# Patient Record
Sex: Female | Born: 1950
Health system: Southern US, Community
[De-identification: ages and names within clinical notes are randomized; demographics above are authoritative.]

## PROBLEM LIST (undated history)

## (undated) DIAGNOSIS — M1990 Unspecified osteoarthritis, unspecified site: Secondary | ICD-10-CM

## (undated) DIAGNOSIS — F32A Depression, unspecified: Secondary | ICD-10-CM

## (undated) DIAGNOSIS — R519 Headache, unspecified: Secondary | ICD-10-CM

## (undated) DIAGNOSIS — K219 Gastro-esophageal reflux disease without esophagitis: Secondary | ICD-10-CM

## (undated) DIAGNOSIS — I10 Essential (primary) hypertension: Secondary | ICD-10-CM

## (undated) DIAGNOSIS — K4090 Unilateral inguinal hernia, without obstruction or gangrene, not specified as recurrent: Secondary | ICD-10-CM

## (undated) DIAGNOSIS — F419 Anxiety disorder, unspecified: Secondary | ICD-10-CM

## (undated) DIAGNOSIS — J449 Chronic obstructive pulmonary disease, unspecified: Secondary | ICD-10-CM

## (undated) DIAGNOSIS — J189 Pneumonia, unspecified organism: Secondary | ICD-10-CM

## (undated) DIAGNOSIS — IMO0002 Reserved for concepts with insufficient information to code with codable children: Secondary | ICD-10-CM

## (undated) DIAGNOSIS — L03119 Cellulitis of unspecified part of limb: Secondary | ICD-10-CM

## (undated) DIAGNOSIS — F329 Major depressive disorder, single episode, unspecified: Secondary | ICD-10-CM

## (undated) DIAGNOSIS — K589 Irritable bowel syndrome without diarrhea: Secondary | ICD-10-CM

## (undated) DIAGNOSIS — E785 Hyperlipidemia, unspecified: Secondary | ICD-10-CM

## (undated) DIAGNOSIS — I739 Peripheral vascular disease, unspecified: Secondary | ICD-10-CM

## (undated) HISTORY — DX: Peripheral vascular disease, unspecified: I73.9

## (undated) HISTORY — DX: Reserved for concepts with insufficient information to code with codable children: IMO0002

## (undated) HISTORY — PX: WRIST SURGERY: SHX841

## (undated) HISTORY — DX: Irritable bowel syndrome, unspecified: K58.9

## (undated) HISTORY — DX: Major depressive disorder, single episode, unspecified: F32.9

## (undated) HISTORY — DX: Essential (primary) hypertension: I10

## (undated) HISTORY — PX: JOINT REPLACEMENT: SHX530

## (undated) HISTORY — DX: Hyperlipidemia, unspecified: E78.5

## (undated) HISTORY — PX: APPENDECTOMY: SHX54

## (undated) HISTORY — DX: Cellulitis of unspecified part of limb: L03.119

## (undated) HISTORY — DX: Depression, unspecified: F32.A

## (undated) HISTORY — DX: Anxiety disorder, unspecified: F41.9

---

## 2013-07-24 ENCOUNTER — Encounter: Payer: Self-pay | Admitting: Vascular Surgery

## 2013-08-24 ENCOUNTER — Encounter: Payer: BC Managed Care – PPO | Admitting: Vascular Surgery

## 2013-10-03 ENCOUNTER — Encounter: Payer: Self-pay | Admitting: Vascular Surgery

## 2013-10-04 ENCOUNTER — Encounter: Payer: BC Managed Care – PPO | Admitting: Vascular Surgery

## 2013-11-14 ENCOUNTER — Encounter: Payer: Self-pay | Admitting: Vascular Surgery

## 2013-11-15 ENCOUNTER — Other Ambulatory Visit: Payer: Self-pay

## 2013-11-15 ENCOUNTER — Ambulatory Visit (INDEPENDENT_AMBULATORY_CARE_PROVIDER_SITE_OTHER): Payer: BC Managed Care – PPO | Admitting: Vascular Surgery

## 2013-11-15 ENCOUNTER — Encounter: Payer: Self-pay | Admitting: *Deleted

## 2013-11-15 ENCOUNTER — Encounter (INDEPENDENT_AMBULATORY_CARE_PROVIDER_SITE_OTHER): Payer: Self-pay

## 2013-11-15 ENCOUNTER — Encounter: Payer: Self-pay | Admitting: Vascular Surgery

## 2013-11-15 VITALS — BP 144/59 | HR 64 | Ht 62.0 in | Wt 96.6 lb

## 2013-11-15 DIAGNOSIS — L98499 Non-pressure chronic ulcer of skin of other sites with unspecified severity: Secondary | ICD-10-CM

## 2013-11-15 DIAGNOSIS — I70209 Unspecified atherosclerosis of native arteries of extremities, unspecified extremity: Secondary | ICD-10-CM | POA: Insufficient documentation

## 2013-11-15 DIAGNOSIS — I739 Peripheral vascular disease, unspecified: Secondary | ICD-10-CM

## 2013-11-15 MED ORDER — OXYCODONE-ACETAMINOPHEN 5-325 MG PO TABS: 1.0000 | ORAL_TABLET | ORAL | Status: DC | PRN

## 2013-11-15 NOTE — Progress Notes (Signed)
 V[facility] and [facility] Specialist of [place]  Patient name: Stacy Case MRN: [id number] DOB: 08/17/1951 Sex: female  REASON FOR CONSULT: Nonhealing wounds of both feet. Referred by Dr. Terry Daniel  HPI: Stacy Case is a 62 y.o. female developed wounds on both feet proximally 6 months ago. She does not remember any specific injury to her feet. Under posterior Achilles area on the left, she developed a blister which progressed into a wound. She has been doing dressing changes for this but has a persistent wound which is quite extensive. She's also had a wound on her left medial malleolus and also her right medial malleolus.  She denies any history of claudication. She denies any history of rest pain.  Her risk factors for peripheral vascular disease include tobacco use, hypertension, and hypercholesterolemia.   Past Medical History  Diagnosis Date  . Peripheral arterial disease   . Hypertension   . Anxiety   . Depression   . Hyperlipidemia   . IBS (irritable bowel syndrome)   . Cellulitis of ankle   . Cystocele    Family History  Problem Relation Age of Onset  . Varicose Veins Mother   . Clotting disorder Mother   . Diabetes Brother   . Hypertension Brother   . Diabetes Daughter   . Hyperlipidemia Daughter   . Hypertension Daughter    SOCIAL HISTORY: History  Substance Use Topics  . Smoking status: Current Every Day Smoker -- 1.00 packs/day for 40 years    Types: Cigarettes  . Smokeless tobacco: Never Used  . Alcohol Use: No   Allergies  Allergen Reactions  . Penicillins Rash   Current Outpatient Prescriptions  Medication Sig Dispense Refill  . amLODipine (NORVASC) 5 MG tablet Take 5 mg by mouth daily.      . aspirin 81 MG tablet Take 81 mg by mouth daily.      . atenolol (TENORMIN) 50 MG tablet Take 50 mg by mouth daily.      . cadexomer iodine (IODOSORB) 0.9 % gel Apply 1 application topically daily as needed for wound care.      . lisinopril-hydrochlorothiazide  (PRINZIDE,ZESTORETIC) 20-12.5 MG per tablet Take 1 tablet by mouth daily.      . PARoxetine (PAXIL) 40 MG tablet Take 40 mg by mouth every morning.      . ranitidine (ZANTAC) 150 MG tablet Take 150 mg by mouth 2 (two) times daily.      . Wound Dressings (SAF-GEL) GEL Apply 1 application topically daily.      . loratadine (CLARITIN) 10 MG tablet Take 10 mg by mouth daily.      . lovastatin (MEVACOR) 20 MG tablet Take 20 mg by mouth at bedtime.      . oxyCODONE-acetaminophen (ROXICET) 5-325 MG per tablet Take 1-2 tablets by mouth every 4 (four) hours as needed for severe pain.  30 tablet  0   No current facility-administered medications for this visit.   REVIEW OF SYSTEMS: [X ] denotes positive finding; [  ] denotes negative finding  CARDIOVASCULAR:  [ ] chest pain   [ ] chest pressure   [ ] palpitations   [ ] orthopnea   [X ] dyspnea on exertion   [ ] claudication   [ ] rest pain   [ ] DVT   [ ] phlebitis PULMONARY:   [ ] productive cough   [ ] asthma   [ ] wheezing NEUROLOGIC:   [ ] weakness  [ ] paresthesias  [ ]   aphasia  [ ] amaurosis  [ ] dizziness HEMATOLOGIC:   [ ] bleeding problems   [ ] clotting disorders MUSCULOSKELETAL:  [ ] joint pain   [ ] joint swelling [ ] leg swelling GASTROINTESTINAL: [ ]  blood in stool  [ ]  hematemesis GENITOURINARY:  [ ]  dysuria  [ ]  hematuria PSYCHIATRIC:  [ ] history of major depression INTEGUMENTARY:  [ ] rashes  [X ] ulcers CONSTITUTIONAL:  [ ] fever   [ ] chills  PHYSICAL EXAM: Filed Vitals:   11/15/13 0911  BP: 144/59  Pulse: 64  Height: 5' 2" (1.575 m)  Weight: 96 lb 9.6 oz (43.817 kg)  SpO2: 96%   Body mass index is 17.66 kg/(m^2). GENERAL: The patient is a well-nourished female, in no acute distress. The vital signs are documented above. CARDIOVASCULAR: There is a regular rate and rhythm. I do not detect carotid bruits. She has palpable radial pulses bilaterally. She has palpable femoral pulses bilaterally. She has a palpable left  dorsalis pedis pulse. I cannot palpate a right dorsalis pedis pulse. I cannot palpate posterior tibial pulses. She has no significant lower extremity swelling. She has biphasic dorsalis pedis signals bilaterally. She has monophasic posterior tibial signals bilaterally. PULMONARY: There is good air exchange bilaterally without wheezing or rales. I do not appreciate an abdominal aortic aneurysm. She has a right lower quadrant hernia. ABDOMEN: Soft and non-tender with normal pitched bowel sounds.  MUSCULOSKELETAL: There are no major deformities or cyanosis. NEUROLOGIC: No focal weakness or paresthesias are detected. SKIN: she has a wound on her left Achilles with exposed tendon it measures 6 cm in width by 13 cm in length. The wound on her left medial malleolus is to a half centimeters in diameter. The wound on her right medial malleolus is 1 cm in diameter. PSYCHIATRIC: The patient has a normal affect.  DATA:  I have reviewed her records from the referring physician's office. She did have an arterial Doppler study on 07/13/2013 which show a mild PVD bilaterally. She was noted to have disease in the left posterior tibial artery that was fairly severe.  I have reviewed her records from [facility]. She has a history of depression, hyperlipidemia, hypertension, irritable bowel syndrome, bronchitis, right inguinal hernia, mixed hyperlipidemia. I do see cholesterol labs from August which showed total cholesterol 205 with an LDL of 97. HDL was 34. Her creatinine is 0.81. Her GFR was 78.  MEDICAL ISSUES:  Atherosclerotic peripheral vascular disease with ulceration This patient has extensive wounds of the left leg especially and also a smaller wound on the right medial malleolus. Based on her exam she has evidence of infrainguinal arterial occlusive disease. She has severe disease in the left posterior tibial artery as noted on a previous Doppler study. This is the artery that supplies the area of  her medial malleolar wound and Achilles area. Therefore, even know she has a palpable dorsalis pedis pulse on the left I think her infrainguinal arterial occlusive disease could be affecting her healing.  I've explained that this is a limb threatening situation given the extent of the wound. We had a long discussion about the importance of tobacco cessation and I've explained how this causes vasospasm of the microcirculation and also affects white blood cell function. Given her the number for [place] to free tobacco cessation program. She'll continue her dressing changes to try to keep the wound a moist. I've recommended we proceed with an arteriogram in   order to further evaluate her infrainguinal arterial occlusive disease. I have reviewed with the patient the indications for arteriography. In addition, I have reviewed the potential complications of arteriography including but not limited to: Bleeding, arterial injury, arterial thrombosis, dye action, renal insufficiency, or other unpredictable medical problems. I have explained to the patient that if we find disease amenable to angioplasty we could potentially address this at the same time. I have discussed the potential complications of angioplasty and stenting, including but not limited to: Bleeding, arterial thrombosis, arterial injury, dissection, or the need for surgical intervention. Her arteriogram is scheduled for 11/20/2013. We will make further recommendations pending these results. Of note I have also given her a prescription for 30 Percocet for pain.   Bricia Taher S [facility] Specialists of [place] [facility]: [phone]    

## 2013-11-15 NOTE — Assessment & Plan Note (Signed)
This patient has extensive wounds of the left leg especially and also a smaller wound on the right medial malleolus. Based on her exam she has evidence of infrainguinal arterial occlusive disease. She has severe disease in the left posterior tibial artery as noted on a previous Doppler study. This is the artery that supplies the area of her medial malleolar wound and Achilles area. Therefore, even know she has a palpable dorsalis pedis pulse on the left I think her infrainguinal arterial occlusive disease could be affecting her healing.  I've explained that this is a limb threatening situation given the extent of the wound. We had a long discussion about the importance of tobacco cessation and I've explained how this causes vasospasm of the microcirculation and also affects white blood cell function. Given her the number for [place] to free tobacco cessation program. She'll continue her dressing changes to try to keep the wound a moist. I've recommended we proceed with an arteriogram in order to further evaluate her infrainguinal arterial occlusive disease. I have reviewed with the patient the indications for arteriography. In addition, I have reviewed the potential complications of arteriography including but not limited to: Bleeding, arterial injury, arterial thrombosis, dye action, renal insufficiency, or other unpredictable medical problems. I have explained to the patient that if we find disease amenable to angioplasty we could potentially address this at the same time. I have discussed the potential complications of angioplasty and stenting, including but not limited to: Bleeding, arterial thrombosis, arterial injury, dissection, or the need for surgical intervention. Her arteriogram is scheduled for 11/20/2013. We will make further recommendations pending these results. Of note I have also given her a prescription for 30 Percocet for pain.

## 2013-11-20 ENCOUNTER — Ambulatory Visit (HOSPITAL_COMMUNITY)
Admission: RE | Admit: 2013-11-20 | Discharge: 2013-11-20 | Disposition: A | Discharge status: Home / Self Care | Payer: BC Managed Care – PPO | Source: Ambulatory Visit | Attending: Vascular Surgery | Admitting: Vascular Surgery

## 2013-11-20 ENCOUNTER — Telehealth: Payer: Self-pay | Admitting: Vascular Surgery

## 2013-11-20 ENCOUNTER — Encounter: Payer: Self-pay | Admitting: Family Medicine

## 2013-11-20 ENCOUNTER — Encounter (HOSPITAL_COMMUNITY)
Admission: RE | Disposition: A | Discharge status: Home / Self Care | Payer: Self-pay | Source: Ambulatory Visit | Attending: Vascular Surgery

## 2013-11-20 DIAGNOSIS — L98499 Non-pressure chronic ulcer of skin of other sites with unspecified severity: Secondary | ICD-10-CM

## 2013-11-20 DIAGNOSIS — I739 Peripheral vascular disease, unspecified: Secondary | ICD-10-CM

## 2013-11-20 DIAGNOSIS — K589 Irritable bowel syndrome without diarrhea: Secondary | ICD-10-CM | POA: Insufficient documentation

## 2013-11-20 DIAGNOSIS — I10 Essential (primary) hypertension: Secondary | ICD-10-CM | POA: Insufficient documentation

## 2013-11-20 DIAGNOSIS — F329 Major depressive disorder, single episode, unspecified: Secondary | ICD-10-CM | POA: Insufficient documentation

## 2013-11-20 DIAGNOSIS — E7800 Pure hypercholesterolemia, unspecified: Secondary | ICD-10-CM | POA: Insufficient documentation

## 2013-11-20 DIAGNOSIS — E782 Mixed hyperlipidemia: Secondary | ICD-10-CM | POA: Insufficient documentation

## 2013-11-20 DIAGNOSIS — L97409 Non-pressure chronic ulcer of unspecified heel and midfoot with unspecified severity: Secondary | ICD-10-CM | POA: Insufficient documentation

## 2013-11-20 DIAGNOSIS — Z7982 Long term (current) use of aspirin: Secondary | ICD-10-CM | POA: Insufficient documentation

## 2013-11-20 DIAGNOSIS — F3289 Other specified depressive episodes: Secondary | ICD-10-CM | POA: Insufficient documentation

## 2013-11-20 DIAGNOSIS — F411 Generalized anxiety disorder: Secondary | ICD-10-CM | POA: Insufficient documentation

## 2013-11-20 DIAGNOSIS — F17200 Nicotine dependence, unspecified, uncomplicated: Secondary | ICD-10-CM | POA: Insufficient documentation

## 2013-11-20 HISTORY — PX: ABDOMINAL AORTAGRAM: SHX5454

## 2013-11-20 LAB — POCT I-STAT, CHEM 8
BUN: 35 mg/dL — AB (ref 6–23)
CALCIUM ION: 1.19 mmol/L (ref 1.13–1.30)
CHLORIDE: 107 meq/L (ref 96–112)
Creatinine, Ser: 1 mg/dL (ref 0.50–1.10)
GLUCOSE: 108 mg/dL — AB (ref 70–99)
HCT: 34 % — ABNORMAL LOW (ref 36.0–46.0)
Hemoglobin: 11.6 g/dL — ABNORMAL LOW (ref 12.0–15.0)
Potassium: 3.3 mEq/L — ABNORMAL LOW (ref 3.7–5.3)
Sodium: 142 mEq/L (ref 137–147)
TCO2: 22 mmol/L (ref 0–100)

## 2013-11-20 SURGERY — ABDOMINAL AORTAGRAM
Anesthesia: LOCAL

## 2013-11-20 MED ORDER — HEPARIN (PORCINE) IN NACL 2-0.9 UNIT/ML-% IJ SOLN
INTRAMUSCULAR | Status: AC
  Filled 2013-11-20: qty 500

## 2013-11-20 MED ORDER — SODIUM CHLORIDE 0.9 % IV SOLN: 1.0000 mL/kg/h | INTRAVENOUS | Status: DC

## 2013-11-20 MED ORDER — ACETAMINOPHEN 325 MG PO TABS: 650.0000 mg | ORAL_TABLET | ORAL | Status: DC | PRN

## 2013-11-20 MED ORDER — LIDOCAINE HCL (PF) 1 % IJ SOLN
INTRAMUSCULAR | Status: AC
  Filled 2013-11-20: qty 30

## 2013-11-20 MED ORDER — MIDAZOLAM HCL 2 MG/2ML IJ SOLN
INTRAMUSCULAR | Status: AC
  Filled 2013-11-20: qty 2

## 2013-11-20 MED ORDER — POTASSIUM CHLORIDE CRYS ER 20 MEQ PO TBCR
20.0000 meq | EXTENDED_RELEASE_TABLET | Freq: Once | ORAL | Status: AC
  Administered 2013-11-20: 20 meq via ORAL
  Filled 2013-11-20: qty 1

## 2013-11-20 MED ORDER — ONDANSETRON HCL 4 MG/2ML IJ SOLN: 4.0000 mg | Freq: Four times a day (QID) | INTRAMUSCULAR | Status: DC | PRN

## 2013-11-20 MED ORDER — SODIUM CHLORIDE 0.9 % IV SOLN
INTRAVENOUS | Status: DC
  Administered 2013-11-20: 100 mL/h via INTRAVENOUS

## 2013-11-20 MED ORDER — FENTANYL CITRATE 0.05 MG/ML IJ SOLN
INTRAMUSCULAR | Status: AC
  Filled 2013-11-20: qty 2

## 2013-11-20 NOTE — Discharge Instructions (Signed)
Groin Site Care °Refer to this sheet in the next few weeks. These instructions provide you with information on caring for yourself after your procedure. Your caregiver may also give you more specific instructions. Your treatment has been planned according to current medical practices, but problems sometimes occur. Call your caregiver if you have any problems or questions after your procedure. °HOME CARE INSTRUCTIONS °· You may shower 24 hours after the procedure. Remove the bandage (dressing) and gently wash the site with plain soap and water. Gently pat the site dry. °· Do not apply powder or lotion to the site. °· Do not sit in a bathtub, swimming pool, or whirlpool for 5 to 7 days. °· No bending, squatting, or lifting anything over 10 pounds (4.5 kg) as directed by your caregiver. °· Inspect the site at least twice daily. °· Do not drive home if you are discharged the same day of the procedure. Have someone else drive you. °· You may drive 24 hours after the procedure unless otherwise instructed by your caregiver. °What to expect: °· Any bruising will usually fade within 1 to 2 weeks. °· Blood that collects in the tissue (hematoma) may be painful to the touch. It should usually decrease in size and tenderness within 1 to 2 weeks. °SEEK IMMEDIATE MEDICAL CARE IF: °· You have unusual pain at the groin site or down the affected leg. °· You have redness, warmth, swelling, or pain at the groin site. °· You have drainage (other than a small amount of blood on the dressing). °· You have chills. °· You have a fever or persistent symptoms for more than 72 hours. °· You have a fever and your symptoms suddenly get worse. °· Your leg becomes pale, cool, tingly, or numb. °· You have heavy bleeding from the site. Hold pressure on the site. °Document Released: 12/05/2010 Document Revised: 01/25/2012 Document Reviewed: 12/05/2010 °ExitCare® Patient Information ©2014 [employer]. ° °

## 2013-11-20 NOTE — Telephone Encounter (Addendum)
Message copied by Gena Fray on Mon Nov 20, 2013  3:43 PM ------      Message from: Alfonso Patten      Created: Mon Nov 20, 2013 11:26 AM      Regarding: FW: charge and f/u                   ----- Message -----         From: Angelia Mould, MD         Sent: 11/20/2013  11:12 AM           To: Patrici Ranks, Alfonso Patten, RN, #      Subject: charge and f/u                                           PROCEDURE:       1. Ultrasound-guided access to the right common femoral artery      2. Aortogram with bilateral iliac arteriogram      3. Selective catheterization of the left external iliac artery with left lower extremity runoff      4. Right femoral arteriogram with right lower extremity runoff            SURGEON: Judeth Cornfield. Scot Dock, MD, FACS            She is to follow up in approximately 3 weeks to follow her left leg wound. Thank you. CD ------  11/20/13- spoke with Leafy Ro to schedule, dpm

## 2013-11-20 NOTE — Op Note (Signed)
   PATIENT: Stacy Case   MRN: [id number] DOB: 1951-07-03    DATE OF PROCEDURE: 11/20/2013  INDICATIONS: Mignon Bechler is a 63 y.o. female presents with an extensive wound of her left Achilles area. She had evidence of posterior tibial artery disease on duplex. She presents for an arteriogram.  PROCEDURE:  1. Ultrasound-guided access to the right common femoral artery 2. Aortogram with bilateral iliac arteriogram 3. Selective catheterization of the left external iliac artery with left lower extremity runoff 4. Right femoral arteriogram with right lower extremity runoff  SURGEON: Judeth Cornfield. Scot Dock, MD, FACS  ANESTHESIA: local with sedation   EBL: minimal   TECHNIQUE: Patient was taken to the peripheral vascular lab and received 25 mcg of fentanyl and a half milligram of Versed. The right groin was prepped and draped in usual sterile fashion. Under ultrasound guidance, after the skin was anesthetized, the right common femoral artery was cannulated and a guidewire introduced into the iliac artery under fluoroscopic control. The micropuncture sheath was exchanged for a 5 [place] sheath over a [employer] wire. The pigtail catheter was positioned at the L1 vertebral body and flush aortogram obtained. The catheter was in position above the aortic bifurcation and oblique iliac projection was obtained. Next the pigtail catheter was exchanged for a crossover catheter which was positioned into the left common iliac artery. An angled Glidewire was advanced into the external iliac artery and the crossover catheter exchange for an endhole catheter. Selective left external iliac arteriogram was obtained with left lower extremity runoff. This catheter was then removed in a retrograde right femoral arteriogram was obtained with right lower extremity runoff. The patient was transferred to the holding area for removal of the sheath.  FINDINGS:  1. There are single renal arteries bilaterally with no significant renal  artery stenosis identified. 2. The infrarenal aorta, bilateral common iliac arteries, bilateral external iliac arteries, and bilateral hypogastric arteries are patent. 3. On the left side, the common femoral, superficial femoral, deep femoral, popliteal, anterior tibial, tibial peroneal trunk, and peroneal arteries are open. The posterior tibial artery on the left is occluded. 4. On the right side, the common femoral, superficial femoral, deep femoral, popliteal, anterior tibial, tibial peroneal trunk and peroneal arteries are patent. The peroneal artery does have some moderate diffuse disease in it's midportion. The right posterior tibial artery is occluded.  Deitra Mayo, MD, FACS [facility]ascular and Vein Specialists of [facility]  DATE OF DICTATION:   11/20/2013

## 2013-11-20 NOTE — Progress Notes (Signed)
DR FIELDS NOTIFIED OF B/P AND NO NEW ORDERS NOTED

## 2013-11-20 NOTE — Interval H&P Note (Signed)
History and Physical Interval Note:  11/20/2013 9:06 AM  Stacy Case  has presented today for surgery, with the diagnosis of PVD  The various methods of treatment have been discussed with the patient and family. After consideration of risks, benefits and other options for treatment, the patient has consented to  Procedure(s): ABDOMINAL AORTAGRAM (N/A) as a surgical intervention .  The patient's history has been reviewed, patient examined, no change in status, stable for surgery.  I have reviewed the patient's chart and labs.  Questions were answered to the patient's satisfaction.     [place] Decoteau S

## 2013-11-20 NOTE — H&P (View-Only) (Signed)
Vascular and Vein Specialist of [facility]  Patient name: Stacy Case MRN: [id number] DOB: 10-24-1951 Sex: female  REASON FOR CONSULT: Nonhealing wounds of both feet. Referred by Dr. Gar Ponto  HPI: Stacy Case is a 63 y.o. female developed wounds on both feet proximally 6 months ago. She does not remember any specific injury to her feet. Under posterior Achilles area on the left, she developed a blister which progressed into a wound. She has been doing dressing changes for this but has a persistent wound which is quite extensive. She's also had a wound on her left medial malleolus and also her right medial malleolus.  She denies any history of claudication. She denies any history of rest pain.  Her risk factors for peripheral vascular disease include tobacco use, hypertension, and hypercholesterolemia.   Past Medical History  Diagnosis Date  . Peripheral arterial disease   . Hypertension   . Anxiety   . Depression   . Hyperlipidemia   . IBS (irritable bowel syndrome)   . Cellulitis of ankle   . Cystocele    Family History  Problem Relation Age of Onset  . Varicose Veins Mother   . Clotting disorder Mother   . Diabetes Brother   . Hypertension Brother   . Diabetes Daughter   . Hyperlipidemia Daughter   . Hypertension Daughter    SOCIAL HISTORY: History  Substance Use Topics  . Smoking status: Current Every Day Smoker -- 1.00 packs/day for 40 years    Types: Cigarettes  . Smokeless tobacco: Never Used  . Alcohol Use: No   Allergies  Allergen Reactions  . Penicillins Rash   Current Outpatient Prescriptions  Medication Sig Dispense Refill  . amLODipine (NORVASC) 5 MG tablet Take 5 mg by mouth daily.      Marland Kitchen aspirin 81 MG tablet Take 81 mg by mouth daily.      Marland Kitchen atenolol (TENORMIN) 50 MG tablet Take 50 mg by mouth daily.      . cadexomer iodine (IODOSORB) 0.9 % gel Apply 1 application topically daily as needed for wound care.      Marland Kitchen lisinopril-hydrochlorothiazide  (PRINZIDE,ZESTORETIC) 20-12.5 MG per tablet Take 1 tablet by mouth daily.      Marland Kitchen PARoxetine (PAXIL) 40 MG tablet Take 40 mg by mouth every morning.      . ranitidine (ZANTAC) 150 MG tablet Take 150 mg by mouth 2 (two) times daily.      . Wound Dressings (SAF-GEL) GEL Apply 1 application topically daily.      Marland Kitchen loratadine (CLARITIN) 10 MG tablet Take 10 mg by mouth daily.      Marland Kitchen lovastatin (MEVACOR) 20 MG tablet Take 20 mg by mouth at bedtime.      Marland Kitchen oxyCODONE-acetaminophen (ROXICET) 5-325 MG per tablet Take 1-2 tablets by mouth every 4 (four) hours as needed for severe pain.  30 tablet  0   No current facility-administered medications for this visit.   REVIEW OF SYSTEMS: Valu.Nieves ] denotes positive finding; [  ] denotes negative finding  CARDIOVASCULAR:  [ ]  chest pain   [ ]  chest pressure   [ ]  palpitations   [ ]  orthopnea   Valu.Nieves ] dyspnea on exertion   [ ]  claudication   [ ]  rest pain   [ ]  DVT   [ ]  phlebitis PULMONARY:   [ ]  productive cough   [ ]  asthma   [ ]  wheezing NEUROLOGIC:   [ ]  weakness  [ ]  paresthesias  [ ]   aphasia  [ ]  amaurosis  [ ]  dizziness HEMATOLOGIC:   [ ]  bleeding problems   [ ]  clotting disorders MUSCULOSKELETAL:  [ ]  joint pain   [ ]  joint swelling [ ]  leg swelling GASTROINTESTINAL: [ ]   blood in stool  [ ]   hematemesis GENITOURINARY:  [ ]   dysuria  [ ]   hematuria PSYCHIATRIC:  [ ]  history of major depression INTEGUMENTARY:  [ ]  rashes  Valu.Nieves ] ulcers CONSTITUTIONAL:  [ ]  fever   [ ]  chills  PHYSICAL EXAM: Filed Vitals:   11/15/13 0911  BP: 144/59  Pulse: 64  Height: 5\' 2"  (1.575 m)  Weight: 96 lb 9.6 oz (43.817 kg)  SpO2: 96%   Body mass index is 17.66 kg/(m^2). GENERAL: The patient is a well-nourished female, in no acute distress. The vital signs are documented above. CARDIOVASCULAR: There is a regular rate and rhythm. I do not detect carotid bruits. She has palpable radial pulses bilaterally. She has palpable femoral pulses bilaterally. She has a palpable left  dorsalis pedis pulse. I cannot palpate a right dorsalis pedis pulse. I cannot palpate posterior tibial pulses. She has no significant lower extremity swelling. She has biphasic dorsalis pedis signals bilaterally. She has monophasic posterior tibial signals bilaterally. PULMONARY: There is good air exchange bilaterally without wheezing or rales. I do not appreciate an abdominal aortic aneurysm. She has a right lower quadrant hernia. ABDOMEN: Soft and non-tender with normal pitched bowel sounds.  MUSCULOSKELETAL: There are no major deformities or cyanosis. NEUROLOGIC: No focal weakness or paresthesias are detected. SKIN: she has a wound on her left Achilles with exposed tendon it measures 6 cm in width by 13 cm in length. The wound on her left medial malleolus is to a half centimeters in diameter. The wound on her right medial malleolus is 1 cm in diameter. PSYCHIATRIC: The patient has a normal affect.  DATA:  I have reviewed her records from the referring physician's office. She did have an arterial Doppler study on 07/13/2013 which show a mild PVD bilaterally. She was noted to have disease in the left posterior tibial artery that was fairly severe.  I have reviewed her records from [place]. She has a history of depression, hyperlipidemia, hypertension, irritable bowel syndrome, bronchitis, right inguinal hernia, mixed hyperlipidemia. I do see cholesterol labs from August which showed total cholesterol 205 with an LDL of 97. HDL was 34. Her creatinine is 0.81. Her GFR was 78.  MEDICAL ISSUES:  Atherosclerotic peripheral vascular disease with ulceration This patient has extensive wounds of the left leg especially and also a smaller wound on the right medial malleolus. Based on her exam she has evidence of infrainguinal arterial occlusive disease. She has severe disease in the left posterior tibial artery as noted on a previous Doppler study. This is the artery that supplies the area of  her medial malleolar wound and Achilles area. Therefore, even know she has a palpable dorsalis pedis pulse on the left I think her infrainguinal arterial occlusive disease could be affecting her healing.  I've explained that this is a limb threatening situation given the extent of the wound. We had a long discussion about the importance of tobacco cessation and I've explained how this causes vasospasm of the microcirculation and also affects white blood cell function. Given her the number for [place] to free tobacco cessation program. She'll continue her dressing changes to try to keep the wound a moist. I've recommended we proceed with an arteriogram in  order to further evaluate her infrainguinal arterial occlusive disease. I have reviewed with the patient the indications for arteriography. In addition, I have reviewed the potential complications of arteriography including but not limited to: Bleeding, arterial injury, arterial thrombosis, dye action, renal insufficiency, or other unpredictable medical problems. I have explained to the patient that if we find disease amenable to angioplasty we could potentially address this at the same time. I have discussed the potential complications of angioplasty and stenting, including but not limited to: Bleeding, arterial thrombosis, arterial injury, dissection, or the need for surgical intervention. Her arteriogram is scheduled for 11/20/2013. We will make further recommendations pending these results. Of note I have also given her a prescription for 30 Percocet for pain.   [place] [facility] of  [place]: [phone]

## 2013-11-20 NOTE — Progress Notes (Signed)
UP AND WALKED AND TOL WELL AND RIGHT GROIN STABLE; NO BLEEDING OR HEMATOMA

## 2013-12-12 ENCOUNTER — Encounter: Payer: Self-pay | Admitting: Vascular Surgery

## 2013-12-13 ENCOUNTER — Ambulatory Visit (INDEPENDENT_AMBULATORY_CARE_PROVIDER_SITE_OTHER): Payer: BC Managed Care – PPO | Admitting: Vascular Surgery

## 2013-12-13 ENCOUNTER — Encounter: Payer: Self-pay | Admitting: Vascular Surgery

## 2013-12-13 VITALS — BP 132/59 | HR 64 | Ht 62.0 in | Wt 93.8 lb

## 2013-12-13 DIAGNOSIS — L98499 Non-pressure chronic ulcer of skin of other sites with unspecified severity: Secondary | ICD-10-CM

## 2013-12-13 DIAGNOSIS — I739 Peripheral vascular disease, unspecified: Secondary | ICD-10-CM

## 2013-12-13 DIAGNOSIS — I70209 Unspecified atherosclerosis of native arteries of extremities, unspecified extremity: Secondary | ICD-10-CM

## 2013-12-13 MED ORDER — OXYCODONE-ACETAMINOPHEN 5-325 MG PO TABS: 1.0000 | ORAL_TABLET | ORAL | Status: DC | PRN

## 2013-12-13 NOTE — Progress Notes (Signed)
   Patient name: Stacy Case MRN: [id number] DOB: [birth year] Sex: female  REASON FOR VISIT: Follow up of left leg wound.  HPI: Stacy Case is a 63 y.o. female who presented with an extensive wound on her left Achilles area. 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 She underwent an arteriogram on 11/20/2013. She comes in for a follow up visit.  Since I saw her last, she states that the wound has improved significantly. She's doing the dressing changes herself. She does have some pain associated with the wound. She does continue to smoke a half pack per day.  He denies fever or chills.  REVIEW OF SYSTEMS: Valu.Nieves ] denotes positive finding; [  ] denotes negative finding  CARDIOVASCULAR:  [ ]  chest pain   [ ]  dyspnea on exertion    CONSTITUTIONAL:  [ ]  fever   [ ]  chills  PHYSICAL EXAM: Filed Vitals:   12/13/13 1043  BP: 132/59  Pulse: 64  Height: 5\' 2"  (1.575 m)  Weight: 93 lb 12.8 oz (42.547 kg)  SpO2: 100%   Body mass index is 17.15 kg/(m^2). GENERAL: The patient is a well-nourished female, in no acute distress. The vital signs are documented above. CARDIOVASCULAR: There is a regular rate and rhythm. She has a left dorsalis pedis pulse. There is no posterior tibial pulse. PULMONARY: There is good air exchange bilaterally without wheezing or rales. The wound on her left Achilles measures 13 cm in length by 5 cm in width. The wound on the medial malleolus measures 2 cm in diameter and appears to have epithelialized.  ARTERIOGRAPHY: Her arteriogram shows that the infrarenal aorta, bilateral common iliac arteries, bilateral external iliac arteries are widely patent. On the left side, which is asymptomatic side, the common femoral, superficial femoral, deep femoral, popliteal, anterior tibial, tibial peroneal trunk, and peroneal arteries are open. Posterior tibial artery is occluded.  MEDICAL ISSUES:  Atherosclerotic peripheral vascular disease with ulceration The  wound on her left Achilles was 6 cm in width and is now 5 cm in width. This is making progress. Based on her arteriogram, she has good perfusion although the posterior tibial artery is occluded. However there is nothing to offer to address this from a vascular standpoint. Her anterior tibial artery is patent as is her peroneal artery. I've instructed her to do the dressing changes with hydrogel to keep the wound moist. In addition we have discussed the importance of getting off tobacco completely. I'll see her back in 3 months. She knows to call sooner if she has problems.   In addition, I have given her a prescription for 30 oxycodone for pain.   [facility] [place] Beeper: [phone]

## 2013-12-13 NOTE — Assessment & Plan Note (Signed)
The wound on her left Achilles was 6 cm in width and is now 5 cm in width. This is making progress. Based on her arteriogram, she has good perfusion although the posterior tibial artery is occluded. However there is nothing to offer to address this from a vascular standpoint. Her anterior tibial artery is patent as is her peroneal artery. I've instructed her to do the dressing changes with hydrogel to keep the wound moist. In addition we have discussed the importance of getting off tobacco completely. I'll see her back in 3 months. She knows to call sooner if she has problems.

## 2014-03-13 ENCOUNTER — Encounter: Payer: Self-pay | Admitting: Vascular Surgery

## 2014-03-14 ENCOUNTER — Ambulatory Visit (INDEPENDENT_AMBULATORY_CARE_PROVIDER_SITE_OTHER): Payer: BC Managed Care – PPO | Admitting: Vascular Surgery

## 2014-03-14 ENCOUNTER — Encounter: Payer: Self-pay | Admitting: Vascular Surgery

## 2014-03-14 VITALS — BP 158/65 | HR 65 | Ht 62.0 in | Wt 102.0 lb

## 2014-03-14 DIAGNOSIS — I739 Peripheral vascular disease, unspecified: Secondary | ICD-10-CM

## 2014-03-14 DIAGNOSIS — I70209 Unspecified atherosclerosis of native arteries of extremities, unspecified extremity: Secondary | ICD-10-CM

## 2014-03-14 DIAGNOSIS — L98499 Non-pressure chronic ulcer of skin of other sites with unspecified severity: Secondary | ICD-10-CM

## 2014-03-14 NOTE — Addendum Note (Signed)
Addended by: Mena Goes on: 03/14/2014 10:40 AM   Modules accepted: Orders

## 2014-03-14 NOTE — Progress Notes (Signed)
[facility]  Patient name: Stacy Case MRN: [id number] DOB: [birth year] Sex: female  REASON FOR VISIT: Follow up of left leg wound.  HPI: Stacy Case is a 62 y.o. female who presented with an extensive wound on her left Achilles area. She underwent an arteriogram on 11/20/2013. Her arteriogram shows that the infrarenal aorta, bilateral common iliac arteries, bilateral external iliac arteries are widely patent. On the left side, which is asymptomatic side, the common femoral, superficial femoral, deep femoral, popliteal, anterior tibial, tibial peroneal trunk, and peroneal arteries are open. Posterior tibial artery is occluded.  I last saw her on 12/13/2013. At that time the wound on her left Achilles has shrunk from 6 cm in width to 5 cm in width. The wound was making progress. There was nothing to do from a vascular standpoint based on her arteriogram. She comes in for a 3 month follow up visit.  Since saw her last, the wound has continued to gradually improve. She denies fever or chills. She continues to smoke a pack per day of cigarettes. She has developed some blisters on her right leg.  Past Medical History  Diagnosis Date  . Peripheral arterial disease   . Hypertension   . Anxiety   . Depression   . Hyperlipidemia   . IBS (irritable bowel syndrome)   . Cellulitis of ankle   . Cystocele    Family History  Problem Relation Age of Onset  . Varicose Veins Mother   . Clotting disorder Mother   . Diabetes Brother   . Hypertension Brother   . Diabetes Daughter   . Hyperlipidemia Daughter   . Hypertension Daughter    SOCIAL HISTORY: History  Substance Use Topics  . Smoking status: Current Every Day Smoker -- 1.00 packs/day for 40 years    Types: Cigarettes  . Smokeless tobacco: Never Used  . Alcohol Use: No   Allergies  Allergen Reactions  . Penicillins Rash   Current Outpatient Prescriptions  Medication Sig Dispense Refill  . amLODipine  (NORVASC) 5 MG tablet Take 5 mg by mouth daily.      Marland Kitchen aspirin 81 MG tablet Take 81 mg by mouth daily.      Marland Kitchen aspirin-acetaminophen-caffeine (EXCEDRIN MIGRAINE) 250-250-65 MG per tablet Take 1 tablet by mouth every 6 (six) hours as needed for headache.      Marland Kitchen atenolol (TENORMIN) 50 MG tablet Take 50 mg by mouth daily.      . cadexomer iodine (IODOSORB) 0.9 % gel Apply 1 application topically daily as needed for wound care.      Marland Kitchen lisinopril-hydrochlorothiazide (PRINZIDE,ZESTORETIC) 20-12.5 MG per tablet Take 1 tablet by mouth daily.      Marland Kitchen loratadine (CLARITIN) 10 MG tablet Take 10 mg by mouth daily.      Marland Kitchen lovastatin (MEVACOR) 20 MG tablet Take 20 mg by mouth at bedtime.      Marland Kitchen oxyCODONE-acetaminophen (ROXICET) 5-325 MG per tablet Take 1-2 tablets by mouth every 4 (four) hours as needed for severe pain.  30 tablet  0  . PARoxetine (PAXIL) 40 MG tablet Take 40 mg by mouth every morning.      . ranitidine (ZANTAC) 150 MG tablet Take 150 mg by mouth 2 (two) times daily.      . Wound Dressings (SAF-GEL) GEL Apply 1 application topically daily.       No current facility-administered medications for this visit.   REVIEW OF SYSTEMS: Valu.Nieves ] denotes positive finding; [  ]  denotes negative finding  CARDIOVASCULAR:  [ ]  chest pain   [ ]  chest pressure   [ ]  palpitations   [ ]  orthopnea   [ ]  dyspnea on exertion   [ ]  claudication   [ ]  rest pain   [ ]  DVT   [ ]  phlebitis PULMONARY:   [ ]  productive cough   [ ]  asthma   [ ]  wheezing NEUROLOGIC:   [ ]  weakness  [ ]  paresthesias  [ ]  aphasia  [ ]  amaurosis  [ ]  dizziness HEMATOLOGIC:   [ ]  bleeding problems   [ ]  clotting disorders MUSCULOSKELETAL:  [ ]  joint pain   [ ]  joint swelling [ ]  leg swelling GASTROINTESTINAL: [ ]   blood in stool  [ ]   hematemesis GENITOURINARY:  [ ]   dysuria  [ ]   hematuria PSYCHIATRIC:  [ ]  history of major depression INTEGUMENTARY:  Valu.Nieves ] rashes  Valu.Nieves ] ulcers CONSTITUTIONAL:  [ ]  fever   [ ]  chills  PHYSICAL EXAM: Filed  Vitals:   03/14/14 1004  BP: 158/65  Pulse: 65  Height: 5\' 2"  (1.575 m)  Weight: 102 lb (46.267 kg)  SpO2: 100%   Body mass index is 18.65 kg/(m^2). GENERAL: The patient is a well-nourished female, in no acute distress. The vital signs are documented above. CARDIOVASCULAR: There is a regular rate and rhythm. I do not detect carotid bruits. She has palpable femoral pulses bilaterally. PULMONARY: There is good air exchange bilaterally without wheezing or rales. ABDOMEN: Soft and non-tender with normal pitched bowel sounds.  MUSCULOSKELETAL: There are no major deformities or cyanosis. NEUROLOGIC: No focal weakness or paresthesias are detected. SKIN: there are multiple blisters and some superficial skin slough on the right leg. The wound on the Achilles on the left leg now measures 12 cm in length by 4-1/2 cm in width and thus is slightly smaller compared to 3 months ago. The wound is epithelializing. PSYCHIATRIC: The patient has a normal affect.  DATA:  I have reviewed her arteriogram which showed the occluded posterior tibial artery on the left side but otherwise no significant occlusive disease. On the right side the right posterior tibial artery was also occluded and the peroneal artery had some moderate diffuse disease but the anterior tibial artery was patent.  MEDICAL ISSUES:  Atherosclerotic peripheral vascular disease with ulceration The wound on her left leg is gradually improving with dressing changes which she does herself. As per review of her arteriogram there are no options for revascularization on the left. She does have good flow through the anterior tibial artery and peroneal arteries however. She will continue with aggressive wound care. We have also again had a long discussion about the importance of tobacco cessation.  Respect to the blisters on her right lower extremity I recommended that she consider evaluation by a dermatologist is this does not appear to be related to  her peripheral vascular disease.  I'll see her back in 6 months and ABIs which I have ordered. She knows to call sooner if she has problems.    Return in about 6 months (around 09/13/2014).   Angelia [facility] [place] Beeper: [phone]

## 2014-03-14 NOTE — Assessment & Plan Note (Signed)
The wound on her left leg is gradually improving with dressing changes which she does herself. As per review of her arteriogram there are no options for revascularization on the left. She does have good flow through the anterior tibial artery and peroneal arteries however. She will continue with aggressive wound care. We have also again had a long discussion about the importance of tobacco cessation.  Respect to the blisters on her right lower extremity I recommended that she consider evaluation by a dermatologist is this does not appear to be related to her peripheral vascular disease.  I'll see her back in 6 months and ABIs which I have ordered. She knows to call sooner if she has problems.

## 2014-09-18 ENCOUNTER — Encounter: Payer: Self-pay | Admitting: Family

## 2014-09-19 ENCOUNTER — Ambulatory Visit: Payer: BC Managed Care – PPO | Admitting: Family

## 2014-09-19 ENCOUNTER — Encounter (HOSPITAL_COMMUNITY): Payer: BC Managed Care – PPO

## 2014-10-25 ENCOUNTER — Encounter (HOSPITAL_COMMUNITY): Payer: Self-pay | Admitting: Vascular Surgery

## 2015-01-03 DIAGNOSIS — D500 Iron deficiency anemia secondary to blood loss (chronic): Secondary | ICD-10-CM | POA: Diagnosis not present

## 2015-03-13 DIAGNOSIS — L03115 Cellulitis of right lower limb: Secondary | ICD-10-CM | POA: Diagnosis not present

## 2015-03-13 DIAGNOSIS — I739 Peripheral vascular disease, unspecified: Secondary | ICD-10-CM | POA: Diagnosis not present

## 2015-03-13 DIAGNOSIS — R197 Diarrhea, unspecified: Secondary | ICD-10-CM | POA: Diagnosis not present

## 2015-03-25 DIAGNOSIS — L03115 Cellulitis of right lower limb: Secondary | ICD-10-CM | POA: Diagnosis not present

## 2015-03-25 DIAGNOSIS — I739 Peripheral vascular disease, unspecified: Secondary | ICD-10-CM | POA: Diagnosis not present

## 2015-03-25 DIAGNOSIS — R110 Nausea: Secondary | ICD-10-CM | POA: Diagnosis not present

## 2015-04-03 DIAGNOSIS — L97519 Non-pressure chronic ulcer of other part of right foot with unspecified severity: Secondary | ICD-10-CM | POA: Diagnosis not present

## 2015-04-03 DIAGNOSIS — S80812S Abrasion, left lower leg, sequela: Secondary | ICD-10-CM | POA: Diagnosis not present

## 2015-04-03 DIAGNOSIS — L97413 Non-pressure chronic ulcer of right heel and midfoot with necrosis of muscle: Secondary | ICD-10-CM | POA: Diagnosis not present

## 2015-04-03 DIAGNOSIS — Z7982 Long term (current) use of aspirin: Secondary | ICD-10-CM | POA: Diagnosis not present

## 2015-04-03 DIAGNOSIS — L97829 Non-pressure chronic ulcer of other part of left lower leg with unspecified severity: Secondary | ICD-10-CM | POA: Diagnosis not present

## 2015-04-03 DIAGNOSIS — Z7902 Long term (current) use of antithrombotics/antiplatelets: Secondary | ICD-10-CM | POA: Diagnosis not present

## 2015-04-03 DIAGNOSIS — F17210 Nicotine dependence, cigarettes, uncomplicated: Secondary | ICD-10-CM | POA: Diagnosis not present

## 2015-04-03 DIAGNOSIS — Z79899 Other long term (current) drug therapy: Secondary | ICD-10-CM | POA: Diagnosis not present

## 2015-04-03 DIAGNOSIS — I70234 Atherosclerosis of native arteries of right leg with ulceration of heel and midfoot: Secondary | ICD-10-CM | POA: Diagnosis not present

## 2015-04-08 ENCOUNTER — Encounter: Payer: Self-pay | Admitting: Vascular Surgery

## 2015-04-09 ENCOUNTER — Encounter: Payer: Self-pay | Admitting: Vascular Surgery

## 2015-04-09 ENCOUNTER — Other Ambulatory Visit: Payer: Self-pay

## 2015-04-09 DIAGNOSIS — L98499 Non-pressure chronic ulcer of skin of other sites with unspecified severity: Secondary | ICD-10-CM

## 2015-04-10 ENCOUNTER — Ambulatory Visit: Payer: Self-pay | Admitting: Vascular Surgery

## 2015-04-10 ENCOUNTER — Encounter (HOSPITAL_COMMUNITY): Payer: Self-pay

## 2015-04-22 DIAGNOSIS — J019 Acute sinusitis, unspecified: Secondary | ICD-10-CM | POA: Diagnosis not present

## 2015-06-10 DIAGNOSIS — J449 Chronic obstructive pulmonary disease, unspecified: Secondary | ICD-10-CM | POA: Diagnosis not present

## 2015-06-10 DIAGNOSIS — F324 Major depressive disorder, single episode, in partial remission: Secondary | ICD-10-CM | POA: Diagnosis not present

## 2015-06-10 DIAGNOSIS — E782 Mixed hyperlipidemia: Secondary | ICD-10-CM | POA: Diagnosis not present

## 2015-06-10 DIAGNOSIS — I10 Essential (primary) hypertension: Secondary | ICD-10-CM | POA: Diagnosis not present

## 2015-06-10 DIAGNOSIS — F411 Generalized anxiety disorder: Secondary | ICD-10-CM | POA: Diagnosis not present

## 2015-06-10 DIAGNOSIS — F17210 Nicotine dependence, cigarettes, uncomplicated: Secondary | ICD-10-CM | POA: Diagnosis not present

## 2015-06-18 DIAGNOSIS — I739 Peripheral vascular disease, unspecified: Secondary | ICD-10-CM | POA: Diagnosis not present

## 2015-06-18 DIAGNOSIS — F324 Major depressive disorder, single episode, in partial remission: Secondary | ICD-10-CM | POA: Diagnosis not present

## 2015-06-18 DIAGNOSIS — I10 Essential (primary) hypertension: Secondary | ICD-10-CM | POA: Diagnosis not present

## 2015-06-18 DIAGNOSIS — F17210 Nicotine dependence, cigarettes, uncomplicated: Secondary | ICD-10-CM | POA: Diagnosis not present

## 2015-06-18 DIAGNOSIS — Z0001 Encounter for general adult medical examination with abnormal findings: Secondary | ICD-10-CM | POA: Diagnosis not present

## 2015-06-18 DIAGNOSIS — Z1389 Encounter for screening for other disorder: Secondary | ICD-10-CM | POA: Diagnosis not present

## 2015-06-18 DIAGNOSIS — J449 Chronic obstructive pulmonary disease, unspecified: Secondary | ICD-10-CM | POA: Diagnosis not present

## 2015-06-18 DIAGNOSIS — K580 Irritable bowel syndrome with diarrhea: Secondary | ICD-10-CM | POA: Diagnosis not present

## 2015-06-18 DIAGNOSIS — E782 Mixed hyperlipidemia: Secondary | ICD-10-CM | POA: Diagnosis not present

## 2015-06-18 DIAGNOSIS — Z23 Encounter for immunization: Secondary | ICD-10-CM | POA: Diagnosis not present

## 2015-06-18 DIAGNOSIS — D649 Anemia, unspecified: Secondary | ICD-10-CM | POA: Diagnosis not present

## 2015-06-18 DIAGNOSIS — F411 Generalized anxiety disorder: Secondary | ICD-10-CM | POA: Diagnosis not present

## 2015-06-25 DIAGNOSIS — Z1231 Encounter for screening mammogram for malignant neoplasm of breast: Secondary | ICD-10-CM | POA: Diagnosis not present

## 2015-07-16 DIAGNOSIS — L03115 Cellulitis of right lower limb: Secondary | ICD-10-CM | POA: Diagnosis not present

## 2015-07-16 DIAGNOSIS — L03116 Cellulitis of left lower limb: Secondary | ICD-10-CM | POA: Diagnosis not present

## 2015-07-24 DIAGNOSIS — L03116 Cellulitis of left lower limb: Secondary | ICD-10-CM | POA: Diagnosis not present

## 2015-07-24 DIAGNOSIS — L03115 Cellulitis of right lower limb: Secondary | ICD-10-CM | POA: Diagnosis not present

## 2015-10-17 DIAGNOSIS — F411 Generalized anxiety disorder: Secondary | ICD-10-CM | POA: Diagnosis not present

## 2015-10-17 DIAGNOSIS — E7800 Pure hypercholesterolemia, unspecified: Secondary | ICD-10-CM | POA: Diagnosis not present

## 2015-10-17 DIAGNOSIS — I10 Essential (primary) hypertension: Secondary | ICD-10-CM | POA: Diagnosis not present

## 2015-10-17 DIAGNOSIS — D649 Anemia, unspecified: Secondary | ICD-10-CM | POA: Diagnosis not present

## 2015-10-17 DIAGNOSIS — J449 Chronic obstructive pulmonary disease, unspecified: Secondary | ICD-10-CM | POA: Diagnosis not present

## 2015-10-17 DIAGNOSIS — E782 Mixed hyperlipidemia: Secondary | ICD-10-CM | POA: Diagnosis not present

## 2015-10-17 DIAGNOSIS — D519 Vitamin B12 deficiency anemia, unspecified: Secondary | ICD-10-CM | POA: Diagnosis not present

## 2015-10-24 DIAGNOSIS — J449 Chronic obstructive pulmonary disease, unspecified: Secondary | ICD-10-CM | POA: Diagnosis not present

## 2015-10-24 DIAGNOSIS — D500 Iron deficiency anemia secondary to blood loss (chronic): Secondary | ICD-10-CM | POA: Diagnosis not present

## 2015-10-24 DIAGNOSIS — I739 Peripheral vascular disease, unspecified: Secondary | ICD-10-CM | POA: Diagnosis not present

## 2015-10-24 DIAGNOSIS — Z23 Encounter for immunization: Secondary | ICD-10-CM | POA: Diagnosis not present

## 2015-10-24 DIAGNOSIS — K580 Irritable bowel syndrome with diarrhea: Secondary | ICD-10-CM | POA: Diagnosis not present

## 2015-10-24 DIAGNOSIS — F17210 Nicotine dependence, cigarettes, uncomplicated: Secondary | ICD-10-CM | POA: Diagnosis not present

## 2015-10-24 DIAGNOSIS — R7301 Impaired fasting glucose: Secondary | ICD-10-CM | POA: Diagnosis not present

## 2015-10-24 DIAGNOSIS — E782 Mixed hyperlipidemia: Secondary | ICD-10-CM | POA: Diagnosis not present

## 2015-10-24 DIAGNOSIS — F331 Major depressive disorder, recurrent, moderate: Secondary | ICD-10-CM | POA: Diagnosis not present

## 2015-10-24 DIAGNOSIS — I10 Essential (primary) hypertension: Secondary | ICD-10-CM | POA: Diagnosis not present

## 2016-02-18 DIAGNOSIS — R7301 Impaired fasting glucose: Secondary | ICD-10-CM | POA: Diagnosis not present

## 2016-02-18 DIAGNOSIS — D649 Anemia, unspecified: Secondary | ICD-10-CM | POA: Diagnosis not present

## 2016-02-18 DIAGNOSIS — I10 Essential (primary) hypertension: Secondary | ICD-10-CM | POA: Diagnosis not present

## 2016-02-18 DIAGNOSIS — E782 Mixed hyperlipidemia: Secondary | ICD-10-CM | POA: Diagnosis not present

## 2016-03-18 DIAGNOSIS — E782 Mixed hyperlipidemia: Secondary | ICD-10-CM | POA: Diagnosis not present

## 2016-03-18 DIAGNOSIS — F17210 Nicotine dependence, cigarettes, uncomplicated: Secondary | ICD-10-CM | POA: Diagnosis not present

## 2016-03-18 DIAGNOSIS — K580 Irritable bowel syndrome with diarrhea: Secondary | ICD-10-CM | POA: Diagnosis not present

## 2016-03-18 DIAGNOSIS — D500 Iron deficiency anemia secondary to blood loss (chronic): Secondary | ICD-10-CM | POA: Diagnosis not present

## 2016-03-18 DIAGNOSIS — R7301 Impaired fasting glucose: Secondary | ICD-10-CM | POA: Diagnosis not present

## 2016-03-18 DIAGNOSIS — I739 Peripheral vascular disease, unspecified: Secondary | ICD-10-CM | POA: Diagnosis not present

## 2016-03-18 DIAGNOSIS — E538 Deficiency of other specified B group vitamins: Secondary | ICD-10-CM | POA: Diagnosis not present

## 2016-03-18 DIAGNOSIS — J449 Chronic obstructive pulmonary disease, unspecified: Secondary | ICD-10-CM | POA: Diagnosis not present

## 2016-03-18 DIAGNOSIS — I10 Essential (primary) hypertension: Secondary | ICD-10-CM | POA: Diagnosis not present

## 2016-03-18 DIAGNOSIS — F331 Major depressive disorder, recurrent, moderate: Secondary | ICD-10-CM | POA: Diagnosis not present

## 2016-04-29 DIAGNOSIS — L03115 Cellulitis of right lower limb: Secondary | ICD-10-CM | POA: Diagnosis not present

## 2016-04-29 DIAGNOSIS — F17210 Nicotine dependence, cigarettes, uncomplicated: Secondary | ICD-10-CM | POA: Diagnosis not present

## 2016-04-29 DIAGNOSIS — L03116 Cellulitis of left lower limb: Secondary | ICD-10-CM | POA: Diagnosis not present

## 2016-04-29 DIAGNOSIS — I739 Peripheral vascular disease, unspecified: Secondary | ICD-10-CM | POA: Diagnosis not present

## 2016-05-10 DIAGNOSIS — R197 Diarrhea, unspecified: Secondary | ICD-10-CM | POA: Diagnosis not present

## 2016-05-10 DIAGNOSIS — K219 Gastro-esophageal reflux disease without esophagitis: Secondary | ICD-10-CM | POA: Diagnosis not present

## 2016-05-10 DIAGNOSIS — Z79899 Other long term (current) drug therapy: Secondary | ICD-10-CM | POA: Diagnosis not present

## 2016-05-10 DIAGNOSIS — E876 Hypokalemia: Secondary | ICD-10-CM | POA: Diagnosis not present

## 2016-05-10 DIAGNOSIS — K922 Gastrointestinal hemorrhage, unspecified: Secondary | ICD-10-CM | POA: Diagnosis not present

## 2016-05-10 DIAGNOSIS — F17200 Nicotine dependence, unspecified, uncomplicated: Secondary | ICD-10-CM | POA: Diagnosis present

## 2016-05-10 DIAGNOSIS — Z7982 Long term (current) use of aspirin: Secondary | ICD-10-CM | POA: Diagnosis not present

## 2016-05-10 DIAGNOSIS — F329 Major depressive disorder, single episode, unspecified: Secondary | ICD-10-CM | POA: Diagnosis present

## 2016-05-10 DIAGNOSIS — I739 Peripheral vascular disease, unspecified: Secondary | ICD-10-CM | POA: Diagnosis not present

## 2016-05-10 DIAGNOSIS — K589 Irritable bowel syndrome without diarrhea: Secondary | ICD-10-CM | POA: Diagnosis present

## 2016-05-10 DIAGNOSIS — K529 Noninfective gastroenteritis and colitis, unspecified: Secondary | ICD-10-CM | POA: Diagnosis not present

## 2016-05-10 DIAGNOSIS — D649 Anemia, unspecified: Secondary | ICD-10-CM | POA: Diagnosis not present

## 2016-05-10 DIAGNOSIS — R1031 Right lower quadrant pain: Secondary | ICD-10-CM | POA: Diagnosis not present

## 2016-05-10 DIAGNOSIS — E860 Dehydration: Secondary | ICD-10-CM | POA: Diagnosis present

## 2016-05-10 DIAGNOSIS — A047 Enterocolitis due to Clostridium difficile: Secondary | ICD-10-CM | POA: Diagnosis present

## 2016-05-10 DIAGNOSIS — I10 Essential (primary) hypertension: Secondary | ICD-10-CM | POA: Diagnosis not present

## 2016-05-25 DIAGNOSIS — A047 Enterocolitis due to Clostridium difficile: Secondary | ICD-10-CM | POA: Diagnosis not present

## 2016-05-25 DIAGNOSIS — E876 Hypokalemia: Secondary | ICD-10-CM | POA: Diagnosis not present

## 2016-05-25 DIAGNOSIS — F17210 Nicotine dependence, cigarettes, uncomplicated: Secondary | ICD-10-CM | POA: Diagnosis not present

## 2016-05-25 DIAGNOSIS — Z681 Body mass index (BMI) 19 or less, adult: Secondary | ICD-10-CM | POA: Diagnosis not present

## 2016-05-25 DIAGNOSIS — D509 Iron deficiency anemia, unspecified: Secondary | ICD-10-CM | POA: Diagnosis not present

## 2016-06-12 DIAGNOSIS — A047 Enterocolitis due to Clostridium difficile: Secondary | ICD-10-CM | POA: Diagnosis not present

## 2016-06-12 DIAGNOSIS — F17210 Nicotine dependence, cigarettes, uncomplicated: Secondary | ICD-10-CM | POA: Diagnosis not present

## 2016-06-12 DIAGNOSIS — D509 Iron deficiency anemia, unspecified: Secondary | ICD-10-CM | POA: Diagnosis not present

## 2016-06-12 DIAGNOSIS — Z681 Body mass index (BMI) 19 or less, adult: Secondary | ICD-10-CM | POA: Diagnosis not present

## 2016-06-12 DIAGNOSIS — E876 Hypokalemia: Secondary | ICD-10-CM | POA: Diagnosis not present

## 2016-07-08 DIAGNOSIS — L03116 Cellulitis of left lower limb: Secondary | ICD-10-CM | POA: Diagnosis not present

## 2016-07-08 DIAGNOSIS — S76012A Strain of muscle, fascia and tendon of left hip, initial encounter: Secondary | ICD-10-CM | POA: Diagnosis not present

## 2016-07-08 DIAGNOSIS — Z681 Body mass index (BMI) 19 or less, adult: Secondary | ICD-10-CM | POA: Diagnosis not present

## 2016-07-22 DIAGNOSIS — Z23 Encounter for immunization: Secondary | ICD-10-CM | POA: Diagnosis not present

## 2016-07-22 DIAGNOSIS — F17210 Nicotine dependence, cigarettes, uncomplicated: Secondary | ICD-10-CM | POA: Diagnosis not present

## 2016-07-22 DIAGNOSIS — M1612 Unilateral primary osteoarthritis, left hip: Secondary | ICD-10-CM | POA: Diagnosis not present

## 2016-07-22 DIAGNOSIS — E782 Mixed hyperlipidemia: Secondary | ICD-10-CM | POA: Diagnosis not present

## 2016-07-22 DIAGNOSIS — I739 Peripheral vascular disease, unspecified: Secondary | ICD-10-CM | POA: Diagnosis not present

## 2016-07-22 DIAGNOSIS — Z681 Body mass index (BMI) 19 or less, adult: Secondary | ICD-10-CM | POA: Diagnosis not present

## 2016-07-22 DIAGNOSIS — J449 Chronic obstructive pulmonary disease, unspecified: Secondary | ICD-10-CM | POA: Diagnosis not present

## 2016-07-22 DIAGNOSIS — M1712 Unilateral primary osteoarthritis, left knee: Secondary | ICD-10-CM | POA: Diagnosis not present

## 2016-08-20 DIAGNOSIS — Z681 Body mass index (BMI) 19 or less, adult: Secondary | ICD-10-CM | POA: Diagnosis not present

## 2016-08-20 DIAGNOSIS — M1612 Unilateral primary osteoarthritis, left hip: Secondary | ICD-10-CM | POA: Diagnosis not present

## 2016-11-19 DIAGNOSIS — R300 Dysuria: Secondary | ICD-10-CM | POA: Diagnosis not present

## 2016-11-19 DIAGNOSIS — D500 Iron deficiency anemia secondary to blood loss (chronic): Secondary | ICD-10-CM | POA: Diagnosis not present

## 2016-11-19 DIAGNOSIS — R7301 Impaired fasting glucose: Secondary | ICD-10-CM | POA: Diagnosis not present

## 2016-11-19 DIAGNOSIS — D649 Anemia, unspecified: Secondary | ICD-10-CM | POA: Diagnosis not present

## 2016-11-19 DIAGNOSIS — E782 Mixed hyperlipidemia: Secondary | ICD-10-CM | POA: Diagnosis not present

## 2016-11-19 DIAGNOSIS — I10 Essential (primary) hypertension: Secondary | ICD-10-CM | POA: Diagnosis not present

## 2016-11-19 DIAGNOSIS — D519 Vitamin B12 deficiency anemia, unspecified: Secondary | ICD-10-CM | POA: Diagnosis not present

## 2016-11-23 DIAGNOSIS — I739 Peripheral vascular disease, unspecified: Secondary | ICD-10-CM | POA: Diagnosis not present

## 2016-11-23 DIAGNOSIS — K580 Irritable bowel syndrome with diarrhea: Secondary | ICD-10-CM | POA: Diagnosis not present

## 2016-11-23 DIAGNOSIS — Z1212 Encounter for screening for malignant neoplasm of rectum: Secondary | ICD-10-CM | POA: Diagnosis not present

## 2016-11-23 DIAGNOSIS — E782 Mixed hyperlipidemia: Secondary | ICD-10-CM | POA: Diagnosis not present

## 2016-11-23 DIAGNOSIS — F17210 Nicotine dependence, cigarettes, uncomplicated: Secondary | ICD-10-CM | POA: Diagnosis not present

## 2016-11-23 DIAGNOSIS — J449 Chronic obstructive pulmonary disease, unspecified: Secondary | ICD-10-CM | POA: Diagnosis not present

## 2016-11-23 DIAGNOSIS — Z681 Body mass index (BMI) 19 or less, adult: Secondary | ICD-10-CM | POA: Diagnosis not present

## 2016-11-23 DIAGNOSIS — I10 Essential (primary) hypertension: Secondary | ICD-10-CM | POA: Diagnosis not present

## 2016-12-25 DIAGNOSIS — I739 Peripheral vascular disease, unspecified: Secondary | ICD-10-CM | POA: Diagnosis not present

## 2016-12-25 DIAGNOSIS — Z1389 Encounter for screening for other disorder: Secondary | ICD-10-CM | POA: Diagnosis not present

## 2016-12-25 DIAGNOSIS — D500 Iron deficiency anemia secondary to blood loss (chronic): Secondary | ICD-10-CM | POA: Diagnosis not present

## 2016-12-25 DIAGNOSIS — K580 Irritable bowel syndrome with diarrhea: Secondary | ICD-10-CM | POA: Diagnosis not present

## 2016-12-25 DIAGNOSIS — F331 Major depressive disorder, recurrent, moderate: Secondary | ICD-10-CM | POA: Diagnosis not present

## 2016-12-25 DIAGNOSIS — E782 Mixed hyperlipidemia: Secondary | ICD-10-CM | POA: Diagnosis not present

## 2016-12-25 DIAGNOSIS — J449 Chronic obstructive pulmonary disease, unspecified: Secondary | ICD-10-CM | POA: Diagnosis not present

## 2016-12-25 DIAGNOSIS — I10 Essential (primary) hypertension: Secondary | ICD-10-CM | POA: Diagnosis not present

## 2017-01-30 DIAGNOSIS — J189 Pneumonia, unspecified organism: Secondary | ICD-10-CM | POA: Diagnosis not present

## 2017-01-30 DIAGNOSIS — I6523 Occlusion and stenosis of bilateral carotid arteries: Secondary | ICD-10-CM | POA: Diagnosis present

## 2017-01-30 DIAGNOSIS — R0989 Other specified symptoms and signs involving the circulatory and respiratory systems: Secondary | ICD-10-CM | POA: Diagnosis not present

## 2017-01-30 DIAGNOSIS — I700 Atherosclerosis of aorta: Secondary | ICD-10-CM | POA: Diagnosis present

## 2017-01-30 DIAGNOSIS — K580 Irritable bowel syndrome with diarrhea: Secondary | ICD-10-CM | POA: Diagnosis present

## 2017-01-30 DIAGNOSIS — L309 Dermatitis, unspecified: Secondary | ICD-10-CM | POA: Diagnosis present

## 2017-01-30 DIAGNOSIS — J0100 Acute maxillary sinusitis, unspecified: Secondary | ICD-10-CM | POA: Diagnosis present

## 2017-01-30 DIAGNOSIS — N390 Urinary tract infection, site not specified: Secondary | ICD-10-CM | POA: Diagnosis not present

## 2017-01-30 DIAGNOSIS — F339 Major depressive disorder, recurrent, unspecified: Secondary | ICD-10-CM | POA: Diagnosis not present

## 2017-01-30 DIAGNOSIS — J156 Pneumonia due to other aerobic Gram-negative bacteria: Secondary | ICD-10-CM | POA: Diagnosis not present

## 2017-01-30 DIAGNOSIS — R945 Abnormal results of liver function studies: Secondary | ICD-10-CM | POA: Diagnosis present

## 2017-01-30 DIAGNOSIS — J0110 Acute frontal sinusitis, unspecified: Secondary | ICD-10-CM | POA: Diagnosis present

## 2017-01-30 DIAGNOSIS — I63311 Cerebral infarction due to thrombosis of right middle cerebral artery: Secondary | ICD-10-CM | POA: Diagnosis not present

## 2017-01-30 DIAGNOSIS — I340 Nonrheumatic mitral (valve) insufficiency: Secondary | ICD-10-CM | POA: Diagnosis present

## 2017-01-30 DIAGNOSIS — M25562 Pain in left knee: Secondary | ICD-10-CM | POA: Diagnosis not present

## 2017-01-30 DIAGNOSIS — J0130 Acute sphenoidal sinusitis, unspecified: Secondary | ICD-10-CM | POA: Diagnosis present

## 2017-01-30 DIAGNOSIS — E43 Unspecified severe protein-calorie malnutrition: Secondary | ICD-10-CM | POA: Diagnosis not present

## 2017-01-30 DIAGNOSIS — R509 Fever, unspecified: Secondary | ICD-10-CM | POA: Diagnosis not present

## 2017-01-30 DIAGNOSIS — M6281 Muscle weakness (generalized): Secondary | ICD-10-CM | POA: Diagnosis not present

## 2017-01-30 DIAGNOSIS — M87852 Other osteonecrosis, left femur: Secondary | ICD-10-CM | POA: Diagnosis not present

## 2017-01-30 DIAGNOSIS — S0091XA Abrasion of unspecified part of head, initial encounter: Secondary | ICD-10-CM | POA: Diagnosis not present

## 2017-01-30 DIAGNOSIS — J449 Chronic obstructive pulmonary disease, unspecified: Secondary | ICD-10-CM | POA: Diagnosis not present

## 2017-01-30 DIAGNOSIS — I10 Essential (primary) hypertension: Secondary | ICD-10-CM | POA: Diagnosis present

## 2017-01-30 DIAGNOSIS — B9620 Unspecified Escherichia coli [E. coli] as the cause of diseases classified elsewhere: Secondary | ICD-10-CM | POA: Diagnosis present

## 2017-01-30 DIAGNOSIS — R2689 Other abnormalities of gait and mobility: Secondary | ICD-10-CM | POA: Diagnosis not present

## 2017-01-30 DIAGNOSIS — D509 Iron deficiency anemia, unspecified: Secondary | ICD-10-CM | POA: Diagnosis present

## 2017-01-30 DIAGNOSIS — Z681 Body mass index (BMI) 19 or less, adult: Secondary | ICD-10-CM | POA: Diagnosis not present

## 2017-01-30 DIAGNOSIS — N3001 Acute cystitis with hematuria: Secondary | ICD-10-CM | POA: Diagnosis not present

## 2017-01-30 DIAGNOSIS — R0902 Hypoxemia: Secondary | ICD-10-CM | POA: Diagnosis present

## 2017-01-30 DIAGNOSIS — I517 Cardiomegaly: Secondary | ICD-10-CM | POA: Diagnosis present

## 2017-01-30 DIAGNOSIS — M25552 Pain in left hip: Secondary | ICD-10-CM | POA: Diagnosis not present

## 2017-01-30 DIAGNOSIS — M25452 Effusion, left hip: Secondary | ICD-10-CM | POA: Diagnosis not present

## 2017-01-30 DIAGNOSIS — K921 Melena: Secondary | ICD-10-CM | POA: Diagnosis not present

## 2017-01-30 DIAGNOSIS — R531 Weakness: Secondary | ICD-10-CM | POA: Diagnosis not present

## 2017-01-30 DIAGNOSIS — R4182 Altered mental status, unspecified: Secondary | ICD-10-CM | POA: Diagnosis not present

## 2017-01-30 DIAGNOSIS — F17210 Nicotine dependence, cigarettes, uncomplicated: Secondary | ICD-10-CM | POA: Diagnosis present

## 2017-01-30 DIAGNOSIS — S0990XA Unspecified injury of head, initial encounter: Secondary | ICD-10-CM | POA: Diagnosis not present

## 2017-01-30 DIAGNOSIS — R000 Tachycardia, unspecified: Secondary | ICD-10-CM | POA: Diagnosis not present

## 2017-01-30 DIAGNOSIS — J019 Acute sinusitis, unspecified: Secondary | ICD-10-CM | POA: Diagnosis not present

## 2017-01-30 DIAGNOSIS — I7789 Other specified disorders of arteries and arterioles: Secondary | ICD-10-CM | POA: Diagnosis present

## 2017-01-30 DIAGNOSIS — E790 Hyperuricemia without signs of inflammatory arthritis and tophaceous disease: Secondary | ICD-10-CM | POA: Diagnosis present

## 2017-01-30 DIAGNOSIS — E785 Hyperlipidemia, unspecified: Secondary | ICD-10-CM | POA: Diagnosis present

## 2017-02-03 DIAGNOSIS — N390 Urinary tract infection, site not specified: Secondary | ICD-10-CM | POA: Diagnosis not present

## 2017-02-03 DIAGNOSIS — F339 Major depressive disorder, recurrent, unspecified: Secondary | ICD-10-CM | POA: Diagnosis not present

## 2017-02-03 DIAGNOSIS — K580 Irritable bowel syndrome with diarrhea: Secondary | ICD-10-CM | POA: Diagnosis not present

## 2017-02-03 DIAGNOSIS — B9620 Unspecified Escherichia coli [E. coli] as the cause of diseases classified elsewhere: Secondary | ICD-10-CM | POA: Diagnosis not present

## 2017-02-03 DIAGNOSIS — I63311 Cerebral infarction due to thrombosis of right middle cerebral artery: Secondary | ICD-10-CM | POA: Diagnosis not present

## 2017-02-03 DIAGNOSIS — J156 Pneumonia due to other aerobic Gram-negative bacteria: Secondary | ICD-10-CM | POA: Diagnosis not present

## 2017-02-03 DIAGNOSIS — M167 Other unilateral secondary osteoarthritis of hip: Secondary | ICD-10-CM | POA: Diagnosis not present

## 2017-02-03 DIAGNOSIS — D509 Iron deficiency anemia, unspecified: Secondary | ICD-10-CM | POA: Diagnosis not present

## 2017-02-03 DIAGNOSIS — E785 Hyperlipidemia, unspecified: Secondary | ICD-10-CM | POA: Diagnosis not present

## 2017-02-03 DIAGNOSIS — I10 Essential (primary) hypertension: Secondary | ICD-10-CM | POA: Diagnosis not present

## 2017-02-03 DIAGNOSIS — J189 Pneumonia, unspecified organism: Secondary | ICD-10-CM | POA: Diagnosis not present

## 2017-02-03 DIAGNOSIS — L309 Dermatitis, unspecified: Secondary | ICD-10-CM | POA: Diagnosis not present

## 2017-02-03 DIAGNOSIS — R2689 Other abnormalities of gait and mobility: Secondary | ICD-10-CM | POA: Diagnosis not present

## 2017-02-03 DIAGNOSIS — J0130 Acute sphenoidal sinusitis, unspecified: Secondary | ICD-10-CM | POA: Diagnosis not present

## 2017-02-03 DIAGNOSIS — J0100 Acute maxillary sinusitis, unspecified: Secondary | ICD-10-CM | POA: Diagnosis not present

## 2017-02-03 DIAGNOSIS — J0110 Acute frontal sinusitis, unspecified: Secondary | ICD-10-CM | POA: Diagnosis not present

## 2017-02-03 DIAGNOSIS — R0902 Hypoxemia: Secondary | ICD-10-CM | POA: Diagnosis not present

## 2017-02-03 DIAGNOSIS — M87852 Other osteonecrosis, left femur: Secondary | ICD-10-CM | POA: Diagnosis not present

## 2017-02-03 DIAGNOSIS — J449 Chronic obstructive pulmonary disease, unspecified: Secondary | ICD-10-CM | POA: Diagnosis not present

## 2017-02-03 DIAGNOSIS — M6281 Muscle weakness (generalized): Secondary | ICD-10-CM | POA: Diagnosis not present

## 2017-02-03 DIAGNOSIS — M879 Osteonecrosis, unspecified: Secondary | ICD-10-CM | POA: Diagnosis not present

## 2017-02-06 DIAGNOSIS — J156 Pneumonia due to other aerobic Gram-negative bacteria: Secondary | ICD-10-CM | POA: Diagnosis not present

## 2017-02-11 DIAGNOSIS — M879 Osteonecrosis, unspecified: Secondary | ICD-10-CM | POA: Diagnosis not present

## 2017-02-11 DIAGNOSIS — M167 Other unilateral secondary osteoarthritis of hip: Secondary | ICD-10-CM | POA: Diagnosis not present

## 2017-02-24 DIAGNOSIS — R509 Fever, unspecified: Secondary | ICD-10-CM | POA: Diagnosis not present

## 2017-02-24 DIAGNOSIS — R197 Diarrhea, unspecified: Secondary | ICD-10-CM | POA: Diagnosis not present

## 2017-02-24 DIAGNOSIS — R261 Paralytic gait: Secondary | ICD-10-CM | POA: Diagnosis not present

## 2017-02-24 DIAGNOSIS — F329 Major depressive disorder, single episode, unspecified: Secondary | ICD-10-CM | POA: Diagnosis not present

## 2017-02-24 DIAGNOSIS — M159 Polyosteoarthritis, unspecified: Secondary | ICD-10-CM | POA: Diagnosis not present

## 2017-02-24 DIAGNOSIS — I10 Essential (primary) hypertension: Secondary | ICD-10-CM | POA: Diagnosis not present

## 2017-02-24 DIAGNOSIS — J449 Chronic obstructive pulmonary disease, unspecified: Secondary | ICD-10-CM | POA: Diagnosis not present

## 2017-02-24 DIAGNOSIS — M6281 Muscle weakness (generalized): Secondary | ICD-10-CM | POA: Diagnosis not present

## 2017-02-24 DIAGNOSIS — Z681 Body mass index (BMI) 19 or less, adult: Secondary | ICD-10-CM | POA: Diagnosis not present

## 2017-02-24 DIAGNOSIS — K0401 Reversible pulpitis: Secondary | ICD-10-CM | POA: Diagnosis not present

## 2017-02-25 DIAGNOSIS — R509 Fever, unspecified: Secondary | ICD-10-CM | POA: Diagnosis not present

## 2017-02-25 DIAGNOSIS — R197 Diarrhea, unspecified: Secondary | ICD-10-CM | POA: Diagnosis not present

## 2017-02-26 DIAGNOSIS — M159 Polyosteoarthritis, unspecified: Secondary | ICD-10-CM | POA: Diagnosis not present

## 2017-02-26 DIAGNOSIS — F329 Major depressive disorder, single episode, unspecified: Secondary | ICD-10-CM | POA: Diagnosis not present

## 2017-02-26 DIAGNOSIS — R261 Paralytic gait: Secondary | ICD-10-CM | POA: Diagnosis not present

## 2017-02-26 DIAGNOSIS — M6281 Muscle weakness (generalized): Secondary | ICD-10-CM | POA: Diagnosis not present

## 2017-02-26 DIAGNOSIS — J449 Chronic obstructive pulmonary disease, unspecified: Secondary | ICD-10-CM | POA: Diagnosis not present

## 2017-02-26 DIAGNOSIS — I10 Essential (primary) hypertension: Secondary | ICD-10-CM | POA: Diagnosis not present

## 2017-03-02 DIAGNOSIS — D500 Iron deficiency anemia secondary to blood loss (chronic): Secondary | ICD-10-CM | POA: Diagnosis not present

## 2017-03-02 DIAGNOSIS — F17210 Nicotine dependence, cigarettes, uncomplicated: Secondary | ICD-10-CM | POA: Diagnosis not present

## 2017-03-02 DIAGNOSIS — E611 Iron deficiency: Secondary | ICD-10-CM | POA: Diagnosis not present

## 2017-03-02 DIAGNOSIS — D519 Vitamin B12 deficiency anemia, unspecified: Secondary | ICD-10-CM | POA: Diagnosis not present

## 2017-03-02 DIAGNOSIS — M1612 Unilateral primary osteoarthritis, left hip: Secondary | ICD-10-CM | POA: Diagnosis not present

## 2017-03-02 DIAGNOSIS — K580 Irritable bowel syndrome with diarrhea: Secondary | ICD-10-CM | POA: Diagnosis not present

## 2017-03-02 DIAGNOSIS — F331 Major depressive disorder, recurrent, moderate: Secondary | ICD-10-CM | POA: Diagnosis not present

## 2017-03-02 DIAGNOSIS — J449 Chronic obstructive pulmonary disease, unspecified: Secondary | ICD-10-CM | POA: Diagnosis not present

## 2017-03-02 DIAGNOSIS — I70201 Unspecified atherosclerosis of native arteries of extremities, right leg: Secondary | ICD-10-CM | POA: Diagnosis not present

## 2017-03-02 DIAGNOSIS — D529 Folate deficiency anemia, unspecified: Secondary | ICD-10-CM | POA: Diagnosis not present

## 2017-03-02 DIAGNOSIS — Z681 Body mass index (BMI) 19 or less, adult: Secondary | ICD-10-CM | POA: Diagnosis not present

## 2017-03-03 DIAGNOSIS — F329 Major depressive disorder, single episode, unspecified: Secondary | ICD-10-CM | POA: Diagnosis not present

## 2017-03-03 DIAGNOSIS — M6281 Muscle weakness (generalized): Secondary | ICD-10-CM | POA: Diagnosis not present

## 2017-03-03 DIAGNOSIS — I10 Essential (primary) hypertension: Secondary | ICD-10-CM | POA: Diagnosis not present

## 2017-03-03 DIAGNOSIS — R261 Paralytic gait: Secondary | ICD-10-CM | POA: Diagnosis not present

## 2017-03-03 DIAGNOSIS — J449 Chronic obstructive pulmonary disease, unspecified: Secondary | ICD-10-CM | POA: Diagnosis not present

## 2017-03-03 DIAGNOSIS — M159 Polyosteoarthritis, unspecified: Secondary | ICD-10-CM | POA: Diagnosis not present

## 2017-03-05 DIAGNOSIS — M6281 Muscle weakness (generalized): Secondary | ICD-10-CM | POA: Diagnosis not present

## 2017-03-05 DIAGNOSIS — I10 Essential (primary) hypertension: Secondary | ICD-10-CM | POA: Diagnosis not present

## 2017-03-05 DIAGNOSIS — F329 Major depressive disorder, single episode, unspecified: Secondary | ICD-10-CM | POA: Diagnosis not present

## 2017-03-05 DIAGNOSIS — M159 Polyosteoarthritis, unspecified: Secondary | ICD-10-CM | POA: Diagnosis not present

## 2017-03-05 DIAGNOSIS — R261 Paralytic gait: Secondary | ICD-10-CM | POA: Diagnosis not present

## 2017-03-05 DIAGNOSIS — J449 Chronic obstructive pulmonary disease, unspecified: Secondary | ICD-10-CM | POA: Diagnosis not present

## 2017-03-08 DIAGNOSIS — F329 Major depressive disorder, single episode, unspecified: Secondary | ICD-10-CM | POA: Diagnosis not present

## 2017-03-08 DIAGNOSIS — R261 Paralytic gait: Secondary | ICD-10-CM | POA: Diagnosis not present

## 2017-03-08 DIAGNOSIS — M6281 Muscle weakness (generalized): Secondary | ICD-10-CM | POA: Diagnosis not present

## 2017-03-08 DIAGNOSIS — M159 Polyosteoarthritis, unspecified: Secondary | ICD-10-CM | POA: Diagnosis not present

## 2017-03-08 DIAGNOSIS — J449 Chronic obstructive pulmonary disease, unspecified: Secondary | ICD-10-CM | POA: Diagnosis not present

## 2017-03-08 DIAGNOSIS — I10 Essential (primary) hypertension: Secondary | ICD-10-CM | POA: Diagnosis not present

## 2017-03-10 DIAGNOSIS — I10 Essential (primary) hypertension: Secondary | ICD-10-CM | POA: Diagnosis not present

## 2017-03-10 DIAGNOSIS — F329 Major depressive disorder, single episode, unspecified: Secondary | ICD-10-CM | POA: Diagnosis not present

## 2017-03-10 DIAGNOSIS — R261 Paralytic gait: Secondary | ICD-10-CM | POA: Diagnosis not present

## 2017-03-10 DIAGNOSIS — J449 Chronic obstructive pulmonary disease, unspecified: Secondary | ICD-10-CM | POA: Diagnosis not present

## 2017-03-10 DIAGNOSIS — M6281 Muscle weakness (generalized): Secondary | ICD-10-CM | POA: Diagnosis not present

## 2017-03-10 DIAGNOSIS — M159 Polyosteoarthritis, unspecified: Secondary | ICD-10-CM | POA: Diagnosis not present

## 2017-03-15 DIAGNOSIS — R261 Paralytic gait: Secondary | ICD-10-CM | POA: Diagnosis not present

## 2017-03-15 DIAGNOSIS — F329 Major depressive disorder, single episode, unspecified: Secondary | ICD-10-CM | POA: Diagnosis not present

## 2017-03-15 DIAGNOSIS — J449 Chronic obstructive pulmonary disease, unspecified: Secondary | ICD-10-CM | POA: Diagnosis not present

## 2017-03-15 DIAGNOSIS — M6281 Muscle weakness (generalized): Secondary | ICD-10-CM | POA: Diagnosis not present

## 2017-03-15 DIAGNOSIS — M159 Polyosteoarthritis, unspecified: Secondary | ICD-10-CM | POA: Diagnosis not present

## 2017-03-15 DIAGNOSIS — I10 Essential (primary) hypertension: Secondary | ICD-10-CM | POA: Diagnosis not present

## 2017-03-18 DIAGNOSIS — M159 Polyosteoarthritis, unspecified: Secondary | ICD-10-CM | POA: Diagnosis not present

## 2017-03-18 DIAGNOSIS — F329 Major depressive disorder, single episode, unspecified: Secondary | ICD-10-CM | POA: Diagnosis not present

## 2017-03-18 DIAGNOSIS — J449 Chronic obstructive pulmonary disease, unspecified: Secondary | ICD-10-CM | POA: Diagnosis not present

## 2017-03-18 DIAGNOSIS — I10 Essential (primary) hypertension: Secondary | ICD-10-CM | POA: Diagnosis not present

## 2017-03-18 DIAGNOSIS — M6281 Muscle weakness (generalized): Secondary | ICD-10-CM | POA: Diagnosis not present

## 2017-03-18 DIAGNOSIS — R261 Paralytic gait: Secondary | ICD-10-CM | POA: Diagnosis not present

## 2017-03-22 DIAGNOSIS — M6281 Muscle weakness (generalized): Secondary | ICD-10-CM | POA: Diagnosis not present

## 2017-03-22 DIAGNOSIS — R261 Paralytic gait: Secondary | ICD-10-CM | POA: Diagnosis not present

## 2017-03-22 DIAGNOSIS — J449 Chronic obstructive pulmonary disease, unspecified: Secondary | ICD-10-CM | POA: Diagnosis not present

## 2017-03-22 DIAGNOSIS — M159 Polyosteoarthritis, unspecified: Secondary | ICD-10-CM | POA: Diagnosis not present

## 2017-03-22 DIAGNOSIS — F329 Major depressive disorder, single episode, unspecified: Secondary | ICD-10-CM | POA: Diagnosis not present

## 2017-03-22 DIAGNOSIS — I10 Essential (primary) hypertension: Secondary | ICD-10-CM | POA: Diagnosis not present

## 2017-03-25 DIAGNOSIS — D500 Iron deficiency anemia secondary to blood loss (chronic): Secondary | ICD-10-CM | POA: Diagnosis not present

## 2017-03-25 DIAGNOSIS — M879 Osteonecrosis, unspecified: Secondary | ICD-10-CM | POA: Diagnosis not present

## 2017-03-25 DIAGNOSIS — F17210 Nicotine dependence, cigarettes, uncomplicated: Secondary | ICD-10-CM | POA: Diagnosis not present

## 2017-03-25 DIAGNOSIS — F331 Major depressive disorder, recurrent, moderate: Secondary | ICD-10-CM | POA: Diagnosis not present

## 2017-03-25 DIAGNOSIS — M1612 Unilateral primary osteoarthritis, left hip: Secondary | ICD-10-CM | POA: Diagnosis not present

## 2017-03-25 DIAGNOSIS — Z681 Body mass index (BMI) 19 or less, adult: Secondary | ICD-10-CM | POA: Diagnosis not present

## 2017-03-25 DIAGNOSIS — K580 Irritable bowel syndrome with diarrhea: Secondary | ICD-10-CM | POA: Diagnosis not present

## 2017-03-25 DIAGNOSIS — J449 Chronic obstructive pulmonary disease, unspecified: Secondary | ICD-10-CM | POA: Diagnosis not present

## 2017-03-25 DIAGNOSIS — M167 Other unilateral secondary osteoarthritis of hip: Secondary | ICD-10-CM | POA: Diagnosis not present

## 2017-03-25 DIAGNOSIS — I70201 Unspecified atherosclerosis of native arteries of extremities, right leg: Secondary | ICD-10-CM | POA: Diagnosis not present

## 2017-03-30 DIAGNOSIS — I10 Essential (primary) hypertension: Secondary | ICD-10-CM | POA: Diagnosis not present

## 2017-03-30 DIAGNOSIS — E876 Hypokalemia: Secondary | ICD-10-CM | POA: Diagnosis not present

## 2017-03-30 DIAGNOSIS — F17210 Nicotine dependence, cigarettes, uncomplicated: Secondary | ICD-10-CM | POA: Diagnosis not present

## 2017-03-30 DIAGNOSIS — D500 Iron deficiency anemia secondary to blood loss (chronic): Secondary | ICD-10-CM | POA: Diagnosis not present

## 2017-03-30 DIAGNOSIS — E7800 Pure hypercholesterolemia, unspecified: Secondary | ICD-10-CM | POA: Diagnosis not present

## 2017-03-30 DIAGNOSIS — E782 Mixed hyperlipidemia: Secondary | ICD-10-CM | POA: Diagnosis not present

## 2017-03-30 DIAGNOSIS — Z9189 Other specified personal risk factors, not elsewhere classified: Secondary | ICD-10-CM | POA: Diagnosis not present

## 2017-03-30 DIAGNOSIS — D510 Vitamin B12 deficiency anemia due to intrinsic factor deficiency: Secondary | ICD-10-CM | POA: Diagnosis not present

## 2017-04-02 DIAGNOSIS — F329 Major depressive disorder, single episode, unspecified: Secondary | ICD-10-CM | POA: Diagnosis not present

## 2017-04-02 DIAGNOSIS — R261 Paralytic gait: Secondary | ICD-10-CM | POA: Diagnosis not present

## 2017-04-02 DIAGNOSIS — I10 Essential (primary) hypertension: Secondary | ICD-10-CM | POA: Diagnosis not present

## 2017-04-02 DIAGNOSIS — M159 Polyosteoarthritis, unspecified: Secondary | ICD-10-CM | POA: Diagnosis not present

## 2017-04-02 DIAGNOSIS — M6281 Muscle weakness (generalized): Secondary | ICD-10-CM | POA: Diagnosis not present

## 2017-04-02 DIAGNOSIS — J449 Chronic obstructive pulmonary disease, unspecified: Secondary | ICD-10-CM | POA: Diagnosis not present

## 2017-04-28 DIAGNOSIS — D529 Folate deficiency anemia, unspecified: Secondary | ICD-10-CM | POA: Diagnosis not present

## 2017-04-28 DIAGNOSIS — D519 Vitamin B12 deficiency anemia, unspecified: Secondary | ICD-10-CM | POA: Diagnosis not present

## 2017-04-28 DIAGNOSIS — D500 Iron deficiency anemia secondary to blood loss (chronic): Secondary | ICD-10-CM | POA: Diagnosis not present

## 2017-04-28 DIAGNOSIS — E539 Vitamin B deficiency, unspecified: Secondary | ICD-10-CM | POA: Diagnosis not present

## 2017-05-17 DIAGNOSIS — J449 Chronic obstructive pulmonary disease, unspecified: Secondary | ICD-10-CM | POA: Diagnosis not present

## 2017-05-17 DIAGNOSIS — I10 Essential (primary) hypertension: Secondary | ICD-10-CM | POA: Diagnosis not present

## 2017-05-17 DIAGNOSIS — I739 Peripheral vascular disease, unspecified: Secondary | ICD-10-CM | POA: Diagnosis not present

## 2017-05-17 DIAGNOSIS — D500 Iron deficiency anemia secondary to blood loss (chronic): Secondary | ICD-10-CM | POA: Diagnosis not present

## 2017-05-17 DIAGNOSIS — E782 Mixed hyperlipidemia: Secondary | ICD-10-CM | POA: Diagnosis not present

## 2017-05-17 DIAGNOSIS — K580 Irritable bowel syndrome with diarrhea: Secondary | ICD-10-CM | POA: Diagnosis not present

## 2017-05-17 DIAGNOSIS — Z0001 Encounter for general adult medical examination with abnormal findings: Secondary | ICD-10-CM | POA: Diagnosis not present

## 2017-05-31 DIAGNOSIS — L03116 Cellulitis of left lower limb: Secondary | ICD-10-CM | POA: Diagnosis not present

## 2017-05-31 DIAGNOSIS — L03115 Cellulitis of right lower limb: Secondary | ICD-10-CM | POA: Diagnosis not present

## 2017-06-02 DIAGNOSIS — L03115 Cellulitis of right lower limb: Secondary | ICD-10-CM | POA: Diagnosis not present

## 2017-06-02 DIAGNOSIS — Z681 Body mass index (BMI) 19 or less, adult: Secondary | ICD-10-CM | POA: Diagnosis not present

## 2017-06-02 DIAGNOSIS — L03116 Cellulitis of left lower limb: Secondary | ICD-10-CM | POA: Diagnosis not present

## 2017-06-07 DIAGNOSIS — L03116 Cellulitis of left lower limb: Secondary | ICD-10-CM | POA: Diagnosis not present

## 2017-06-07 DIAGNOSIS — L03115 Cellulitis of right lower limb: Secondary | ICD-10-CM | POA: Diagnosis not present

## 2017-06-15 DIAGNOSIS — E782 Mixed hyperlipidemia: Secondary | ICD-10-CM | POA: Diagnosis not present

## 2017-06-15 DIAGNOSIS — I10 Essential (primary) hypertension: Secondary | ICD-10-CM | POA: Diagnosis not present

## 2017-06-15 DIAGNOSIS — I739 Peripheral vascular disease, unspecified: Secondary | ICD-10-CM | POA: Diagnosis not present

## 2017-06-15 DIAGNOSIS — J449 Chronic obstructive pulmonary disease, unspecified: Secondary | ICD-10-CM | POA: Diagnosis not present

## 2017-06-15 DIAGNOSIS — F331 Major depressive disorder, recurrent, moderate: Secondary | ICD-10-CM | POA: Diagnosis not present

## 2017-06-15 DIAGNOSIS — D500 Iron deficiency anemia secondary to blood loss (chronic): Secondary | ICD-10-CM | POA: Diagnosis not present

## 2017-06-15 DIAGNOSIS — K580 Irritable bowel syndrome with diarrhea: Secondary | ICD-10-CM | POA: Diagnosis not present

## 2017-06-15 DIAGNOSIS — Z6823 Body mass index (BMI) 23.0-23.9, adult: Secondary | ICD-10-CM | POA: Diagnosis not present

## 2017-06-21 DIAGNOSIS — Z6823 Body mass index (BMI) 23.0-23.9, adult: Secondary | ICD-10-CM | POA: Diagnosis not present

## 2017-06-21 DIAGNOSIS — I872 Venous insufficiency (chronic) (peripheral): Secondary | ICD-10-CM | POA: Diagnosis not present

## 2017-06-24 DIAGNOSIS — F17210 Nicotine dependence, cigarettes, uncomplicated: Secondary | ICD-10-CM | POA: Diagnosis not present

## 2017-06-24 DIAGNOSIS — J449 Chronic obstructive pulmonary disease, unspecified: Secondary | ICD-10-CM | POA: Diagnosis not present

## 2017-06-24 DIAGNOSIS — I10 Essential (primary) hypertension: Secondary | ICD-10-CM | POA: Diagnosis not present

## 2017-06-24 DIAGNOSIS — R238 Other skin changes: Secondary | ICD-10-CM | POA: Diagnosis not present

## 2017-06-24 DIAGNOSIS — G8929 Other chronic pain: Secondary | ICD-10-CM | POA: Diagnosis not present

## 2017-06-24 DIAGNOSIS — I872 Venous insufficiency (chronic) (peripheral): Secondary | ICD-10-CM | POA: Diagnosis not present

## 2017-06-24 DIAGNOSIS — F329 Major depressive disorder, single episode, unspecified: Secondary | ICD-10-CM | POA: Diagnosis not present

## 2017-06-24 DIAGNOSIS — M25552 Pain in left hip: Secondary | ICD-10-CM | POA: Diagnosis not present

## 2017-06-25 DIAGNOSIS — I10 Essential (primary) hypertension: Secondary | ICD-10-CM | POA: Diagnosis not present

## 2017-06-25 DIAGNOSIS — F329 Major depressive disorder, single episode, unspecified: Secondary | ICD-10-CM | POA: Diagnosis not present

## 2017-06-25 DIAGNOSIS — I872 Venous insufficiency (chronic) (peripheral): Secondary | ICD-10-CM | POA: Diagnosis not present

## 2017-06-25 DIAGNOSIS — R238 Other skin changes: Secondary | ICD-10-CM | POA: Diagnosis not present

## 2017-06-25 DIAGNOSIS — M25552 Pain in left hip: Secondary | ICD-10-CM | POA: Diagnosis not present

## 2017-06-25 DIAGNOSIS — G8929 Other chronic pain: Secondary | ICD-10-CM | POA: Diagnosis not present

## 2017-06-28 DIAGNOSIS — R238 Other skin changes: Secondary | ICD-10-CM | POA: Diagnosis not present

## 2017-06-28 DIAGNOSIS — I10 Essential (primary) hypertension: Secondary | ICD-10-CM | POA: Diagnosis not present

## 2017-06-28 DIAGNOSIS — M25552 Pain in left hip: Secondary | ICD-10-CM | POA: Diagnosis not present

## 2017-06-28 DIAGNOSIS — F329 Major depressive disorder, single episode, unspecified: Secondary | ICD-10-CM | POA: Diagnosis not present

## 2017-06-28 DIAGNOSIS — I872 Venous insufficiency (chronic) (peripheral): Secondary | ICD-10-CM | POA: Diagnosis not present

## 2017-06-28 DIAGNOSIS — G8929 Other chronic pain: Secondary | ICD-10-CM | POA: Diagnosis not present

## 2017-07-01 DIAGNOSIS — I10 Essential (primary) hypertension: Secondary | ICD-10-CM | POA: Diagnosis not present

## 2017-07-01 DIAGNOSIS — R238 Other skin changes: Secondary | ICD-10-CM | POA: Diagnosis not present

## 2017-07-01 DIAGNOSIS — I872 Venous insufficiency (chronic) (peripheral): Secondary | ICD-10-CM | POA: Diagnosis not present

## 2017-07-01 DIAGNOSIS — F329 Major depressive disorder, single episode, unspecified: Secondary | ICD-10-CM | POA: Diagnosis not present

## 2017-07-01 DIAGNOSIS — G8929 Other chronic pain: Secondary | ICD-10-CM | POA: Diagnosis not present

## 2017-07-01 DIAGNOSIS — M25552 Pain in left hip: Secondary | ICD-10-CM | POA: Diagnosis not present

## 2017-07-05 DIAGNOSIS — M25552 Pain in left hip: Secondary | ICD-10-CM | POA: Diagnosis not present

## 2017-07-05 DIAGNOSIS — G8929 Other chronic pain: Secondary | ICD-10-CM | POA: Diagnosis not present

## 2017-07-05 DIAGNOSIS — I872 Venous insufficiency (chronic) (peripheral): Secondary | ICD-10-CM | POA: Diagnosis not present

## 2017-07-05 DIAGNOSIS — I10 Essential (primary) hypertension: Secondary | ICD-10-CM | POA: Diagnosis not present

## 2017-07-05 DIAGNOSIS — F329 Major depressive disorder, single episode, unspecified: Secondary | ICD-10-CM | POA: Diagnosis not present

## 2017-07-05 DIAGNOSIS — R238 Other skin changes: Secondary | ICD-10-CM | POA: Diagnosis not present

## 2017-07-06 DIAGNOSIS — I10 Essential (primary) hypertension: Secondary | ICD-10-CM | POA: Diagnosis not present

## 2017-07-06 DIAGNOSIS — I739 Peripheral vascular disease, unspecified: Secondary | ICD-10-CM | POA: Diagnosis not present

## 2017-07-06 DIAGNOSIS — D500 Iron deficiency anemia secondary to blood loss (chronic): Secondary | ICD-10-CM | POA: Diagnosis not present

## 2017-07-06 DIAGNOSIS — E782 Mixed hyperlipidemia: Secondary | ICD-10-CM | POA: Diagnosis not present

## 2017-07-06 DIAGNOSIS — R7301 Impaired fasting glucose: Secondary | ICD-10-CM | POA: Diagnosis not present

## 2017-07-06 DIAGNOSIS — Z6823 Body mass index (BMI) 23.0-23.9, adult: Secondary | ICD-10-CM | POA: Diagnosis not present

## 2017-07-06 DIAGNOSIS — I872 Venous insufficiency (chronic) (peripheral): Secondary | ICD-10-CM | POA: Diagnosis not present

## 2017-07-06 DIAGNOSIS — J449 Chronic obstructive pulmonary disease, unspecified: Secondary | ICD-10-CM | POA: Diagnosis not present

## 2017-07-08 DIAGNOSIS — I10 Essential (primary) hypertension: Secondary | ICD-10-CM | POA: Diagnosis not present

## 2017-07-08 DIAGNOSIS — F329 Major depressive disorder, single episode, unspecified: Secondary | ICD-10-CM | POA: Diagnosis not present

## 2017-07-08 DIAGNOSIS — M25552 Pain in left hip: Secondary | ICD-10-CM | POA: Diagnosis not present

## 2017-07-08 DIAGNOSIS — I872 Venous insufficiency (chronic) (peripheral): Secondary | ICD-10-CM | POA: Diagnosis not present

## 2017-07-08 DIAGNOSIS — R238 Other skin changes: Secondary | ICD-10-CM | POA: Diagnosis not present

## 2017-07-08 DIAGNOSIS — G8929 Other chronic pain: Secondary | ICD-10-CM | POA: Diagnosis not present

## 2017-07-12 DIAGNOSIS — Z22322 Carrier or suspected carrier of Methicillin resistant Staphylococcus aureus: Secondary | ICD-10-CM | POA: Diagnosis not present

## 2017-07-12 DIAGNOSIS — R238 Other skin changes: Secondary | ICD-10-CM | POA: Diagnosis not present

## 2017-07-12 DIAGNOSIS — I10 Essential (primary) hypertension: Secondary | ICD-10-CM | POA: Diagnosis not present

## 2017-07-12 DIAGNOSIS — I872 Venous insufficiency (chronic) (peripheral): Secondary | ICD-10-CM | POA: Diagnosis not present

## 2017-07-12 DIAGNOSIS — M25552 Pain in left hip: Secondary | ICD-10-CM | POA: Diagnosis not present

## 2017-07-12 DIAGNOSIS — Z01812 Encounter for preprocedural laboratory examination: Secondary | ICD-10-CM | POA: Diagnosis not present

## 2017-07-12 DIAGNOSIS — G8929 Other chronic pain: Secondary | ICD-10-CM | POA: Diagnosis not present

## 2017-07-12 DIAGNOSIS — F329 Major depressive disorder, single episode, unspecified: Secondary | ICD-10-CM | POA: Diagnosis not present

## 2017-07-15 DIAGNOSIS — I10 Essential (primary) hypertension: Secondary | ICD-10-CM | POA: Diagnosis not present

## 2017-07-15 DIAGNOSIS — I872 Venous insufficiency (chronic) (peripheral): Secondary | ICD-10-CM | POA: Diagnosis not present

## 2017-07-15 DIAGNOSIS — F329 Major depressive disorder, single episode, unspecified: Secondary | ICD-10-CM | POA: Diagnosis not present

## 2017-07-15 DIAGNOSIS — R238 Other skin changes: Secondary | ICD-10-CM | POA: Diagnosis not present

## 2017-07-15 DIAGNOSIS — M25552 Pain in left hip: Secondary | ICD-10-CM | POA: Diagnosis not present

## 2017-07-15 DIAGNOSIS — G8929 Other chronic pain: Secondary | ICD-10-CM | POA: Diagnosis not present

## 2017-07-20 DIAGNOSIS — F329 Major depressive disorder, single episode, unspecified: Secondary | ICD-10-CM | POA: Diagnosis not present

## 2017-07-20 DIAGNOSIS — R238 Other skin changes: Secondary | ICD-10-CM | POA: Diagnosis not present

## 2017-07-20 DIAGNOSIS — Z79899 Other long term (current) drug therapy: Secondary | ICD-10-CM | POA: Diagnosis not present

## 2017-07-20 DIAGNOSIS — I10 Essential (primary) hypertension: Secondary | ICD-10-CM | POA: Diagnosis not present

## 2017-07-20 DIAGNOSIS — M25552 Pain in left hip: Secondary | ICD-10-CM | POA: Diagnosis not present

## 2017-07-20 DIAGNOSIS — Z862 Personal history of diseases of the blood and blood-forming organs and certain disorders involving the immune mechanism: Secondary | ICD-10-CM | POA: Diagnosis not present

## 2017-07-20 DIAGNOSIS — Z01818 Encounter for other preprocedural examination: Secondary | ICD-10-CM | POA: Diagnosis not present

## 2017-07-20 DIAGNOSIS — G8929 Other chronic pain: Secondary | ICD-10-CM | POA: Diagnosis not present

## 2017-07-20 DIAGNOSIS — M1612 Unilateral primary osteoarthritis, left hip: Secondary | ICD-10-CM | POA: Diagnosis not present

## 2017-07-20 DIAGNOSIS — I872 Venous insufficiency (chronic) (peripheral): Secondary | ICD-10-CM | POA: Diagnosis not present

## 2017-07-20 DIAGNOSIS — Z880 Allergy status to penicillin: Secondary | ICD-10-CM | POA: Diagnosis not present

## 2017-07-20 DIAGNOSIS — K219 Gastro-esophageal reflux disease without esophagitis: Secondary | ICD-10-CM | POA: Diagnosis not present

## 2017-07-21 DIAGNOSIS — M1612 Unilateral primary osteoarthritis, left hip: Secondary | ICD-10-CM | POA: Diagnosis not present

## 2017-07-23 DIAGNOSIS — R238 Other skin changes: Secondary | ICD-10-CM | POA: Diagnosis not present

## 2017-07-23 DIAGNOSIS — G8929 Other chronic pain: Secondary | ICD-10-CM | POA: Diagnosis not present

## 2017-07-23 DIAGNOSIS — I10 Essential (primary) hypertension: Secondary | ICD-10-CM | POA: Diagnosis not present

## 2017-07-23 DIAGNOSIS — I872 Venous insufficiency (chronic) (peripheral): Secondary | ICD-10-CM | POA: Diagnosis not present

## 2017-07-23 DIAGNOSIS — F329 Major depressive disorder, single episode, unspecified: Secondary | ICD-10-CM | POA: Diagnosis not present

## 2017-07-23 DIAGNOSIS — M25552 Pain in left hip: Secondary | ICD-10-CM | POA: Diagnosis not present

## 2017-07-26 DIAGNOSIS — I10 Essential (primary) hypertension: Secondary | ICD-10-CM | POA: Diagnosis not present

## 2017-07-26 DIAGNOSIS — M25552 Pain in left hip: Secondary | ICD-10-CM | POA: Diagnosis not present

## 2017-07-26 DIAGNOSIS — G8929 Other chronic pain: Secondary | ICD-10-CM | POA: Diagnosis not present

## 2017-07-26 DIAGNOSIS — F329 Major depressive disorder, single episode, unspecified: Secondary | ICD-10-CM | POA: Diagnosis not present

## 2017-07-26 DIAGNOSIS — R238 Other skin changes: Secondary | ICD-10-CM | POA: Diagnosis not present

## 2017-07-26 DIAGNOSIS — I872 Venous insufficiency (chronic) (peripheral): Secondary | ICD-10-CM | POA: Diagnosis not present

## 2017-07-27 DIAGNOSIS — Z862 Personal history of diseases of the blood and blood-forming organs and certain disorders involving the immune mechanism: Secondary | ICD-10-CM | POA: Diagnosis not present

## 2017-07-27 DIAGNOSIS — Z79899 Other long term (current) drug therapy: Secondary | ICD-10-CM | POA: Diagnosis not present

## 2017-07-27 DIAGNOSIS — I10 Essential (primary) hypertension: Secondary | ICD-10-CM | POA: Diagnosis not present

## 2017-07-27 DIAGNOSIS — M8788 Other osteonecrosis, other site: Secondary | ICD-10-CM | POA: Diagnosis not present

## 2017-07-27 DIAGNOSIS — K589 Irritable bowel syndrome without diarrhea: Secondary | ICD-10-CM | POA: Diagnosis not present

## 2017-07-27 DIAGNOSIS — Z538 Procedure and treatment not carried out for other reasons: Secondary | ICD-10-CM | POA: Diagnosis not present

## 2017-07-28 DIAGNOSIS — K580 Irritable bowel syndrome with diarrhea: Secondary | ICD-10-CM | POA: Diagnosis not present

## 2017-07-28 DIAGNOSIS — I739 Peripheral vascular disease, unspecified: Secondary | ICD-10-CM | POA: Diagnosis not present

## 2017-07-28 DIAGNOSIS — I10 Essential (primary) hypertension: Secondary | ICD-10-CM | POA: Diagnosis not present

## 2017-07-28 DIAGNOSIS — D500 Iron deficiency anemia secondary to blood loss (chronic): Secondary | ICD-10-CM | POA: Diagnosis not present

## 2017-07-28 DIAGNOSIS — J449 Chronic obstructive pulmonary disease, unspecified: Secondary | ICD-10-CM | POA: Diagnosis not present

## 2017-07-28 DIAGNOSIS — E782 Mixed hyperlipidemia: Secondary | ICD-10-CM | POA: Diagnosis not present

## 2017-07-28 DIAGNOSIS — Z23 Encounter for immunization: Secondary | ICD-10-CM | POA: Diagnosis not present

## 2017-08-04 DIAGNOSIS — E785 Hyperlipidemia, unspecified: Secondary | ICD-10-CM | POA: Diagnosis not present

## 2017-08-04 DIAGNOSIS — F418 Other specified anxiety disorders: Secondary | ICD-10-CM | POA: Diagnosis not present

## 2017-08-04 DIAGNOSIS — I872 Venous insufficiency (chronic) (peripheral): Secondary | ICD-10-CM | POA: Insufficient documentation

## 2017-08-04 DIAGNOSIS — L03116 Cellulitis of left lower limb: Secondary | ICD-10-CM | POA: Diagnosis not present

## 2017-08-04 DIAGNOSIS — I10 Essential (primary) hypertension: Secondary | ICD-10-CM | POA: Diagnosis not present

## 2017-08-04 DIAGNOSIS — I87023 Postthrombotic syndrome with inflammation of bilateral lower extremity: Secondary | ICD-10-CM | POA: Diagnosis not present

## 2017-08-04 DIAGNOSIS — Z87891 Personal history of nicotine dependence: Secondary | ICD-10-CM | POA: Diagnosis not present

## 2017-08-04 DIAGNOSIS — B351 Tinea unguium: Secondary | ICD-10-CM | POA: Diagnosis not present

## 2017-08-04 DIAGNOSIS — Z880 Allergy status to penicillin: Secondary | ICD-10-CM | POA: Diagnosis not present

## 2017-08-04 DIAGNOSIS — I739 Peripheral vascular disease, unspecified: Secondary | ICD-10-CM | POA: Diagnosis not present

## 2017-08-04 DIAGNOSIS — K582 Mixed irritable bowel syndrome: Secondary | ICD-10-CM | POA: Diagnosis not present

## 2017-08-04 DIAGNOSIS — R609 Edema, unspecified: Secondary | ICD-10-CM | POA: Diagnosis not present

## 2017-08-04 DIAGNOSIS — I87093 Postthrombotic syndrome with other complications of bilateral lower extremity: Secondary | ICD-10-CM | POA: Diagnosis not present

## 2017-08-04 DIAGNOSIS — L03115 Cellulitis of right lower limb: Secondary | ICD-10-CM | POA: Diagnosis not present

## 2017-08-04 DIAGNOSIS — Z79899 Other long term (current) drug therapy: Secondary | ICD-10-CM | POA: Diagnosis not present

## 2017-08-04 DIAGNOSIS — M1612 Unilateral primary osteoarthritis, left hip: Secondary | ICD-10-CM | POA: Diagnosis not present

## 2017-08-04 DIAGNOSIS — J449 Chronic obstructive pulmonary disease, unspecified: Secondary | ICD-10-CM | POA: Diagnosis not present

## 2017-08-05 DIAGNOSIS — B351 Tinea unguium: Secondary | ICD-10-CM | POA: Diagnosis not present

## 2017-08-05 DIAGNOSIS — E785 Hyperlipidemia, unspecified: Secondary | ICD-10-CM | POA: Diagnosis not present

## 2017-08-05 DIAGNOSIS — L03116 Cellulitis of left lower limb: Secondary | ICD-10-CM | POA: Diagnosis not present

## 2017-08-05 DIAGNOSIS — I87093 Postthrombotic syndrome with other complications of bilateral lower extremity: Secondary | ICD-10-CM | POA: Diagnosis not present

## 2017-08-05 DIAGNOSIS — L03115 Cellulitis of right lower limb: Secondary | ICD-10-CM | POA: Diagnosis not present

## 2017-08-05 DIAGNOSIS — I739 Peripheral vascular disease, unspecified: Secondary | ICD-10-CM | POA: Diagnosis not present

## 2017-08-09 DIAGNOSIS — M25552 Pain in left hip: Secondary | ICD-10-CM | POA: Diagnosis not present

## 2017-08-09 DIAGNOSIS — R238 Other skin changes: Secondary | ICD-10-CM | POA: Diagnosis not present

## 2017-08-09 DIAGNOSIS — G8929 Other chronic pain: Secondary | ICD-10-CM | POA: Diagnosis not present

## 2017-08-09 DIAGNOSIS — F329 Major depressive disorder, single episode, unspecified: Secondary | ICD-10-CM | POA: Diagnosis not present

## 2017-08-09 DIAGNOSIS — I872 Venous insufficiency (chronic) (peripheral): Secondary | ICD-10-CM | POA: Diagnosis not present

## 2017-08-09 DIAGNOSIS — J449 Chronic obstructive pulmonary disease, unspecified: Secondary | ICD-10-CM | POA: Diagnosis not present

## 2017-08-09 DIAGNOSIS — I10 Essential (primary) hypertension: Secondary | ICD-10-CM | POA: Diagnosis not present

## 2017-08-09 DIAGNOSIS — F17210 Nicotine dependence, cigarettes, uncomplicated: Secondary | ICD-10-CM | POA: Diagnosis not present

## 2017-08-11 DIAGNOSIS — B351 Tinea unguium: Secondary | ICD-10-CM | POA: Diagnosis not present

## 2017-08-11 DIAGNOSIS — I739 Peripheral vascular disease, unspecified: Secondary | ICD-10-CM | POA: Diagnosis not present

## 2017-08-11 DIAGNOSIS — I87023 Postthrombotic syndrome with inflammation of bilateral lower extremity: Secondary | ICD-10-CM | POA: Diagnosis not present

## 2017-08-11 DIAGNOSIS — L03116 Cellulitis of left lower limb: Secondary | ICD-10-CM | POA: Diagnosis not present

## 2017-08-11 DIAGNOSIS — I87093 Postthrombotic syndrome with other complications of bilateral lower extremity: Secondary | ICD-10-CM | POA: Diagnosis not present

## 2017-08-11 DIAGNOSIS — E785 Hyperlipidemia, unspecified: Secondary | ICD-10-CM | POA: Diagnosis not present

## 2017-08-11 DIAGNOSIS — L03115 Cellulitis of right lower limb: Secondary | ICD-10-CM | POA: Diagnosis not present

## 2017-08-12 DIAGNOSIS — M1612 Unilateral primary osteoarthritis, left hip: Secondary | ICD-10-CM | POA: Diagnosis not present

## 2017-08-16 DIAGNOSIS — J449 Chronic obstructive pulmonary disease, unspecified: Secondary | ICD-10-CM | POA: Diagnosis not present

## 2017-08-16 DIAGNOSIS — M1612 Unilateral primary osteoarthritis, left hip: Secondary | ICD-10-CM | POA: Diagnosis not present

## 2017-08-16 DIAGNOSIS — I10 Essential (primary) hypertension: Secondary | ICD-10-CM | POA: Diagnosis not present

## 2017-08-18 DIAGNOSIS — I87003 Postthrombotic syndrome without complications of bilateral lower extremity: Secondary | ICD-10-CM | POA: Diagnosis not present

## 2017-08-18 DIAGNOSIS — I87023 Postthrombotic syndrome with inflammation of bilateral lower extremity: Secondary | ICD-10-CM | POA: Diagnosis not present

## 2017-08-19 DIAGNOSIS — M1612 Unilateral primary osteoarthritis, left hip: Secondary | ICD-10-CM | POA: Diagnosis not present

## 2017-08-19 DIAGNOSIS — M167 Other unilateral secondary osteoarthritis of hip: Secondary | ICD-10-CM | POA: Diagnosis not present

## 2017-08-24 DIAGNOSIS — Z96642 Presence of left artificial hip joint: Secondary | ICD-10-CM | POA: Diagnosis not present

## 2017-08-24 DIAGNOSIS — R630 Anorexia: Secondary | ICD-10-CM | POA: Diagnosis present

## 2017-08-24 DIAGNOSIS — E785 Hyperlipidemia, unspecified: Secondary | ICD-10-CM | POA: Diagnosis present

## 2017-08-24 DIAGNOSIS — Z823 Family history of stroke: Secondary | ICD-10-CM | POA: Diagnosis not present

## 2017-08-24 DIAGNOSIS — K589 Irritable bowel syndrome without diarrhea: Secondary | ICD-10-CM | POA: Diagnosis present

## 2017-08-24 DIAGNOSIS — Z471 Aftercare following joint replacement surgery: Secondary | ICD-10-CM | POA: Diagnosis not present

## 2017-08-24 DIAGNOSIS — R2689 Other abnormalities of gait and mobility: Secondary | ICD-10-CM | POA: Diagnosis not present

## 2017-08-24 DIAGNOSIS — R278 Other lack of coordination: Secondary | ICD-10-CM | POA: Diagnosis not present

## 2017-08-24 DIAGNOSIS — M1612 Unilateral primary osteoarthritis, left hip: Secondary | ICD-10-CM | POA: Diagnosis not present

## 2017-08-24 DIAGNOSIS — Z87891 Personal history of nicotine dependence: Secondary | ICD-10-CM | POA: Diagnosis not present

## 2017-08-24 DIAGNOSIS — J449 Chronic obstructive pulmonary disease, unspecified: Secondary | ICD-10-CM | POA: Diagnosis present

## 2017-08-24 DIAGNOSIS — I10 Essential (primary) hypertension: Secondary | ICD-10-CM | POA: Diagnosis not present

## 2017-08-24 DIAGNOSIS — K219 Gastro-esophageal reflux disease without esophagitis: Secondary | ICD-10-CM | POA: Diagnosis present

## 2017-08-24 DIAGNOSIS — Z880 Allergy status to penicillin: Secondary | ICD-10-CM | POA: Diagnosis not present

## 2017-08-24 DIAGNOSIS — M87852 Other osteonecrosis, left femur: Secondary | ICD-10-CM | POA: Diagnosis not present

## 2017-08-24 DIAGNOSIS — Z79899 Other long term (current) drug therapy: Secondary | ICD-10-CM | POA: Diagnosis not present

## 2017-08-24 DIAGNOSIS — D509 Iron deficiency anemia, unspecified: Secondary | ICD-10-CM | POA: Diagnosis present

## 2017-08-24 DIAGNOSIS — I739 Peripheral vascular disease, unspecified: Secondary | ICD-10-CM | POA: Diagnosis present

## 2017-08-24 DIAGNOSIS — K580 Irritable bowel syndrome with diarrhea: Secondary | ICD-10-CM | POA: Diagnosis not present

## 2017-08-24 DIAGNOSIS — Z833 Family history of diabetes mellitus: Secondary | ICD-10-CM | POA: Diagnosis not present

## 2017-08-24 DIAGNOSIS — E876 Hypokalemia: Secondary | ICD-10-CM | POA: Diagnosis present

## 2017-08-24 DIAGNOSIS — I872 Venous insufficiency (chronic) (peripheral): Secondary | ICD-10-CM | POA: Diagnosis present

## 2017-08-24 DIAGNOSIS — A0472 Enterocolitis due to Clostridium difficile, not specified as recurrent: Secondary | ICD-10-CM | POA: Diagnosis not present

## 2017-08-24 DIAGNOSIS — Z8249 Family history of ischemic heart disease and other diseases of the circulatory system: Secondary | ICD-10-CM | POA: Diagnosis not present

## 2017-08-24 DIAGNOSIS — M6281 Muscle weakness (generalized): Secondary | ICD-10-CM | POA: Diagnosis not present

## 2017-08-24 DIAGNOSIS — Z6825 Body mass index (BMI) 25.0-25.9, adult: Secondary | ICD-10-CM | POA: Diagnosis not present

## 2017-08-24 DIAGNOSIS — F339 Major depressive disorder, recurrent, unspecified: Secondary | ICD-10-CM | POA: Diagnosis present

## 2017-08-24 DIAGNOSIS — N390 Urinary tract infection, site not specified: Secondary | ICD-10-CM | POA: Diagnosis not present

## 2017-08-27 DIAGNOSIS — D72829 Elevated white blood cell count, unspecified: Secondary | ICD-10-CM | POA: Diagnosis not present

## 2017-08-27 DIAGNOSIS — F339 Major depressive disorder, recurrent, unspecified: Secondary | ICD-10-CM | POA: Diagnosis present

## 2017-08-27 DIAGNOSIS — R278 Other lack of coordination: Secondary | ICD-10-CM | POA: Diagnosis not present

## 2017-08-27 DIAGNOSIS — E785 Hyperlipidemia, unspecified: Secondary | ICD-10-CM | POA: Diagnosis present

## 2017-08-27 DIAGNOSIS — R748 Abnormal levels of other serum enzymes: Secondary | ICD-10-CM | POA: Diagnosis present

## 2017-08-27 DIAGNOSIS — R571 Hypovolemic shock: Secondary | ICD-10-CM | POA: Diagnosis present

## 2017-08-27 DIAGNOSIS — K219 Gastro-esophageal reflux disease without esophagitis: Secondary | ICD-10-CM | POA: Diagnosis present

## 2017-08-27 DIAGNOSIS — Z96642 Presence of left artificial hip joint: Secondary | ICD-10-CM | POA: Diagnosis present

## 2017-08-27 DIAGNOSIS — Z79899 Other long term (current) drug therapy: Secondary | ICD-10-CM | POA: Diagnosis not present

## 2017-08-27 DIAGNOSIS — A0472 Enterocolitis due to Clostridium difficile, not specified as recurrent: Secondary | ICD-10-CM | POA: Diagnosis not present

## 2017-08-27 DIAGNOSIS — E860 Dehydration: Secondary | ICD-10-CM | POA: Diagnosis not present

## 2017-08-27 DIAGNOSIS — E876 Hypokalemia: Secondary | ICD-10-CM | POA: Diagnosis present

## 2017-08-27 DIAGNOSIS — N3000 Acute cystitis without hematuria: Secondary | ICD-10-CM | POA: Diagnosis not present

## 2017-08-27 DIAGNOSIS — Z87891 Personal history of nicotine dependence: Secondary | ICD-10-CM | POA: Diagnosis not present

## 2017-08-27 DIAGNOSIS — Z880 Allergy status to penicillin: Secondary | ICD-10-CM | POA: Diagnosis not present

## 2017-08-27 DIAGNOSIS — R42 Dizziness and giddiness: Secondary | ICD-10-CM | POA: Diagnosis not present

## 2017-08-27 DIAGNOSIS — Z833 Family history of diabetes mellitus: Secondary | ICD-10-CM | POA: Diagnosis not present

## 2017-08-27 DIAGNOSIS — K4090 Unilateral inguinal hernia, without obstruction or gangrene, not specified as recurrent: Secondary | ICD-10-CM | POA: Diagnosis not present

## 2017-08-27 DIAGNOSIS — R404 Transient alteration of awareness: Secondary | ICD-10-CM | POA: Diagnosis not present

## 2017-08-27 DIAGNOSIS — R918 Other nonspecific abnormal finding of lung field: Secondary | ICD-10-CM | POA: Diagnosis not present

## 2017-08-27 DIAGNOSIS — D509 Iron deficiency anemia, unspecified: Secondary | ICD-10-CM | POA: Diagnosis present

## 2017-08-27 DIAGNOSIS — Z823 Family history of stroke: Secondary | ICD-10-CM | POA: Diagnosis not present

## 2017-08-27 DIAGNOSIS — I872 Venous insufficiency (chronic) (peripheral): Secondary | ICD-10-CM | POA: Diagnosis present

## 2017-08-27 DIAGNOSIS — K580 Irritable bowel syndrome with diarrhea: Secondary | ICD-10-CM | POA: Diagnosis not present

## 2017-08-27 DIAGNOSIS — N390 Urinary tract infection, site not specified: Secondary | ICD-10-CM | POA: Diagnosis not present

## 2017-08-27 DIAGNOSIS — Z8249 Family history of ischemic heart disease and other diseases of the circulatory system: Secondary | ICD-10-CM | POA: Diagnosis not present

## 2017-08-27 DIAGNOSIS — I739 Peripheral vascular disease, unspecified: Secondary | ICD-10-CM | POA: Diagnosis present

## 2017-08-27 DIAGNOSIS — M6281 Muscle weakness (generalized): Secondary | ICD-10-CM | POA: Diagnosis not present

## 2017-08-27 DIAGNOSIS — D696 Thrombocytopenia, unspecified: Secondary | ICD-10-CM | POA: Diagnosis present

## 2017-08-27 DIAGNOSIS — R2689 Other abnormalities of gait and mobility: Secondary | ICD-10-CM | POA: Diagnosis not present

## 2017-08-27 DIAGNOSIS — E440 Moderate protein-calorie malnutrition: Secondary | ICD-10-CM | POA: Diagnosis present

## 2017-08-27 DIAGNOSIS — Z7982 Long term (current) use of aspirin: Secondary | ICD-10-CM | POA: Diagnosis not present

## 2017-08-27 DIAGNOSIS — Z6825 Body mass index (BMI) 25.0-25.9, adult: Secondary | ICD-10-CM | POA: Diagnosis not present

## 2017-08-27 DIAGNOSIS — K529 Noninfective gastroenteritis and colitis, unspecified: Secondary | ICD-10-CM | POA: Diagnosis not present

## 2017-08-27 DIAGNOSIS — Z471 Aftercare following joint replacement surgery: Secondary | ICD-10-CM | POA: Diagnosis not present

## 2017-08-27 DIAGNOSIS — J449 Chronic obstructive pulmonary disease, unspecified: Secondary | ICD-10-CM | POA: Diagnosis present

## 2017-08-27 DIAGNOSIS — A414 Sepsis due to anaerobes: Secondary | ICD-10-CM | POA: Diagnosis not present

## 2017-08-27 DIAGNOSIS — R0682 Tachypnea, not elsewhere classified: Secondary | ICD-10-CM | POA: Diagnosis not present

## 2017-08-27 DIAGNOSIS — I10 Essential (primary) hypertension: Secondary | ICD-10-CM | POA: Diagnosis not present

## 2017-09-03 DIAGNOSIS — E785 Hyperlipidemia, unspecified: Secondary | ICD-10-CM | POA: Diagnosis present

## 2017-09-03 DIAGNOSIS — R404 Transient alteration of awareness: Secondary | ICD-10-CM | POA: Diagnosis not present

## 2017-09-03 DIAGNOSIS — R918 Other nonspecific abnormal finding of lung field: Secondary | ICD-10-CM | POA: Diagnosis not present

## 2017-09-03 DIAGNOSIS — K529 Noninfective gastroenteritis and colitis, unspecified: Secondary | ICD-10-CM | POA: Diagnosis not present

## 2017-09-03 DIAGNOSIS — J9811 Atelectasis: Secondary | ICD-10-CM | POA: Diagnosis not present

## 2017-09-03 DIAGNOSIS — N3000 Acute cystitis without hematuria: Secondary | ICD-10-CM | POA: Diagnosis not present

## 2017-09-03 DIAGNOSIS — D72829 Elevated white blood cell count, unspecified: Secondary | ICD-10-CM | POA: Diagnosis not present

## 2017-09-03 DIAGNOSIS — D509 Iron deficiency anemia, unspecified: Secondary | ICD-10-CM | POA: Diagnosis present

## 2017-09-03 DIAGNOSIS — Z6825 Body mass index (BMI) 25.0-25.9, adult: Secondary | ICD-10-CM | POA: Diagnosis not present

## 2017-09-03 DIAGNOSIS — Z7401 Bed confinement status: Secondary | ICD-10-CM | POA: Diagnosis not present

## 2017-09-03 DIAGNOSIS — Z471 Aftercare following joint replacement surgery: Secondary | ICD-10-CM | POA: Diagnosis not present

## 2017-09-03 DIAGNOSIS — R279 Unspecified lack of coordination: Secondary | ICD-10-CM | POA: Diagnosis not present

## 2017-09-03 DIAGNOSIS — Z79899 Other long term (current) drug therapy: Secondary | ICD-10-CM | POA: Diagnosis not present

## 2017-09-03 DIAGNOSIS — K8020 Calculus of gallbladder without cholecystitis without obstruction: Secondary | ICD-10-CM | POA: Diagnosis not present

## 2017-09-03 DIAGNOSIS — R748 Abnormal levels of other serum enzymes: Secondary | ICD-10-CM | POA: Diagnosis not present

## 2017-09-03 DIAGNOSIS — Z87891 Personal history of nicotine dependence: Secondary | ICD-10-CM | POA: Diagnosis not present

## 2017-09-03 DIAGNOSIS — E876 Hypokalemia: Secondary | ICD-10-CM | POA: Diagnosis present

## 2017-09-03 DIAGNOSIS — Z8249 Family history of ischemic heart disease and other diseases of the circulatory system: Secondary | ICD-10-CM | POA: Diagnosis not present

## 2017-09-03 DIAGNOSIS — R0682 Tachypnea, not elsewhere classified: Secondary | ICD-10-CM | POA: Diagnosis not present

## 2017-09-03 DIAGNOSIS — I10 Essential (primary) hypertension: Secondary | ICD-10-CM | POA: Diagnosis not present

## 2017-09-03 DIAGNOSIS — E440 Moderate protein-calorie malnutrition: Secondary | ICD-10-CM | POA: Diagnosis not present

## 2017-09-03 DIAGNOSIS — R0602 Shortness of breath: Secondary | ICD-10-CM | POA: Diagnosis not present

## 2017-09-03 DIAGNOSIS — I739 Peripheral vascular disease, unspecified: Secondary | ICD-10-CM | POA: Diagnosis present

## 2017-09-03 DIAGNOSIS — R571 Hypovolemic shock: Secondary | ICD-10-CM | POA: Diagnosis not present

## 2017-09-03 DIAGNOSIS — J449 Chronic obstructive pulmonary disease, unspecified: Secondary | ICD-10-CM | POA: Diagnosis not present

## 2017-09-03 DIAGNOSIS — A414 Sepsis due to anaerobes: Secondary | ICD-10-CM | POA: Diagnosis not present

## 2017-09-03 DIAGNOSIS — E860 Dehydration: Secondary | ICD-10-CM | POA: Diagnosis not present

## 2017-09-03 DIAGNOSIS — Z880 Allergy status to penicillin: Secondary | ICD-10-CM | POA: Diagnosis not present

## 2017-09-03 DIAGNOSIS — R2689 Other abnormalities of gait and mobility: Secondary | ICD-10-CM | POA: Diagnosis not present

## 2017-09-03 DIAGNOSIS — Z823 Family history of stroke: Secondary | ICD-10-CM | POA: Diagnosis not present

## 2017-09-03 DIAGNOSIS — Z96642 Presence of left artificial hip joint: Secondary | ICD-10-CM | POA: Diagnosis present

## 2017-09-03 DIAGNOSIS — K4090 Unilateral inguinal hernia, without obstruction or gangrene, not specified as recurrent: Secondary | ICD-10-CM | POA: Diagnosis not present

## 2017-09-03 DIAGNOSIS — M6281 Muscle weakness (generalized): Secondary | ICD-10-CM | POA: Diagnosis not present

## 2017-09-03 DIAGNOSIS — F339 Major depressive disorder, recurrent, unspecified: Secondary | ICD-10-CM | POA: Diagnosis present

## 2017-09-03 DIAGNOSIS — R42 Dizziness and giddiness: Secondary | ICD-10-CM | POA: Diagnosis not present

## 2017-09-03 DIAGNOSIS — K580 Irritable bowel syndrome with diarrhea: Secondary | ICD-10-CM | POA: Diagnosis present

## 2017-09-03 DIAGNOSIS — A0472 Enterocolitis due to Clostridium difficile, not specified as recurrent: Secondary | ICD-10-CM | POA: Diagnosis not present

## 2017-09-03 DIAGNOSIS — K219 Gastro-esophageal reflux disease without esophagitis: Secondary | ICD-10-CM | POA: Diagnosis present

## 2017-09-03 DIAGNOSIS — R278 Other lack of coordination: Secondary | ICD-10-CM | POA: Diagnosis not present

## 2017-09-03 DIAGNOSIS — Z833 Family history of diabetes mellitus: Secondary | ICD-10-CM | POA: Diagnosis not present

## 2017-09-03 DIAGNOSIS — N390 Urinary tract infection, site not specified: Secondary | ICD-10-CM | POA: Diagnosis not present

## 2017-09-03 DIAGNOSIS — Z7982 Long term (current) use of aspirin: Secondary | ICD-10-CM | POA: Diagnosis not present

## 2017-09-03 DIAGNOSIS — I872 Venous insufficiency (chronic) (peripheral): Secondary | ICD-10-CM | POA: Diagnosis present

## 2017-09-03 DIAGNOSIS — E46 Unspecified protein-calorie malnutrition: Secondary | ICD-10-CM | POA: Diagnosis not present

## 2017-09-03 DIAGNOSIS — D696 Thrombocytopenia, unspecified: Secondary | ICD-10-CM | POA: Diagnosis present

## 2017-09-08 DIAGNOSIS — M1612 Unilateral primary osteoarthritis, left hip: Secondary | ICD-10-CM | POA: Diagnosis not present

## 2017-09-08 DIAGNOSIS — K580 Irritable bowel syndrome with diarrhea: Secondary | ICD-10-CM | POA: Diagnosis not present

## 2017-09-08 DIAGNOSIS — Z7401 Bed confinement status: Secondary | ICD-10-CM | POA: Diagnosis not present

## 2017-09-08 DIAGNOSIS — A0472 Enterocolitis due to Clostridium difficile, not specified as recurrent: Secondary | ICD-10-CM | POA: Diagnosis not present

## 2017-09-08 DIAGNOSIS — R0602 Shortness of breath: Secondary | ICD-10-CM | POA: Diagnosis not present

## 2017-09-08 DIAGNOSIS — Z471 Aftercare following joint replacement surgery: Secondary | ICD-10-CM | POA: Diagnosis not present

## 2017-09-08 DIAGNOSIS — E46 Unspecified protein-calorie malnutrition: Secondary | ICD-10-CM | POA: Diagnosis not present

## 2017-09-08 DIAGNOSIS — M6281 Muscle weakness (generalized): Secondary | ICD-10-CM | POA: Diagnosis not present

## 2017-09-08 DIAGNOSIS — R278 Other lack of coordination: Secondary | ICD-10-CM | POA: Diagnosis not present

## 2017-09-08 DIAGNOSIS — R2689 Other abnormalities of gait and mobility: Secondary | ICD-10-CM | POA: Diagnosis not present

## 2017-09-08 DIAGNOSIS — R279 Unspecified lack of coordination: Secondary | ICD-10-CM | POA: Diagnosis not present

## 2017-09-08 DIAGNOSIS — R571 Hypovolemic shock: Secondary | ICD-10-CM | POA: Diagnosis not present

## 2017-09-08 DIAGNOSIS — Z96642 Presence of left artificial hip joint: Secondary | ICD-10-CM | POA: Diagnosis not present

## 2017-09-08 DIAGNOSIS — J449 Chronic obstructive pulmonary disease, unspecified: Secondary | ICD-10-CM | POA: Diagnosis not present

## 2017-09-08 DIAGNOSIS — I10 Essential (primary) hypertension: Secondary | ICD-10-CM | POA: Diagnosis not present

## 2017-09-12 DIAGNOSIS — A0472 Enterocolitis due to Clostridium difficile, not specified as recurrent: Secondary | ICD-10-CM | POA: Diagnosis not present

## 2017-09-21 DIAGNOSIS — T8149XA Infection following a procedure, other surgical site, initial encounter: Secondary | ICD-10-CM | POA: Insufficient documentation

## 2017-09-21 DIAGNOSIS — M1612 Unilateral primary osteoarthritis, left hip: Secondary | ICD-10-CM | POA: Diagnosis not present

## 2017-10-12 DIAGNOSIS — A0472 Enterocolitis due to Clostridium difficile, not specified as recurrent: Secondary | ICD-10-CM | POA: Diagnosis not present

## 2017-10-22 DIAGNOSIS — J449 Chronic obstructive pulmonary disease, unspecified: Secondary | ICD-10-CM | POA: Diagnosis not present

## 2017-10-22 DIAGNOSIS — K589 Irritable bowel syndrome without diarrhea: Secondary | ICD-10-CM | POA: Diagnosis not present

## 2017-10-22 DIAGNOSIS — E785 Hyperlipidemia, unspecified: Secondary | ICD-10-CM | POA: Diagnosis not present

## 2017-10-22 DIAGNOSIS — I10 Essential (primary) hypertension: Secondary | ICD-10-CM | POA: Diagnosis not present

## 2017-10-22 DIAGNOSIS — M6281 Muscle weakness (generalized): Secondary | ICD-10-CM | POA: Diagnosis not present

## 2017-10-26 DIAGNOSIS — J449 Chronic obstructive pulmonary disease, unspecified: Secondary | ICD-10-CM | POA: Diagnosis not present

## 2017-10-26 DIAGNOSIS — I10 Essential (primary) hypertension: Secondary | ICD-10-CM | POA: Diagnosis not present

## 2017-10-26 DIAGNOSIS — M6281 Muscle weakness (generalized): Secondary | ICD-10-CM | POA: Diagnosis not present

## 2017-10-26 DIAGNOSIS — E785 Hyperlipidemia, unspecified: Secondary | ICD-10-CM | POA: Diagnosis not present

## 2017-10-26 DIAGNOSIS — K589 Irritable bowel syndrome without diarrhea: Secondary | ICD-10-CM | POA: Diagnosis not present

## 2017-10-27 DIAGNOSIS — M6281 Muscle weakness (generalized): Secondary | ICD-10-CM | POA: Diagnosis not present

## 2017-10-27 DIAGNOSIS — J449 Chronic obstructive pulmonary disease, unspecified: Secondary | ICD-10-CM | POA: Diagnosis not present

## 2017-10-27 DIAGNOSIS — E785 Hyperlipidemia, unspecified: Secondary | ICD-10-CM | POA: Diagnosis not present

## 2017-10-27 DIAGNOSIS — I10 Essential (primary) hypertension: Secondary | ICD-10-CM | POA: Diagnosis not present

## 2017-10-27 DIAGNOSIS — K589 Irritable bowel syndrome without diarrhea: Secondary | ICD-10-CM | POA: Diagnosis not present

## 2017-10-28 DIAGNOSIS — E785 Hyperlipidemia, unspecified: Secondary | ICD-10-CM | POA: Diagnosis not present

## 2017-10-28 DIAGNOSIS — K589 Irritable bowel syndrome without diarrhea: Secondary | ICD-10-CM | POA: Diagnosis not present

## 2017-10-28 DIAGNOSIS — M1612 Unilateral primary osteoarthritis, left hip: Secondary | ICD-10-CM | POA: Diagnosis not present

## 2017-10-28 DIAGNOSIS — J449 Chronic obstructive pulmonary disease, unspecified: Secondary | ICD-10-CM | POA: Diagnosis not present

## 2017-10-28 DIAGNOSIS — I10 Essential (primary) hypertension: Secondary | ICD-10-CM | POA: Diagnosis not present

## 2017-10-28 DIAGNOSIS — M6281 Muscle weakness (generalized): Secondary | ICD-10-CM | POA: Diagnosis not present

## 2017-11-01 DIAGNOSIS — K589 Irritable bowel syndrome without diarrhea: Secondary | ICD-10-CM | POA: Diagnosis not present

## 2017-11-01 DIAGNOSIS — M6281 Muscle weakness (generalized): Secondary | ICD-10-CM | POA: Diagnosis not present

## 2017-11-01 DIAGNOSIS — I10 Essential (primary) hypertension: Secondary | ICD-10-CM | POA: Diagnosis not present

## 2017-11-01 DIAGNOSIS — E785 Hyperlipidemia, unspecified: Secondary | ICD-10-CM | POA: Diagnosis not present

## 2017-11-01 DIAGNOSIS — J449 Chronic obstructive pulmonary disease, unspecified: Secondary | ICD-10-CM | POA: Diagnosis not present

## 2017-11-02 DIAGNOSIS — K589 Irritable bowel syndrome without diarrhea: Secondary | ICD-10-CM | POA: Diagnosis not present

## 2017-11-02 DIAGNOSIS — J449 Chronic obstructive pulmonary disease, unspecified: Secondary | ICD-10-CM | POA: Diagnosis not present

## 2017-11-02 DIAGNOSIS — I10 Essential (primary) hypertension: Secondary | ICD-10-CM | POA: Diagnosis not present

## 2017-11-02 DIAGNOSIS — E785 Hyperlipidemia, unspecified: Secondary | ICD-10-CM | POA: Diagnosis not present

## 2017-11-02 DIAGNOSIS — M6281 Muscle weakness (generalized): Secondary | ICD-10-CM | POA: Diagnosis not present

## 2017-11-03 DIAGNOSIS — E785 Hyperlipidemia, unspecified: Secondary | ICD-10-CM | POA: Diagnosis not present

## 2017-11-03 DIAGNOSIS — I10 Essential (primary) hypertension: Secondary | ICD-10-CM | POA: Diagnosis not present

## 2017-11-03 DIAGNOSIS — M6281 Muscle weakness (generalized): Secondary | ICD-10-CM | POA: Diagnosis not present

## 2017-11-03 DIAGNOSIS — K589 Irritable bowel syndrome without diarrhea: Secondary | ICD-10-CM | POA: Diagnosis not present

## 2017-11-03 DIAGNOSIS — J449 Chronic obstructive pulmonary disease, unspecified: Secondary | ICD-10-CM | POA: Diagnosis not present

## 2017-11-10 DIAGNOSIS — J449 Chronic obstructive pulmonary disease, unspecified: Secondary | ICD-10-CM | POA: Diagnosis not present

## 2017-11-10 DIAGNOSIS — I10 Essential (primary) hypertension: Secondary | ICD-10-CM | POA: Diagnosis not present

## 2017-11-10 DIAGNOSIS — K589 Irritable bowel syndrome without diarrhea: Secondary | ICD-10-CM | POA: Diagnosis not present

## 2017-11-10 DIAGNOSIS — E785 Hyperlipidemia, unspecified: Secondary | ICD-10-CM | POA: Diagnosis not present

## 2017-11-10 DIAGNOSIS — M6281 Muscle weakness (generalized): Secondary | ICD-10-CM | POA: Diagnosis not present

## 2017-11-11 DIAGNOSIS — K589 Irritable bowel syndrome without diarrhea: Secondary | ICD-10-CM | POA: Diagnosis not present

## 2017-11-11 DIAGNOSIS — I10 Essential (primary) hypertension: Secondary | ICD-10-CM | POA: Diagnosis not present

## 2017-11-11 DIAGNOSIS — J449 Chronic obstructive pulmonary disease, unspecified: Secondary | ICD-10-CM | POA: Diagnosis not present

## 2017-11-11 DIAGNOSIS — E785 Hyperlipidemia, unspecified: Secondary | ICD-10-CM | POA: Diagnosis not present

## 2017-11-11 DIAGNOSIS — M6281 Muscle weakness (generalized): Secondary | ICD-10-CM | POA: Diagnosis not present

## 2017-11-12 DIAGNOSIS — E785 Hyperlipidemia, unspecified: Secondary | ICD-10-CM | POA: Diagnosis not present

## 2017-11-12 DIAGNOSIS — M6281 Muscle weakness (generalized): Secondary | ICD-10-CM | POA: Diagnosis not present

## 2017-11-12 DIAGNOSIS — J449 Chronic obstructive pulmonary disease, unspecified: Secondary | ICD-10-CM | POA: Diagnosis not present

## 2017-11-12 DIAGNOSIS — I10 Essential (primary) hypertension: Secondary | ICD-10-CM | POA: Diagnosis not present

## 2017-11-12 DIAGNOSIS — K589 Irritable bowel syndrome without diarrhea: Secondary | ICD-10-CM | POA: Diagnosis not present

## 2017-11-17 DIAGNOSIS — I10 Essential (primary) hypertension: Secondary | ICD-10-CM | POA: Diagnosis not present

## 2017-11-17 DIAGNOSIS — K589 Irritable bowel syndrome without diarrhea: Secondary | ICD-10-CM | POA: Diagnosis not present

## 2017-11-17 DIAGNOSIS — M6281 Muscle weakness (generalized): Secondary | ICD-10-CM | POA: Diagnosis not present

## 2017-11-17 DIAGNOSIS — J449 Chronic obstructive pulmonary disease, unspecified: Secondary | ICD-10-CM | POA: Diagnosis not present

## 2017-11-17 DIAGNOSIS — E785 Hyperlipidemia, unspecified: Secondary | ICD-10-CM | POA: Diagnosis not present

## 2017-11-18 DIAGNOSIS — M6281 Muscle weakness (generalized): Secondary | ICD-10-CM | POA: Diagnosis not present

## 2017-11-18 DIAGNOSIS — E785 Hyperlipidemia, unspecified: Secondary | ICD-10-CM | POA: Diagnosis not present

## 2017-11-18 DIAGNOSIS — I10 Essential (primary) hypertension: Secondary | ICD-10-CM | POA: Diagnosis not present

## 2017-11-18 DIAGNOSIS — K589 Irritable bowel syndrome without diarrhea: Secondary | ICD-10-CM | POA: Diagnosis not present

## 2017-11-18 DIAGNOSIS — J449 Chronic obstructive pulmonary disease, unspecified: Secondary | ICD-10-CM | POA: Diagnosis not present

## 2017-11-20 DIAGNOSIS — M1612 Unilateral primary osteoarthritis, left hip: Secondary | ICD-10-CM | POA: Diagnosis not present

## 2017-11-20 DIAGNOSIS — K580 Irritable bowel syndrome with diarrhea: Secondary | ICD-10-CM | POA: Diagnosis not present

## 2017-11-20 DIAGNOSIS — Z6825 Body mass index (BMI) 25.0-25.9, adult: Secondary | ICD-10-CM | POA: Diagnosis not present

## 2017-11-22 DIAGNOSIS — K589 Irritable bowel syndrome without diarrhea: Secondary | ICD-10-CM | POA: Diagnosis not present

## 2017-11-22 DIAGNOSIS — I10 Essential (primary) hypertension: Secondary | ICD-10-CM | POA: Diagnosis not present

## 2017-11-22 DIAGNOSIS — J449 Chronic obstructive pulmonary disease, unspecified: Secondary | ICD-10-CM | POA: Diagnosis not present

## 2017-11-22 DIAGNOSIS — M6281 Muscle weakness (generalized): Secondary | ICD-10-CM | POA: Diagnosis not present

## 2017-11-22 DIAGNOSIS — E785 Hyperlipidemia, unspecified: Secondary | ICD-10-CM | POA: Diagnosis not present

## 2017-11-24 DIAGNOSIS — K589 Irritable bowel syndrome without diarrhea: Secondary | ICD-10-CM | POA: Diagnosis not present

## 2017-11-24 DIAGNOSIS — J449 Chronic obstructive pulmonary disease, unspecified: Secondary | ICD-10-CM | POA: Diagnosis not present

## 2017-11-24 DIAGNOSIS — M6281 Muscle weakness (generalized): Secondary | ICD-10-CM | POA: Diagnosis not present

## 2017-11-24 DIAGNOSIS — I10 Essential (primary) hypertension: Secondary | ICD-10-CM | POA: Diagnosis not present

## 2017-11-24 DIAGNOSIS — E785 Hyperlipidemia, unspecified: Secondary | ICD-10-CM | POA: Diagnosis not present

## 2017-11-25 DIAGNOSIS — I10 Essential (primary) hypertension: Secondary | ICD-10-CM | POA: Diagnosis not present

## 2017-11-25 DIAGNOSIS — J449 Chronic obstructive pulmonary disease, unspecified: Secondary | ICD-10-CM | POA: Diagnosis not present

## 2017-11-25 DIAGNOSIS — K589 Irritable bowel syndrome without diarrhea: Secondary | ICD-10-CM | POA: Diagnosis not present

## 2017-11-25 DIAGNOSIS — M6281 Muscle weakness (generalized): Secondary | ICD-10-CM | POA: Diagnosis not present

## 2017-11-25 DIAGNOSIS — E785 Hyperlipidemia, unspecified: Secondary | ICD-10-CM | POA: Diagnosis not present

## 2017-11-26 DIAGNOSIS — M6281 Muscle weakness (generalized): Secondary | ICD-10-CM | POA: Diagnosis not present

## 2017-11-26 DIAGNOSIS — K589 Irritable bowel syndrome without diarrhea: Secondary | ICD-10-CM | POA: Diagnosis not present

## 2017-11-26 DIAGNOSIS — E785 Hyperlipidemia, unspecified: Secondary | ICD-10-CM | POA: Diagnosis not present

## 2017-11-26 DIAGNOSIS — J449 Chronic obstructive pulmonary disease, unspecified: Secondary | ICD-10-CM | POA: Diagnosis not present

## 2017-11-26 DIAGNOSIS — I10 Essential (primary) hypertension: Secondary | ICD-10-CM | POA: Diagnosis not present

## 2017-12-10 DIAGNOSIS — M1612 Unilateral primary osteoarthritis, left hip: Secondary | ICD-10-CM | POA: Diagnosis not present

## 2018-01-03 DIAGNOSIS — D500 Iron deficiency anemia secondary to blood loss (chronic): Secondary | ICD-10-CM | POA: Diagnosis not present

## 2018-01-03 DIAGNOSIS — I10 Essential (primary) hypertension: Secondary | ICD-10-CM | POA: Diagnosis not present

## 2018-01-03 DIAGNOSIS — F324 Major depressive disorder, single episode, in partial remission: Secondary | ICD-10-CM | POA: Diagnosis not present

## 2018-01-03 DIAGNOSIS — Z9189 Other specified personal risk factors, not elsewhere classified: Secondary | ICD-10-CM | POA: Diagnosis not present

## 2018-01-03 DIAGNOSIS — E7800 Pure hypercholesterolemia, unspecified: Secondary | ICD-10-CM | POA: Diagnosis not present

## 2018-01-03 DIAGNOSIS — K580 Irritable bowel syndrome with diarrhea: Secondary | ICD-10-CM | POA: Diagnosis not present

## 2018-01-03 DIAGNOSIS — F17210 Nicotine dependence, cigarettes, uncomplicated: Secondary | ICD-10-CM | POA: Diagnosis not present

## 2018-01-03 DIAGNOSIS — D649 Anemia, unspecified: Secondary | ICD-10-CM | POA: Diagnosis not present

## 2018-01-03 DIAGNOSIS — R7301 Impaired fasting glucose: Secondary | ICD-10-CM | POA: Diagnosis not present

## 2018-01-03 DIAGNOSIS — E782 Mixed hyperlipidemia: Secondary | ICD-10-CM | POA: Diagnosis not present

## 2018-01-03 DIAGNOSIS — E876 Hypokalemia: Secondary | ICD-10-CM | POA: Diagnosis not present

## 2018-01-03 DIAGNOSIS — J449 Chronic obstructive pulmonary disease, unspecified: Secondary | ICD-10-CM | POA: Diagnosis not present

## 2018-01-07 DIAGNOSIS — D500 Iron deficiency anemia secondary to blood loss (chronic): Secondary | ICD-10-CM | POA: Diagnosis not present

## 2018-01-07 DIAGNOSIS — Z6824 Body mass index (BMI) 24.0-24.9, adult: Secondary | ICD-10-CM | POA: Diagnosis not present

## 2018-01-07 DIAGNOSIS — I739 Peripheral vascular disease, unspecified: Secondary | ICD-10-CM | POA: Diagnosis not present

## 2018-01-07 DIAGNOSIS — I10 Essential (primary) hypertension: Secondary | ICD-10-CM | POA: Diagnosis not present

## 2018-01-07 DIAGNOSIS — E782 Mixed hyperlipidemia: Secondary | ICD-10-CM | POA: Diagnosis not present

## 2018-01-07 DIAGNOSIS — K580 Irritable bowel syndrome with diarrhea: Secondary | ICD-10-CM | POA: Diagnosis not present

## 2018-01-07 DIAGNOSIS — F331 Major depressive disorder, recurrent, moderate: Secondary | ICD-10-CM | POA: Diagnosis not present

## 2018-01-07 DIAGNOSIS — J449 Chronic obstructive pulmonary disease, unspecified: Secondary | ICD-10-CM | POA: Diagnosis not present

## 2018-01-14 DIAGNOSIS — E876 Hypokalemia: Secondary | ICD-10-CM | POA: Diagnosis not present

## 2018-01-18 DIAGNOSIS — R748 Abnormal levels of other serum enzymes: Secondary | ICD-10-CM | POA: Diagnosis not present

## 2018-01-18 DIAGNOSIS — K838 Other specified diseases of biliary tract: Secondary | ICD-10-CM | POA: Diagnosis not present

## 2018-01-18 DIAGNOSIS — N270 Small kidney, unilateral: Secondary | ICD-10-CM | POA: Diagnosis not present

## 2018-01-18 DIAGNOSIS — R7989 Other specified abnormal findings of blood chemistry: Secondary | ICD-10-CM | POA: Diagnosis not present

## 2018-01-24 DIAGNOSIS — K8689 Other specified diseases of pancreas: Secondary | ICD-10-CM | POA: Diagnosis not present

## 2018-01-24 DIAGNOSIS — K838 Other specified diseases of biliary tract: Secondary | ICD-10-CM | POA: Diagnosis not present

## 2018-01-24 DIAGNOSIS — R932 Abnormal findings on diagnostic imaging of liver and biliary tract: Secondary | ICD-10-CM | POA: Diagnosis not present

## 2018-01-24 DIAGNOSIS — K831 Obstruction of bile duct: Secondary | ICD-10-CM | POA: Diagnosis not present

## 2018-02-14 DIAGNOSIS — L89620 Pressure ulcer of left heel, unstageable: Secondary | ICD-10-CM | POA: Diagnosis not present

## 2018-02-14 DIAGNOSIS — S81802A Unspecified open wound, left lower leg, initial encounter: Secondary | ICD-10-CM | POA: Diagnosis not present

## 2018-02-17 DIAGNOSIS — M810 Age-related osteoporosis without current pathological fracture: Secondary | ICD-10-CM | POA: Diagnosis not present

## 2018-02-17 DIAGNOSIS — M818 Other osteoporosis without current pathological fracture: Secondary | ICD-10-CM | POA: Diagnosis not present

## 2018-02-23 DIAGNOSIS — S81802A Unspecified open wound, left lower leg, initial encounter: Secondary | ICD-10-CM | POA: Diagnosis not present

## 2018-02-23 DIAGNOSIS — I739 Peripheral vascular disease, unspecified: Secondary | ICD-10-CM | POA: Diagnosis not present

## 2018-02-23 DIAGNOSIS — L89620 Pressure ulcer of left heel, unstageable: Secondary | ICD-10-CM | POA: Diagnosis not present

## 2018-03-22 DIAGNOSIS — L03116 Cellulitis of left lower limb: Secondary | ICD-10-CM | POA: Diagnosis not present

## 2018-04-12 DIAGNOSIS — R7301 Impaired fasting glucose: Secondary | ICD-10-CM | POA: Diagnosis not present

## 2018-04-12 DIAGNOSIS — I739 Peripheral vascular disease, unspecified: Secondary | ICD-10-CM | POA: Diagnosis not present

## 2018-04-12 DIAGNOSIS — E782 Mixed hyperlipidemia: Secondary | ICD-10-CM | POA: Diagnosis not present

## 2018-04-12 DIAGNOSIS — Z6822 Body mass index (BMI) 22.0-22.9, adult: Secondary | ICD-10-CM | POA: Diagnosis not present

## 2018-04-12 DIAGNOSIS — K580 Irritable bowel syndrome with diarrhea: Secondary | ICD-10-CM | POA: Diagnosis not present

## 2018-04-12 DIAGNOSIS — J449 Chronic obstructive pulmonary disease, unspecified: Secondary | ICD-10-CM | POA: Diagnosis not present

## 2018-04-12 DIAGNOSIS — D500 Iron deficiency anemia secondary to blood loss (chronic): Secondary | ICD-10-CM | POA: Diagnosis not present

## 2018-04-12 DIAGNOSIS — I10 Essential (primary) hypertension: Secondary | ICD-10-CM | POA: Diagnosis not present

## 2018-05-06 ENCOUNTER — Encounter: Payer: Self-pay | Admitting: Family Medicine

## 2018-05-27 DIAGNOSIS — F331 Major depressive disorder, recurrent, moderate: Secondary | ICD-10-CM | POA: Diagnosis not present

## 2018-05-27 DIAGNOSIS — R7301 Impaired fasting glucose: Secondary | ICD-10-CM | POA: Diagnosis not present

## 2018-05-27 DIAGNOSIS — D500 Iron deficiency anemia secondary to blood loss (chronic): Secondary | ICD-10-CM | POA: Diagnosis not present

## 2018-05-27 DIAGNOSIS — F17210 Nicotine dependence, cigarettes, uncomplicated: Secondary | ICD-10-CM | POA: Diagnosis not present

## 2018-05-27 DIAGNOSIS — I10 Essential (primary) hypertension: Secondary | ICD-10-CM | POA: Diagnosis not present

## 2018-05-27 DIAGNOSIS — Z9189 Other specified personal risk factors, not elsewhere classified: Secondary | ICD-10-CM | POA: Diagnosis not present

## 2018-05-27 DIAGNOSIS — E7800 Pure hypercholesterolemia, unspecified: Secondary | ICD-10-CM | POA: Diagnosis not present

## 2018-05-27 DIAGNOSIS — E782 Mixed hyperlipidemia: Secondary | ICD-10-CM | POA: Diagnosis not present

## 2018-05-31 DIAGNOSIS — F331 Major depressive disorder, recurrent, moderate: Secondary | ICD-10-CM | POA: Diagnosis not present

## 2018-05-31 DIAGNOSIS — Z23 Encounter for immunization: Secondary | ICD-10-CM | POA: Diagnosis not present

## 2018-05-31 DIAGNOSIS — I10 Essential (primary) hypertension: Secondary | ICD-10-CM | POA: Diagnosis not present

## 2018-05-31 DIAGNOSIS — K580 Irritable bowel syndrome with diarrhea: Secondary | ICD-10-CM | POA: Diagnosis not present

## 2018-05-31 DIAGNOSIS — J449 Chronic obstructive pulmonary disease, unspecified: Secondary | ICD-10-CM | POA: Diagnosis not present

## 2018-05-31 DIAGNOSIS — D500 Iron deficiency anemia secondary to blood loss (chronic): Secondary | ICD-10-CM | POA: Diagnosis not present

## 2018-05-31 DIAGNOSIS — I739 Peripheral vascular disease, unspecified: Secondary | ICD-10-CM | POA: Diagnosis not present

## 2018-05-31 DIAGNOSIS — R7301 Impaired fasting glucose: Secondary | ICD-10-CM | POA: Diagnosis not present

## 2018-05-31 DIAGNOSIS — M1612 Unilateral primary osteoarthritis, left hip: Secondary | ICD-10-CM | POA: Diagnosis not present

## 2018-05-31 DIAGNOSIS — Z0001 Encounter for general adult medical examination with abnormal findings: Secondary | ICD-10-CM | POA: Diagnosis not present

## 2018-05-31 DIAGNOSIS — Z6821 Body mass index (BMI) 21.0-21.9, adult: Secondary | ICD-10-CM | POA: Diagnosis not present

## 2018-05-31 DIAGNOSIS — E782 Mixed hyperlipidemia: Secondary | ICD-10-CM | POA: Diagnosis not present

## 2018-06-07 DIAGNOSIS — L03116 Cellulitis of left lower limb: Secondary | ICD-10-CM | POA: Diagnosis not present

## 2018-06-07 DIAGNOSIS — L03115 Cellulitis of right lower limb: Secondary | ICD-10-CM | POA: Diagnosis not present

## 2018-06-10 DIAGNOSIS — K4091 Unilateral inguinal hernia, without obstruction or gangrene, recurrent: Secondary | ICD-10-CM | POA: Diagnosis not present

## 2018-06-10 DIAGNOSIS — M25551 Pain in right hip: Secondary | ICD-10-CM | POA: Diagnosis not present

## 2018-06-10 DIAGNOSIS — L03116 Cellulitis of left lower limb: Secondary | ICD-10-CM | POA: Diagnosis not present

## 2018-06-10 DIAGNOSIS — N8110 Cystocele, unspecified: Secondary | ICD-10-CM | POA: Diagnosis not present

## 2018-06-10 DIAGNOSIS — L03115 Cellulitis of right lower limb: Secondary | ICD-10-CM | POA: Diagnosis not present

## 2018-06-10 DIAGNOSIS — Z681 Body mass index (BMI) 19 or less, adult: Secondary | ICD-10-CM | POA: Diagnosis not present

## 2018-06-10 DIAGNOSIS — R110 Nausea: Secondary | ICD-10-CM | POA: Diagnosis not present

## 2018-06-10 DIAGNOSIS — M542 Cervicalgia: Secondary | ICD-10-CM | POA: Diagnosis not present

## 2018-06-14 DIAGNOSIS — L03116 Cellulitis of left lower limb: Secondary | ICD-10-CM | POA: Diagnosis not present

## 2018-06-14 DIAGNOSIS — L03115 Cellulitis of right lower limb: Secondary | ICD-10-CM | POA: Diagnosis not present

## 2018-06-28 DIAGNOSIS — L03115 Cellulitis of right lower limb: Secondary | ICD-10-CM | POA: Diagnosis not present

## 2018-06-28 DIAGNOSIS — S91302D Unspecified open wound, left foot, subsequent encounter: Secondary | ICD-10-CM | POA: Diagnosis not present

## 2018-06-28 DIAGNOSIS — L03116 Cellulitis of left lower limb: Secondary | ICD-10-CM | POA: Diagnosis not present

## 2018-06-28 DIAGNOSIS — S81802A Unspecified open wound, left lower leg, initial encounter: Secondary | ICD-10-CM | POA: Diagnosis not present

## 2018-06-29 DIAGNOSIS — M79671 Pain in right foot: Secondary | ICD-10-CM | POA: Diagnosis not present

## 2018-06-29 DIAGNOSIS — M79672 Pain in left foot: Secondary | ICD-10-CM | POA: Diagnosis not present

## 2018-06-29 DIAGNOSIS — I739 Peripheral vascular disease, unspecified: Secondary | ICD-10-CM | POA: Diagnosis not present

## 2018-06-29 DIAGNOSIS — L89893 Pressure ulcer of other site, stage 3: Secondary | ICD-10-CM | POA: Diagnosis not present

## 2018-07-12 DIAGNOSIS — L97509 Non-pressure chronic ulcer of other part of unspecified foot with unspecified severity: Secondary | ICD-10-CM | POA: Diagnosis not present

## 2018-07-12 DIAGNOSIS — S91302D Unspecified open wound, left foot, subsequent encounter: Secondary | ICD-10-CM | POA: Diagnosis not present

## 2018-07-12 DIAGNOSIS — L03115 Cellulitis of right lower limb: Secondary | ICD-10-CM | POA: Diagnosis not present

## 2018-07-12 DIAGNOSIS — L03116 Cellulitis of left lower limb: Secondary | ICD-10-CM | POA: Diagnosis not present

## 2018-08-02 DIAGNOSIS — S81801A Unspecified open wound, right lower leg, initial encounter: Secondary | ICD-10-CM | POA: Diagnosis not present

## 2018-08-02 DIAGNOSIS — S81801D Unspecified open wound, right lower leg, subsequent encounter: Secondary | ICD-10-CM | POA: Diagnosis not present

## 2018-08-15 DIAGNOSIS — E782 Mixed hyperlipidemia: Secondary | ICD-10-CM | POA: Diagnosis not present

## 2018-08-15 DIAGNOSIS — K219 Gastro-esophageal reflux disease without esophagitis: Secondary | ICD-10-CM | POA: Diagnosis not present

## 2018-08-15 DIAGNOSIS — I10 Essential (primary) hypertension: Secondary | ICD-10-CM | POA: Diagnosis not present

## 2018-08-25 DIAGNOSIS — K219 Gastro-esophageal reflux disease without esophagitis: Secondary | ICD-10-CM | POA: Diagnosis not present

## 2018-08-25 DIAGNOSIS — D509 Iron deficiency anemia, unspecified: Secondary | ICD-10-CM | POA: Diagnosis not present

## 2018-08-25 DIAGNOSIS — D519 Vitamin B12 deficiency anemia, unspecified: Secondary | ICD-10-CM | POA: Diagnosis not present

## 2018-08-25 DIAGNOSIS — I10 Essential (primary) hypertension: Secondary | ICD-10-CM | POA: Diagnosis not present

## 2018-08-25 DIAGNOSIS — D649 Anemia, unspecified: Secondary | ICD-10-CM | POA: Diagnosis not present

## 2018-08-25 DIAGNOSIS — R7301 Impaired fasting glucose: Secondary | ICD-10-CM | POA: Diagnosis not present

## 2018-08-29 DIAGNOSIS — E782 Mixed hyperlipidemia: Secondary | ICD-10-CM | POA: Diagnosis not present

## 2018-08-29 DIAGNOSIS — I872 Venous insufficiency (chronic) (peripheral): Secondary | ICD-10-CM | POA: Diagnosis not present

## 2018-08-29 DIAGNOSIS — Z23 Encounter for immunization: Secondary | ICD-10-CM | POA: Diagnosis not present

## 2018-08-29 DIAGNOSIS — R7301 Impaired fasting glucose: Secondary | ICD-10-CM | POA: Diagnosis not present

## 2018-08-29 DIAGNOSIS — I10 Essential (primary) hypertension: Secondary | ICD-10-CM | POA: Diagnosis not present

## 2018-08-29 DIAGNOSIS — I739 Peripheral vascular disease, unspecified: Secondary | ICD-10-CM | POA: Diagnosis not present

## 2018-08-29 DIAGNOSIS — D500 Iron deficiency anemia secondary to blood loss (chronic): Secondary | ICD-10-CM | POA: Diagnosis not present

## 2018-08-29 DIAGNOSIS — J449 Chronic obstructive pulmonary disease, unspecified: Secondary | ICD-10-CM | POA: Diagnosis not present

## 2018-08-30 DIAGNOSIS — L97228 Non-pressure chronic ulcer of left calf with other specified severity: Secondary | ICD-10-CM | POA: Diagnosis not present

## 2018-08-30 DIAGNOSIS — L97819 Non-pressure chronic ulcer of other part of right lower leg with unspecified severity: Secondary | ICD-10-CM | POA: Diagnosis not present

## 2018-08-30 DIAGNOSIS — L97818 Non-pressure chronic ulcer of other part of right lower leg with other specified severity: Secondary | ICD-10-CM | POA: Diagnosis not present

## 2018-08-30 DIAGNOSIS — L03115 Cellulitis of right lower limb: Secondary | ICD-10-CM | POA: Diagnosis not present

## 2018-08-30 DIAGNOSIS — L97229 Non-pressure chronic ulcer of left calf with unspecified severity: Secondary | ICD-10-CM | POA: Diagnosis not present

## 2018-08-30 DIAGNOSIS — L03116 Cellulitis of left lower limb: Secondary | ICD-10-CM | POA: Diagnosis not present

## 2018-09-26 DIAGNOSIS — Z96642 Presence of left artificial hip joint: Secondary | ICD-10-CM | POA: Diagnosis not present

## 2018-09-26 DIAGNOSIS — M1611 Unilateral primary osteoarthritis, right hip: Secondary | ICD-10-CM | POA: Diagnosis not present

## 2018-10-18 DIAGNOSIS — I739 Peripheral vascular disease, unspecified: Secondary | ICD-10-CM | POA: Diagnosis not present

## 2018-10-18 DIAGNOSIS — L97928 Non-pressure chronic ulcer of unspecified part of left lower leg with other specified severity: Secondary | ICD-10-CM | POA: Diagnosis not present

## 2018-10-18 DIAGNOSIS — L97329 Non-pressure chronic ulcer of left ankle with unspecified severity: Secondary | ICD-10-CM | POA: Diagnosis not present

## 2018-11-11 DIAGNOSIS — I739 Peripheral vascular disease, unspecified: Secondary | ICD-10-CM | POA: Diagnosis not present

## 2018-11-11 DIAGNOSIS — L97529 Non-pressure chronic ulcer of other part of left foot with unspecified severity: Secondary | ICD-10-CM | POA: Diagnosis not present

## 2018-11-11 DIAGNOSIS — L97928 Non-pressure chronic ulcer of unspecified part of left lower leg with other specified severity: Secondary | ICD-10-CM | POA: Diagnosis not present

## 2018-11-14 DIAGNOSIS — K580 Irritable bowel syndrome with diarrhea: Secondary | ICD-10-CM | POA: Diagnosis not present

## 2018-11-14 DIAGNOSIS — K219 Gastro-esophageal reflux disease without esophagitis: Secondary | ICD-10-CM | POA: Diagnosis not present

## 2018-11-14 DIAGNOSIS — M1990 Unspecified osteoarthritis, unspecified site: Secondary | ICD-10-CM | POA: Diagnosis not present

## 2018-11-25 DIAGNOSIS — I70244 Atherosclerosis of native arteries of left leg with ulceration of heel and midfoot: Secondary | ICD-10-CM | POA: Diagnosis not present

## 2018-11-25 DIAGNOSIS — L97928 Non-pressure chronic ulcer of unspecified part of left lower leg with other specified severity: Secondary | ICD-10-CM | POA: Diagnosis not present

## 2018-11-25 DIAGNOSIS — I70249 Atherosclerosis of native arteries of left leg with ulceration of unspecified site: Secondary | ICD-10-CM | POA: Diagnosis not present

## 2018-11-25 DIAGNOSIS — L97929 Non-pressure chronic ulcer of unspecified part of left lower leg with unspecified severity: Secondary | ICD-10-CM | POA: Diagnosis not present

## 2018-11-28 DIAGNOSIS — M1611 Unilateral primary osteoarthritis, right hip: Secondary | ICD-10-CM | POA: Diagnosis not present

## 2018-11-28 DIAGNOSIS — Z96642 Presence of left artificial hip joint: Secondary | ICD-10-CM | POA: Diagnosis not present

## 2018-11-30 DIAGNOSIS — D500 Iron deficiency anemia secondary to blood loss (chronic): Secondary | ICD-10-CM | POA: Diagnosis not present

## 2018-11-30 DIAGNOSIS — R932 Abnormal findings on diagnostic imaging of liver and biliary tract: Secondary | ICD-10-CM | POA: Diagnosis not present

## 2018-11-30 DIAGNOSIS — D519 Vitamin B12 deficiency anemia, unspecified: Secondary | ICD-10-CM | POA: Diagnosis not present

## 2018-11-30 DIAGNOSIS — E876 Hypokalemia: Secondary | ICD-10-CM | POA: Diagnosis not present

## 2018-11-30 DIAGNOSIS — R7301 Impaired fasting glucose: Secondary | ICD-10-CM | POA: Diagnosis not present

## 2018-11-30 DIAGNOSIS — E782 Mixed hyperlipidemia: Secondary | ICD-10-CM | POA: Diagnosis not present

## 2018-11-30 DIAGNOSIS — D649 Anemia, unspecified: Secondary | ICD-10-CM | POA: Diagnosis not present

## 2018-11-30 DIAGNOSIS — I739 Peripheral vascular disease, unspecified: Secondary | ICD-10-CM | POA: Diagnosis not present

## 2018-11-30 DIAGNOSIS — J449 Chronic obstructive pulmonary disease, unspecified: Secondary | ICD-10-CM | POA: Diagnosis not present

## 2018-11-30 DIAGNOSIS — K219 Gastro-esophageal reflux disease without esophagitis: Secondary | ICD-10-CM | POA: Diagnosis not present

## 2018-12-02 DIAGNOSIS — L97928 Non-pressure chronic ulcer of unspecified part of left lower leg with other specified severity: Secondary | ICD-10-CM | POA: Diagnosis not present

## 2018-12-02 DIAGNOSIS — I70249 Atherosclerosis of native arteries of left leg with ulceration of unspecified site: Secondary | ICD-10-CM | POA: Diagnosis not present

## 2018-12-02 DIAGNOSIS — L97929 Non-pressure chronic ulcer of unspecified part of left lower leg with unspecified severity: Secondary | ICD-10-CM | POA: Diagnosis not present

## 2018-12-07 DIAGNOSIS — I10 Essential (primary) hypertension: Secondary | ICD-10-CM | POA: Diagnosis not present

## 2018-12-07 DIAGNOSIS — E782 Mixed hyperlipidemia: Secondary | ICD-10-CM | POA: Diagnosis not present

## 2018-12-07 DIAGNOSIS — D500 Iron deficiency anemia secondary to blood loss (chronic): Secondary | ICD-10-CM | POA: Diagnosis not present

## 2018-12-07 DIAGNOSIS — K7581 Nonalcoholic steatohepatitis (NASH): Secondary | ICD-10-CM | POA: Diagnosis not present

## 2018-12-07 DIAGNOSIS — I739 Peripheral vascular disease, unspecified: Secondary | ICD-10-CM | POA: Diagnosis not present

## 2018-12-07 DIAGNOSIS — R7301 Impaired fasting glucose: Secondary | ICD-10-CM | POA: Diagnosis not present

## 2018-12-07 DIAGNOSIS — J449 Chronic obstructive pulmonary disease, unspecified: Secondary | ICD-10-CM | POA: Diagnosis not present

## 2018-12-09 DIAGNOSIS — L97928 Non-pressure chronic ulcer of unspecified part of left lower leg with other specified severity: Secondary | ICD-10-CM | POA: Diagnosis not present

## 2018-12-09 DIAGNOSIS — I70249 Atherosclerosis of native arteries of left leg with ulceration of unspecified site: Secondary | ICD-10-CM | POA: Diagnosis not present

## 2018-12-09 DIAGNOSIS — L97529 Non-pressure chronic ulcer of other part of left foot with unspecified severity: Secondary | ICD-10-CM | POA: Diagnosis not present

## 2018-12-14 DIAGNOSIS — N183 Chronic kidney disease, stage 3 (moderate): Secondary | ICD-10-CM | POA: Diagnosis not present

## 2018-12-14 DIAGNOSIS — E876 Hypokalemia: Secondary | ICD-10-CM | POA: Diagnosis not present

## 2018-12-16 DIAGNOSIS — L97928 Non-pressure chronic ulcer of unspecified part of left lower leg with other specified severity: Secondary | ICD-10-CM | POA: Diagnosis not present

## 2018-12-16 DIAGNOSIS — I70244 Atherosclerosis of native arteries of left leg with ulceration of heel and midfoot: Secondary | ICD-10-CM | POA: Diagnosis not present

## 2018-12-16 DIAGNOSIS — I70249 Atherosclerosis of native arteries of left leg with ulceration of unspecified site: Secondary | ICD-10-CM | POA: Diagnosis not present

## 2018-12-23 DIAGNOSIS — I739 Peripheral vascular disease, unspecified: Secondary | ICD-10-CM | POA: Diagnosis not present

## 2018-12-23 DIAGNOSIS — L97929 Non-pressure chronic ulcer of unspecified part of left lower leg with unspecified severity: Secondary | ICD-10-CM | POA: Diagnosis not present

## 2018-12-23 DIAGNOSIS — I70248 Atherosclerosis of native arteries of left leg with ulceration of other part of lower left leg: Secondary | ICD-10-CM | POA: Diagnosis not present

## 2018-12-23 DIAGNOSIS — L97828 Non-pressure chronic ulcer of other part of left lower leg with other specified severity: Secondary | ICD-10-CM | POA: Diagnosis not present

## 2018-12-30 DIAGNOSIS — I7025 Atherosclerosis of native arteries of other extremities with ulceration: Secondary | ICD-10-CM | POA: Diagnosis not present

## 2018-12-30 DIAGNOSIS — L97828 Non-pressure chronic ulcer of other part of left lower leg with other specified severity: Secondary | ICD-10-CM | POA: Diagnosis not present

## 2018-12-30 DIAGNOSIS — L97529 Non-pressure chronic ulcer of other part of left foot with unspecified severity: Secondary | ICD-10-CM | POA: Diagnosis not present

## 2018-12-30 DIAGNOSIS — I70248 Atherosclerosis of native arteries of left leg with ulceration of other part of lower left leg: Secondary | ICD-10-CM | POA: Diagnosis not present

## 2019-01-06 DIAGNOSIS — I70248 Atherosclerosis of native arteries of left leg with ulceration of other part of lower left leg: Secondary | ICD-10-CM | POA: Diagnosis not present

## 2019-01-06 DIAGNOSIS — I70244 Atherosclerosis of native arteries of left leg with ulceration of heel and midfoot: Secondary | ICD-10-CM | POA: Diagnosis not present

## 2019-01-06 DIAGNOSIS — L97828 Non-pressure chronic ulcer of other part of left lower leg with other specified severity: Secondary | ICD-10-CM | POA: Diagnosis not present

## 2019-01-13 DIAGNOSIS — I70248 Atherosclerosis of native arteries of left leg with ulceration of other part of lower left leg: Secondary | ICD-10-CM | POA: Diagnosis not present

## 2019-01-13 DIAGNOSIS — L97828 Non-pressure chronic ulcer of other part of left lower leg with other specified severity: Secondary | ICD-10-CM | POA: Diagnosis not present

## 2019-01-20 DIAGNOSIS — I70244 Atherosclerosis of native arteries of left leg with ulceration of heel and midfoot: Secondary | ICD-10-CM | POA: Diagnosis not present

## 2019-01-20 DIAGNOSIS — I70248 Atherosclerosis of native arteries of left leg with ulceration of other part of lower left leg: Secondary | ICD-10-CM | POA: Diagnosis not present

## 2019-01-20 DIAGNOSIS — L97828 Non-pressure chronic ulcer of other part of left lower leg with other specified severity: Secondary | ICD-10-CM | POA: Diagnosis not present

## 2019-01-27 DIAGNOSIS — I70248 Atherosclerosis of native arteries of left leg with ulceration of other part of lower left leg: Secondary | ICD-10-CM | POA: Diagnosis not present

## 2019-01-27 DIAGNOSIS — L97828 Non-pressure chronic ulcer of other part of left lower leg with other specified severity: Secondary | ICD-10-CM | POA: Diagnosis not present

## 2019-02-03 DIAGNOSIS — I70248 Atherosclerosis of native arteries of left leg with ulceration of other part of lower left leg: Secondary | ICD-10-CM | POA: Diagnosis not present

## 2019-02-03 DIAGNOSIS — I70244 Atherosclerosis of native arteries of left leg with ulceration of heel and midfoot: Secondary | ICD-10-CM | POA: Diagnosis not present

## 2019-02-03 DIAGNOSIS — L97529 Non-pressure chronic ulcer of other part of left foot with unspecified severity: Secondary | ICD-10-CM | POA: Diagnosis not present

## 2019-02-03 DIAGNOSIS — L97828 Non-pressure chronic ulcer of other part of left lower leg with other specified severity: Secondary | ICD-10-CM | POA: Diagnosis not present

## 2019-02-09 DIAGNOSIS — L97828 Non-pressure chronic ulcer of other part of left lower leg with other specified severity: Secondary | ICD-10-CM | POA: Diagnosis not present

## 2019-02-09 DIAGNOSIS — I70248 Atherosclerosis of native arteries of left leg with ulceration of other part of lower left leg: Secondary | ICD-10-CM | POA: Diagnosis not present

## 2019-02-17 DIAGNOSIS — L97828 Non-pressure chronic ulcer of other part of left lower leg with other specified severity: Secondary | ICD-10-CM | POA: Diagnosis not present

## 2019-02-17 DIAGNOSIS — I70249 Atherosclerosis of native arteries of left leg with ulceration of unspecified site: Secondary | ICD-10-CM | POA: Diagnosis not present

## 2019-02-17 DIAGNOSIS — I70248 Atherosclerosis of native arteries of left leg with ulceration of other part of lower left leg: Secondary | ICD-10-CM | POA: Diagnosis not present

## 2019-03-03 DIAGNOSIS — I10 Essential (primary) hypertension: Secondary | ICD-10-CM | POA: Diagnosis not present

## 2019-03-03 DIAGNOSIS — E782 Mixed hyperlipidemia: Secondary | ICD-10-CM | POA: Diagnosis not present

## 2019-03-03 DIAGNOSIS — D519 Vitamin B12 deficiency anemia, unspecified: Secondary | ICD-10-CM | POA: Diagnosis not present

## 2019-03-03 DIAGNOSIS — D649 Anemia, unspecified: Secondary | ICD-10-CM | POA: Diagnosis not present

## 2019-03-03 DIAGNOSIS — I70248 Atherosclerosis of native arteries of left leg with ulceration of other part of lower left leg: Secondary | ICD-10-CM | POA: Diagnosis not present

## 2019-03-03 DIAGNOSIS — L97828 Non-pressure chronic ulcer of other part of left lower leg with other specified severity: Secondary | ICD-10-CM | POA: Diagnosis not present

## 2019-03-03 DIAGNOSIS — D529 Folate deficiency anemia, unspecified: Secondary | ICD-10-CM | POA: Diagnosis not present

## 2019-03-03 DIAGNOSIS — N183 Chronic kidney disease, stage 3 (moderate): Secondary | ICD-10-CM | POA: Diagnosis not present

## 2019-03-03 DIAGNOSIS — R7301 Impaired fasting glucose: Secondary | ICD-10-CM | POA: Diagnosis not present

## 2019-03-03 DIAGNOSIS — F17210 Nicotine dependence, cigarettes, uncomplicated: Secondary | ICD-10-CM | POA: Diagnosis not present

## 2019-03-03 DIAGNOSIS — L97922 Non-pressure chronic ulcer of unspecified part of left lower leg with fat layer exposed: Secondary | ICD-10-CM | POA: Diagnosis not present

## 2019-03-03 DIAGNOSIS — J449 Chronic obstructive pulmonary disease, unspecified: Secondary | ICD-10-CM | POA: Diagnosis not present

## 2019-03-03 DIAGNOSIS — K219 Gastro-esophageal reflux disease without esophagitis: Secondary | ICD-10-CM | POA: Diagnosis not present

## 2019-03-03 DIAGNOSIS — I7025 Atherosclerosis of native arteries of other extremities with ulceration: Secondary | ICD-10-CM | POA: Diagnosis not present

## 2019-03-08 DIAGNOSIS — N183 Chronic kidney disease, stage 3 (moderate): Secondary | ICD-10-CM | POA: Diagnosis not present

## 2019-03-08 DIAGNOSIS — J449 Chronic obstructive pulmonary disease, unspecified: Secondary | ICD-10-CM | POA: Diagnosis not present

## 2019-03-08 DIAGNOSIS — I10 Essential (primary) hypertension: Secondary | ICD-10-CM | POA: Diagnosis not present

## 2019-03-08 DIAGNOSIS — E782 Mixed hyperlipidemia: Secondary | ICD-10-CM | POA: Diagnosis not present

## 2019-03-08 DIAGNOSIS — I739 Peripheral vascular disease, unspecified: Secondary | ICD-10-CM | POA: Diagnosis not present

## 2019-03-08 DIAGNOSIS — D500 Iron deficiency anemia secondary to blood loss (chronic): Secondary | ICD-10-CM | POA: Diagnosis not present

## 2019-03-08 DIAGNOSIS — K580 Irritable bowel syndrome with diarrhea: Secondary | ICD-10-CM | POA: Diagnosis not present

## 2019-03-16 DIAGNOSIS — E782 Mixed hyperlipidemia: Secondary | ICD-10-CM | POA: Diagnosis not present

## 2019-03-16 DIAGNOSIS — I10 Essential (primary) hypertension: Secondary | ICD-10-CM | POA: Diagnosis not present

## 2019-03-28 DIAGNOSIS — L97828 Non-pressure chronic ulcer of other part of left lower leg with other specified severity: Secondary | ICD-10-CM | POA: Diagnosis not present

## 2019-03-28 DIAGNOSIS — I739 Peripheral vascular disease, unspecified: Secondary | ICD-10-CM | POA: Diagnosis not present

## 2019-03-28 DIAGNOSIS — Z872 Personal history of diseases of the skin and subcutaneous tissue: Secondary | ICD-10-CM | POA: Diagnosis not present

## 2019-03-28 DIAGNOSIS — I70248 Atherosclerosis of native arteries of left leg with ulceration of other part of lower left leg: Secondary | ICD-10-CM | POA: Diagnosis not present

## 2019-04-15 DIAGNOSIS — I10 Essential (primary) hypertension: Secondary | ICD-10-CM | POA: Diagnosis not present

## 2019-04-15 DIAGNOSIS — E782 Mixed hyperlipidemia: Secondary | ICD-10-CM | POA: Diagnosis not present

## 2019-05-16 DIAGNOSIS — I10 Essential (primary) hypertension: Secondary | ICD-10-CM | POA: Diagnosis not present

## 2019-05-16 DIAGNOSIS — E782 Mixed hyperlipidemia: Secondary | ICD-10-CM | POA: Diagnosis not present

## 2019-06-02 DIAGNOSIS — J449 Chronic obstructive pulmonary disease, unspecified: Secondary | ICD-10-CM | POA: Diagnosis not present

## 2019-06-02 DIAGNOSIS — D519 Vitamin B12 deficiency anemia, unspecified: Secondary | ICD-10-CM | POA: Diagnosis not present

## 2019-06-02 DIAGNOSIS — N183 Chronic kidney disease, stage 3 (moderate): Secondary | ICD-10-CM | POA: Diagnosis not present

## 2019-06-02 DIAGNOSIS — E782 Mixed hyperlipidemia: Secondary | ICD-10-CM | POA: Diagnosis not present

## 2019-06-02 DIAGNOSIS — R7301 Impaired fasting glucose: Secondary | ICD-10-CM | POA: Diagnosis not present

## 2019-06-02 DIAGNOSIS — I10 Essential (primary) hypertension: Secondary | ICD-10-CM | POA: Diagnosis not present

## 2019-06-02 DIAGNOSIS — K219 Gastro-esophageal reflux disease without esophagitis: Secondary | ICD-10-CM | POA: Diagnosis not present

## 2019-06-02 DIAGNOSIS — D649 Anemia, unspecified: Secondary | ICD-10-CM | POA: Diagnosis not present

## 2019-06-06 DIAGNOSIS — D500 Iron deficiency anemia secondary to blood loss (chronic): Secondary | ICD-10-CM | POA: Diagnosis not present

## 2019-06-06 DIAGNOSIS — L280 Lichen simplex chronicus: Secondary | ICD-10-CM | POA: Diagnosis not present

## 2019-06-06 DIAGNOSIS — Z0001 Encounter for general adult medical examination with abnormal findings: Secondary | ICD-10-CM | POA: Diagnosis not present

## 2019-06-06 DIAGNOSIS — Z1212 Encounter for screening for malignant neoplasm of rectum: Secondary | ICD-10-CM | POA: Diagnosis not present

## 2019-06-06 DIAGNOSIS — Z1389 Encounter for screening for other disorder: Secondary | ICD-10-CM | POA: Diagnosis not present

## 2019-06-06 DIAGNOSIS — Z23 Encounter for immunization: Secondary | ICD-10-CM | POA: Diagnosis not present

## 2019-06-06 DIAGNOSIS — I10 Essential (primary) hypertension: Secondary | ICD-10-CM | POA: Diagnosis not present

## 2019-06-06 DIAGNOSIS — J449 Chronic obstructive pulmonary disease, unspecified: Secondary | ICD-10-CM | POA: Diagnosis not present

## 2019-06-09 DIAGNOSIS — I70244 Atherosclerosis of native arteries of left leg with ulceration of heel and midfoot: Secondary | ICD-10-CM | POA: Diagnosis not present

## 2019-06-12 DIAGNOSIS — I10 Essential (primary) hypertension: Secondary | ICD-10-CM | POA: Diagnosis not present

## 2019-06-12 DIAGNOSIS — R011 Cardiac murmur, unspecified: Secondary | ICD-10-CM | POA: Diagnosis not present

## 2019-06-13 DIAGNOSIS — J449 Chronic obstructive pulmonary disease, unspecified: Secondary | ICD-10-CM | POA: Diagnosis not present

## 2019-06-13 DIAGNOSIS — N183 Chronic kidney disease, stage 3 (moderate): Secondary | ICD-10-CM | POA: Diagnosis not present

## 2019-06-13 DIAGNOSIS — E876 Hypokalemia: Secondary | ICD-10-CM | POA: Diagnosis not present

## 2019-06-13 DIAGNOSIS — I700 Atherosclerosis of aorta: Secondary | ICD-10-CM | POA: Diagnosis not present

## 2019-06-13 DIAGNOSIS — F17210 Nicotine dependence, cigarettes, uncomplicated: Secondary | ICD-10-CM | POA: Diagnosis not present

## 2019-06-13 DIAGNOSIS — Z87891 Personal history of nicotine dependence: Secondary | ICD-10-CM | POA: Diagnosis not present

## 2019-06-13 DIAGNOSIS — J439 Emphysema, unspecified: Secondary | ICD-10-CM | POA: Diagnosis not present

## 2019-06-14 DIAGNOSIS — S31819D Unspecified open wound of right buttock, subsequent encounter: Secondary | ICD-10-CM | POA: Diagnosis not present

## 2019-06-14 DIAGNOSIS — Z87891 Personal history of nicotine dependence: Secondary | ICD-10-CM | POA: Diagnosis not present

## 2019-06-14 DIAGNOSIS — S31829D Unspecified open wound of left buttock, subsequent encounter: Secondary | ICD-10-CM | POA: Diagnosis not present

## 2019-06-14 DIAGNOSIS — I87312 Chronic venous hypertension (idiopathic) with ulcer of left lower extremity: Secondary | ICD-10-CM | POA: Diagnosis not present

## 2019-06-14 DIAGNOSIS — J449 Chronic obstructive pulmonary disease, unspecified: Secondary | ICD-10-CM | POA: Diagnosis not present

## 2019-06-14 DIAGNOSIS — I70248 Atherosclerosis of native arteries of left leg with ulceration of other part of lower left leg: Secondary | ICD-10-CM | POA: Diagnosis not present

## 2019-06-14 DIAGNOSIS — L97921 Non-pressure chronic ulcer of unspecified part of left lower leg limited to breakdown of skin: Secondary | ICD-10-CM | POA: Diagnosis not present

## 2019-06-14 DIAGNOSIS — I739 Peripheral vascular disease, unspecified: Secondary | ICD-10-CM | POA: Diagnosis not present

## 2019-06-21 DIAGNOSIS — S31829D Unspecified open wound of left buttock, subsequent encounter: Secondary | ICD-10-CM | POA: Diagnosis not present

## 2019-06-21 DIAGNOSIS — S31819D Unspecified open wound of right buttock, subsequent encounter: Secondary | ICD-10-CM | POA: Diagnosis not present

## 2019-06-21 DIAGNOSIS — Z87891 Personal history of nicotine dependence: Secondary | ICD-10-CM | POA: Diagnosis not present

## 2019-06-21 DIAGNOSIS — L97921 Non-pressure chronic ulcer of unspecified part of left lower leg limited to breakdown of skin: Secondary | ICD-10-CM | POA: Diagnosis not present

## 2019-06-21 DIAGNOSIS — J449 Chronic obstructive pulmonary disease, unspecified: Secondary | ICD-10-CM | POA: Diagnosis not present

## 2019-06-21 DIAGNOSIS — I87312 Chronic venous hypertension (idiopathic) with ulcer of left lower extremity: Secondary | ICD-10-CM | POA: Diagnosis not present

## 2019-06-28 DIAGNOSIS — S31829D Unspecified open wound of left buttock, subsequent encounter: Secondary | ICD-10-CM | POA: Diagnosis not present

## 2019-06-28 DIAGNOSIS — J449 Chronic obstructive pulmonary disease, unspecified: Secondary | ICD-10-CM | POA: Diagnosis not present

## 2019-06-28 DIAGNOSIS — I87312 Chronic venous hypertension (idiopathic) with ulcer of left lower extremity: Secondary | ICD-10-CM | POA: Diagnosis not present

## 2019-06-28 DIAGNOSIS — S31819D Unspecified open wound of right buttock, subsequent encounter: Secondary | ICD-10-CM | POA: Diagnosis not present

## 2019-06-28 DIAGNOSIS — L97921 Non-pressure chronic ulcer of unspecified part of left lower leg limited to breakdown of skin: Secondary | ICD-10-CM | POA: Diagnosis not present

## 2019-06-28 DIAGNOSIS — Z87891 Personal history of nicotine dependence: Secondary | ICD-10-CM | POA: Diagnosis not present

## 2019-06-30 DIAGNOSIS — I872 Venous insufficiency (chronic) (peripheral): Secondary | ICD-10-CM | POA: Diagnosis not present

## 2019-06-30 DIAGNOSIS — L0390 Cellulitis, unspecified: Secondary | ICD-10-CM | POA: Diagnosis not present

## 2019-07-04 DIAGNOSIS — I7090 Unspecified atherosclerosis: Secondary | ICD-10-CM | POA: Diagnosis not present

## 2019-07-04 DIAGNOSIS — L97929 Non-pressure chronic ulcer of unspecified part of left lower leg with unspecified severity: Secondary | ICD-10-CM | POA: Diagnosis not present

## 2019-07-04 DIAGNOSIS — L03115 Cellulitis of right lower limb: Secondary | ICD-10-CM | POA: Diagnosis not present

## 2019-07-04 DIAGNOSIS — L03116 Cellulitis of left lower limb: Secondary | ICD-10-CM | POA: Diagnosis not present

## 2019-07-04 DIAGNOSIS — I872 Venous insufficiency (chronic) (peripheral): Secondary | ICD-10-CM | POA: Diagnosis not present

## 2019-07-13 DIAGNOSIS — S31829D Unspecified open wound of left buttock, subsequent encounter: Secondary | ICD-10-CM | POA: Diagnosis not present

## 2019-07-13 DIAGNOSIS — J449 Chronic obstructive pulmonary disease, unspecified: Secondary | ICD-10-CM | POA: Diagnosis not present

## 2019-07-13 DIAGNOSIS — I87312 Chronic venous hypertension (idiopathic) with ulcer of left lower extremity: Secondary | ICD-10-CM | POA: Diagnosis not present

## 2019-07-13 DIAGNOSIS — L97921 Non-pressure chronic ulcer of unspecified part of left lower leg limited to breakdown of skin: Secondary | ICD-10-CM | POA: Diagnosis not present

## 2019-07-13 DIAGNOSIS — Z87891 Personal history of nicotine dependence: Secondary | ICD-10-CM | POA: Diagnosis not present

## 2019-07-13 DIAGNOSIS — S31819D Unspecified open wound of right buttock, subsequent encounter: Secondary | ICD-10-CM | POA: Diagnosis not present

## 2019-07-14 DIAGNOSIS — Z1231 Encounter for screening mammogram for malignant neoplasm of breast: Secondary | ICD-10-CM | POA: Diagnosis not present

## 2019-07-14 DIAGNOSIS — I87312 Chronic venous hypertension (idiopathic) with ulcer of left lower extremity: Secondary | ICD-10-CM | POA: Diagnosis not present

## 2019-07-14 DIAGNOSIS — L97921 Non-pressure chronic ulcer of unspecified part of left lower leg limited to breakdown of skin: Secondary | ICD-10-CM | POA: Diagnosis not present

## 2019-07-14 DIAGNOSIS — S31819D Unspecified open wound of right buttock, subsequent encounter: Secondary | ICD-10-CM | POA: Diagnosis not present

## 2019-07-14 DIAGNOSIS — S31829D Unspecified open wound of left buttock, subsequent encounter: Secondary | ICD-10-CM | POA: Diagnosis not present

## 2019-07-14 DIAGNOSIS — J449 Chronic obstructive pulmonary disease, unspecified: Secondary | ICD-10-CM | POA: Diagnosis not present

## 2019-07-14 DIAGNOSIS — Z87891 Personal history of nicotine dependence: Secondary | ICD-10-CM | POA: Diagnosis not present

## 2019-07-17 DIAGNOSIS — E782 Mixed hyperlipidemia: Secondary | ICD-10-CM | POA: Diagnosis not present

## 2019-07-17 DIAGNOSIS — I10 Essential (primary) hypertension: Secondary | ICD-10-CM | POA: Diagnosis not present

## 2019-07-18 DIAGNOSIS — S31819D Unspecified open wound of right buttock, subsequent encounter: Secondary | ICD-10-CM | POA: Diagnosis not present

## 2019-07-18 DIAGNOSIS — S31829D Unspecified open wound of left buttock, subsequent encounter: Secondary | ICD-10-CM | POA: Diagnosis not present

## 2019-07-18 DIAGNOSIS — L97921 Non-pressure chronic ulcer of unspecified part of left lower leg limited to breakdown of skin: Secondary | ICD-10-CM | POA: Diagnosis not present

## 2019-07-18 DIAGNOSIS — J449 Chronic obstructive pulmonary disease, unspecified: Secondary | ICD-10-CM | POA: Diagnosis not present

## 2019-07-18 DIAGNOSIS — Z87891 Personal history of nicotine dependence: Secondary | ICD-10-CM | POA: Diagnosis not present

## 2019-07-18 DIAGNOSIS — I87312 Chronic venous hypertension (idiopathic) with ulcer of left lower extremity: Secondary | ICD-10-CM | POA: Diagnosis not present

## 2019-07-25 DIAGNOSIS — I872 Venous insufficiency (chronic) (peripheral): Secondary | ICD-10-CM | POA: Diagnosis not present

## 2019-07-25 DIAGNOSIS — I890 Lymphedema, not elsewhere classified: Secondary | ICD-10-CM | POA: Diagnosis not present

## 2019-07-25 DIAGNOSIS — L97929 Non-pressure chronic ulcer of unspecified part of left lower leg with unspecified severity: Secondary | ICD-10-CM | POA: Diagnosis not present

## 2019-07-25 DIAGNOSIS — L03115 Cellulitis of right lower limb: Secondary | ICD-10-CM | POA: Diagnosis not present

## 2019-07-26 DIAGNOSIS — L03119 Cellulitis of unspecified part of limb: Secondary | ICD-10-CM | POA: Diagnosis not present

## 2019-07-26 DIAGNOSIS — Z23 Encounter for immunization: Secondary | ICD-10-CM | POA: Diagnosis not present

## 2019-07-26 DIAGNOSIS — R609 Edema, unspecified: Secondary | ICD-10-CM | POA: Diagnosis not present

## 2019-08-01 DIAGNOSIS — L97921 Non-pressure chronic ulcer of unspecified part of left lower leg limited to breakdown of skin: Secondary | ICD-10-CM | POA: Diagnosis not present

## 2019-08-01 DIAGNOSIS — Z87891 Personal history of nicotine dependence: Secondary | ICD-10-CM | POA: Diagnosis not present

## 2019-08-01 DIAGNOSIS — S31819D Unspecified open wound of right buttock, subsequent encounter: Secondary | ICD-10-CM | POA: Diagnosis not present

## 2019-08-01 DIAGNOSIS — I87312 Chronic venous hypertension (idiopathic) with ulcer of left lower extremity: Secondary | ICD-10-CM | POA: Diagnosis not present

## 2019-08-01 DIAGNOSIS — J449 Chronic obstructive pulmonary disease, unspecified: Secondary | ICD-10-CM | POA: Diagnosis not present

## 2019-08-01 DIAGNOSIS — S31829D Unspecified open wound of left buttock, subsequent encounter: Secondary | ICD-10-CM | POA: Diagnosis not present

## 2019-08-08 DIAGNOSIS — R609 Edema, unspecified: Secondary | ICD-10-CM | POA: Diagnosis not present

## 2019-08-09 DIAGNOSIS — Z87891 Personal history of nicotine dependence: Secondary | ICD-10-CM | POA: Diagnosis not present

## 2019-08-09 DIAGNOSIS — L97921 Non-pressure chronic ulcer of unspecified part of left lower leg limited to breakdown of skin: Secondary | ICD-10-CM | POA: Diagnosis not present

## 2019-08-09 DIAGNOSIS — S31829D Unspecified open wound of left buttock, subsequent encounter: Secondary | ICD-10-CM | POA: Diagnosis not present

## 2019-08-09 DIAGNOSIS — I87312 Chronic venous hypertension (idiopathic) with ulcer of left lower extremity: Secondary | ICD-10-CM | POA: Diagnosis not present

## 2019-08-09 DIAGNOSIS — S31819D Unspecified open wound of right buttock, subsequent encounter: Secondary | ICD-10-CM | POA: Diagnosis not present

## 2019-08-09 DIAGNOSIS — J449 Chronic obstructive pulmonary disease, unspecified: Secondary | ICD-10-CM | POA: Diagnosis not present

## 2019-08-13 DIAGNOSIS — J449 Chronic obstructive pulmonary disease, unspecified: Secondary | ICD-10-CM | POA: Diagnosis not present

## 2019-08-13 DIAGNOSIS — S31819D Unspecified open wound of right buttock, subsequent encounter: Secondary | ICD-10-CM | POA: Diagnosis not present

## 2019-08-13 DIAGNOSIS — I87312 Chronic venous hypertension (idiopathic) with ulcer of left lower extremity: Secondary | ICD-10-CM | POA: Diagnosis not present

## 2019-08-13 DIAGNOSIS — L97921 Non-pressure chronic ulcer of unspecified part of left lower leg limited to breakdown of skin: Secondary | ICD-10-CM | POA: Diagnosis not present

## 2019-08-13 DIAGNOSIS — Z87891 Personal history of nicotine dependence: Secondary | ICD-10-CM | POA: Diagnosis not present

## 2019-08-13 DIAGNOSIS — S31829D Unspecified open wound of left buttock, subsequent encounter: Secondary | ICD-10-CM | POA: Diagnosis not present

## 2019-08-15 DIAGNOSIS — I872 Venous insufficiency (chronic) (peripheral): Secondary | ICD-10-CM | POA: Diagnosis not present

## 2019-08-15 DIAGNOSIS — L03115 Cellulitis of right lower limb: Secondary | ICD-10-CM | POA: Diagnosis not present

## 2019-08-24 DIAGNOSIS — J449 Chronic obstructive pulmonary disease, unspecified: Secondary | ICD-10-CM | POA: Diagnosis not present

## 2019-08-24 DIAGNOSIS — I87312 Chronic venous hypertension (idiopathic) with ulcer of left lower extremity: Secondary | ICD-10-CM | POA: Diagnosis not present

## 2019-08-24 DIAGNOSIS — L97921 Non-pressure chronic ulcer of unspecified part of left lower leg limited to breakdown of skin: Secondary | ICD-10-CM | POA: Diagnosis not present

## 2019-08-24 DIAGNOSIS — S31829D Unspecified open wound of left buttock, subsequent encounter: Secondary | ICD-10-CM | POA: Diagnosis not present

## 2019-08-24 DIAGNOSIS — S31819D Unspecified open wound of right buttock, subsequent encounter: Secondary | ICD-10-CM | POA: Diagnosis not present

## 2019-08-24 DIAGNOSIS — Z87891 Personal history of nicotine dependence: Secondary | ICD-10-CM | POA: Diagnosis not present

## 2019-08-31 DIAGNOSIS — J449 Chronic obstructive pulmonary disease, unspecified: Secondary | ICD-10-CM | POA: Diagnosis not present

## 2019-08-31 DIAGNOSIS — L97921 Non-pressure chronic ulcer of unspecified part of left lower leg limited to breakdown of skin: Secondary | ICD-10-CM | POA: Diagnosis not present

## 2019-08-31 DIAGNOSIS — I87312 Chronic venous hypertension (idiopathic) with ulcer of left lower extremity: Secondary | ICD-10-CM | POA: Diagnosis not present

## 2019-08-31 DIAGNOSIS — S31829D Unspecified open wound of left buttock, subsequent encounter: Secondary | ICD-10-CM | POA: Diagnosis not present

## 2019-08-31 DIAGNOSIS — S31819D Unspecified open wound of right buttock, subsequent encounter: Secondary | ICD-10-CM | POA: Diagnosis not present

## 2019-08-31 DIAGNOSIS — Z87891 Personal history of nicotine dependence: Secondary | ICD-10-CM | POA: Diagnosis not present

## 2019-09-05 DIAGNOSIS — Z872 Personal history of diseases of the skin and subcutaneous tissue: Secondary | ICD-10-CM | POA: Diagnosis not present

## 2019-09-05 DIAGNOSIS — I872 Venous insufficiency (chronic) (peripheral): Secondary | ICD-10-CM | POA: Diagnosis not present

## 2019-09-06 DIAGNOSIS — I872 Venous insufficiency (chronic) (peripheral): Secondary | ICD-10-CM | POA: Diagnosis not present

## 2019-09-06 DIAGNOSIS — E782 Mixed hyperlipidemia: Secondary | ICD-10-CM | POA: Diagnosis not present

## 2019-09-06 DIAGNOSIS — S31829D Unspecified open wound of left buttock, subsequent encounter: Secondary | ICD-10-CM | POA: Diagnosis not present

## 2019-09-06 DIAGNOSIS — K7581 Nonalcoholic steatohepatitis (NASH): Secondary | ICD-10-CM | POA: Diagnosis not present

## 2019-09-06 DIAGNOSIS — J449 Chronic obstructive pulmonary disease, unspecified: Secondary | ICD-10-CM | POA: Diagnosis not present

## 2019-09-06 DIAGNOSIS — I10 Essential (primary) hypertension: Secondary | ICD-10-CM | POA: Diagnosis not present

## 2019-09-06 DIAGNOSIS — I739 Peripheral vascular disease, unspecified: Secondary | ICD-10-CM | POA: Diagnosis not present

## 2019-09-06 DIAGNOSIS — R7301 Impaired fasting glucose: Secondary | ICD-10-CM | POA: Diagnosis not present

## 2019-09-06 DIAGNOSIS — D500 Iron deficiency anemia secondary to blood loss (chronic): Secondary | ICD-10-CM | POA: Diagnosis not present

## 2019-09-06 DIAGNOSIS — R4582 Worries: Secondary | ICD-10-CM | POA: Diagnosis not present

## 2019-09-06 DIAGNOSIS — I87312 Chronic venous hypertension (idiopathic) with ulcer of left lower extremity: Secondary | ICD-10-CM | POA: Diagnosis not present

## 2019-09-06 DIAGNOSIS — Z1331 Encounter for screening for depression: Secondary | ICD-10-CM | POA: Diagnosis not present

## 2019-09-06 DIAGNOSIS — K580 Irritable bowel syndrome with diarrhea: Secondary | ICD-10-CM | POA: Diagnosis not present

## 2019-09-06 DIAGNOSIS — Z1212 Encounter for screening for malignant neoplasm of rectum: Secondary | ICD-10-CM | POA: Diagnosis not present

## 2019-09-06 DIAGNOSIS — Z23 Encounter for immunization: Secondary | ICD-10-CM | POA: Diagnosis not present

## 2019-09-06 DIAGNOSIS — Z9189 Other specified personal risk factors, not elsewhere classified: Secondary | ICD-10-CM | POA: Diagnosis not present

## 2019-09-06 DIAGNOSIS — N189 Chronic kidney disease, unspecified: Secondary | ICD-10-CM | POA: Diagnosis not present

## 2019-09-06 DIAGNOSIS — Z87891 Personal history of nicotine dependence: Secondary | ICD-10-CM | POA: Diagnosis not present

## 2019-09-06 DIAGNOSIS — F331 Major depressive disorder, recurrent, moderate: Secondary | ICD-10-CM | POA: Diagnosis not present

## 2019-09-06 DIAGNOSIS — L97921 Non-pressure chronic ulcer of unspecified part of left lower leg limited to breakdown of skin: Secondary | ICD-10-CM | POA: Diagnosis not present

## 2019-09-06 DIAGNOSIS — Z1389 Encounter for screening for other disorder: Secondary | ICD-10-CM | POA: Diagnosis not present

## 2019-09-06 DIAGNOSIS — S31819D Unspecified open wound of right buttock, subsequent encounter: Secondary | ICD-10-CM | POA: Diagnosis not present

## 2019-09-08 DIAGNOSIS — F419 Anxiety disorder, unspecified: Secondary | ICD-10-CM | POA: Diagnosis not present

## 2019-09-08 DIAGNOSIS — Z79899 Other long term (current) drug therapy: Secondary | ICD-10-CM | POA: Diagnosis not present

## 2019-09-08 DIAGNOSIS — S8991XA Unspecified injury of right lower leg, initial encounter: Secondary | ICD-10-CM | POA: Diagnosis not present

## 2019-09-08 DIAGNOSIS — I10 Essential (primary) hypertension: Secondary | ICD-10-CM | POA: Diagnosis not present

## 2019-09-08 DIAGNOSIS — Z880 Allergy status to penicillin: Secondary | ICD-10-CM | POA: Diagnosis not present

## 2019-09-08 DIAGNOSIS — F329 Major depressive disorder, single episode, unspecified: Secondary | ICD-10-CM | POA: Diagnosis not present

## 2019-09-08 DIAGNOSIS — S81811A Laceration without foreign body, right lower leg, initial encounter: Secondary | ICD-10-CM | POA: Diagnosis not present

## 2019-09-08 DIAGNOSIS — R457 State of emotional shock and stress, unspecified: Secondary | ICD-10-CM | POA: Diagnosis not present

## 2019-09-08 DIAGNOSIS — K219 Gastro-esophageal reflux disease without esophagitis: Secondary | ICD-10-CM | POA: Diagnosis not present

## 2019-09-08 DIAGNOSIS — Z87891 Personal history of nicotine dependence: Secondary | ICD-10-CM | POA: Diagnosis not present

## 2019-09-08 DIAGNOSIS — W268XXA Contact with other sharp object(s), not elsewhere classified, initial encounter: Secondary | ICD-10-CM | POA: Diagnosis not present

## 2019-09-08 DIAGNOSIS — R58 Hemorrhage, not elsewhere classified: Secondary | ICD-10-CM | POA: Diagnosis not present

## 2019-09-13 DIAGNOSIS — N8110 Cystocele, unspecified: Secondary | ICD-10-CM | POA: Diagnosis not present

## 2019-09-13 DIAGNOSIS — N993 Prolapse of vaginal vault after hysterectomy: Secondary | ICD-10-CM | POA: Diagnosis not present

## 2019-09-15 DIAGNOSIS — J449 Chronic obstructive pulmonary disease, unspecified: Secondary | ICD-10-CM | POA: Diagnosis not present

## 2019-09-15 DIAGNOSIS — E782 Mixed hyperlipidemia: Secondary | ICD-10-CM | POA: Diagnosis not present

## 2019-09-18 DIAGNOSIS — S81811A Laceration without foreign body, right lower leg, initial encounter: Secondary | ICD-10-CM | POA: Diagnosis not present

## 2019-09-26 DIAGNOSIS — Z872 Personal history of diseases of the skin and subcutaneous tissue: Secondary | ICD-10-CM | POA: Diagnosis not present

## 2019-09-26 DIAGNOSIS — I872 Venous insufficiency (chronic) (peripheral): Secondary | ICD-10-CM | POA: Diagnosis not present

## 2019-09-26 DIAGNOSIS — S81811D Laceration without foreign body, right lower leg, subsequent encounter: Secondary | ICD-10-CM | POA: Diagnosis not present

## 2019-09-27 DIAGNOSIS — N993 Prolapse of vaginal vault after hysterectomy: Secondary | ICD-10-CM | POA: Diagnosis not present

## 2019-09-27 DIAGNOSIS — Z4689 Encounter for fitting and adjustment of other specified devices: Secondary | ICD-10-CM | POA: Diagnosis not present

## 2019-11-16 DIAGNOSIS — J449 Chronic obstructive pulmonary disease, unspecified: Secondary | ICD-10-CM | POA: Diagnosis not present

## 2019-11-16 DIAGNOSIS — E782 Mixed hyperlipidemia: Secondary | ICD-10-CM | POA: Diagnosis not present

## 2019-12-08 DIAGNOSIS — E876 Hypokalemia: Secondary | ICD-10-CM | POA: Diagnosis not present

## 2019-12-08 DIAGNOSIS — K219 Gastro-esophageal reflux disease without esophagitis: Secondary | ICD-10-CM | POA: Diagnosis not present

## 2019-12-08 DIAGNOSIS — N183 Chronic kidney disease, stage 3 unspecified: Secondary | ICD-10-CM | POA: Diagnosis not present

## 2019-12-08 DIAGNOSIS — D649 Anemia, unspecified: Secondary | ICD-10-CM | POA: Diagnosis not present

## 2019-12-08 DIAGNOSIS — E782 Mixed hyperlipidemia: Secondary | ICD-10-CM | POA: Diagnosis not present

## 2019-12-08 DIAGNOSIS — R5383 Other fatigue: Secondary | ICD-10-CM | POA: Diagnosis not present

## 2019-12-08 DIAGNOSIS — D529 Folate deficiency anemia, unspecified: Secondary | ICD-10-CM | POA: Diagnosis not present

## 2019-12-08 DIAGNOSIS — D519 Vitamin B12 deficiency anemia, unspecified: Secondary | ICD-10-CM | POA: Diagnosis not present

## 2019-12-13 DIAGNOSIS — J449 Chronic obstructive pulmonary disease, unspecified: Secondary | ICD-10-CM | POA: Diagnosis not present

## 2019-12-13 DIAGNOSIS — I10 Essential (primary) hypertension: Secondary | ICD-10-CM | POA: Diagnosis not present

## 2019-12-13 DIAGNOSIS — K7581 Nonalcoholic steatohepatitis (NASH): Secondary | ICD-10-CM | POA: Diagnosis not present

## 2019-12-13 DIAGNOSIS — F331 Major depressive disorder, recurrent, moderate: Secondary | ICD-10-CM | POA: Diagnosis not present

## 2019-12-13 DIAGNOSIS — I872 Venous insufficiency (chronic) (peripheral): Secondary | ICD-10-CM | POA: Diagnosis not present

## 2019-12-13 DIAGNOSIS — R4582 Worries: Secondary | ICD-10-CM | POA: Diagnosis not present

## 2019-12-13 DIAGNOSIS — R7301 Impaired fasting glucose: Secondary | ICD-10-CM | POA: Diagnosis not present

## 2019-12-15 DIAGNOSIS — D649 Anemia, unspecified: Secondary | ICD-10-CM | POA: Diagnosis not present

## 2019-12-15 DIAGNOSIS — Z9889 Other specified postprocedural states: Secondary | ICD-10-CM | POA: Insufficient documentation

## 2019-12-15 DIAGNOSIS — K4090 Unilateral inguinal hernia, without obstruction or gangrene, not specified as recurrent: Secondary | ICD-10-CM | POA: Diagnosis not present

## 2019-12-15 DIAGNOSIS — Z8719 Personal history of other diseases of the digestive system: Secondary | ICD-10-CM | POA: Insufficient documentation

## 2019-12-28 DIAGNOSIS — Z4689 Encounter for fitting and adjustment of other specified devices: Secondary | ICD-10-CM | POA: Diagnosis not present

## 2019-12-28 DIAGNOSIS — N993 Prolapse of vaginal vault after hysterectomy: Secondary | ICD-10-CM | POA: Diagnosis not present

## 2020-01-01 DIAGNOSIS — N993 Prolapse of vaginal vault after hysterectomy: Secondary | ICD-10-CM | POA: Diagnosis not present

## 2020-01-18 DIAGNOSIS — M25551 Pain in right hip: Secondary | ICD-10-CM | POA: Diagnosis not present

## 2020-01-24 DIAGNOSIS — D649 Anemia, unspecified: Secondary | ICD-10-CM | POA: Diagnosis not present

## 2020-01-24 DIAGNOSIS — D529 Folate deficiency anemia, unspecified: Secondary | ICD-10-CM | POA: Diagnosis not present

## 2020-01-24 DIAGNOSIS — D519 Vitamin B12 deficiency anemia, unspecified: Secondary | ICD-10-CM | POA: Diagnosis not present

## 2020-02-20 ENCOUNTER — Encounter (HOSPITAL_COMMUNITY): Payer: Self-pay

## 2020-02-20 NOTE — Patient Instructions (Addendum)
DUE TO COVID-19 ONLY ONE VISITOR IS ALLOWED TO COME WITH YOU AND STAY IN THE WAITING ROOM ONLY DURING PRE OP AND PROCEDURE DAY OF SURGERY. TWO  VISITOR MAY VISIT WITH YOU AFTER SURGERY IN YOUR PRIVATE ROOM DURING VISITING HOURS ONLY!   10-a-8p  YOU NEED TO HAVE A COVID 19 TEST ON__4-10-21_____ @__10 :00_____, THIS TEST MUST BE DONE BEFORE SURGERY, COME  [address] , [id number].  ([place]) ONCE YOUR COVID TEST IS COMPLETED, PLEASE BEGIN THE QUARANTINE INSTRUCTIONS AS OUTLINED IN YOUR HANDOUT.                Stacy Case  02/20/2020   Your procedure is scheduled on: 02-28-20   Report to [facility] Main  Entrance   Report to admitting at      1110  AM     Call this number if you have problems the morning of surgery [phone]    Remember: NO SOLID FOOD AFTER MIDNIGHT THE NIGHT PRIOR TO SURGERY. NOTHING BY MOUTH EXCEPT CLEAR LIQUIDS UNTIL       0840 am . PLEASE FINISH ENSURE DRINK PER SURGEON ORDER  WHICH NEEDS TO BE COMPLETED AT         0840 am then nothing by mouth  .    CLEAR LIQUID DIET   Till 0840 am    Foods Allowed                                                                                         Foods Excluded  Coffee and tea, regular and decaf    No creamer                                       liquids that you cannot  Plain Jell-O any favor except red or purple                                            see through such as: Fruit ices (not with fruit pulp)                                                                      milk, soups, orange juice  Iced Popsicles                                                                        All solid food Carbonated beverages, regular and diet  Cranberry, grape and apple juices Sports drinks like Gatorade Lightly seasoned clear broth or consume(fat free) Sugar, honey  syrup   _____________________________________________________________________   BRUSH YOUR TEETH MORNING OF SURGERY AND RINSE YOUR MOUTH OUT, NO CHEWING GUM CANDY OR MINTS.     Take these medicines the morning of surgery with A SIP OF WATER: protonix, claritin, dilitiazem, lipitor, atenelol                                 You may not have any metal on your body including hair pins and              piercings  Do not wear jewelry, make-up, lotions, powders or perfumes, deodorant             Do not wear nail polish on your fingernails.  Do not shave  48 hours prior to surgery.     Do not bring valuables to the hospital. [place].  Contacts, dentures or bridgework may not be worn into surgery.               Please read over the following fact sheets you were given: _____________________________________________________________________          [facility] - Preparing for Surgery Before surgery, you can play an important role.  Because skin is not sterile, your skin needs to be as free of germs as possible.  You can reduce the number of germs on your skin by washing with CHG (chlorahexidine gluconate) soap before surgery.  CHG is an antiseptic cleaner which kills germs and bonds with the skin to continue killing germs even after washing. Please DO NOT use if you have an allergy to CHG or antibacterial soaps.  If your skin becomes reddened/irritated stop using the CHG and inform your nurse when you arrive at Short Stay. Do not shave (including legs and underarms) for at least 48 hours prior to the first CHG shower.  You may shave your face/neck. Please follow these instructions carefully:  1.  Shower with CHG Soap the night before surgery and the  morning of Surgery.  2.  If you choose to wash your hair, wash your hair first as usual with your  normal  shampoo.  3.  After you shampoo, rinse your hair and body thoroughly to remove the   shampoo.                           4.  Use CHG as you would any other liquid soap.  You can apply chg directly  to the skin and wash                       Gently with a scrungie or clean washcloth.  5.  Apply the CHG Soap to your body ONLY FROM THE NECK DOWN.   Do not use on face/ open                           Wound or open sores. Avoid contact with eyes, ears mouth and genitals (private parts).                       Wash face,  Development worker, international aid (private  parts) with your normal soap.             6.  Wash thoroughly, paying special attention to the area where your surgery  will be performed.  7.  Thoroughly rinse your body with warm water from the neck down.  8.  DO NOT shower/wash with your normal soap after using and rinsing off  the CHG Soap.                9.  Pat yourself dry with a clean towel.            10.  Wear clean pajamas.            11.  Place clean sheets on your bed the night of your first shower and do not  sleep with pets. Day of Surgery : Do not apply any lotions/deodorants the morning of surgery.  Please wear clean clothes to the hospital/surgery center.  FAILURE TO FOLLOW THESE INSTRUCTIONS MAY RESULT IN THE CANCELLATION OF YOUR SURGERY PATIENT SIGNATURE_________________________________  NURSE SIGNATURE__________________________________  ________________________________________________________________________   Stacy Case  An incentive spirometer is a tool that can help keep your lungs clear and active. This tool measures how well you are filling your lungs with each breath. Taking long deep breaths may help reverse or decrease the chance of developing breathing (pulmonary) problems (especially infection) following:  A long period of time when you are unable to move or be active. BEFORE THE PROCEDURE   If the spirometer includes an indicator to show your best effort, your nurse or respiratory therapist will set it to a desired goal.  If possible, sit up straight  or lean slightly forward. Try not to slouch.  Hold the incentive spirometer in an upright position. INSTRUCTIONS FOR USE  1. Sit on the edge of your bed if possible, or sit up as far as you can in bed or on a chair. 2. Hold the incentive spirometer in an upright position. 3. Breathe out normally. 4. Place the mouthpiece in your mouth and seal your lips tightly around it. 5. Breathe in slowly and as deeply as possible, raising the piston or the ball toward the top of the column. 6. Hold your breath for 3-5 seconds or for as long as possible. Allow the piston or ball to fall to the bottom of the column. 7. Remove the mouthpiece from your mouth and breathe out normally. 8. Rest for a few seconds and repeat Steps 1 through 7 at least 10 times every 1-2 hours when you are awake. Take your time and take a few normal breaths between deep breaths. 9. The spirometer may include an indicator to show your best effort. Use the indicator as a goal to work toward during each repetition. 10. After each set of 10 deep breaths, practice coughing to be sure your lungs are clear. If you have an incision (the cut made at the time of surgery), support your incision when coughing by placing a pillow or rolled up towels firmly against it. Once you are able to get out of bed, walk around indoors and cough well. You may stop using the incentive spirometer when instructed by your caregiver.  RISKS AND COMPLICATIONS  Take your time so you do not get dizzy or light-headed.  If you are in pain, you may need to take or ask for pain medication before doing incentive spirometry. It is harder to take a deep breath if you are having pain. AFTER USE  Rest and  breathe slowly and easily.  It can be helpful to keep track of a log of your progress. Your caregiver can provide you with a simple table to help with this. If you are using the spirometer at home, follow these instructions: [place] IF:   You are having  difficultly using the spirometer.  You have trouble using the spirometer as often as instructed.  Your pain medication is not giving enough relief while using the spirometer.  You develop fever of 100.5 F (38.1 C) or higher. SEEK IMMEDIATE MEDICAL CARE IF:   You cough up bloody sputum that had not been present before.  You develop fever of 102 F (38.9 C) or greater.  You develop worsening pain at or near the incision site. MAKE SURE YOU:   Understand these instructions.  Will watch your condition.  Will get help right away if you are not doing well or get worse. Document Released: 03/15/2007 Document Revised: 01/25/2012 Document Reviewed: 05/16/2007 Exit[employer] Patient Information 2014 [employer], [place].   ________________________________________________________________________  WHAT IS A BLOOD TRANSFUSION? Blood Transfusion Information  A transfusion is the replacement of blood or some of its parts. Blood is made up of multiple cells which provide different functions.  Red blood cells carry oxygen and are used for blood loss replacement.  White blood cells fight against infection.  Platelets control bleeding.  Plasma helps clot blood.  Other blood products are available for specialized needs, such as hemophilia or other clotting disorders. BEFORE THE TRANSFUSION  Who gives blood for transfusions?   Healthy volunteers who are fully evaluated to make sure their blood is safe. This is blood bank blood. Transfusion therapy is the safest it has ever been in the practice of medicine. Before blood is taken from a donor, a complete history is taken to make sure that person has no history of diseases nor engages in risky social behavior (examples are intravenous drug use or sexual activity with multiple partners). The donor's travel history is screened to minimize risk of transmitting infections, such as malaria. The donated blood is tested for signs of infectious diseases, such as  HIV and hepatitis. The blood is then tested to be sure it is compatible with you in order to minimize the chance of a transfusion reaction. If you or a relative donates blood, this is often done in anticipation of surgery and is not appropriate for emergency situations. It takes many days to process the donated blood. RISKS AND COMPLICATIONS Although transfusion therapy is very safe and saves many lives, the main dangers of transfusion include:   Getting an infectious disease.  Developing a transfusion reaction. This is an allergic reaction to something in the blood you were given. Every precaution is taken to prevent this. The decision to have a blood transfusion has been considered carefully by your caregiver before blood is given. Blood is not given unless the benefits outweigh the risks. AFTER THE TRANSFUSION  Right after receiving a blood transfusion, you will usually feel much better and more energetic. This is especially true if your red blood cells have gotten low (anemic). The transfusion raises the level of the red blood cells which carry oxygen, and this usually causes an energy increase.  The nurse administering the transfusion will monitor you carefully for complications. HOME CARE INSTRUCTIONS  No special instructions are needed after a transfusion. You may find your energy is better. Speak with your caregiver about any limitations on activity for underlying diseases you may have. [place]  CARE IF:   Your condition is not improving after your transfusion.  You develop redness or irritation at the intravenous (IV) site. SEEK IMMEDIATE MEDICAL CARE IF:  Any of the following symptoms occur over the next 12 hours:  Shaking chills.  You have a temperature by mouth above 102 F (38.9 C), not controlled by medicine.  Chest, back, or muscle pain.  People around you feel you are not acting correctly or are confused.  Shortness of breath or difficulty breathing.  Dizziness  and fainting.  You get a rash or develop hives.  You have a decrease in urine output.  Your urine turns a dark color or changes to pink, red, or brown. Any of the following symptoms occur over the next 10 days:  You have a temperature by mouth above 102 F (38.9 C), not controlled by medicine.  Shortness of breath.  Weakness after normal activity.  The white part of the eye turns yellow (jaundice).  You have a decrease in the amount of urine or are urinating less often.  Your urine turns a dark color or changes to pink, red, or brown. Document Released: 10/30/2000 Document Revised: 01/25/2012 Document Reviewed: 06/18/2008 [facility] Patient Information 2014 [place], [place].  _______________________________________________________________________

## 2020-02-21 ENCOUNTER — Encounter (HOSPITAL_COMMUNITY): Payer: Self-pay

## 2020-02-21 ENCOUNTER — Encounter (HOSPITAL_COMMUNITY)
Admission: RE | Admit: 2020-02-21 | Discharge: 2020-02-21 | Disposition: A | Discharge status: Home / Self Care | Payer: Medicare Other | Source: Ambulatory Visit | Attending: Orthopedic Surgery | Admitting: Orthopedic Surgery

## 2020-02-21 ENCOUNTER — Other Ambulatory Visit: Payer: Self-pay

## 2020-02-21 DIAGNOSIS — J449 Chronic obstructive pulmonary disease, unspecified: Secondary | ICD-10-CM | POA: Diagnosis not present

## 2020-02-21 DIAGNOSIS — Z01818 Encounter for other preprocedural examination: Secondary | ICD-10-CM | POA: Insufficient documentation

## 2020-02-21 DIAGNOSIS — E785 Hyperlipidemia, unspecified: Secondary | ICD-10-CM | POA: Diagnosis not present

## 2020-02-21 DIAGNOSIS — I10 Essential (primary) hypertension: Secondary | ICD-10-CM | POA: Diagnosis not present

## 2020-02-21 DIAGNOSIS — Z79899 Other long term (current) drug therapy: Secondary | ICD-10-CM | POA: Insufficient documentation

## 2020-02-21 DIAGNOSIS — Z87891 Personal history of nicotine dependence: Secondary | ICD-10-CM | POA: Diagnosis not present

## 2020-02-21 DIAGNOSIS — M1611 Unilateral primary osteoarthritis, right hip: Secondary | ICD-10-CM | POA: Insufficient documentation

## 2020-02-21 DIAGNOSIS — K219 Gastro-esophageal reflux disease without esophagitis: Secondary | ICD-10-CM | POA: Diagnosis not present

## 2020-02-21 HISTORY — DX: Unilateral inguinal hernia, without obstruction or gangrene, not specified as recurrent: K40.90

## 2020-02-21 HISTORY — DX: Unspecified osteoarthritis, unspecified site: M19.90

## 2020-02-21 HISTORY — DX: Chronic obstructive pulmonary disease, unspecified: J44.9

## 2020-02-21 HISTORY — DX: Gastro-esophageal reflux disease without esophagitis: K21.9

## 2020-02-21 HISTORY — DX: Pneumonia, unspecified organism: J18.9

## 2020-02-21 HISTORY — DX: Headache, unspecified: R51.9

## 2020-02-21 LAB — COMPREHENSIVE METABOLIC PANEL
ALT: 12 U/L (ref 0–44)
AST: 18 U/L (ref 15–41)
Albumin: 4.1 g/dL (ref 3.5–5.0)
Alkaline Phosphatase: 109 U/L (ref 38–126)
Anion gap: 11 (ref 5–15)
BUN: 25 mg/dL — ABNORMAL HIGH (ref 8–23)
CO2: 26 mmol/L (ref 22–32)
Calcium: 9.5 mg/dL (ref 8.9–10.3)
Chloride: 102 mmol/L (ref 98–111)
Creatinine, Ser: 0.85 mg/dL (ref 0.44–1.00)
GFR calc Af Amer: >60 mL/min (ref >60)
GFR calc non Af Amer: >60 mL/min (ref >60)
Glucose, Bld: 116 mg/dL — ABNORMAL HIGH (ref 70–99)
Potassium: 4.8 mmol/L (ref 3.5–5.1)
Sodium: 139 mmol/L (ref 135–145)
Total Bilirubin: 0.5 mg/dL (ref 0.3–1.2)
Total Protein: 7.8 g/dL (ref 6.5–8.1)

## 2020-02-21 LAB — ABO/RH: ABO/RH(D): O POS

## 2020-02-21 LAB — SURGICAL PCR SCREEN
MRSA, PCR: NEGATIVE
Staphylococcus aureus: NEGATIVE

## 2020-02-21 LAB — CBC
HCT: 36.5 % (ref 36.0–46.0)
Hemoglobin: 10.6 g/dL — ABNORMAL LOW (ref 12.0–15.0)
MCH: 22.9 pg — ABNORMAL LOW (ref 26.0–34.0)
MCHC: 29 g/dL — ABNORMAL LOW (ref 30.0–36.0)
MCV: 79 fL — ABNORMAL LOW (ref 80.0–100.0)
Platelets: 376 10*3/uL (ref 150–400)
RBC: 4.62 MIL/uL (ref 3.87–5.11)
RDW: 17.4 % — ABNORMAL HIGH (ref 11.5–15.5)
WBC: 7.2 10*3/uL (ref 4.0–10.5)
nRBC: 0 % (ref 0.0–0.2)

## 2020-02-21 LAB — PROTIME-INR
INR: 1 (ref 0.8–1.2)
Prothrombin Time: 12.7 seconds (ref 11.4–15.2)

## 2020-02-21 LAB — APTT: aPTT: 32 seconds (ref 24–36)

## 2020-02-21 NOTE — H&P (Signed)
TOTAL HIP ADMISSION H&P  Patient is admitted for right total hip arthroplasty.  Subjective:  Chief Complaint: Right hip pain  HPI: Stacy Case, 69 y.o. female, has a history of pain and functional disability in the right hip due to arthritis and patient has failed non-surgical conservative treatments for greater than 12 weeks to include use of assistive devices and activity modification. Onset of symptoms was gradual, starting 3 years ago with gradually worsening course since that time. The patient noted no past surgery on the right hip. Patient currently rates pain in the right hip at 8 out of 10 with activity. Patient has worsening of pain with activity and weight bearing, pain that interfers with activities of daily living and instability. 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 This condition presents safety issues increasing the risk of falls. There is no current active infection.  Patient Active Problem List   Diagnosis Date Noted  . Atherosclerotic peripheral vascular disease with ulceration ([place]) 11/15/2013    Past Medical History:  Diagnosis Date  . Anemia   . Anxiety   . Arthritis   . Cellulitis   . Cellulitis of ankle   . COPD (chronic obstructive pulmonary disease) ([place])   . Cystocele   . Depression   . GERD (gastroesophageal reflux disease)   . Headache    hx of migraines  . Heart murmur   . Hyperlipidemia   . Hypertension   . IBS (irritable bowel syndrome)   . Peripheral arterial disease ([facility])    legs  . Pneumonia   . Right inguinal hernia     Past Surgical History:  Procedure Laterality Date  . ABDOMINAL AORTAGRAM N/A 11/20/2013   Procedure: ABDOMINAL Maxcine Ham;  Surgeon: Angelia Mould, MD;  Location: [facility] CATH LAB;  Service: Cardiovascular;  Laterality: N/A;  . APPENDECTOMY    . JOINT REPLACEMENT     Left hip 2018  . WRIST SURGERY      Prior to  Admission medications   Medication Sig Start Date End Date Taking? Authorizing Provider  acetaminophen (TYLENOL) 500 MG tablet Take 1,000 mg by mouth every 6 (six) hours as needed for moderate pain.   Yes [provider]  alendronate (FOSAMAX) 70 MG tablet Take 70 mg by mouth every 7 (seven) days. 09/03/19  Yes [provider]  ascorbic acid (VITAMIN C) 500 MG tablet Take 500 mg by mouth daily.   Yes [provider]  aspirin 81 MG tablet Take 81 mg by mouth daily.   Yes [provider]  atenolol (TENORMIN) 50 MG tablet Take 50 mg by mouth daily.   Yes [provider]  atorvastatin (LIPITOR) 40 MG tablet Take 40 mg by mouth daily. 12/04/19  Yes [provider]  Cholecalciferol (VITAMIN D3) 10 MCG (400 UNIT) CAPS Take 400 Units by mouth daily.   Yes [provider]  dicyclomine (BENTYL) 20 MG tablet Take 20 mg by mouth 4 (four) times daily - after meals and at bedtime. 12/17/19  Yes [provider]  diltiazem (TIAZAC) 240 MG 24 hr capsule Take 240 mg by mouth daily. 08/08/19  Yes [provider]  furosemide (LASIX) 20 MG tablet Take 20 mg by mouth daily.   Yes [provider]  iron polysaccharides (NIFEREX) 150 MG capsule Take 150 mg by mouth 2 (two) times daily.   Yes [provider]  mirtazapine (REMERON) 30 MG tablet Take  30 mg by mouth at bedtime.   Yes [provider]  Multiple Vitamin (MULTIVITAMIN WITH MINERALS) TABS tablet Take 1 tablet by mouth daily.   Yes [provider]  ondansetron (ZOFRAN) 8 MG tablet Take 8 mg by mouth every 8 (eight) hours as needed for nausea or vomiting.   Yes [provider]  pantoprazole (PROTONIX) 40 MG tablet Take 40 mg by mouth daily.   Yes [provider]  silver sulfADIAZINE (SILVADENE) 1 % cream Apply 1 application topically daily as needed (irritation).   Yes [provider]  traMADol (ULTRAM) 50 MG tablet Take 100 mg  by mouth every 6 (six) hours as needed for moderate pain.   Yes [provider]  loratadine (CLARITIN) 10 MG tablet Take 10 mg by mouth daily.    [provider]  oxyCODONE-acetaminophen (ROXICET) 5-325 MG per tablet Take 1-2 tablets by mouth every 4 (four) hours as needed for severe pain. Patient not taking: Reported on 02/20/2020 12/13/13   Angelia Mould, MD    Allergies  Allergen Reactions  . Penicillins Rash    Social History   Socioeconomic History  . Marital status: Single    Spouse name: Not on file  . Number of children: Not on file  . Years of education: Not on file  . Highest education level: Not on file  Occupational History  . Not on file  Tobacco Use  . Smoking status: Former Smoker    Packs/day: 1.00    Years: 40.00    Pack years: 40.00    Types: Cigarettes    Quit date: 02/20/2017    Years since quitting: 3.0  . Smokeless tobacco: Never Used  Substance and Sexual Activity  . Alcohol use: No  . Drug use: No  . Sexual activity: Not Currently  Other Topics Concern  . Not on file  Social History Narrative  . Not on file   Social Determinants of Health   Financial Resource Strain:   . Difficulty of Paying Living Expenses:   Food Insecurity:   . Worried About Charity fundraiser in the Last Year:   . Arboriculturist in the Last Year:   Transportation Needs:   . Film/video editor (Medical):   Marland Kitchen Lack of Transportation (Non-Medical):   Physical Activity:   . Days of Exercise per Week:   . Minutes of Exercise per Session:   Stress:   . Feeling of Stress :   Social Connections:   . Frequency of Communication with Friends and Family:   . Frequency of Social Gatherings with Friends and Family:   . Attends Religious Services:   . Active Member of Clubs or Organizations:   . Attends Archivist Meetings:   Marland Kitchen Marital Status:   Intimate Partner Violence:   . Fear of Current or Ex-Partner:   . Emotionally Abused:   Marland Kitchen  Physically Abused:   . Sexually Abused:       Tobacco Use: Medium Risk  . Smoking Tobacco Use: Former Smoker  . Smokeless Tobacco Use: Never Used   Social History   Substance and Sexual Activity  Alcohol Use No    Family History  Problem Relation Age of Onset  . Varicose Veins Mother   . Clotting disorder Mother   . Diabetes Brother   . Hypertension Brother   . Diabetes Daughter   . Hyperlipidemia Daughter   . Hypertension Daughter     Review of Systems  Constitutional: Negative for chills and fever.  HENT: Negative for congestion, sore throat and tinnitus.   Eyes: Negative for double vision, photophobia and pain.  Respiratory: Negative for cough, shortness of breath and wheezing.   Cardiovascular: Negative for chest pain, palpitations and orthopnea.  Gastrointestinal: Negative for heartburn, nausea and vomiting.  Genitourinary: Negative for dysuria, frequency and urgency.  Musculoskeletal: Positive for joint pain.  Neurological: Negative for dizziness, weakness and headaches.     Objective:  Physical Exam: Well nourished and well developed.  General: Alert and oriented x3, cooperative and pleasant, no acute distress.  Head: normocephalic, atraumatic, neck supple.  Eyes: EOMI.  Respiratory: breath sounds clear in all fields, no wheezing, rales, or rhonchi. Cardiovascular: Regular rate and rhythm, no murmurs, gallops or rubs.  Abdomen: non-tender to palpation and soft, normoactive bowel sounds. Musculoskeletal:  Right Hip Exam: Range of Motion: Flexion is to 90 degrees. No internal or external rotation. Abduction is to 20 degrees with pain. She has a leg length discrepancy that is at least 1/5 to 3/4 of an inch on the right compared to the left.  There is no tenderness over the greater trochanteric bursa.  There is no pain on provocative testing of the hip.  Calves soft and nontender. Motor function intact in LE. Strength 5/5 LE bilaterally. Neuro: Distal  pulses 2+. Sensation to light touch intact in LE.  Vital signs in last 24 hours: Weight:  [54 kg] 54 kg (04/07 0958)  Blood pressure: 128/84 mmHg  Imaging Review Plain radiographs demonstrate severe degenerative joint disease of the right hip. The bone quality appears to be adequate for age and reported activity level.  Assessment/Plan:  End stage arthritis, right hip  The patient history, physical examination, clinical judgement of the provider and imaging studies are consistent with end stage degenerative joint disease of the right hip and total hip arthroplasty is deemed medically necessary. The treatment options including medical management, injection therapy, arthroscopy and arthroplasty were discussed at length. The risks and benefits of total hip arthroplasty were presented and reviewed. The risks due to aseptic loosening, infection, stiffness, dislocation/subluxation, thromboembolic complications and other imponderables were discussed. The patient acknowledged the explanation, agreed to proceed with the plan and consent was signed. Patient is being admitted for inpatient treatment for surgery, pain control, PT, OT, prophylactic antibiotics, VTE prophylaxis, progressive ambulation and ADLs and discharge planning.The patient is planning to be discharged home.  Anticipated LOS equal to or greater than 2 midnights due to - Age 55 and older with one or more of the following:  - Obesity  - Expected need for hospital services (PT, OT, Nursing) required for safe  discharge  - Anticipated need for postoperative skilled nursing care or inpatient rehab  - Active co-morbidities: Respiratory Failure/COPD OR   - Unanticipated findings during/Post Surgery: None  - Patient is a high risk of re-admission due to: None   Therapy Plans: HEP Disposition: Home with daughter Planned DVT Prophylaxis: Aspirin 325 mg BID DME Needed: Gilford Rile, 3-in-1 PCP: Gar Ponto, MD TXA: IV Allergies: PCN (rash),  clindamycin (hx of C. diff) Anesthesia Concerns: None BMI: 21.9  Other:  - Hx of C. diff with previous left THA, was in ICU for several days. 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 - Follow-up visit in 2 weeks with Dr. Wynelle Link -  Begin physical therapy following surgery - Pre-operative lab work as pre-surgical testing - Prescriptions will be provided in hospital at time of discharge  Theresa Duty, PA-C Orthopedic Surgery [facility] [place]

## 2020-02-21 NOTE — Progress Notes (Signed)
PCP - Dr. Gar Ponto Cardiologist -   Chest x-ray -  EKG - 02-21-20 Stress Test -  ECHO - [facility] Cardiac Cath -   Sleep Study -  CPAP -   Fasting Blood Sugar -  Checks Blood Sugar _____ times a day  Blood Thinner Instructions: Aspirin Instructions:81 mg stop 7 days prior to surgery Last Dose:  Anesthesia review: COPD, c/o generalized rash and hands peeling, also complains of old wound back of ankle she keeps wrapped up because it gets tender .Sees her PCP 4-8 ., hx PVD , abn ekg  Patient denies shortness of breath, fever, cough and chest pain at PAT appointment   NONE   Patient verbalized understanding of instructions that were given to them at the PAT appointment. Patient was also instructed that they will need to review over the PAT instructions again at home before surgery.

## 2020-02-22 DIAGNOSIS — L209 Atopic dermatitis, unspecified: Secondary | ICD-10-CM | POA: Diagnosis not present

## 2020-02-24 ENCOUNTER — Other Ambulatory Visit (HOSPITAL_COMMUNITY)
Admission: RE | Admit: 2020-02-24 | Discharge: 2020-02-24 | Disposition: A | Discharge status: Home / Self Care | Payer: Medicare Other | Source: Ambulatory Visit | Attending: Orthopedic Surgery | Admitting: Orthopedic Surgery

## 2020-02-24 DIAGNOSIS — Z01812 Encounter for preprocedural laboratory examination: Secondary | ICD-10-CM | POA: Insufficient documentation

## 2020-02-24 DIAGNOSIS — Z20822 Contact with and (suspected) exposure to covid-19: Secondary | ICD-10-CM | POA: Diagnosis not present

## 2020-02-24 LAB — SARS CORONAVIRUS 2 (TAT 6-24 HRS): SARS Coronavirus 2: NEGATIVE

## 2020-02-27 ENCOUNTER — Encounter (HOSPITAL_COMMUNITY): Payer: Self-pay | Admitting: Orthopedic Surgery

## 2020-02-27 NOTE — Anesthesia Preprocedure Evaluation (Addendum)
Anesthesia Evaluation  Patient identified by MRN, date of birth, ID band Patient awake    Reviewed: Allergy & Precautions, NPO status , Patient's Chart, lab work & pertinent test results, reviewed documented beta blocker date and time   History of Anesthesia Complications Negative for: history of anesthetic complications  Airway Mallampati: II  TM Distance: >3 FB Neck ROM: Full    Dental  (+) Edentulous Upper, Missing, Poor Dentition,    Pulmonary COPD, former smoker,    Pulmonary exam normal        Cardiovascular hypertension, Pt. on medications and Pt. on home beta blockers + Peripheral Vascular Disease  Normal cardiovascular exam     Neuro/Psych  Headaches, Anxiety Depression    GI/Hepatic Neg liver ROS, GERD  Medicated and Controlled,  Endo/Other  negative endocrine ROS  Renal/GU negative Renal ROS  negative genitourinary   Musculoskeletal  (+) Arthritis ,   Abdominal   Peds  Hematology negative hematology ROS (+)   Anesthesia Other Findings Day of surgery medications reviewed with patient.  Reproductive/Obstetrics negative OB ROS                            Anesthesia Physical Anesthesia Plan  ASA: II  Anesthesia Plan: Spinal   Post-op Pain Management:    Induction:   PONV Risk Score and Plan: 3 and Treatment may vary due to age or medical condition, Ondansetron, Dexamethasone and Propofol infusion  Airway Management Planned: Natural Airway and Simple Face Mask  Additional Equipment: None  Intra-op Plan:   Post-operative Plan:   Informed Consent: I have reviewed the patients History and Physical, chart, labs and discussed the procedure including the risks, benefits and alternatives for the proposed anesthesia with the patient or authorized representative who has indicated his/her understanding and acceptance.       Plan Discussed with: CRNA  Anesthesia Plan  Comments:        Anesthesia Quick Evaluation

## 2020-02-28 ENCOUNTER — Inpatient Hospital Stay (HOSPITAL_COMMUNITY): Payer: Medicare Other

## 2020-02-28 ENCOUNTER — Observation Stay (HOSPITAL_COMMUNITY)
Admission: RE | Admit: 2020-02-28 | Discharge: 2020-02-29 | Disposition: A | Discharge status: Home / Self Care | Payer: Medicare Other | Attending: Orthopedic Surgery | Admitting: Orthopedic Surgery

## 2020-02-28 ENCOUNTER — Other Ambulatory Visit: Payer: Self-pay

## 2020-02-28 ENCOUNTER — Inpatient Hospital Stay (HOSPITAL_COMMUNITY): Payer: Medicare Other | Admitting: Anesthesiology

## 2020-02-28 ENCOUNTER — Encounter (HOSPITAL_COMMUNITY)
Admission: RE | Disposition: A | Discharge status: Home / Self Care | Payer: Self-pay | Source: Home / Self Care | Attending: Orthopedic Surgery

## 2020-02-28 ENCOUNTER — Inpatient Hospital Stay (HOSPITAL_COMMUNITY): Payer: Medicare Other | Admitting: Physician Assistant

## 2020-02-28 ENCOUNTER — Observation Stay (HOSPITAL_COMMUNITY): Payer: Medicare Other

## 2020-02-28 ENCOUNTER — Encounter (HOSPITAL_COMMUNITY): Payer: Self-pay | Admitting: Orthopedic Surgery

## 2020-02-28 DIAGNOSIS — Z87891 Personal history of nicotine dependence: Secondary | ICD-10-CM | POA: Insufficient documentation

## 2020-02-28 DIAGNOSIS — M1611 Unilateral primary osteoarthritis, right hip: Principal | ICD-10-CM | POA: Diagnosis present

## 2020-02-28 DIAGNOSIS — F329 Major depressive disorder, single episode, unspecified: Secondary | ICD-10-CM | POA: Insufficient documentation

## 2020-02-28 DIAGNOSIS — I10 Essential (primary) hypertension: Secondary | ICD-10-CM | POA: Insufficient documentation

## 2020-02-28 DIAGNOSIS — Z79899 Other long term (current) drug therapy: Secondary | ICD-10-CM | POA: Insufficient documentation

## 2020-02-28 DIAGNOSIS — Z6821 Body mass index (BMI) 21.0-21.9, adult: Secondary | ICD-10-CM | POA: Insufficient documentation

## 2020-02-28 DIAGNOSIS — Z471 Aftercare following joint replacement surgery: Secondary | ICD-10-CM | POA: Diagnosis not present

## 2020-02-28 DIAGNOSIS — J449 Chronic obstructive pulmonary disease, unspecified: Secondary | ICD-10-CM | POA: Insufficient documentation

## 2020-02-28 DIAGNOSIS — F419 Anxiety disorder, unspecified: Secondary | ICD-10-CM | POA: Diagnosis not present

## 2020-02-28 DIAGNOSIS — Z7982 Long term (current) use of aspirin: Secondary | ICD-10-CM | POA: Diagnosis not present

## 2020-02-28 DIAGNOSIS — I739 Peripheral vascular disease, unspecified: Secondary | ICD-10-CM | POA: Diagnosis not present

## 2020-02-28 DIAGNOSIS — Z419 Encounter for procedure for purposes other than remedying health state, unspecified: Secondary | ICD-10-CM

## 2020-02-28 DIAGNOSIS — K219 Gastro-esophageal reflux disease without esophagitis: Secondary | ICD-10-CM | POA: Insufficient documentation

## 2020-02-28 DIAGNOSIS — K589 Irritable bowel syndrome without diarrhea: Secondary | ICD-10-CM | POA: Diagnosis not present

## 2020-02-28 DIAGNOSIS — E785 Hyperlipidemia, unspecified: Secondary | ICD-10-CM | POA: Insufficient documentation

## 2020-02-28 DIAGNOSIS — F418 Other specified anxiety disorders: Secondary | ICD-10-CM | POA: Diagnosis not present

## 2020-02-28 DIAGNOSIS — E669 Obesity, unspecified: Secondary | ICD-10-CM | POA: Diagnosis not present

## 2020-02-28 DIAGNOSIS — Z96649 Presence of unspecified artificial hip joint: Secondary | ICD-10-CM

## 2020-02-28 DIAGNOSIS — Z96641 Presence of right artificial hip joint: Secondary | ICD-10-CM | POA: Diagnosis not present

## 2020-02-28 DIAGNOSIS — M169 Osteoarthritis of hip, unspecified: Secondary | ICD-10-CM

## 2020-02-28 HISTORY — PX: TOTAL HIP ARTHROPLASTY: SHX124

## 2020-02-28 LAB — TYPE AND SCREEN
ABO/RH(D): O POS
Antibody Screen: NEGATIVE

## 2020-02-28 SURGERY — ARTHROPLASTY, HIP, TOTAL, ANTERIOR APPROACH
Anesthesia: Spinal | Site: Hip | Laterality: Right

## 2020-02-28 MED ORDER — POLYSACCHARIDE IRON COMPLEX 150 MG PO CAPS
150.0000 mg | ORAL_CAPSULE | Freq: Two times a day (BID) | ORAL | Status: DC
  Administered 2020-02-28 – 2020-02-29 (×2): 150 mg via ORAL
  Filled 2020-02-28 (×2): qty 1

## 2020-02-28 MED ORDER — OXYCODONE HCL 5 MG PO TABS: 5.0000 mg | ORAL_TABLET | Freq: Once | ORAL | Status: DC | PRN

## 2020-02-28 MED ORDER — FENTANYL CITRATE (PF) 100 MCG/2ML IJ SOLN
INTRAMUSCULAR | Status: AC
  Filled 2020-02-28: qty 2

## 2020-02-28 MED ORDER — METHOCARBAMOL 500 MG IVPB - SIMPLE MED
INTRAVENOUS | Status: AC
  Filled 2020-02-28: qty 50

## 2020-02-28 MED ORDER — PROPOFOL 500 MG/50ML IV EMUL
INTRAVENOUS | Status: DC | PRN
  Administered 2020-02-28: 25 ug/kg/min via INTRAVENOUS

## 2020-02-28 MED ORDER — PHENOL 1.4 % MT LIQD: 1.0000 | OROMUCOSAL | Status: DC | PRN

## 2020-02-28 MED ORDER — DILTIAZEM HCL ER COATED BEADS 240 MG PO CP24
240.0000 mg | ORAL_CAPSULE | Freq: Every day | ORAL | Status: DC
  Administered 2020-02-29: 11:00:00 240 mg via ORAL
  Filled 2020-02-28 (×2): qty 1

## 2020-02-28 MED ORDER — BISACODYL 10 MG RE SUPP: 10.0000 mg | Freq: Every day | RECTAL | Status: DC | PRN

## 2020-02-28 MED ORDER — SODIUM CHLORIDE 0.9 % IV SOLN: INTRAVENOUS | Status: DC

## 2020-02-28 MED ORDER — ASPIRIN EC 325 MG PO TBEC
325.0000 mg | DELAYED_RELEASE_TABLET | Freq: Two times a day (BID) | ORAL | Status: DC
  Administered 2020-02-29: 11:00:00 325 mg via ORAL
  Filled 2020-02-28: qty 1

## 2020-02-28 MED ORDER — METHOCARBAMOL 500 MG PO TABS
500.0000 mg | ORAL_TABLET | Freq: Four times a day (QID) | ORAL | Status: DC | PRN
  Administered 2020-02-28: 20:00:00 500 mg via ORAL
  Filled 2020-02-28: qty 1

## 2020-02-28 MED ORDER — TRANEXAMIC ACID-NACL 1000-0.7 MG/100ML-% IV SOLN
1000.0000 mg | INTRAVENOUS | Status: AC
  Administered 2020-02-28: 11:00:00 1000 mg via INTRAVENOUS
  Filled 2020-02-28: qty 100

## 2020-02-28 MED ORDER — LORATADINE 10 MG PO TABS
10.0000 mg | ORAL_TABLET | Freq: Every day | ORAL | Status: DC
  Administered 2020-02-29: 10 mg via ORAL
  Filled 2020-02-28: qty 1

## 2020-02-28 MED ORDER — METOCLOPRAMIDE HCL 5 MG/ML IJ SOLN: 5.0000 mg | Freq: Three times a day (TID) | INTRAMUSCULAR | Status: DC | PRN

## 2020-02-28 MED ORDER — METOCLOPRAMIDE HCL 5 MG PO TABS: 5.0000 mg | ORAL_TABLET | Freq: Three times a day (TID) | ORAL | Status: DC | PRN

## 2020-02-28 MED ORDER — 0.9 % SODIUM CHLORIDE (POUR BTL) OPTIME
TOPICAL | Status: DC | PRN
  Administered 2020-02-28: 1000 mL

## 2020-02-28 MED ORDER — MAGNESIUM CITRATE PO SOLN: 1.0000 | Freq: Once | ORAL | Status: DC | PRN

## 2020-02-28 MED ORDER — ONDANSETRON HCL 4 MG/2ML IJ SOLN
INTRAMUSCULAR | Status: DC | PRN
  Administered 2020-02-28: 4 mg via INTRAVENOUS

## 2020-02-28 MED ORDER — PROPOFOL 1000 MG/100ML IV EMUL
INTRAVENOUS | Status: AC
  Filled 2020-02-28: qty 100

## 2020-02-28 MED ORDER — CEFAZOLIN SODIUM-DEXTROSE 2-4 GM/100ML-% IV SOLN
2.0000 g | INTRAVENOUS | Status: AC
  Administered 2020-02-28: 11:00:00 2 g via INTRAVENOUS
  Filled 2020-02-28: qty 100

## 2020-02-28 MED ORDER — ACETAMINOPHEN 500 MG PO TABS
500.0000 mg | ORAL_TABLET | Freq: Four times a day (QID) | ORAL | Status: AC
  Administered 2020-02-28: 500 mg via ORAL
  Filled 2020-02-28 (×2): qty 1

## 2020-02-28 MED ORDER — LIDOCAINE 2% (20 MG/ML) 5 ML SYRINGE
INTRAMUSCULAR | Status: DC | PRN
  Administered 2020-02-28: 40 mg via INTRAVENOUS

## 2020-02-28 MED ORDER — ONDANSETRON HCL 4 MG/2ML IJ SOLN: 4.0000 mg | Freq: Four times a day (QID) | INTRAMUSCULAR | Status: DC | PRN

## 2020-02-28 MED ORDER — HYDROCODONE-ACETAMINOPHEN 5-325 MG PO TABS
1.0000 | ORAL_TABLET | ORAL | Status: DC | PRN
  Administered 2020-02-28 (×2): 1 via ORAL
  Administered 2020-02-28 – 2020-02-29 (×3): 2 via ORAL
  Filled 2020-02-28: qty 1
  Filled 2020-02-28 (×5): qty 2

## 2020-02-28 MED ORDER — ACETAMINOPHEN 500 MG PO TABS: 1000.0000 mg | ORAL_TABLET | Freq: Once | ORAL | Status: DC

## 2020-02-28 MED ORDER — DEXAMETHASONE SODIUM PHOSPHATE 10 MG/ML IJ SOLN
8.0000 mg | Freq: Once | INTRAMUSCULAR | Status: AC
  Administered 2020-02-28: 11:00:00 6 mg via INTRAVENOUS

## 2020-02-28 MED ORDER — WATER FOR IRRIGATION, STERILE IR SOLN
Status: DC | PRN
  Administered 2020-02-28: 2000 mL

## 2020-02-28 MED ORDER — BUPIVACAINE HCL 0.25 % IJ SOLN
INTRAMUSCULAR | Status: DC | PRN
  Administered 2020-02-28: 30 mL

## 2020-02-28 MED ORDER — DICYCLOMINE HCL 20 MG PO TABS
20.0000 mg | ORAL_TABLET | Freq: Three times a day (TID) | ORAL | Status: DC
  Administered 2020-02-28 – 2020-02-29 (×4): 20 mg via ORAL
  Filled 2020-02-28 (×6): qty 1

## 2020-02-28 MED ORDER — FENTANYL CITRATE (PF) 100 MCG/2ML IJ SOLN
INTRAMUSCULAR | Status: DC | PRN
  Administered 2020-02-28: 50 ug via INTRAVENOUS

## 2020-02-28 MED ORDER — PROPOFOL 10 MG/ML IV BOLUS
INTRAVENOUS | Status: DC | PRN
  Administered 2020-02-28 (×2): 10 mg via INTRAVENOUS

## 2020-02-28 MED ORDER — FUROSEMIDE 20 MG PO TABS
20.0000 mg | ORAL_TABLET | Freq: Every day | ORAL | Status: DC
  Administered 2020-02-29: 20 mg via ORAL
  Filled 2020-02-28: qty 1

## 2020-02-28 MED ORDER — BUPIVACAINE IN DEXTROSE 0.75-8.25 % IT SOLN
INTRATHECAL | Status: DC | PRN
  Administered 2020-02-28: 1.6 mL via INTRATHECAL

## 2020-02-28 MED ORDER — PHENYLEPHRINE HCL-NACL 10-0.9 MG/250ML-% IV SOLN
INTRAVENOUS | Status: DC | PRN
  Administered 2020-02-28: 40 ug/min via INTRAVENOUS

## 2020-02-28 MED ORDER — CHLORHEXIDINE GLUCONATE 4 % EX LIQD: 60.0000 mL | Freq: Once | CUTANEOUS | Status: DC

## 2020-02-28 MED ORDER — PROMETHAZINE HCL 25 MG/ML IJ SOLN: 6.2500 mg | INTRAMUSCULAR | Status: DC | PRN

## 2020-02-28 MED ORDER — TRAMADOL HCL 50 MG PO TABS: 100.0000 mg | ORAL_TABLET | Freq: Four times a day (QID) | ORAL | Status: DC | PRN

## 2020-02-28 MED ORDER — ACETAMINOPHEN 10 MG/ML IV SOLN: 1000.0000 mg | Freq: Four times a day (QID) | INTRAVENOUS | Status: DC

## 2020-02-28 MED ORDER — MENTHOL 3 MG MT LOZG: 1.0000 | LOZENGE | OROMUCOSAL | Status: DC | PRN

## 2020-02-28 MED ORDER — HYDROCODONE-ACETAMINOPHEN 7.5-325 MG PO TABS
1.0000 | ORAL_TABLET | ORAL | Status: DC | PRN
  Administered 2020-02-28: 1 via ORAL
  Filled 2020-02-28: qty 1

## 2020-02-28 MED ORDER — ATENOLOL 50 MG PO TABS
50.0000 mg | ORAL_TABLET | Freq: Every day | ORAL | Status: DC
  Administered 2020-02-29: 50 mg via ORAL
  Filled 2020-02-28: qty 1

## 2020-02-28 MED ORDER — ONDANSETRON HCL 4 MG/2ML IJ SOLN
INTRAMUSCULAR | Status: AC
  Filled 2020-02-28: qty 2

## 2020-02-28 MED ORDER — MORPHINE SULFATE (PF) 2 MG/ML IV SOLN
0.5000 mg | INTRAVENOUS | Status: DC | PRN
  Administered 2020-02-28: 1 mg via INTRAVENOUS
  Filled 2020-02-28: qty 1

## 2020-02-28 MED ORDER — METHOCARBAMOL 500 MG IVPB - SIMPLE MED
500.0000 mg | Freq: Four times a day (QID) | INTRAVENOUS | Status: DC | PRN
  Administered 2020-02-28: 13:00:00 500 mg via INTRAVENOUS
  Filled 2020-02-28: qty 50

## 2020-02-28 MED ORDER — ONDANSETRON HCL 4 MG PO TABS: 4.0000 mg | ORAL_TABLET | Freq: Four times a day (QID) | ORAL | Status: DC | PRN

## 2020-02-28 MED ORDER — PANTOPRAZOLE SODIUM 40 MG PO TBEC
40.0000 mg | DELAYED_RELEASE_TABLET | Freq: Every day | ORAL | Status: DC
  Administered 2020-02-29: 11:00:00 40 mg via ORAL
  Filled 2020-02-28: qty 1

## 2020-02-28 MED ORDER — OXYCODONE HCL 5 MG/5ML PO SOLN: 5.0000 mg | Freq: Once | ORAL | Status: DC | PRN

## 2020-02-28 MED ORDER — DEXAMETHASONE SODIUM PHOSPHATE 10 MG/ML IJ SOLN
10.0000 mg | Freq: Once | INTRAMUSCULAR | Status: AC
  Administered 2020-02-29: 11:00:00 10 mg via INTRAVENOUS
  Filled 2020-02-28: qty 1

## 2020-02-28 MED ORDER — LACTATED RINGERS IV SOLN: INTRAVENOUS | Status: DC

## 2020-02-28 MED ORDER — BUPIVACAINE HCL (PF) 0.25 % IJ SOLN
INTRAMUSCULAR | Status: AC
  Filled 2020-02-28: qty 30

## 2020-02-28 MED ORDER — DEXAMETHASONE SODIUM PHOSPHATE 10 MG/ML IJ SOLN
INTRAMUSCULAR | Status: AC
  Filled 2020-02-28: qty 1

## 2020-02-28 MED ORDER — POLYETHYLENE GLYCOL 3350 17 G PO PACK: 17.0000 g | PACK | Freq: Every day | ORAL | Status: DC | PRN

## 2020-02-28 MED ORDER — DOCUSATE SODIUM 100 MG PO CAPS
100.0000 mg | ORAL_CAPSULE | Freq: Two times a day (BID) | ORAL | Status: DC
  Administered 2020-02-28 – 2020-02-29 (×2): 100 mg via ORAL
  Filled 2020-02-28 (×2): qty 1

## 2020-02-28 MED ORDER — FENTANYL CITRATE (PF) 100 MCG/2ML IJ SOLN
25.0000 ug | INTRAMUSCULAR | Status: DC | PRN
  Administered 2020-02-28: 50 ug via INTRAVENOUS

## 2020-02-28 MED ORDER — MIRTAZAPINE 15 MG PO TABS
30.0000 mg | ORAL_TABLET | Freq: Every day | ORAL | Status: DC
  Administered 2020-02-28: 22:00:00 30 mg via ORAL
  Filled 2020-02-28: qty 2

## 2020-02-28 MED ORDER — ATORVASTATIN CALCIUM 40 MG PO TABS
40.0000 mg | ORAL_TABLET | Freq: Every day | ORAL | Status: DC
  Administered 2020-02-29: 11:00:00 40 mg via ORAL
  Filled 2020-02-28: qty 1

## 2020-02-28 MED ORDER — POVIDONE-IODINE 10 % EX SWAB
2.0000 "application " | Freq: Once | CUTANEOUS | Status: AC
  Administered 2020-02-28: 2 via TOPICAL

## 2020-02-28 SURGICAL SUPPLY — 48 items
BAG DECANTER FOR FLEXI CONT (MISCELLANEOUS) IMPLANT
BAG ZIPLOCK 12X15 (MISCELLANEOUS) ×3 IMPLANT
BALL HIP CERAMIC (Hips) ×1 IMPLANT
BLADE SAG 18X100X1.27 (BLADE) ×3 IMPLANT
CLOSURE WOUND 1/2 X4 (GAUZE/BANDAGES/DRESSINGS) ×2
COVER PERINEAL POST (MISCELLANEOUS) ×3 IMPLANT
COVER SURGICAL LIGHT HANDLE (MISCELLANEOUS) ×3 IMPLANT
COVER WAND RF STERILE (DRAPES) IMPLANT
CUP ACET PINNACLE SECTR 50MM (Hips) ×1 IMPLANT
DECANTER SPIKE VIAL GLASS SM (MISCELLANEOUS) ×3 IMPLANT
DRAPE STERI IOBAN 125X83 (DRAPES) ×3 IMPLANT
DRAPE U-SHAPE 47X51 STRL (DRAPES) ×6 IMPLANT
DRSG ADAPTIC 3X8 NADH LF (GAUZE/BANDAGES/DRESSINGS) ×3 IMPLANT
DRSG AQUACEL AG ADV 3.5X10 (GAUZE/BANDAGES/DRESSINGS) ×3 IMPLANT
DURAPREP 26ML APPLICATOR (WOUND CARE) ×3 IMPLANT
ELECT REM PT RETURN 15FT ADLT (MISCELLANEOUS) ×3 IMPLANT
EVACUATOR 1/8 PVC DRAIN (DRAIN) IMPLANT
GLOVE BIO SURGEON STRL SZ 6 (GLOVE) ×3 IMPLANT
GLOVE BIO SURGEON STRL SZ7 (GLOVE) IMPLANT
GLOVE BIO SURGEON STRL SZ8 (GLOVE) ×3 IMPLANT
GLOVE BIOGEL PI IND STRL 6.5 (GLOVE) ×1 IMPLANT
GLOVE BIOGEL PI IND STRL 7.0 (GLOVE) IMPLANT
GLOVE BIOGEL PI IND STRL 8 (GLOVE) ×1 IMPLANT
GLOVE BIOGEL PI INDICATOR 6.5 (GLOVE) ×2
GLOVE BIOGEL PI INDICATOR 7.0 (GLOVE)
GLOVE BIOGEL PI INDICATOR 8 (GLOVE) ×2
GOWN STRL REUS W/TWL LRG LVL3 (GOWN DISPOSABLE) ×3 IMPLANT
GOWN STRL REUS W/TWL XL LVL3 (GOWN DISPOSABLE) IMPLANT
HIP BALL CERAMIC (Hips) ×3 IMPLANT
HOLDER FOLEY CATH W/STRAP (MISCELLANEOUS) ×3 IMPLANT
KIT TURNOVER KIT A (KITS) IMPLANT
LINER MARATHON 32 50 (Hips) ×3 IMPLANT
MANIFOLD NEPTUNE II (INSTRUMENTS) ×3 IMPLANT
PACK ANTERIOR HIP CUSTOM (KITS) ×3 IMPLANT
PENCIL SMOKE EVACUATOR COATED (MISCELLANEOUS) ×3 IMPLANT
PINNACLE SECTOR CUP 50MM (Hips) ×3 IMPLANT
STEM FEMORAL SZ6 HIGH ACTIS (Stem) ×3 IMPLANT
STRIP CLOSURE SKIN 1/2X4 (GAUZE/BANDAGES/DRESSINGS) ×4 IMPLANT
SUT ETHIBOND NAB CT1 #1 30IN (SUTURE) ×3 IMPLANT
SUT MNCRL AB 4-0 PS2 18 (SUTURE) ×3 IMPLANT
SUT STRATAFIX 0 PDS 27 VIOLET (SUTURE) ×3
SUT VIC AB 2-0 CT1 27 (SUTURE) ×4
SUT VIC AB 2-0 CT1 TAPERPNT 27 (SUTURE) ×2 IMPLANT
SUTURE STRATFX 0 PDS 27 VIOLET (SUTURE) ×1 IMPLANT
SYR 50ML LL SCALE MARK (SYRINGE) IMPLANT
TRAY FOLEY MTR SLVR 14FR STAT (SET/KITS/TRAYS/PACK) ×3 IMPLANT
TRAY FOLEY MTR SLVR 16FR STAT (SET/KITS/TRAYS/PACK) IMPLANT
YANKAUER SUCT BULB TIP 10FT TU (MISCELLANEOUS) ×3 IMPLANT

## 2020-02-28 NOTE — Op Note (Signed)
OPERATIVE REPORT- TOTAL HIP ARTHROPLASTY   PREOPERATIVE DIAGNOSIS: Osteoarthritis of the Right hip.   POSTOPERATIVE DIAGNOSIS: Osteoarthritis of the Right  hip.   PROCEDURE: Right total hip arthroplasty, anterior approach.   SURGEON: Gaynelle Arabian, MD   ASSISTANT: Griffith Citron, PA-C  ANESTHESIA:  Spinal  ESTIMATED BLOOD LOSS:-100 mL    DRAINS: Hemovac x1.   COMPLICATIONS: None   CONDITION: PACU - hemodynamically stable.   BRIEF CLINICAL NOTE: Stacy Case is a 69 y.o. female who has advanced end-  stage arthritis of their Right  hip with progressively worsening pain and  dysfunction.The patient has failed nonoperative management and presents for  total hip arthroplasty.   PROCEDURE IN DETAIL: After successful administration of spinal  anesthetic, the traction boots for the [facility] bed were placed on both  feet and the patient was placed onto the [facility] bed, boots placed into the leg  holders. The Right hip was then isolated from the perineum with plastic  drapes and prepped and draped in the usual sterile fashion. ASIS and  greater trochanter were marked and a oblique incision was made, starting  at about 1 cm lateral and 2 cm distal to the ASIS and coursing towards  the anterior cortex of the femur. The skin was cut with a 10 blade  through subcutaneous tissue to the level of the fascia overlying the  tensor fascia lata muscle. The fascia was then incised in line with the  incision at the junction of the anterior third and posterior 2/3rd. The  muscle was teased off the fascia and then the interval between the TFL  and the rectus was developed. The Hohmann retractor was then placed at  the top of the femoral neck over the capsule. The vessels overlying the  capsule were cauterized and the fat on top of the capsule was removed.  A Hohmann retractor was then placed anterior underneath the rectus  femoris to give exposure to the entire anterior capsule. A T-shaped   capsulotomy was performed. The edges were tagged and the femoral head  was identified.       Osteophytes are removed off the superior acetabulum.  The femoral neck was then cut in situ with an oscillating saw. Traction  was then applied to the left lower extremity utilizing the [facility]  traction. The femoral head was then removed. Retractors were placed  around the acetabulum and then circumferential removal of the labrum was  performed. Osteophytes were also removed. Reaming starts at 47 mm to  medialize and  Increased in 2 mm increments to 49 mm. We reamed in  approximately 40 degrees of abduction, 20 degrees anteversion. A 50 mm  pinnacle acetabular shell was then impacted in anatomic position under  fluoroscopic guidance with excellent purchase. We did not need to place  any additional dome screws. A 32 mm neutral + 4 marathon liner was then  placed into the acetabular shell.       The femoral lift was then placed along the lateral aspect of the femur  just distal to the vastus ridge. The leg was  externally rotated and capsule  was stripped off the inferior aspect of the femoral neck down to the  level of the lesser trochanter, this was done with electrocautery. The femur was lifted after this was performed. The  leg was then placed in an extended and adducted position essentially delivering the femur. We also removed the capsule superiorly and the piriformis from the piriformis fossa  to gain excellent exposure of the  proximal femur. Rongeur was used to remove some cancellous bone to get  into the lateral portion of the proximal femur for placement of the  initial starter reamer. The starter broaches was placed  the starter broach  and was shown to go down the center of the canal. Broaching  with the Actis system was then performed starting at size 0  coursing  Up to size 6. A size 6 had excellent torsional and rotational  and axial stability. The trial high offset neck was then placed   with a 32 + 5 trial head. The hip was then reduced. We confirmed that  the stem was in the canal both on AP and lateral x-rays. It also has excellent sizing. The hip was reduced with outstanding stability through full extension and full external rotation.. AP pelvis was taken and the leg lengths were measured and found to be equal. Hip was then dislocated again and the femoral head and neck removed. The  femoral broach was removed. Size 6 Actis stem with a high offset  neck was then impacted into the femur following native anteversion. Has  excellent purchase in the canal. Excellent torsional and rotational and  axial stability. It is confirmed to be in the canal on AP and lateral  fluoroscopic views. The 32 +  5ceramic head was placed and the hip  reduced with outstanding stability. Again AP pelvis was taken and it  confirmed that the leg lengths were equal. The wound was then copiously  irrigated with saline solution and the capsule reattached and repaired  with Ethibond suture. 30 ml of .25% Bupivicaine was  injected into the capsule and into the edge of the tensor fascia lata as well as subcutaneous tissue. The fascia overlying the tensor fascia lata was then closed with a running #1 V-Loc. Subcu was closed with interrupted 2-0 Vicryl and subcuticular running 4-0 Monocryl. Incision was cleaned  and dried. Steri-Strips and a bulky sterile dressing applied. Hemovac  drain was hooked to suction and then the patient was awakened and transported to  recovery in stable condition.        Please note that a surgical assistant was a medical necessity for this procedure to perform it in a safe and expeditious manner. Assistant was necessary to provide appropriate retraction of vital neurovascular structures and to prevent femoral fracture and allow for anatomic placement of the prosthesis.  Gaynelle Arabian, M.D.

## 2020-02-28 NOTE — Interval H&P Note (Signed)
History and Physical Interval Note:  02/28/2020 9:38 AM  Stacy Case  has presented today for surgery, with the diagnosis of Right hip osteoarthritis.  The various methods of treatment have been discussed with the patient and family. After consideration of risks, benefits and other options for treatment, the patient has consented to  Procedure(s) with comments: [place] (Right) - 13min as a surgical intervention.  The patient's history has been reviewed, patient examined, no change in status, stable for surgery.  I have reviewed the patient's chart and labs.  Questions were answered to the patient's satisfaction.     Pilar Plate Tejay Hubert

## 2020-02-28 NOTE — Anesthesia Procedure Notes (Signed)
Spinal  Patient location during procedure: OR Start time: 02/28/2020 10:28 AM End time: 02/28/2020 10:34 AM Staffing Performed: anesthesiologist  Anesthesiologist: Brennan Bailey, MD Preanesthetic Checklist Completed: patient identified, IV checked, risks and benefits discussed, surgical consent, monitors and equipment checked, pre-op evaluation and timeout performed Spinal Block Patient position: sitting Prep: DuraPrep and site prepped and draped Patient monitoring: continuous pulse ox, blood pressure and heart rate Approach: midline Location: L3-4 Injection technique: single-shot Needle Needle type: Whitacre  Needle gauge: 22 G Needle length: 9 cm Additional Notes Risks, benefits, and alternative discussed. Patient gave consent to procedure. Prepped and draped in sitting position. Patient sedated but responsive to voice. Multiple attempts by CRNA at L2-3 midline and right paramedian with Penc[place] 24g. Several attempts by myself at L3-4 right paramedian with Whitacre 22g before clear CSF obtained. Positive terminal aspiration. No pain or paraesthesias with injection. Patient tolerated procedure well. Vital signs stable. Tawny Asal, MD

## 2020-02-28 NOTE — Transfer of Care (Signed)
Immediate Anesthesia Transfer of Care Note  Patient: Stacy Case  Procedure(s) Performed: Procedure(s) with comments: TOTAL HIP ARTHROPLASTY ANTERIOR APPROACH (Right) - 149min  Patient Location: PACU  Anesthesia Type:Spinal  Level of Consciousness:  sedated, patient cooperative and responds to stimulation  Airway & Oxygen Therapy:Patient Spontanous Breathing and Patient connected to face mask oxgen  Post-op Assessment:  Report given to PACU RN and Post -op Vital signs reviewed and stable  Post vital signs:  Reviewed and stable  Last Vitals:  Vitals:   02/28/20 0916 02/28/20 1205  BP: (!) 166/69   Pulse: 67 (!) (P) 57  Resp: 15   Temp: 36.8 C (!) (P) 36.4 C  SpO2: 95% (P) [id number]    Complications: No apparent anesthesia complications

## 2020-02-28 NOTE — Evaluation (Addendum)
Physical Therapy Evaluation Patient Details Name: Stacy Case MRN: [id number] DOB: 1951-01-19 Today's Date: 02/28/2020   History of Present Illness  Pt is a 69YO female s/o R THA anterior approach on 4/14 with PMH of COPD, HTN, PAD, and IBS.  Clinical Impression  Pt is s/p R THA ant approach POD0 resulting in the deficits listed below (see PT Problem List). Pt most limited with bed mobility secondary to pain. Pt with good abd strength, inability to perform SLR but quad set activation visible. Pt slightly impulsive with mobility, educated to decrease step length and speed to reduce risk or falls. Pt able to ambulate on room air without SOB or dizziness complaints and SpO2 >94%. Pt requests to not ascend/descend steps due to being too tired and plan to assess tomorrow. Pt able to release BUE from RW in standing to adjust personal pamper for prolapse comfort with good steadiness, weight shifted more to LLE. Repositioned pt supine in bed due to pain complaints, bed alarm on, daughter in room, ice on incision site and MD entering room.  Pt will benefit from skilled PT to increase their independence and safety with mobility to allow discharge to the venue listed below.      Follow Up Recommendations Follow surgeon's recommendation for DC plan and follow-up therapies    Equipment Recommendations  Rolling walker with 5" wheels;3in1 (PT)    Recommendations for Other Services       Precautions / Restrictions Precautions Precautions: Fall;Anterior Hip Restrictions Weight Bearing Restrictions: No      Mobility  Bed Mobility Overal bed mobility: Needs Assistance Bed Mobility: Supine to Sit;Sit to Supine     Supine to sit: Min assist;HOB elevated Sit to supine: Min assist;HOB elevated   General bed mobility comments: min assist for RLE management, uses bedrail to assist in uprighting trunk  Transfers Overall transfer level: Needs assistance Equipment used: Rolling walker (2  wheeled) Transfers: Sit to/from [facility] Sit to Stand: Min guard Stand pivot transfers: Min guard       General transfer comment: RLE extended with decreased weight-bearing, quick to rise using momentum  Ambulation/Gait Ambulation/Gait assistance: Min guard Gait Distance (Feet): 60 Feet Assistive device: Rolling walker (2 wheeled) Gait Pattern/deviations: Step-to pattern;Decreased weight shift to right;Antalgic;Narrow base of support Gait velocity: decreased   General Gait Details: slow step-to pattern, increased weight-bearing on RW with L step progression to limit weight on RLE, limited R knee hip/knee flexion in swing due to "pulling" sensation  Stairs            Wheelchair Mobility    Modified Rankin (Stroke Patients Only)       Balance Overall balance assessment: Needs assistance Sitting-balance support: Feet supported;No upper extremity supported Sitting balance-Leahy Scale: Good Sitting balance - Comments: seated EOB, kyphotic posture in sitting   Standing balance support: During functional activity;Bilateral upper extremity supported Standing balance-Leahy Scale: Fair Standing balance comment: static standing able to release BUE from RW, fair with RW            Pertinent Vitals/Pain Pain Assessment: 0-10 Pain Score: 7  Pain Location: R hip Pain Descriptors / Indicators: Dull;Aching Pain Intervention(s): Limited activity within patient's tolerance;Monitored during session;Premedicated before session;Repositioned;Ice applied    Home Living Family/patient expects to be discharged to:: Private residence Living Arrangements: Children;Other relatives Available Help at Discharge: Family;Available 24 hours/day Type of Home: House Home Access: Stairs to enter Entrance Stairs-Rails: None Entrance Stairs-Number of Steps: 2 Home Layout: Two level;Able to live on  main level with bedroom/bathroom Home Equipment: Walker - 4 wheels(rollator)       Prior Function Level of Independence: Needs assistance   Gait / Transfers Assistance Needed: Pt reports walking community distances with rollator and uses rollator inside the home  ADL's / Homemaking Assistance Needed: Daughter assists with initiating dressing; able to use reachers to dress independently  Comments: Pt reports no falls since 2018     Hand Dominance        Extremity/Trunk Assessment   Upper Extremity Assessment Upper Extremity Assessment: Overall WFL for tasks assessed;LUE deficits/detail LUE Deficits / Details: (BLE AROM WNL, strength grossly 3+/5)    Lower Extremity Assessment Lower Extremity Assessment: RLE deficits/detail RLE Deficits / Details: ankle AROM WNL, knee flexion limited secondary to "pulling" sensation at hip    Cervical / Trunk Assessment Cervical / Trunk Assessment: Kyphotic  Communication   Communication: No difficulties  Cognition Arousal/Alertness: Awake/alert Behavior During Therapy: WFL for tasks assessed/performed Overall Cognitive Status: Within Functional Limits for tasks assessed             General Comments      Exercises     Assessment/Plan    PT Assessment Patient needs continued PT services  PT Problem List Decreased strength;Decreased range of motion;Decreased activity tolerance;Decreased balance;Decreased mobility;Decreased knowledge of use of DME;Decreased knowledge of precautions;Pain       PT Treatment Interventions DME instruction;Gait training;Stair training;Functional mobility training;Therapeutic activities;Therapeutic exercise;Balance training;Neuromuscular re-education;Patient/family education;Modalities    PT Goals (Current goals can be found in the Care Plan section)  Acute Rehab PT Goals Patient Stated Goal: go shopping again PT Goal Formulation: With patient/family Time For Goal Achievement: 03/06/20 Potential to Achieve Goals: Good    Frequency Min 3X/week   Barriers to discharge         Co-evaluation               AM-PAC PT "6 Clicks" Mobility  Outcome Measure Help needed turning from your back to your side while in a flat bed without using bedrails?: A Little Help needed moving from lying on your back to sitting on the side of a flat bed without using bedrails?: A Little Help needed moving to and from a bed to a chair (including a wheelchair)?: A Little Help needed standing up from a chair using your arms (e.g., wheelchair or bedside chair)?: A Little Help needed to walk in hospital room?: A Little Help needed climbing 3-5 steps with a railing? : A Lot 6 Click Score: 17    End of Session Equipment Utilized During Treatment: Gait belt Activity Tolerance: Patient tolerated treatment well;Patient limited by pain Patient left: in bed;with call bell/phone within reach;with bed alarm set;with nursing/sitter in room;with family/visitor present Nurse Communication: Mobility status PT Visit Diagnosis: Unsteadiness on feet (R26.81);Other abnormalities of gait and mobility (R26.89);Muscle weakness (generalized) (M62.81);Pain Pain - Right/Left: Right Pain - part of body: Hip    Time: [id number] PT Time Calculation (min) (ACUTE ONLY): 50 min   Charges:   PT Evaluation $PT Eval Low Complexity: 1 Low PT Treatments $Gait Training: 8-22 mins        Talbot Grumbling PT, DPT 02/28/20, 4:36 PM [phone]

## 2020-02-28 NOTE — Anesthesia Postprocedure Evaluation (Signed)
Anesthesia Post Note  Patient: Juventino Slovak  Procedure(s) Performed: TOTAL HIP ARTHROPLASTY ANTERIOR APPROACH (Right Hip)     Patient location during evaluation: PACU Anesthesia Type: Spinal Level of consciousness: awake and alert and oriented Pain management: pain level controlled Vital Signs Assessment: post-procedure vital signs reviewed and stable Respiratory status: spontaneous breathing, nonlabored ventilation and respiratory function stable Cardiovascular status: blood pressure returned to baseline Postop Assessment: no apparent nausea or vomiting, spinal receding, no headache and no backache Anesthetic complications: no    Last Vitals:  Vitals:   02/28/20 1300 02/28/20 1328  BP: (!) 143/82 133/81  Pulse: (!) 56 60  Resp: 10 16  Temp:  36.6 C  SpO2: 100% 99%    Last Pain:  Vitals:   02/28/20 1328  TempSrc: Oral  PainSc:                  Brennan Bailey

## 2020-02-28 NOTE — Discharge Instructions (Addendum)
Stacy Aluisio, MD Total Joint Specialist [facility] [place] [address] [place], [place] [address] [phone]  ANTERIOR APPROACH TOTAL HIP REPLACEMENT POSTOPERATIVE DIRECTIONS     Hip Rehabilitation, Guidelines Following Surgery  The results of a hip operation are greatly improved after range of motion and muscle strengthening exercises. Follow all safety measures which are given to protect your hip. If any of these exercises cause increased pain or swelling in your joint, decrease the amount until you are comfortable again. Then slowly increase the exercises. Call your caregiver if you have problems or questions.   BLOOD CLOT PREVENTION . Take a 325 mg Aspirin two times a day for three weeks following surgery. Then resume one 81 mg Aspirin once a day. . You may resume your vitamins/supplements upon discharge from the hospital. . Do not take any NSAIDs (Advil, Aleve, Ibuprofen, Meloxicam, etc.) until you have discontinued the 325 mg Aspirin.  HOME CARE INSTRUCTIONS  . Remove items at home which could result in a fall. This includes throw rugs or furniture in walking pathways.   ICE to the affected hip as frequently as 20-30 minutes an hour and then as needed for pain and swelling. Continue to use ice on the hip for pain and swelling from surgery. You may notice swelling that will progress down to the foot and ankle. This is normal after surgery. Elevate the leg when you are not up walking on it.    Continue to use the breathing machine which will help keep your temperature down.  It is common for your temperature to cycle up and down following surgery, especially at night when you are not up moving around and exerting yourself.  The breathing machine keeps your lungs expanded and your temperature down.  DIET You may resume your previous home diet once your are discharged from the hospital.  DRESSING / WOUND CARE / SHOWERING . You have an adhesive waterproof  bandage over the incision. Leave this in place until your first follow-up appointment. Once you remove this you will not need to place another bandage.  . You may begin showering 3 days following surgery, but do not submerge the incision under water.  ACTIVITY . For the first 3-5 days, it is important to rest and keep the operative leg elevated. You should, as a general rule, rest for 50 minutes and walk/stretch for 10 minutes per hour. After 5 days, you may slowly increase activity as tolerated.  . Perform the exercises you were provided twice a day for about 15-20 minutes each session. Begin these 2 days following surgery. . Walk with your walker as instructed. Use the walker until you are comfortable transitioning to a cane. Walk with the cane in the opposite hand of the operative leg. You may discontinue the cane once you are comfortable and walking steadily. . Avoid periods of inactivity such as sitting longer than an hour when not asleep. This helps prevent blood clots.  . Do not drive a car for 6 weeks or until released by your surgeon.  . Do not drive while taking narcotics.  TED HOSE STOCKINGS Wear the elastic stockings on both legs for three weeks following surgery during the day. You may remove them at night while sleeping.  WEIGHT BEARING Weight bearing as tolerated with assist device (walker, cane, etc) as directed, use it as long as suggested by your surgeon or therapist, typically at least 4-6 weeks.  POSTOPERATIVE CONSTIPATION PROTOCOL Constipation - defined medically as fewer than   three stools per week and severe constipation as less than one stool per week.  One of the most common issues patients have following surgery is constipation.  Even if you have a regular bowel pattern at home, your normal regimen is likely to be disrupted due to multiple reasons following surgery.  Combination of anesthesia, postoperative narcotics, change in appetite and fluid intake all can affect  your bowels.  In order to avoid complications following surgery, here are some recommendations in order to help you during your recovery period.  . Colace (docusate) - Pick up an over-the-counter form of Colace or another stool softener and take twice a day as long as you are requiring postoperative pain medications.  Take with a full glass of water daily.  If you experience loose stools or diarrhea, hold the colace until you stool forms back up.  If your symptoms do not get better within 1 week or if they get worse, check with your doctor. . Dulcolax (bisacodyl) - Pick up over-the-counter and take as directed by the product packaging as needed to assist with the movement of your bowels.  Take with a full glass of water.  Use this product as needed if not relieved by Colace only.  . MiraLax (polyethylene glycol) - Pick up over-the-counter to have on hand.  MiraLax is a solution that will increase the amount of water in your bowels to assist with bowel movements.  Take as directed and can mix with a glass of water, juice, soda, coffee, or tea.  Take if you go more than two days without a movement.Do not use MiraLax more than once per day. Call your doctor if you are still constipated or irregular after using this medication for 7 days in a row.  If you continue to have problems with postoperative constipation, please contact the office for further assistance and recommendations.  If you experience "the worst abdominal pain ever" or develop nausea or vomiting, please contact the office immediatly for further recommendations for treatment.  ITCHING  If you experience itching with your medications, try taking only a single pain pill, or even half a pain pill at a time.  You can also use Benadryl over the counter for itching or also to help with sleep.   MEDICATIONS See your medication summary on the "After Visit Summary" that the nursing staff will review with you prior to discharge.  You may have some home  medications which will be placed on hold until you complete the course of blood thinner medication.  It is important for you to complete the blood thinner medication as prescribed by your surgeon.  Continue your approved medications as instructed at time of discharge.  PRECAUTIONS If you experience chest pain or shortness of breath - call 911 immediately for transfer to the hospital emergency department.  If you develop a fever greater that 101 F, purulent drainage from wound, increased redness or drainage from wound, foul odor from the wound/dressing, or calf pain - CONTACT YOUR SURGEON.                                                   FOLLOW-UP APPOINTMENTS Make sure you keep all of your appointments after your operation with your surgeon and caregivers. You should call the office at the above phone number and make an appointment for approximately   two weeks after the date of your surgery or on the date instructed by your surgeon outlined in the "After Visit Summary".  RANGE OF MOTION AND STRENGTHENING EXERCISES  These exercises are designed to help you keep full movement of your hip joint. Follow your caregiver's or physical therapist's instructions. Perform all exercises about fifteen times, three times per day or as directed. Exercise both hips, even if you have had only one joint replacement. These exercises can be done on a training (exercise) mat, on the floor, on a table or on a bed. Use whatever works the best and is most comfortable for you. Use music or television while you are exercising so that the exercises are a pleasant break in your day. This will make your life better with the exercises acting as a break in routine you can look forward to.  . Lying on your back, slowly slide your foot toward your buttocks, raising your knee up off the floor. Then slowly slide your foot back down until your leg is straight again.  . Lying on your back spread your legs as far apart as you can without  causing discomfort.  . Lying on your side, raise your upper leg and foot straight up from the floor as far as is comfortable. Slowly lower the leg and repeat.  . Lying on your back, tighten up the muscle in the front of your thigh (quadriceps muscles). You can do this by keeping your leg straight and trying to raise your heel off the floor. This helps strengthen the largest muscle supporting your knee.  . Lying on your back, tighten up the muscles of your buttocks both with the legs straight and with the knee bent at a comfortable angle while keeping your heel on the floor.   IF YOU ARE TRANSFERRED TO A SKILLED REHAB FACILITY If the patient is transferred to a skilled rehab facility following release from the hospital, a list of the current medications will be sent to the facility for the patient to continue.  When discharged from the skilled rehab facility, please have the facility set up the patient's Home Health Physical Therapy prior to being released. Also, the skilled facility will be responsible for providing the patient with their medications at time of release from the facility to include their pain medication, the muscle relaxants, and their blood thinner medication. If the patient is still at the rehab facility at time of the two week follow up appointment, the skilled rehab facility will also need to assist the patient in arranging follow up appointment in our office and any transportation needs.  MAKE SURE YOU:  . Understand these instructions.  . Get help right away if you are not doing well or get worse.    DENTAL ANTIBIOTICS:  In most cases prophylactic antibiotics for Dental procdeures after total joint surgery are not necessary.  Exceptions are as follows:  1. History of prior total joint infection  2. Severely immunocompromised (Organ Transplant, cancer chemotherapy, Rheumatoid biologic meds such as Humera)  3. Poorly controlled diabetes (A1C &gt; 8.0, blood glucose over  200)  If you have one of these conditions, contact your surgeon for an antibiotic prescription, prior to your dental procedure.    Pick up stool softner and laxative for home use following surgery while on pain medications. Do not submerge incision under water. Please use good hand washing techniques while changing dressing each day. May shower starting three days after surgery. Please use a clean towel to   pat the incision dry following showers. Continue to use ice for pain and swelling after surgery. Do not use any lotions or creams on the incision until instructed by your surgeon.  

## 2020-02-29 ENCOUNTER — Encounter: Payer: Self-pay | Admitting: *Deleted

## 2020-02-29 DIAGNOSIS — E785 Hyperlipidemia, unspecified: Secondary | ICD-10-CM | POA: Diagnosis not present

## 2020-02-29 DIAGNOSIS — F419 Anxiety disorder, unspecified: Secondary | ICD-10-CM | POA: Diagnosis not present

## 2020-02-29 DIAGNOSIS — F329 Major depressive disorder, single episode, unspecified: Secondary | ICD-10-CM | POA: Diagnosis not present

## 2020-02-29 DIAGNOSIS — K219 Gastro-esophageal reflux disease without esophagitis: Secondary | ICD-10-CM | POA: Diagnosis not present

## 2020-02-29 DIAGNOSIS — M1611 Unilateral primary osteoarthritis, right hip: Secondary | ICD-10-CM | POA: Diagnosis not present

## 2020-02-29 DIAGNOSIS — J449 Chronic obstructive pulmonary disease, unspecified: Secondary | ICD-10-CM | POA: Diagnosis not present

## 2020-02-29 LAB — BASIC METABOLIC PANEL
Anion gap: 10 (ref 5–15)
BUN: 24 mg/dL — ABNORMAL HIGH (ref 8–23)
CO2: 23 mmol/L (ref 22–32)
Calcium: 8.5 mg/dL — ABNORMAL LOW (ref 8.9–10.3)
Chloride: 105 mmol/L (ref 98–111)
Creatinine, Ser: 0.87 mg/dL (ref 0.44–1.00)
GFR calc Af Amer: >60 mL/min (ref >60)
GFR calc non Af Amer: >60 mL/min (ref >60)
Glucose, Bld: 176 mg/dL — ABNORMAL HIGH (ref 70–99)
Potassium: 4.9 mmol/L (ref 3.5–5.1)
Sodium: 138 mmol/L (ref 135–145)

## 2020-02-29 LAB — CBC
HCT: 30.9 % — ABNORMAL LOW (ref 36.0–46.0)
Hemoglobin: 8.8 g/dL — ABNORMAL LOW (ref 12.0–15.0)
MCH: 22.3 pg — ABNORMAL LOW (ref 26.0–34.0)
MCHC: 28.5 g/dL — ABNORMAL LOW (ref 30.0–36.0)
MCV: 78.4 fL — ABNORMAL LOW (ref 80.0–100.0)
Platelets: 256 10*3/uL (ref 150–400)
RBC: 3.94 MIL/uL (ref 3.87–5.11)
RDW: 16.8 % — ABNORMAL HIGH (ref 11.5–15.5)
WBC: 8.6 10*3/uL (ref 4.0–10.5)
nRBC: 0 % (ref 0.0–0.2)

## 2020-02-29 MED ORDER — METHOCARBAMOL 500 MG PO TABS: 500.0000 mg | ORAL_TABLET | Freq: Four times a day (QID) | ORAL | 0 refills | Status: DC | PRN

## 2020-02-29 MED ORDER — ASPIRIN 325 MG PO TBEC: 325.0000 mg | DELAYED_RELEASE_TABLET | Freq: Two times a day (BID) | ORAL | 0 refills | Status: AC

## 2020-02-29 MED ORDER — HYDROCODONE-ACETAMINOPHEN 5-325 MG PO TABS: 1.0000 | ORAL_TABLET | Freq: Four times a day (QID) | ORAL | 0 refills | Status: DC | PRN

## 2020-02-29 NOTE — Progress Notes (Signed)
   Subjective: 1 Day Post-Op Procedure(s) (LRB): TOTAL HIP ARTHROPLASTY ANTERIOR APPROACH (Right) Patient reports pain as mild.   Patient seen in rounds with Dr. Wynelle Link. Patient is well, and has had no acute complaints or problems other than discomfort in the right hip. No acute events overnight. Denies CP, SHOB, N/V.  We will continue therapy today.   Objective: Vital signs in last 24 hours: Temp:  [97.5 F (36.4 C)-98.7 F (37.1 C)] 98 F (36.7 C) (04/15 0454) Pulse Rate:  [53-72] 63 (04/15 0454) Resp:  [8-17] 14 (04/15 0454) BP: (116-166)/(59-98) 139/78 (04/15 0454) SpO2:  [95 %-100 %] 95 % (04/15 0454) Weight:  [54.1 kg] 54.1 kg (04/14 0916)  Intake/Output from previous day:  Intake/Output Summary (Last 24 hours) at 02/29/2020 0745 Last data filed at 02/29/2020 0700 Gross per 24 hour  Intake 3329.9 ml  Output 2450 ml  Net 879.9 ml     Intake/Output this shift: No intake/output data recorded.  Labs: Recent Labs    02/29/20 0323  HGB 8.8*   Recent Labs    02/29/20 0323  WBC 8.6  RBC 3.94  HCT 30.9*  PLT 256   Recent Labs    02/29/20 0323  NA 138  K 4.9  CL 105  CO2 23  BUN 24*  CREATININE 0.87  GLUCOSE 176*  CALCIUM 8.5*   No results for input(s): LABPT, INR in the last 72 hours.  Exam: General - Patient is Alert and Oriented Extremity - Neurologically intact Sensation intact distally Intact pulses distally Dorsiflexion/Plantar flexion intact Dressing - dressing C/D/I Motor Function - intact, moving foot and toes well on exam.   Past Medical History:  Diagnosis Date  . Anxiety   . Arthritis   . Cellulitis of ankle   . COPD (chronic obstructive pulmonary disease) ([facility])   . Cystocele   . Depression   . GERD (gastroesophageal reflux disease)   . Headache    hx of migraines  . Hyperlipidemia   . Hypertension   . IBS (irritable bowel syndrome)   . Peripheral arterial disease ([facility])    legs  . Pneumonia   . Right inguinal hernia      Assessment/Plan: 1 Day Post-Op Procedure(s) (LRB): TOTAL HIP ARTHROPLASTY ANTERIOR APPROACH (Right) Principal Problem:   OA (osteoarthritis) of hip Active Problems:   Primary osteoarthritis of right hip  Estimated body mass index is 21.81 kg/m as calculated from the following:   Height as of this encounter: 5\' 2"  (1.575 m).   Weight as of this encounter: 54.1 kg. Advance diet Up with therapy  DVT Prophylaxis - Aspirin Weight bearing as tolerated. D/C O2 and pulse ox and try on room air.  Hemoglobin of 8.8 this morning, down from 10.6. She is chronically anemic, and on iron at home.  Plan is to go Home after hospital stay. Plan for possible discharge today after 2 sessions of therapy if she is meeting her goals and is safe to discharge home. Otherwise, she will stay the night with plans for continued physical therapy and discharge tomorrow.   Griffith Citron, PA-C Orthopedic Surgery [phone] 02/29/2020, 7:45 AM

## 2020-02-29 NOTE — Progress Notes (Signed)
Physical Therapy Treatment Patient Details Name: Stacy Case MRN: [id number] DOB: 10-07-1951 Today's Date: 02/29/2020    History of Present Illness Pt is a 69YO female s/o R THA anterior approach on 4/14 with PMH of COPD, HTN, PAD, and IBS.    PT Comments    Pt is progressing with mobility, she ambulated 58' with RW, demonstrates understanding of HEP, and initiated stair training. She would benefit from 1 more PT session today to review stairs with caregiver, then expect she'll be ready to DC home from PT standpoint.    Follow Up Recommendations  Follow surgeon's recommendation for DC plan and follow-up therapies     Equipment Recommendations  Rolling walker with 5" wheels;3in1 (PT)    Recommendations for Other Services       Precautions / Restrictions Precautions Precautions: Fall Restrictions Weight Bearing Restrictions: No    Mobility  Bed Mobility Overal bed mobility: Needs Assistance Bed Mobility: Supine to Sit     Supine to sit: Min assist;HOB elevated     General bed mobility comments: min assist for RLE management and to raise trunk, uses bedrail to assist in uprighting trunk  Transfers Overall transfer level: Needs assistance Equipment used: Rolling walker (2 wheeled) Transfers: Sit to/from Stand Sit to Stand: Min guard         General transfer comment: VCs hand placement  Ambulation/Gait Ambulation/Gait assistance: Min guard Gait Distance (Feet): 110 Feet Assistive device: Rolling walker (2 wheeled) Gait Pattern/deviations: Step-to pattern;Decreased weight shift to right;Antalgic;Narrow base of support Gait velocity: decreased   General Gait Details: slow step-to pattern, increased weight-bearing on RW with L step progression to limit weight on RLE, limited R knee hip/knee flexion in swing due to "pulling" sensation   Stairs Stairs: Yes Stairs assistance: Min assist Stair Management: No rails;Backwards;With walker Number of Stairs:  2 General stair comments: min A to manage RW, VCs sequencing; Instructed pt in backwards technique. pt stated she prefers to have 2 people on either side of her and go forwards up her 2 steps at home   Wheelchair Mobility    Modified Rankin (Stroke Patients Only)       Balance Overall balance assessment: Needs assistance Sitting-balance support: Feet supported;No upper extremity supported Sitting balance-Leahy Scale: Good Sitting balance - Comments: seated EOB, kyphotic posture in sitting   Standing balance support: During functional activity;Bilateral upper extremity supported Standing balance-Leahy Scale: Fair Standing balance comment: static standing able to release BUE from RW, fair with RW                            Cognition Arousal/Alertness: Awake/alert Behavior During Therapy: WFL for tasks assessed/performed Overall Cognitive Status: Within Functional Limits for tasks assessed                                        Exercises Total Joint Exercises Ankle Circles/Pumps: AROM;Both;10 reps;Supine Quad Sets: AROM;Right;5 reps;Supine Short Arc Quad: AROM;Right;10 reps;Supine Heel Slides: AAROM;Right;10 reps;Supine Hip ABduction/ADduction: AAROM;Right;10 reps;Supine Long Arc Quad: AROM;Right;10 reps;Seated    General Comments        Pertinent Vitals/Pain Pain Score: 4  Pain Location: R hip Pain Descriptors / Indicators: Aching Pain Intervention(s): Limited activity within patient's tolerance;Monitored during session;Premedicated before session;Ice applied    Home Living  Prior Function            PT Goals (current goals can now be found in the care plan section) Acute Rehab PT Goals Patient Stated Goal: go shopping again PT Goal Formulation: With patient/family Time For Goal Achievement: 03/06/20 Potential to Achieve Goals: Good Progress towards PT goals: Progressing toward goals     Frequency    Min 3X/week      PT Plan Current plan remains appropriate    Co-evaluation              AM-PAC PT "6 Clicks" Mobility   Outcome Measure  Help needed turning from your back to your side while in a flat bed without using bedrails?: A Little Help needed moving from lying on your back to sitting on the side of a flat bed without using bedrails?: A Little Help needed moving to and from a bed to a chair (including a wheelchair)?: A Little Help needed standing up from a chair using your arms (e.g., wheelchair or bedside chair)?: A Little Help needed to walk in hospital room?: A Little Help needed climbing 3-5 steps with a railing? : A Little 6 Click Score: 18    End of Session Equipment Utilized During Treatment: Gait belt Activity Tolerance: Patient tolerated treatment well Patient left: with call bell/phone within reach;in chair;with chair alarm set Nurse Communication: Mobility status PT Visit Diagnosis: Unsteadiness on feet (R26.81);Other abnormalities of gait and mobility (R26.89);Muscle weakness (generalized) (M62.81);Pain Pain - Right/Left: Right Pain - part of body: Hip     Time: [id number] PT Time Calculation (min) (ACUTE ONLY): 28 min  Charges:  $Gait Training: 8-22 mins $Therapeutic Exercise: 8-22 mins                    Blondell Reveal Kistler PT 02/29/2020  Acute Rehabilitation Services Pager [phone] Office [phone]

## 2020-02-29 NOTE — Progress Notes (Signed)
Physical Therapy Treatment Patient Details Name: Stacy Case MRN: [id number] DOB: 11/10/1951 Today's Date: 02/29/2020    History of Present Illness Pt is a 69YO female s/o R THA anterior approach on 4/14 with PMH of COPD, HTN, PAD, and IBS.    PT Comments    Pt is progressing well with mobility, she ambulated 180' with RW without loss of balance. From PT standpoint, she is ready to DC home.   Follow Up Recommendations  Follow surgeon's recommendation for DC plan and follow-up therapies     Equipment Recommendations  Rolling walker with 5" wheels;3in1 (PT)    Recommendations for Other Services       Precautions / Restrictions Precautions Precautions: Fall Restrictions Weight Bearing Restrictions: No    Mobility  Bed Mobility Overal bed mobility: Needs Assistance Bed Mobility: Supine to Sit     Supine to sit: Min assist;HOB elevated     General bed mobility comments: up in recliner  Transfers Overall transfer level: Needs assistance Equipment used: Rolling walker (2 wheeled) Transfers: Sit to/from Stand Sit to Stand: Supervision         General transfer comment: VCs hand placement  Ambulation/Gait Ambulation/Gait assistance: Supervision Gait Distance (Feet): 180 Feet Assistive device: Rolling walker (2 wheeled) Gait Pattern/deviations: Step-to pattern;Decreased weight shift to right;Antalgic;Narrow base of support;Decreased step length - right;Decreased step length - left Gait velocity: decreased   General Gait Details: good sequencing, no loss of balance   Stairs Stairs: Yes Stairs assistance: Min assist Stair Management: No rails;Backwards;With walker Number of Stairs: 2 General stair comments: min A to manage RW, VCs sequencing; Instructed pt in backwards technique. pt stated she prefers to have 2 people on either side of her and go forwards up her 2 steps at home   Wheelchair Mobility    Modified Rankin (Stroke Patients Only)        Balance Overall balance assessment: Needs assistance Sitting-balance support: Feet supported;No upper extremity supported Sitting balance-Leahy Scale: Good Sitting balance - Comments: seated EOB, kyphotic posture in sitting   Standing balance support: During functional activity;Bilateral upper extremity supported Standing balance-Leahy Scale: Fair Standing balance comment: static standing able to release BUE from RW, fair with RW                            Cognition Arousal/Alertness: Awake/alert Behavior During Therapy: WFL for tasks assessed/performed Overall Cognitive Status: Within Functional Limits for tasks assessed                                           General Comments        Pertinent Vitals/Pain Pain Score: 2  Pain Location: R hip Pain Descriptors / Indicators: Sore Pain Intervention(s): Limited activity within patient's tolerance;Monitored during session;Premedicated before session;Ice applied    Home Living                      Prior Function            PT Goals (current goals can now be found in the care plan section) Acute Rehab PT Goals Patient Stated Goal: go shopping again PT Goal Formulation: With patient/family Time For Goal Achievement: 03/06/20 Potential to Achieve Goals: Good Progress towards PT goals: Progressing toward goals    Frequency    7X/week      PT  Plan Current plan remains appropriate    Co-evaluation              AM-PAC PT "6 Clicks" Mobility   Outcome Measure  Help needed turning from your back to your side while in a flat bed without using bedrails?: A Little Help needed moving from lying on your back to sitting on the side of a flat bed without using bedrails?: A Little Help needed moving to and from a bed to a chair (including a wheelchair)?: None Help needed standing up from a chair using your arms (e.g., wheelchair or bedside chair)?: None Help needed to walk in hospital  room?: None Help needed climbing 3-5 steps with a railing? : A Little 6 Click Score: 21    End of Session Equipment Utilized During Treatment: Gait belt Activity Tolerance: Patient tolerated treatment well Patient left: with call bell/phone within reach;in bed;with bed alarm set Nurse Communication: Mobility status PT Visit Diagnosis: Unsteadiness on feet (R26.81);Other abnormalities of gait and mobility (R26.89);Muscle weakness (generalized) (M62.81);Pain Pain - Right/Left: Right Pain - part of body: Hip     Time: [id number] PT Time Calculation (min) (ACUTE ONLY): 22 min  Charges:  $Gait Training: 8-22 mins $Therapeutic Exercise: 8-22 mins                    Blondell Reveal Kistler PT 02/29/2020  Acute Rehabilitation Services Pager [phone] Office [phone]

## 2020-02-29 NOTE — Care Plan (Signed)
Ortho Bundle Case Management Note  Patient Details  Name: Stacy Case MRN: [id number] Date of Birth: 10-Aug-1951  R THA on 02-28-20 DCP:  Home with dtr.  2 story home with 3 ste. DME:  RW and 3-in-1 ordered through [facility] PT:  HEP                   DME Arranged:  Walker rolling, 3-N-1 DME Agency:  [employer]  HH Arranged:  NA [place] Agency:  NA  Additional Comments: Please contact me with any questions of if this plan should need to change.  Marianne Sofia, RN,CCM [facility]  [phone] 02/29/2020, 8:59 AM

## 2020-02-29 NOTE — Progress Notes (Signed)
RN reviewed discharge instructions with patient and family. All questions answered.   Paperwork and prescriptions given.   NT rolled patient down with all belongings to family car. 

## 2020-03-06 DIAGNOSIS — R7301 Impaired fasting glucose: Secondary | ICD-10-CM | POA: Diagnosis not present

## 2020-03-06 DIAGNOSIS — N183 Chronic kidney disease, stage 3 unspecified: Secondary | ICD-10-CM | POA: Diagnosis not present

## 2020-03-06 DIAGNOSIS — D649 Anemia, unspecified: Secondary | ICD-10-CM | POA: Diagnosis not present

## 2020-03-06 DIAGNOSIS — D519 Vitamin B12 deficiency anemia, unspecified: Secondary | ICD-10-CM | POA: Diagnosis not present

## 2020-03-06 DIAGNOSIS — E782 Mixed hyperlipidemia: Secondary | ICD-10-CM | POA: Diagnosis not present

## 2020-03-06 DIAGNOSIS — K219 Gastro-esophageal reflux disease without esophagitis: Secondary | ICD-10-CM | POA: Diagnosis not present

## 2020-03-06 DIAGNOSIS — R5383 Other fatigue: Secondary | ICD-10-CM | POA: Diagnosis not present

## 2020-03-06 DIAGNOSIS — F17210 Nicotine dependence, cigarettes, uncomplicated: Secondary | ICD-10-CM | POA: Diagnosis not present

## 2020-03-06 DIAGNOSIS — D529 Folate deficiency anemia, unspecified: Secondary | ICD-10-CM | POA: Diagnosis not present

## 2020-03-06 DIAGNOSIS — I10 Essential (primary) hypertension: Secondary | ICD-10-CM | POA: Diagnosis not present

## 2020-03-06 DIAGNOSIS — E7800 Pure hypercholesterolemia, unspecified: Secondary | ICD-10-CM | POA: Diagnosis not present

## 2020-03-12 DIAGNOSIS — E782 Mixed hyperlipidemia: Secondary | ICD-10-CM | POA: Diagnosis not present

## 2020-03-12 DIAGNOSIS — I10 Essential (primary) hypertension: Secondary | ICD-10-CM | POA: Diagnosis not present

## 2020-03-12 DIAGNOSIS — I739 Peripheral vascular disease, unspecified: Secondary | ICD-10-CM | POA: Diagnosis not present

## 2020-03-12 DIAGNOSIS — D500 Iron deficiency anemia secondary to blood loss (chronic): Secondary | ICD-10-CM | POA: Diagnosis not present

## 2020-03-12 DIAGNOSIS — I872 Venous insufficiency (chronic) (peripheral): Secondary | ICD-10-CM | POA: Diagnosis not present

## 2020-03-12 DIAGNOSIS — Z6821 Body mass index (BMI) 21.0-21.9, adult: Secondary | ICD-10-CM | POA: Diagnosis not present

## 2020-03-12 DIAGNOSIS — K580 Irritable bowel syndrome with diarrhea: Secondary | ICD-10-CM | POA: Diagnosis not present

## 2020-03-12 DIAGNOSIS — Z9189 Other specified personal risk factors, not elsewhere classified: Secondary | ICD-10-CM | POA: Diagnosis not present

## 2020-03-12 DIAGNOSIS — R7301 Impaired fasting glucose: Secondary | ICD-10-CM | POA: Diagnosis not present

## 2020-03-12 DIAGNOSIS — R4582 Worries: Secondary | ICD-10-CM | POA: Diagnosis not present

## 2020-03-12 DIAGNOSIS — Z1212 Encounter for screening for malignant neoplasm of rectum: Secondary | ICD-10-CM | POA: Diagnosis not present

## 2020-03-12 DIAGNOSIS — F331 Major depressive disorder, recurrent, moderate: Secondary | ICD-10-CM | POA: Diagnosis not present

## 2020-03-12 DIAGNOSIS — K7581 Nonalcoholic steatohepatitis (NASH): Secondary | ICD-10-CM | POA: Diagnosis not present

## 2020-03-12 DIAGNOSIS — J449 Chronic obstructive pulmonary disease, unspecified: Secondary | ICD-10-CM | POA: Diagnosis not present

## 2020-03-13 NOTE — Discharge Summary (Signed)
Physician Discharge Summary   Patient ID: Stacy Case MRN: [id number] DOB/AGE: September 01, 1951 69 y.o.  Admit date: 02/28/2020 Discharge date: 02/29/2020  Primary Diagnosis: Osteoarthritis of the Right  hip  Admission Diagnoses:  Past Medical History:  Diagnosis Date  . Anxiety   . Arthritis   . Cellulitis of ankle   . COPD (chronic obstructive pulmonary disease) ([place])   . Cystocele   . Depression   . GERD (gastroesophageal reflux disease)   . Headache    hx of migraines  . Hyperlipidemia   . Hypertension   . IBS (irritable bowel syndrome)   . Peripheral arterial disease (HCC)    legs  . Pneumonia   . Right inguinal hernia    Discharge Diagnoses:   Principal Problem:   OA (osteoarthritis) of hip Active Problems:   Primary osteoarthritis of right hip  Estimated body mass index is 21.81 kg/m as calculated from the following:   Height as of this encounter: 5\' 2"  (1.575 m).   Weight as of this encounter: 54.1 kg.  Procedure:  Procedure(s) (LRB): TOTAL HIP ARTHROPLASTY ANTERIOR APPROACH (Right)   Consults: None  HPI: Stacy Case is a 69 y.o. female who has advanced end-  stage arthritis of their Right  hip with progressively worsening pain and  dysfunction.The patient has failed nonoperative management and presents for  total hip arthroplasty.   Laboratory Data: Admission on 02/28/2020, Discharged on 02/29/2020  Component Date Value Ref Range Status  . WBC 02/29/2020 8.6  4.0 - 10.5 K/uL Final  . RBC 02/29/2020 3.94  3.87 - 5.11 MIL/uL Final  . Hemoglobin 02/29/2020 8.8* 12.0 - 15.0 g/dL Final  . HCT 02/29/2020 30.9* 36.0 - 46.0 % Final  . MCV 02/29/2020 78.4* 80.0 - 100.0 fL Final  . MCH 02/29/2020 22.3* 26.0 - 34.0 pg Final  . MCHC 02/29/2020 28.5* 30.0 - 36.0 g/dL Final  . RDW 02/29/2020 16.8* 11.5 - 15.5 % Final  . Platelets 02/29/2020 256  150 - 400 K/uL Final  . nRBC 02/29/2020 0.0  0.0 - 0.2 % Final   Performed at [facility], [place]  [address], [place], [place] [address]  . Sodium 02/29/2020 138  135 - 145 mmol/L Final  . Potassium 02/29/2020 4.9  3.5 - 5.1 mmol/L Final  . Chloride 02/29/2020 105  98 - 111 mmol/L Final  . CO2 02/29/2020 23  22 - 32 mmol/L Final  . Glucose, Bld 02/29/2020 176* 70 - 99 mg/dL Final   Glucose reference range applies only to samples taken after fasting for at least 8 hours.  . BUN 02/29/2020 24* 8 - 23 mg/dL Final  . Creatinine, Ser 02/29/2020 0.87  0.44 - 1.00 mg/dL Final  . Calcium 02/29/2020 8.5* 8.9 - 10.3 mg/dL Final  . GFR calc non Af Amer 02/29/2020 >60  >60 mL/min Final  . GFR calc Af Amer 02/29/2020 >60  >60 mL/min Final  . Anion gap 02/29/2020 10  5 - 15 Final   Performed at [facility], [place] [address], [place], [place] [address]  Hospital Outpatient Visit on 02/24/2020  Component Date Value Ref Range Status  . SARS Coronavirus 2 02/24/2020 NEGATIVE  NEGATIVE Final   Comment: (NOTE) SARS-CoV-2 target nucleic acids are NOT DETECTED. The SARS-CoV-2 RNA is generally detectable in upper and lower respiratory specimens during the acute phase of infection. Negative results do not preclude SARS-CoV-2 infection, do not rule out co-infections with other pathogens, and should not be used as the  sole basis for treatment or other patient management decisions. Negative results must be combined with clinical observations, patient history, and epidemiological information. The expected result is Negative. Fact Sheet for Patients: SugarRoll.be Fact Sheet for Healthcare Providers: https://www.woods-mathews.com/ This test is not yet approved or cleared by the [place] FDA and  has been authorized for detection and/or diagnosis of SARS-CoV-2 by FDA under an Emergency Use Authorization (EUA). This EUA will remain  in effect (meaning this test can be used) for the duration of the COVID-19 declaration under Section 56                           4(b)(1) of the Act, 21 U.S.C. section 360bbb-3(b)(1), unless the authorization is terminated or revoked sooner. Performed at [facility], [place] [address], [place], [place] [address]   Hospital Outpatient Visit on 02/21/2020  Component Date Value Ref Range Status  . MRSA, PCR 02/21/2020 NEGATIVE  NEGATIVE Final  . Staphylococcus aureus 02/21/2020 NEGATIVE  NEGATIVE Final   Comment: (NOTE) The Xpert SA Assay (FDA approved for NASAL specimens in patients 74 years of age and older), is one component of a comprehensive surveillance program. It is not intended to diagnose infection nor to guide or monitor treatment. Performed at [facility], [place] [address] [place], [place] [address]   . aPTT 02/21/2020 32  24 - 36 seconds Final   Performed at [facility], [place] [address] [place], [place] [address]  . WBC 02/21/2020 7.2  4.0 - 10.5 K/uL Final  . RBC 02/21/2020 4.62  3.87 - 5.11 MIL/uL Final  . Hemoglobin 02/21/2020 10.6* 12.0 - 15.0 g/dL Final  . HCT 02/21/2020 36.5  36.0 - 46.0 % Final  . MCV 02/21/2020 79.0* 80.0 - 100.0 fL Final  . MCH 02/21/2020 22.9* 26.0 - 34.0 pg Final  . MCHC 02/21/2020 29.0* 30.0 - 36.0 g/dL Final  . RDW 02/21/2020 17.4* 11.5 - 15.5 % Final  . Platelets 02/21/2020 376  150 - 400 K/uL Final  . nRBC 02/21/2020 0.0  0.0 - 0.2 % Final   Performed at [facility], [place] [address] [place], [place] [address]  . Sodium 02/21/2020 139  135 - 145 mmol/L Final  . Potassium 02/21/2020 4.8  3.5 - 5.1 mmol/L Final  . Chloride 02/21/2020 102  98 - 111 mmol/L Final  . CO2 02/21/2020 26  22 - 32 mmol/L Final  . Glucose, Bld 02/21/2020 116* 70 - 99 mg/dL Final   Glucose reference range applies only to samples taken after fasting for at least 8 hours.  . BUN 02/21/2020 25* 8 - 23 mg/dL Final  . Creatinine, Ser 02/21/2020 0.85  0.44 - 1.00 mg/dL Final  . Calcium 02/21/2020 9.5  8.9 - 10.3 mg/dL Final  .  Total Protein 02/21/2020 7.8  6.5 - 8.1 g/dL Final  . Albumin 02/21/2020 4.1  3.5 - 5.0 g/dL Final  . AST 02/21/2020 18  15 - 41 U/L Final  . ALT 02/21/2020 12  0 - 44 U/L Final  . Alkaline Phosphatase 02/21/2020 109  38 - 126 U/L Final  . Total Bilirubin 02/21/2020 0.5  0.3 - 1.2 mg/dL Final  . GFR calc non Af Amer 02/21/2020 >60  >60 mL/min Final  . GFR calc Af Amer 02/21/2020 >60  >60 mL/min Final  . Anion gap 02/21/2020 11  5 - 15 Final   Performed at [facility], [place] [address], [place], [place] [address]  . Prothrombin Time 02/21/2020 12.7  11.4 - 15.2 seconds Final  . INR 02/21/2020 1.0  0.8 - 1.2 Final   Comment: (NOTE) INR goal varies based on device and disease states. Performed at [facility], [place] [address] [place], [place] [address]   . ABO/RH(D) 02/21/2020 O POS   Final  . Antibody Screen 02/21/2020 NEG   Final  . Sample Expiration 02/21/2020 03/02/2020,2359   Final  . Extend sample reason 02/21/2020    Final                   Value:NO TRANSFUSIONS OR PREGNANCY IN THE PAST 3 MONTHS Performed at [facility], [place] [address] [place], [place] [address]   . ABO/RH(D) 02/21/2020    Final                   Value:O POS Performed at [facility], [place] [address] [place], [place] [address]      X-Rays:DG Pelvis Portable  Result Date: 02/28/2020 CLINICAL DATA:  Status post right total hip replacement. EXAM: PORTABLE PELVIS 1-2 VIEWS COMPARISON:  Fluoroscopic images of same day. FINDINGS: The right femoral and acetabular components are well situated. Expected postoperative changes are noted in the surrounding soft tissues. IMPRESSION: Status post right total hip arthroplasty. Electronically Signed   By: Marijo Conception M.D.   On: 02/28/2020 12:44   DG C-Arm 1-60 Min-No Report  Result Date: 02/28/2020 Fluoroscopy was utilized by the requesting physician.  No radiographic interpretation.   DG HIP  OPERATIVE UNILAT W OR W/O PELVIS RIGHT  Result Date: 02/28/2020 CLINICAL DATA:  RIGHT hip replacement EXAM: OPERATIVE RIGHT HIP (WITH PELVIS IF PERFORMED) 4 VIEWS TECHNIQUE: Fluoroscopic spot image(s) were submitted for interpretation post-operatively. COMPARISON:  08/24/2017 FLUOROSCOPY TIME:  0 minutes 6 seconds Images obtained: 4 Dose: 1.21 mGy FINDINGS: Severe osteoarthritic changes of the RIGHT hip on initial image with superolateral subluxation of the femoral head, bone-on-bone appearance, spur formation and femoral head deformity. Subsequent images demonstrate placement of a RIGHT hip prosthesis without acute fracture or dislocation. Bones appear demineralized. IMPRESSION: Advanced osteoarthritis RIGHT hip with underlying osseous demineralization. New RIGHT hip prosthesis without acute osseous abnormalities. Electronically Signed   By: Lavonia Dana M.D.   On: 02/28/2020 12:08    EKG: Orders placed or performed during the hospital encounter of 02/21/20  . EKG 12 lead  . EKG 12 lead     Hospital Course: Stacy Case is a 69 y.o. who was admitted to [facility]. They were brought to the operating room on 02/28/2020 and underwent Procedure(s): [place].  Patient tolerated the procedure well and was later transferred to the recovery room and then to the orthopaedic floor for postoperative care. They were given PO and IV analgesics for pain control following their surgery. They were given 24 hours of postoperative antibiotics of  Anti-infectives (From admission, onward)   Start     Dose/Rate Route Frequency Ordered Stop   02/28/20 0915  ceFAZolin (ANCEF) IVPB 2g/100 mL premix     2 g 200 mL/hr over 30 Minutes Intravenous On call to O.R. 02/28/20 [id number] 02/28/20 1037     and started on DVT prophylaxis in the form of Aspirin.   PT and OT were ordered for total joint protocol. Discharge planning consulted to help with postop disposition and equipment needs.   Patient had a good night  on the evening of surgery. They started to get up OOB with therapy on POD #0. Pt was seen during rounds and was ready to go home pending progress with therapy. She worked with therapy on POD #1 and was meeting her goals. Pt was discharged to home later that day in stable condition.  Diet: Regular diet Activity: WBAT Follow-up: in 2 weeks Disposition: Home Discharged Condition: good   Discharge Instructions    Call MD / Call 911   Complete by: As directed    If you experience chest pain or shortness of breath, CALL 911 and be transported to the hospital emergency room.  If you develope a fever above 101 F, pus (white drainage) or increased drainage or redness at the wound, or calf pain, call your surgeon's office.   Change dressing   Complete by: As directed    You have an adhesive waterproof bandage over the incision. Leave this in place until your first follow-up appointment. Once you remove this you will not need to place another bandage.   Constipation Prevention   Complete by: As directed    Drink plenty of fluids.  Prune juice may be helpful.  You may use a stool softener, such as Colace (over the counter) 100 mg twice a day.  Use MiraLax (over the counter) for constipation as needed.   Diet - low sodium heart healthy   Complete by: As directed    Do not sit on low chairs, stoools or toilet seats, as it may be difficult to get up from low surfaces   Complete by: As directed    Driving restrictions   Complete by: As directed    No driving for two weeks   TED hose   Complete by: As directed    Use stockings (TED hose) for three weeks on both leg(s).  You may remove them at night for sleeping.   Weight bearing as tolerated   Complete by: As directed      Allergies as of 02/29/2020      Reactions   Penicillins Rash      Medication List    STOP taking these medications   acetaminophen 500 MG tablet Commonly known as: TYLENOL   aspirin 81 MG  tablet Replaced by: aspirin 325 MG EC tablet   oxyCODONE-acetaminophen 5-325 MG tablet Commonly known as: Roxicet     TAKE these medications   alendronate 70 MG tablet Commonly known as: FOSAMAX Take 70 mg by mouth every 7 (seven) days.   ascorbic acid 500 MG tablet Commonly known as: VITAMIN C Take 500 mg by mouth daily.   aspirin 325 MG EC tablet Take 1 tablet (325 mg total) by mouth 2 (two) times daily for 20 days. Then resume home dose of aspirin 81 mg daily. Replaces: aspirin 81 MG tablet Notes to patient: For blood clot prevention   atenolol 50 MG tablet Commonly known as: TENORMIN Take 50 mg by mouth daily.   atorvastatin 40 MG tablet Commonly known as: LIPITOR Take 40 mg by mouth daily.   dicyclomine 20 MG tablet Commonly known as: BENTYL Take 20 mg by mouth 4 (four) times daily - after meals and at bedtime.   diltiazem 240 MG 24 hr capsule Commonly known as: TIAZAC Take 240 mg by mouth daily.   furosemide 20 MG tablet Commonly known as: LASIX Take 20 mg by mouth daily. Notes to patient: Home regimen   HYDROcodone-acetaminophen 5-325 MG tablet Commonly known as: NORCO/VICODIN Take 1-2 tablets  by mouth every 6 (six) hours as needed for moderate pain (pain score 4-6).   iron polysaccharides 150 MG capsule Commonly known as: NIFEREX Take 150 mg by mouth 2 (two) times daily.   loratadine 10 MG tablet Commonly known as: CLARITIN Take 10 mg by mouth daily.   methocarbamol 500 MG tablet Commonly known as: ROBAXIN Take 1 tablet (500 mg total) by mouth every 6 (six) hours as needed for muscle spasms.   mirtazapine 30 MG tablet Commonly known as: REMERON Take 30 mg by mouth at bedtime.   multivitamin with minerals Tabs tablet Take 1 tablet by mouth daily.   ondansetron 8 MG tablet Commonly known as: ZOFRAN Take 8 mg by mouth every 8 (eight) hours as needed for nausea or vomiting.   pantoprazole 40 MG tablet Commonly known as: PROTONIX Take 40 mg by  mouth daily.   silver sulfADIAZINE 1 % cream Commonly known as: SILVADENE Apply 1 application topically daily as needed (irritation).   traMADol 50 MG tablet Commonly known as: ULTRAM Take 100 mg by mouth every 6 (six) hours as needed for moderate pain.   Vitamin D3 10 MCG (400 UNIT) Caps Take 400 Units by mouth daily.            Discharge Care Instructions  (From admission, onward)         Start     Ordered   02/29/20 0000  Weight bearing as tolerated     02/29/20 0754   02/29/20 0000  Change dressing    Comments: You have an adhesive waterproof bandage over the incision. Leave this in place until your first follow-up appointment. Once you remove this you will not need to place another bandage.   02/29/20 0754         Follow-up Information    Gaynelle Arabian, MD. Go on 03/12/2020.   Specialty: Orthopedic Surgery Why: You are scheduled for a post-operative appointment on 03-12-20 at 2:30 pm.  Contact information: [address] [place] [address] [phone]           Signed: Griffith Citron, PA-C Orthopedic Surgery 03/13/2020, 1:11 PM

## 2020-04-01 DIAGNOSIS — Z96641 Presence of right artificial hip joint: Secondary | ICD-10-CM | POA: Insufficient documentation

## 2020-04-02 DIAGNOSIS — Z96641 Presence of right artificial hip joint: Secondary | ICD-10-CM | POA: Diagnosis not present

## 2020-04-02 DIAGNOSIS — Z471 Aftercare following joint replacement surgery: Secondary | ICD-10-CM | POA: Diagnosis not present

## 2020-06-03 DIAGNOSIS — R5383 Other fatigue: Secondary | ICD-10-CM | POA: Diagnosis not present

## 2020-06-03 DIAGNOSIS — R771 Abnormality of globulin: Secondary | ICD-10-CM | POA: Diagnosis not present

## 2020-06-03 DIAGNOSIS — N183 Chronic kidney disease, stage 3 unspecified: Secondary | ICD-10-CM | POA: Diagnosis not present

## 2020-06-03 DIAGNOSIS — K7581 Nonalcoholic steatohepatitis (NASH): Secondary | ICD-10-CM | POA: Diagnosis not present

## 2020-06-03 DIAGNOSIS — E782 Mixed hyperlipidemia: Secondary | ICD-10-CM | POA: Diagnosis not present

## 2020-06-03 DIAGNOSIS — D500 Iron deficiency anemia secondary to blood loss (chronic): Secondary | ICD-10-CM | POA: Diagnosis not present

## 2020-06-03 DIAGNOSIS — D649 Anemia, unspecified: Secondary | ICD-10-CM | POA: Diagnosis not present

## 2020-06-03 DIAGNOSIS — D519 Vitamin B12 deficiency anemia, unspecified: Secondary | ICD-10-CM | POA: Diagnosis not present

## 2020-06-03 DIAGNOSIS — R779 Abnormality of plasma protein, unspecified: Secondary | ICD-10-CM | POA: Diagnosis not present

## 2020-06-03 DIAGNOSIS — I10 Essential (primary) hypertension: Secondary | ICD-10-CM | POA: Diagnosis not present

## 2020-06-03 DIAGNOSIS — J449 Chronic obstructive pulmonary disease, unspecified: Secondary | ICD-10-CM | POA: Diagnosis not present

## 2020-06-03 DIAGNOSIS — D529 Folate deficiency anemia, unspecified: Secondary | ICD-10-CM | POA: Diagnosis not present

## 2020-06-03 DIAGNOSIS — K219 Gastro-esophageal reflux disease without esophagitis: Secondary | ICD-10-CM | POA: Diagnosis not present

## 2020-06-03 DIAGNOSIS — E7800 Pure hypercholesterolemia, unspecified: Secondary | ICD-10-CM | POA: Diagnosis not present

## 2020-06-10 DIAGNOSIS — D500 Iron deficiency anemia secondary to blood loss (chronic): Secondary | ICD-10-CM | POA: Diagnosis not present

## 2020-06-10 DIAGNOSIS — I10 Essential (primary) hypertension: Secondary | ICD-10-CM | POA: Diagnosis not present

## 2020-06-10 DIAGNOSIS — K4090 Unilateral inguinal hernia, without obstruction or gangrene, not specified as recurrent: Secondary | ICD-10-CM | POA: Diagnosis not present

## 2020-06-10 DIAGNOSIS — I872 Venous insufficiency (chronic) (peripheral): Secondary | ICD-10-CM | POA: Diagnosis not present

## 2020-06-10 DIAGNOSIS — F331 Major depressive disorder, recurrent, moderate: Secondary | ICD-10-CM | POA: Diagnosis not present

## 2020-06-10 DIAGNOSIS — R4582 Worries: Secondary | ICD-10-CM | POA: Diagnosis not present

## 2020-06-10 DIAGNOSIS — Z0001 Encounter for general adult medical examination with abnormal findings: Secondary | ICD-10-CM | POA: Diagnosis not present

## 2020-06-10 DIAGNOSIS — N1831 Chronic kidney disease, stage 3a: Secondary | ICD-10-CM | POA: Diagnosis not present

## 2020-07-02 DIAGNOSIS — K625 Hemorrhage of anus and rectum: Secondary | ICD-10-CM | POA: Diagnosis not present

## 2020-07-02 DIAGNOSIS — K4090 Unilateral inguinal hernia, without obstruction or gangrene, not specified as recurrent: Secondary | ICD-10-CM | POA: Diagnosis not present

## 2020-07-02 DIAGNOSIS — I872 Venous insufficiency (chronic) (peripheral): Secondary | ICD-10-CM | POA: Diagnosis not present

## 2020-07-02 DIAGNOSIS — R609 Edema, unspecified: Secondary | ICD-10-CM | POA: Diagnosis not present

## 2020-07-02 DIAGNOSIS — D500 Iron deficiency anemia secondary to blood loss (chronic): Secondary | ICD-10-CM | POA: Diagnosis not present

## 2020-07-18 DIAGNOSIS — Z6820 Body mass index (BMI) 20.0-20.9, adult: Secondary | ICD-10-CM | POA: Diagnosis not present

## 2020-07-18 DIAGNOSIS — D529 Folate deficiency anemia, unspecified: Secondary | ICD-10-CM | POA: Diagnosis not present

## 2020-07-18 DIAGNOSIS — D519 Vitamin B12 deficiency anemia, unspecified: Secondary | ICD-10-CM | POA: Diagnosis not present

## 2020-07-18 DIAGNOSIS — D649 Anemia, unspecified: Secondary | ICD-10-CM | POA: Diagnosis not present

## 2020-07-18 DIAGNOSIS — L03116 Cellulitis of left lower limb: Secondary | ICD-10-CM | POA: Diagnosis not present

## 2020-07-25 DIAGNOSIS — L03116 Cellulitis of left lower limb: Secondary | ICD-10-CM | POA: Diagnosis not present

## 2020-07-29 DIAGNOSIS — I739 Peripheral vascular disease, unspecified: Secondary | ICD-10-CM | POA: Diagnosis not present

## 2020-07-29 DIAGNOSIS — M79675 Pain in left toe(s): Secondary | ICD-10-CM | POA: Diagnosis not present

## 2020-07-29 DIAGNOSIS — B351 Tinea unguium: Secondary | ICD-10-CM | POA: Diagnosis not present

## 2020-07-29 DIAGNOSIS — M79674 Pain in right toe(s): Secondary | ICD-10-CM | POA: Diagnosis not present

## 2020-07-31 DIAGNOSIS — M810 Age-related osteoporosis without current pathological fracture: Secondary | ICD-10-CM | POA: Diagnosis not present

## 2020-07-31 DIAGNOSIS — L97922 Non-pressure chronic ulcer of unspecified part of left lower leg with fat layer exposed: Secondary | ICD-10-CM | POA: Diagnosis not present

## 2020-07-31 DIAGNOSIS — L03116 Cellulitis of left lower limb: Secondary | ICD-10-CM | POA: Diagnosis not present

## 2020-07-31 DIAGNOSIS — K625 Hemorrhage of anus and rectum: Secondary | ICD-10-CM | POA: Insufficient documentation

## 2020-07-31 DIAGNOSIS — I890 Lymphedema, not elsewhere classified: Secondary | ICD-10-CM | POA: Diagnosis not present

## 2020-07-31 DIAGNOSIS — Z7982 Long term (current) use of aspirin: Secondary | ICD-10-CM | POA: Diagnosis not present

## 2020-07-31 DIAGNOSIS — I872 Venous insufficiency (chronic) (peripheral): Secondary | ICD-10-CM | POA: Diagnosis not present

## 2020-07-31 DIAGNOSIS — J449 Chronic obstructive pulmonary disease, unspecified: Secondary | ICD-10-CM | POA: Diagnosis not present

## 2020-07-31 DIAGNOSIS — R600 Localized edema: Secondary | ICD-10-CM | POA: Diagnosis not present

## 2020-07-31 DIAGNOSIS — F419 Anxiety disorder, unspecified: Secondary | ICD-10-CM | POA: Diagnosis not present

## 2020-07-31 DIAGNOSIS — Z881 Allergy status to other antibiotic agents status: Secondary | ICD-10-CM | POA: Diagnosis not present

## 2020-07-31 DIAGNOSIS — K219 Gastro-esophageal reflux disease without esophagitis: Secondary | ICD-10-CM | POA: Diagnosis not present

## 2020-07-31 DIAGNOSIS — Z880 Allergy status to penicillin: Secondary | ICD-10-CM | POA: Diagnosis not present

## 2020-07-31 DIAGNOSIS — Z7983 Long term (current) use of bisphosphonates: Secondary | ICD-10-CM | POA: Diagnosis not present

## 2020-07-31 DIAGNOSIS — I10 Essential (primary) hypertension: Secondary | ICD-10-CM | POA: Diagnosis not present

## 2020-07-31 DIAGNOSIS — Z79899 Other long term (current) drug therapy: Secondary | ICD-10-CM | POA: Diagnosis not present

## 2020-07-31 DIAGNOSIS — Z87891 Personal history of nicotine dependence: Secondary | ICD-10-CM | POA: Diagnosis not present

## 2020-07-31 DIAGNOSIS — N814 Uterovaginal prolapse, unspecified: Secondary | ICD-10-CM | POA: Insufficient documentation

## 2020-08-08 DIAGNOSIS — N813 Complete uterovaginal prolapse: Secondary | ICD-10-CM | POA: Diagnosis not present

## 2020-08-21 DIAGNOSIS — J449 Chronic obstructive pulmonary disease, unspecified: Secondary | ICD-10-CM | POA: Diagnosis not present

## 2020-08-21 DIAGNOSIS — S81811A Laceration without foreign body, right lower leg, initial encounter: Secondary | ICD-10-CM | POA: Diagnosis not present

## 2020-08-21 DIAGNOSIS — I890 Lymphedema, not elsewhere classified: Secondary | ICD-10-CM | POA: Diagnosis not present

## 2020-08-21 DIAGNOSIS — Z87891 Personal history of nicotine dependence: Secondary | ICD-10-CM | POA: Diagnosis not present

## 2020-08-21 DIAGNOSIS — L97922 Non-pressure chronic ulcer of unspecified part of left lower leg with fat layer exposed: Secondary | ICD-10-CM | POA: Diagnosis not present

## 2020-08-21 DIAGNOSIS — L03116 Cellulitis of left lower limb: Secondary | ICD-10-CM | POA: Diagnosis not present

## 2020-08-21 DIAGNOSIS — K219 Gastro-esophageal reflux disease without esophagitis: Secondary | ICD-10-CM | POA: Diagnosis not present

## 2020-08-26 DIAGNOSIS — R600 Localized edema: Secondary | ICD-10-CM | POA: Diagnosis not present

## 2020-08-26 DIAGNOSIS — M79605 Pain in left leg: Secondary | ICD-10-CM | POA: Diagnosis not present

## 2020-08-26 DIAGNOSIS — I872 Venous insufficiency (chronic) (peripheral): Secondary | ICD-10-CM | POA: Diagnosis not present

## 2020-08-26 DIAGNOSIS — M79604 Pain in right leg: Secondary | ICD-10-CM | POA: Diagnosis not present

## 2020-08-27 DIAGNOSIS — R52 Pain, unspecified: Secondary | ICD-10-CM | POA: Diagnosis not present

## 2020-08-27 DIAGNOSIS — I872 Venous insufficiency (chronic) (peripheral): Secondary | ICD-10-CM | POA: Diagnosis not present

## 2020-08-27 DIAGNOSIS — R9439 Abnormal result of other cardiovascular function study: Secondary | ICD-10-CM | POA: Diagnosis not present

## 2020-08-27 DIAGNOSIS — M7989 Other specified soft tissue disorders: Secondary | ICD-10-CM | POA: Diagnosis not present

## 2020-08-27 DIAGNOSIS — L03116 Cellulitis of left lower limb: Secondary | ICD-10-CM | POA: Diagnosis not present

## 2020-08-27 DIAGNOSIS — Z1231 Encounter for screening mammogram for malignant neoplasm of breast: Secondary | ICD-10-CM | POA: Diagnosis not present

## 2020-08-27 DIAGNOSIS — R600 Localized edema: Secondary | ICD-10-CM | POA: Diagnosis not present

## 2020-08-28 DIAGNOSIS — Z87891 Personal history of nicotine dependence: Secondary | ICD-10-CM | POA: Diagnosis not present

## 2020-08-28 DIAGNOSIS — L03116 Cellulitis of left lower limb: Secondary | ICD-10-CM | POA: Diagnosis not present

## 2020-08-28 DIAGNOSIS — J449 Chronic obstructive pulmonary disease, unspecified: Secondary | ICD-10-CM | POA: Diagnosis not present

## 2020-08-28 DIAGNOSIS — K219 Gastro-esophageal reflux disease without esophagitis: Secondary | ICD-10-CM | POA: Diagnosis not present

## 2020-08-28 DIAGNOSIS — L97922 Non-pressure chronic ulcer of unspecified part of left lower leg with fat layer exposed: Secondary | ICD-10-CM | POA: Diagnosis not present

## 2020-08-28 DIAGNOSIS — I890 Lymphedema, not elsewhere classified: Secondary | ICD-10-CM | POA: Diagnosis not present

## 2020-09-04 DIAGNOSIS — Z79899 Other long term (current) drug therapy: Secondary | ICD-10-CM | POA: Diagnosis not present

## 2020-09-04 DIAGNOSIS — I10 Essential (primary) hypertension: Secondary | ICD-10-CM | POA: Diagnosis not present

## 2020-09-04 DIAGNOSIS — Z7983 Long term (current) use of bisphosphonates: Secondary | ICD-10-CM | POA: Diagnosis not present

## 2020-09-04 DIAGNOSIS — Z7982 Long term (current) use of aspirin: Secondary | ICD-10-CM | POA: Diagnosis not present

## 2020-09-04 DIAGNOSIS — I872 Venous insufficiency (chronic) (peripheral): Secondary | ICD-10-CM | POA: Diagnosis not present

## 2020-09-04 DIAGNOSIS — Z87891 Personal history of nicotine dependence: Secondary | ICD-10-CM | POA: Diagnosis not present

## 2020-09-04 DIAGNOSIS — J449 Chronic obstructive pulmonary disease, unspecified: Secondary | ICD-10-CM | POA: Diagnosis not present

## 2020-09-04 DIAGNOSIS — Z9049 Acquired absence of other specified parts of digestive tract: Secondary | ICD-10-CM | POA: Diagnosis not present

## 2020-09-04 DIAGNOSIS — L97929 Non-pressure chronic ulcer of unspecified part of left lower leg with unspecified severity: Secondary | ICD-10-CM | POA: Diagnosis not present

## 2020-09-04 DIAGNOSIS — L97919 Non-pressure chronic ulcer of unspecified part of right lower leg with unspecified severity: Secondary | ICD-10-CM | POA: Diagnosis not present

## 2020-09-04 DIAGNOSIS — M810 Age-related osteoporosis without current pathological fracture: Secondary | ICD-10-CM | POA: Diagnosis not present

## 2020-09-04 DIAGNOSIS — I83029 Varicose veins of left lower extremity with ulcer of unspecified site: Secondary | ICD-10-CM | POA: Diagnosis not present

## 2020-09-04 DIAGNOSIS — Z881 Allergy status to other antibiotic agents status: Secondary | ICD-10-CM | POA: Diagnosis not present

## 2020-09-04 DIAGNOSIS — Z880 Allergy status to penicillin: Secondary | ICD-10-CM | POA: Diagnosis not present

## 2020-09-04 DIAGNOSIS — R600 Localized edema: Secondary | ICD-10-CM | POA: Diagnosis not present

## 2020-09-04 DIAGNOSIS — I890 Lymphedema, not elsewhere classified: Secondary | ICD-10-CM | POA: Diagnosis not present

## 2020-09-04 DIAGNOSIS — L97922 Non-pressure chronic ulcer of unspecified part of left lower leg with fat layer exposed: Secondary | ICD-10-CM | POA: Diagnosis not present

## 2020-09-09 DIAGNOSIS — K4090 Unilateral inguinal hernia, without obstruction or gangrene, not specified as recurrent: Secondary | ICD-10-CM | POA: Diagnosis not present

## 2020-09-09 DIAGNOSIS — I872 Venous insufficiency (chronic) (peripheral): Secondary | ICD-10-CM | POA: Diagnosis not present

## 2020-09-09 DIAGNOSIS — M5136 Other intervertebral disc degeneration, lumbar region: Secondary | ICD-10-CM | POA: Diagnosis not present

## 2020-09-09 DIAGNOSIS — K838 Other specified diseases of biliary tract: Secondary | ICD-10-CM | POA: Diagnosis not present

## 2020-09-09 DIAGNOSIS — L97922 Non-pressure chronic ulcer of unspecified part of left lower leg with fat layer exposed: Secondary | ICD-10-CM | POA: Diagnosis not present

## 2020-09-09 DIAGNOSIS — I70203 Unspecified atherosclerosis of native arteries of extremities, bilateral legs: Secondary | ICD-10-CM | POA: Diagnosis not present

## 2020-09-09 DIAGNOSIS — R600 Localized edema: Secondary | ICD-10-CM | POA: Diagnosis not present

## 2020-09-09 DIAGNOSIS — Z872 Personal history of diseases of the skin and subcutaneous tissue: Secondary | ICD-10-CM | POA: Diagnosis not present

## 2020-09-09 DIAGNOSIS — Z01812 Encounter for preprocedural laboratory examination: Secondary | ICD-10-CM | POA: Diagnosis not present

## 2020-09-09 DIAGNOSIS — I701 Atherosclerosis of renal artery: Secondary | ICD-10-CM | POA: Diagnosis not present

## 2020-09-09 DIAGNOSIS — M4316 Spondylolisthesis, lumbar region: Secondary | ICD-10-CM | POA: Diagnosis not present

## 2020-09-10 DIAGNOSIS — E782 Mixed hyperlipidemia: Secondary | ICD-10-CM | POA: Diagnosis not present

## 2020-09-10 DIAGNOSIS — R5383 Other fatigue: Secondary | ICD-10-CM | POA: Diagnosis not present

## 2020-09-10 DIAGNOSIS — D649 Anemia, unspecified: Secondary | ICD-10-CM | POA: Diagnosis not present

## 2020-09-10 DIAGNOSIS — N183 Chronic kidney disease, stage 3 unspecified: Secondary | ICD-10-CM | POA: Diagnosis not present

## 2020-09-10 DIAGNOSIS — E875 Hyperkalemia: Secondary | ICD-10-CM | POA: Diagnosis not present

## 2020-09-10 DIAGNOSIS — I10 Essential (primary) hypertension: Secondary | ICD-10-CM | POA: Diagnosis not present

## 2020-09-10 DIAGNOSIS — D529 Folate deficiency anemia, unspecified: Secondary | ICD-10-CM | POA: Diagnosis not present

## 2020-09-10 DIAGNOSIS — D519 Vitamin B12 deficiency anemia, unspecified: Secondary | ICD-10-CM | POA: Diagnosis not present

## 2020-09-11 DIAGNOSIS — M810 Age-related osteoporosis without current pathological fracture: Secondary | ICD-10-CM | POA: Diagnosis not present

## 2020-09-11 DIAGNOSIS — N814 Uterovaginal prolapse, unspecified: Secondary | ICD-10-CM | POA: Diagnosis not present

## 2020-09-11 DIAGNOSIS — F17211 Nicotine dependence, cigarettes, in remission: Secondary | ICD-10-CM | POA: Diagnosis not present

## 2020-09-11 DIAGNOSIS — I70242 Atherosclerosis of native arteries of left leg with ulceration of calf: Secondary | ICD-10-CM | POA: Diagnosis not present

## 2020-09-11 DIAGNOSIS — Z4800 Encounter for change or removal of nonsurgical wound dressing: Secondary | ICD-10-CM | POA: Diagnosis not present

## 2020-09-11 DIAGNOSIS — Z9181 History of falling: Secondary | ICD-10-CM | POA: Diagnosis not present

## 2020-09-11 DIAGNOSIS — I890 Lymphedema, not elsewhere classified: Secondary | ICD-10-CM | POA: Diagnosis not present

## 2020-09-11 DIAGNOSIS — Z96642 Presence of left artificial hip joint: Secondary | ICD-10-CM | POA: Diagnosis not present

## 2020-09-11 DIAGNOSIS — F419 Anxiety disorder, unspecified: Secondary | ICD-10-CM | POA: Diagnosis not present

## 2020-09-11 DIAGNOSIS — I872 Venous insufficiency (chronic) (peripheral): Secondary | ICD-10-CM | POA: Diagnosis not present

## 2020-09-11 DIAGNOSIS — L97322 Non-pressure chronic ulcer of left ankle with fat layer exposed: Secondary | ICD-10-CM | POA: Diagnosis not present

## 2020-09-11 DIAGNOSIS — J449 Chronic obstructive pulmonary disease, unspecified: Secondary | ICD-10-CM | POA: Diagnosis not present

## 2020-09-11 DIAGNOSIS — I10 Essential (primary) hypertension: Secondary | ICD-10-CM | POA: Diagnosis not present

## 2020-09-12 DIAGNOSIS — R4582 Worries: Secondary | ICD-10-CM | POA: Diagnosis not present

## 2020-09-12 DIAGNOSIS — D500 Iron deficiency anemia secondary to blood loss (chronic): Secondary | ICD-10-CM | POA: Diagnosis not present

## 2020-09-12 DIAGNOSIS — R7301 Impaired fasting glucose: Secondary | ICD-10-CM | POA: Diagnosis not present

## 2020-09-12 DIAGNOSIS — K580 Irritable bowel syndrome with diarrhea: Secondary | ICD-10-CM | POA: Diagnosis not present

## 2020-09-12 DIAGNOSIS — Z23 Encounter for immunization: Secondary | ICD-10-CM | POA: Diagnosis not present

## 2020-09-12 DIAGNOSIS — E782 Mixed hyperlipidemia: Secondary | ICD-10-CM | POA: Diagnosis not present

## 2020-09-12 DIAGNOSIS — I10 Essential (primary) hypertension: Secondary | ICD-10-CM | POA: Diagnosis not present

## 2020-09-13 DIAGNOSIS — I10 Essential (primary) hypertension: Secondary | ICD-10-CM | POA: Diagnosis not present

## 2020-09-13 DIAGNOSIS — I739 Peripheral vascular disease, unspecified: Secondary | ICD-10-CM | POA: Diagnosis not present

## 2020-09-13 DIAGNOSIS — Z20822 Contact with and (suspected) exposure to covid-19: Secondary | ICD-10-CM | POA: Diagnosis not present

## 2020-09-13 DIAGNOSIS — Z880 Allergy status to penicillin: Secondary | ICD-10-CM | POA: Diagnosis not present

## 2020-09-13 DIAGNOSIS — Z79899 Other long term (current) drug therapy: Secondary | ICD-10-CM | POA: Diagnosis not present

## 2020-09-13 DIAGNOSIS — D649 Anemia, unspecified: Secondary | ICD-10-CM | POA: Diagnosis not present

## 2020-09-13 DIAGNOSIS — J449 Chronic obstructive pulmonary disease, unspecified: Secondary | ICD-10-CM | POA: Diagnosis not present

## 2020-09-13 DIAGNOSIS — Z01818 Encounter for other preprocedural examination: Secondary | ICD-10-CM | POA: Diagnosis not present

## 2020-09-13 DIAGNOSIS — Z9181 History of falling: Secondary | ICD-10-CM | POA: Insufficient documentation

## 2020-09-13 DIAGNOSIS — Z01812 Encounter for preprocedural laboratory examination: Secondary | ICD-10-CM | POA: Diagnosis not present

## 2020-09-13 DIAGNOSIS — N813 Complete uterovaginal prolapse: Secondary | ICD-10-CM | POA: Diagnosis not present

## 2020-09-16 DIAGNOSIS — D649 Anemia, unspecified: Secondary | ICD-10-CM | POA: Diagnosis present

## 2020-09-16 DIAGNOSIS — Z87891 Personal history of nicotine dependence: Secondary | ICD-10-CM | POA: Diagnosis not present

## 2020-09-16 DIAGNOSIS — N814 Uterovaginal prolapse, unspecified: Secondary | ICD-10-CM | POA: Diagnosis not present

## 2020-09-16 DIAGNOSIS — I10 Essential (primary) hypertension: Secondary | ICD-10-CM | POA: Diagnosis present

## 2020-09-16 DIAGNOSIS — Z20822 Contact with and (suspected) exposure to covid-19: Secondary | ICD-10-CM | POA: Diagnosis not present

## 2020-09-16 DIAGNOSIS — N813 Complete uterovaginal prolapse: Secondary | ICD-10-CM | POA: Diagnosis not present

## 2020-09-16 DIAGNOSIS — Z01812 Encounter for preprocedural laboratory examination: Secondary | ICD-10-CM | POA: Diagnosis not present

## 2020-09-16 DIAGNOSIS — I739 Peripheral vascular disease, unspecified: Secondary | ICD-10-CM | POA: Diagnosis present

## 2020-09-16 DIAGNOSIS — Z880 Allergy status to penicillin: Secondary | ICD-10-CM | POA: Diagnosis not present

## 2020-09-16 DIAGNOSIS — J449 Chronic obstructive pulmonary disease, unspecified: Secondary | ICD-10-CM | POA: Diagnosis not present

## 2020-09-16 DIAGNOSIS — N858 Other specified noninflammatory disorders of uterus: Secondary | ICD-10-CM | POA: Diagnosis not present

## 2020-09-18 DIAGNOSIS — I10 Essential (primary) hypertension: Secondary | ICD-10-CM | POA: Diagnosis not present

## 2020-09-18 DIAGNOSIS — I872 Venous insufficiency (chronic) (peripheral): Secondary | ICD-10-CM | POA: Diagnosis not present

## 2020-09-18 DIAGNOSIS — L97322 Non-pressure chronic ulcer of left ankle with fat layer exposed: Secondary | ICD-10-CM | POA: Diagnosis not present

## 2020-09-18 DIAGNOSIS — Z4800 Encounter for change or removal of nonsurgical wound dressing: Secondary | ICD-10-CM | POA: Diagnosis not present

## 2020-09-18 DIAGNOSIS — I890 Lymphedema, not elsewhere classified: Secondary | ICD-10-CM | POA: Diagnosis not present

## 2020-09-18 DIAGNOSIS — J449 Chronic obstructive pulmonary disease, unspecified: Secondary | ICD-10-CM | POA: Diagnosis not present

## 2020-09-25 DIAGNOSIS — I83023 Varicose veins of left lower extremity with ulcer of ankle: Secondary | ICD-10-CM | POA: Diagnosis not present

## 2020-09-25 DIAGNOSIS — I872 Venous insufficiency (chronic) (peripheral): Secondary | ICD-10-CM | POA: Diagnosis not present

## 2020-09-25 DIAGNOSIS — I83029 Varicose veins of left lower extremity with ulcer of unspecified site: Secondary | ICD-10-CM | POA: Diagnosis not present

## 2020-09-25 DIAGNOSIS — L97329 Non-pressure chronic ulcer of left ankle with unspecified severity: Secondary | ICD-10-CM | POA: Diagnosis not present

## 2020-09-25 DIAGNOSIS — L97929 Non-pressure chronic ulcer of unspecified part of left lower leg with unspecified severity: Secondary | ICD-10-CM | POA: Diagnosis not present

## 2020-09-25 DIAGNOSIS — R600 Localized edema: Secondary | ICD-10-CM | POA: Diagnosis not present

## 2020-09-25 DIAGNOSIS — J449 Chronic obstructive pulmonary disease, unspecified: Secondary | ICD-10-CM | POA: Diagnosis not present

## 2020-09-25 DIAGNOSIS — I890 Lymphedema, not elsewhere classified: Secondary | ICD-10-CM | POA: Diagnosis not present

## 2020-10-02 DIAGNOSIS — Z96643 Presence of artificial hip joint, bilateral: Secondary | ICD-10-CM | POA: Diagnosis not present

## 2020-10-02 DIAGNOSIS — I10 Essential (primary) hypertension: Secondary | ICD-10-CM | POA: Diagnosis not present

## 2020-10-02 DIAGNOSIS — L97329 Non-pressure chronic ulcer of left ankle with unspecified severity: Secondary | ICD-10-CM | POA: Diagnosis not present

## 2020-10-02 DIAGNOSIS — K4090 Unilateral inguinal hernia, without obstruction or gangrene, not specified as recurrent: Secondary | ICD-10-CM | POA: Diagnosis not present

## 2020-10-02 DIAGNOSIS — Z87891 Personal history of nicotine dependence: Secondary | ICD-10-CM | POA: Diagnosis not present

## 2020-10-02 DIAGNOSIS — I701 Atherosclerosis of renal artery: Secondary | ICD-10-CM | POA: Diagnosis not present

## 2020-10-02 DIAGNOSIS — Z79899 Other long term (current) drug therapy: Secondary | ICD-10-CM | POA: Diagnosis not present

## 2020-10-02 DIAGNOSIS — K838 Other specified diseases of biliary tract: Secondary | ICD-10-CM | POA: Diagnosis not present

## 2020-10-02 DIAGNOSIS — M5136 Other intervertebral disc degeneration, lumbar region: Secondary | ICD-10-CM | POA: Diagnosis not present

## 2020-10-02 DIAGNOSIS — Z20822 Contact with and (suspected) exposure to covid-19: Secondary | ICD-10-CM | POA: Diagnosis not present

## 2020-10-02 DIAGNOSIS — Z7983 Long term (current) use of bisphosphonates: Secondary | ICD-10-CM | POA: Diagnosis not present

## 2020-10-02 DIAGNOSIS — L97922 Non-pressure chronic ulcer of unspecified part of left lower leg with fat layer exposed: Secondary | ICD-10-CM | POA: Diagnosis not present

## 2020-10-02 DIAGNOSIS — J449 Chronic obstructive pulmonary disease, unspecified: Secondary | ICD-10-CM | POA: Diagnosis not present

## 2020-10-02 DIAGNOSIS — Z90710 Acquired absence of both cervix and uterus: Secondary | ICD-10-CM | POA: Diagnosis not present

## 2020-10-02 DIAGNOSIS — Z9049 Acquired absence of other specified parts of digestive tract: Secondary | ICD-10-CM | POA: Diagnosis not present

## 2020-10-02 DIAGNOSIS — I70203 Unspecified atherosclerosis of native arteries of extremities, bilateral legs: Secondary | ICD-10-CM | POA: Diagnosis not present

## 2020-10-02 DIAGNOSIS — L97322 Non-pressure chronic ulcer of left ankle with fat layer exposed: Secondary | ICD-10-CM | POA: Diagnosis not present

## 2020-10-02 DIAGNOSIS — Z7982 Long term (current) use of aspirin: Secondary | ICD-10-CM | POA: Diagnosis not present

## 2020-10-02 DIAGNOSIS — M4316 Spondylolisthesis, lumbar region: Secondary | ICD-10-CM | POA: Diagnosis not present

## 2020-10-02 DIAGNOSIS — I83023 Varicose veins of left lower extremity with ulcer of ankle: Secondary | ICD-10-CM | POA: Diagnosis not present

## 2020-10-02 DIAGNOSIS — I700 Atherosclerosis of aorta: Secondary | ICD-10-CM | POA: Diagnosis not present

## 2020-10-07 DIAGNOSIS — D500 Iron deficiency anemia secondary to blood loss (chronic): Secondary | ICD-10-CM | POA: Diagnosis not present

## 2020-10-07 DIAGNOSIS — B370 Candidal stomatitis: Secondary | ICD-10-CM | POA: Diagnosis not present

## 2020-10-07 DIAGNOSIS — L97929 Non-pressure chronic ulcer of unspecified part of left lower leg with unspecified severity: Secondary | ICD-10-CM | POA: Diagnosis not present

## 2020-10-08 DIAGNOSIS — I10 Essential (primary) hypertension: Secondary | ICD-10-CM | POA: Diagnosis not present

## 2020-10-08 DIAGNOSIS — I890 Lymphedema, not elsewhere classified: Secondary | ICD-10-CM | POA: Diagnosis not present

## 2020-10-08 DIAGNOSIS — Z4800 Encounter for change or removal of nonsurgical wound dressing: Secondary | ICD-10-CM | POA: Diagnosis not present

## 2020-10-08 DIAGNOSIS — J449 Chronic obstructive pulmonary disease, unspecified: Secondary | ICD-10-CM | POA: Diagnosis not present

## 2020-10-08 DIAGNOSIS — L97322 Non-pressure chronic ulcer of left ankle with fat layer exposed: Secondary | ICD-10-CM | POA: Diagnosis not present

## 2020-10-08 DIAGNOSIS — I872 Venous insufficiency (chronic) (peripheral): Secondary | ICD-10-CM | POA: Diagnosis not present

## 2020-10-11 DIAGNOSIS — F17211 Nicotine dependence, cigarettes, in remission: Secondary | ICD-10-CM | POA: Diagnosis not present

## 2020-10-11 DIAGNOSIS — L97322 Non-pressure chronic ulcer of left ankle with fat layer exposed: Secondary | ICD-10-CM | POA: Diagnosis not present

## 2020-10-11 DIAGNOSIS — Z4800 Encounter for change or removal of nonsurgical wound dressing: Secondary | ICD-10-CM | POA: Diagnosis not present

## 2020-10-11 DIAGNOSIS — Z9181 History of falling: Secondary | ICD-10-CM | POA: Diagnosis not present

## 2020-10-11 DIAGNOSIS — N814 Uterovaginal prolapse, unspecified: Secondary | ICD-10-CM | POA: Diagnosis not present

## 2020-10-11 DIAGNOSIS — Z96642 Presence of left artificial hip joint: Secondary | ICD-10-CM | POA: Diagnosis not present

## 2020-10-11 DIAGNOSIS — I890 Lymphedema, not elsewhere classified: Secondary | ICD-10-CM | POA: Diagnosis not present

## 2020-10-11 DIAGNOSIS — J449 Chronic obstructive pulmonary disease, unspecified: Secondary | ICD-10-CM | POA: Diagnosis not present

## 2020-10-11 DIAGNOSIS — M810 Age-related osteoporosis without current pathological fracture: Secondary | ICD-10-CM | POA: Diagnosis not present

## 2020-10-11 DIAGNOSIS — F419 Anxiety disorder, unspecified: Secondary | ICD-10-CM | POA: Diagnosis not present

## 2020-10-11 DIAGNOSIS — I872 Venous insufficiency (chronic) (peripheral): Secondary | ICD-10-CM | POA: Diagnosis not present

## 2020-10-11 DIAGNOSIS — I10 Essential (primary) hypertension: Secondary | ICD-10-CM | POA: Diagnosis not present

## 2020-10-16 DIAGNOSIS — Z7982 Long term (current) use of aspirin: Secondary | ICD-10-CM | POA: Diagnosis not present

## 2020-10-16 DIAGNOSIS — E785 Hyperlipidemia, unspecified: Secondary | ICD-10-CM | POA: Diagnosis present

## 2020-10-16 DIAGNOSIS — Z7983 Long term (current) use of bisphosphonates: Secondary | ICD-10-CM | POA: Diagnosis not present

## 2020-10-16 DIAGNOSIS — Z96641 Presence of right artificial hip joint: Secondary | ICD-10-CM | POA: Diagnosis present

## 2020-10-16 DIAGNOSIS — D649 Anemia, unspecified: Secondary | ICD-10-CM | POA: Diagnosis not present

## 2020-10-16 DIAGNOSIS — Z87891 Personal history of nicotine dependence: Secondary | ICD-10-CM | POA: Diagnosis not present

## 2020-10-16 DIAGNOSIS — M810 Age-related osteoporosis without current pathological fracture: Secondary | ICD-10-CM | POA: Diagnosis present

## 2020-10-16 DIAGNOSIS — B964 Proteus (mirabilis) (morganii) as the cause of diseases classified elsewhere: Secondary | ICD-10-CM | POA: Diagnosis present

## 2020-10-16 DIAGNOSIS — M818 Other osteoporosis without current pathological fracture: Secondary | ICD-10-CM | POA: Diagnosis not present

## 2020-10-16 DIAGNOSIS — D500 Iron deficiency anemia secondary to blood loss (chronic): Secondary | ICD-10-CM | POA: Diagnosis present

## 2020-10-16 DIAGNOSIS — K219 Gastro-esophageal reflux disease without esophagitis: Secondary | ICD-10-CM | POA: Diagnosis present

## 2020-10-16 DIAGNOSIS — B950 Streptococcus, group A, as the cause of diseases classified elsewhere: Secondary | ICD-10-CM | POA: Diagnosis present

## 2020-10-16 DIAGNOSIS — K580 Irritable bowel syndrome with diarrhea: Secondary | ICD-10-CM | POA: Diagnosis present

## 2020-10-16 DIAGNOSIS — I70249 Atherosclerosis of native arteries of left leg with ulceration of unspecified site: Secondary | ICD-10-CM | POA: Diagnosis present

## 2020-10-16 DIAGNOSIS — L03116 Cellulitis of left lower limb: Secondary | ICD-10-CM | POA: Diagnosis present

## 2020-10-16 DIAGNOSIS — L97929 Non-pressure chronic ulcer of unspecified part of left lower leg with unspecified severity: Secondary | ICD-10-CM | POA: Diagnosis not present

## 2020-10-16 DIAGNOSIS — L97329 Non-pressure chronic ulcer of left ankle with unspecified severity: Secondary | ICD-10-CM | POA: Diagnosis present

## 2020-10-16 DIAGNOSIS — M1990 Unspecified osteoarthritis, unspecified site: Secondary | ICD-10-CM | POA: Diagnosis present

## 2020-10-16 DIAGNOSIS — I10 Essential (primary) hypertension: Secondary | ICD-10-CM | POA: Diagnosis present

## 2020-10-16 DIAGNOSIS — I739 Peripheral vascular disease, unspecified: Secondary | ICD-10-CM | POA: Diagnosis not present

## 2020-10-16 DIAGNOSIS — I879 Disorder of vein, unspecified: Secondary | ICD-10-CM | POA: Diagnosis not present

## 2020-10-16 DIAGNOSIS — L97921 Non-pressure chronic ulcer of unspecified part of left lower leg limited to breakdown of skin: Secondary | ICD-10-CM | POA: Diagnosis present

## 2020-10-16 DIAGNOSIS — I872 Venous insufficiency (chronic) (peripheral): Secondary | ICD-10-CM | POA: Diagnosis present

## 2020-10-16 DIAGNOSIS — J449 Chronic obstructive pulmonary disease, unspecified: Secondary | ICD-10-CM | POA: Diagnosis present

## 2020-10-16 DIAGNOSIS — Z20822 Contact with and (suspected) exposure to covid-19: Secondary | ICD-10-CM | POA: Diagnosis present

## 2020-10-16 DIAGNOSIS — F3340 Major depressive disorder, recurrent, in remission, unspecified: Secondary | ICD-10-CM | POA: Diagnosis present

## 2020-10-17 DIAGNOSIS — K219 Gastro-esophageal reflux disease without esophagitis: Secondary | ICD-10-CM | POA: Insufficient documentation

## 2020-10-17 DIAGNOSIS — F321 Major depressive disorder, single episode, moderate: Secondary | ICD-10-CM | POA: Insufficient documentation

## 2020-10-17 DIAGNOSIS — D500 Iron deficiency anemia secondary to blood loss (chronic): Secondary | ICD-10-CM | POA: Insufficient documentation

## 2020-10-17 DIAGNOSIS — M810 Age-related osteoporosis without current pathological fracture: Secondary | ICD-10-CM | POA: Insufficient documentation

## 2020-10-17 DIAGNOSIS — K580 Irritable bowel syndrome with diarrhea: Secondary | ICD-10-CM | POA: Insufficient documentation

## 2020-10-23 DIAGNOSIS — I10 Essential (primary) hypertension: Secondary | ICD-10-CM | POA: Diagnosis not present

## 2020-10-23 DIAGNOSIS — I872 Venous insufficiency (chronic) (peripheral): Secondary | ICD-10-CM | POA: Diagnosis not present

## 2020-10-23 DIAGNOSIS — Z4800 Encounter for change or removal of nonsurgical wound dressing: Secondary | ICD-10-CM | POA: Diagnosis not present

## 2020-10-23 DIAGNOSIS — L97322 Non-pressure chronic ulcer of left ankle with fat layer exposed: Secondary | ICD-10-CM | POA: Diagnosis not present

## 2020-10-23 DIAGNOSIS — J449 Chronic obstructive pulmonary disease, unspecified: Secondary | ICD-10-CM | POA: Diagnosis not present

## 2020-10-23 DIAGNOSIS — I890 Lymphedema, not elsewhere classified: Secondary | ICD-10-CM | POA: Diagnosis not present

## 2020-10-24 DIAGNOSIS — I10 Essential (primary) hypertension: Secondary | ICD-10-CM | POA: Diagnosis not present

## 2020-10-24 DIAGNOSIS — J449 Chronic obstructive pulmonary disease, unspecified: Secondary | ICD-10-CM | POA: Diagnosis not present

## 2020-10-24 DIAGNOSIS — I872 Venous insufficiency (chronic) (peripheral): Secondary | ICD-10-CM | POA: Diagnosis not present

## 2020-10-24 DIAGNOSIS — L97322 Non-pressure chronic ulcer of left ankle with fat layer exposed: Secondary | ICD-10-CM | POA: Diagnosis not present

## 2020-10-24 DIAGNOSIS — I890 Lymphedema, not elsewhere classified: Secondary | ICD-10-CM | POA: Diagnosis not present

## 2020-10-24 DIAGNOSIS — Z4800 Encounter for change or removal of nonsurgical wound dressing: Secondary | ICD-10-CM | POA: Diagnosis not present

## 2020-10-25 DIAGNOSIS — J449 Chronic obstructive pulmonary disease, unspecified: Secondary | ICD-10-CM | POA: Diagnosis not present

## 2020-10-25 DIAGNOSIS — L97922 Non-pressure chronic ulcer of unspecified part of left lower leg with fat layer exposed: Secondary | ICD-10-CM | POA: Diagnosis not present

## 2020-10-25 DIAGNOSIS — Z20822 Contact with and (suspected) exposure to covid-19: Secondary | ICD-10-CM | POA: Diagnosis not present

## 2020-10-25 DIAGNOSIS — I70203 Unspecified atherosclerosis of native arteries of extremities, bilateral legs: Secondary | ICD-10-CM | POA: Diagnosis not present

## 2020-10-25 DIAGNOSIS — I83023 Varicose veins of left lower extremity with ulcer of ankle: Secondary | ICD-10-CM | POA: Diagnosis not present

## 2020-10-25 DIAGNOSIS — L97329 Non-pressure chronic ulcer of left ankle with unspecified severity: Secondary | ICD-10-CM | POA: Diagnosis not present

## 2020-10-29 DIAGNOSIS — I890 Lymphedema, not elsewhere classified: Secondary | ICD-10-CM | POA: Diagnosis not present

## 2020-10-29 DIAGNOSIS — Z4800 Encounter for change or removal of nonsurgical wound dressing: Secondary | ICD-10-CM | POA: Diagnosis not present

## 2020-10-29 DIAGNOSIS — I10 Essential (primary) hypertension: Secondary | ICD-10-CM | POA: Diagnosis not present

## 2020-10-29 DIAGNOSIS — J449 Chronic obstructive pulmonary disease, unspecified: Secondary | ICD-10-CM | POA: Diagnosis not present

## 2020-10-29 DIAGNOSIS — I872 Venous insufficiency (chronic) (peripheral): Secondary | ICD-10-CM | POA: Diagnosis not present

## 2020-10-29 DIAGNOSIS — L97322 Non-pressure chronic ulcer of left ankle with fat layer exposed: Secondary | ICD-10-CM | POA: Diagnosis not present

## 2020-10-30 DIAGNOSIS — M5136 Other intervertebral disc degeneration, lumbar region: Secondary | ICD-10-CM | POA: Diagnosis not present

## 2020-10-30 DIAGNOSIS — I83023 Varicose veins of left lower extremity with ulcer of ankle: Secondary | ICD-10-CM | POA: Diagnosis not present

## 2020-10-30 DIAGNOSIS — M4316 Spondylolisthesis, lumbar region: Secondary | ICD-10-CM | POA: Diagnosis not present

## 2020-10-30 DIAGNOSIS — S81801A Unspecified open wound, right lower leg, initial encounter: Secondary | ICD-10-CM | POA: Diagnosis not present

## 2020-10-30 DIAGNOSIS — I10 Essential (primary) hypertension: Secondary | ICD-10-CM | POA: Diagnosis not present

## 2020-10-30 DIAGNOSIS — Z96643 Presence of artificial hip joint, bilateral: Secondary | ICD-10-CM | POA: Diagnosis not present

## 2020-10-30 DIAGNOSIS — Z79899 Other long term (current) drug therapy: Secondary | ICD-10-CM | POA: Diagnosis not present

## 2020-10-30 DIAGNOSIS — Z7982 Long term (current) use of aspirin: Secondary | ICD-10-CM | POA: Diagnosis not present

## 2020-10-30 DIAGNOSIS — K4090 Unilateral inguinal hernia, without obstruction or gangrene, not specified as recurrent: Secondary | ICD-10-CM | POA: Diagnosis not present

## 2020-10-30 DIAGNOSIS — I70203 Unspecified atherosclerosis of native arteries of extremities, bilateral legs: Secondary | ICD-10-CM | POA: Diagnosis not present

## 2020-10-30 DIAGNOSIS — Z87891 Personal history of nicotine dependence: Secondary | ICD-10-CM | POA: Diagnosis not present

## 2020-10-30 DIAGNOSIS — L97322 Non-pressure chronic ulcer of left ankle with fat layer exposed: Secondary | ICD-10-CM | POA: Diagnosis not present

## 2020-10-30 DIAGNOSIS — I872 Venous insufficiency (chronic) (peripheral): Secondary | ICD-10-CM | POA: Diagnosis not present

## 2020-10-30 DIAGNOSIS — L03116 Cellulitis of left lower limb: Secondary | ICD-10-CM | POA: Diagnosis not present

## 2020-10-30 DIAGNOSIS — L97922 Non-pressure chronic ulcer of unspecified part of left lower leg with fat layer exposed: Secondary | ICD-10-CM | POA: Diagnosis not present

## 2020-10-30 DIAGNOSIS — Z90710 Acquired absence of both cervix and uterus: Secondary | ICD-10-CM | POA: Diagnosis not present

## 2020-10-30 DIAGNOSIS — J449 Chronic obstructive pulmonary disease, unspecified: Secondary | ICD-10-CM | POA: Diagnosis not present

## 2020-10-30 DIAGNOSIS — Z7983 Long term (current) use of bisphosphonates: Secondary | ICD-10-CM | POA: Diagnosis not present

## 2020-10-30 DIAGNOSIS — L97329 Non-pressure chronic ulcer of left ankle with unspecified severity: Secondary | ICD-10-CM | POA: Diagnosis not present

## 2020-10-30 DIAGNOSIS — K838 Other specified diseases of biliary tract: Secondary | ICD-10-CM | POA: Diagnosis not present

## 2020-10-30 DIAGNOSIS — I890 Lymphedema, not elsewhere classified: Secondary | ICD-10-CM | POA: Diagnosis not present

## 2020-10-30 DIAGNOSIS — Z9049 Acquired absence of other specified parts of digestive tract: Secondary | ICD-10-CM | POA: Diagnosis not present

## 2020-10-30 DIAGNOSIS — I700 Atherosclerosis of aorta: Secondary | ICD-10-CM | POA: Diagnosis not present

## 2020-10-30 DIAGNOSIS — I701 Atherosclerosis of renal artery: Secondary | ICD-10-CM | POA: Diagnosis not present

## 2020-10-31 DIAGNOSIS — I872 Venous insufficiency (chronic) (peripheral): Secondary | ICD-10-CM | POA: Diagnosis not present

## 2020-10-31 DIAGNOSIS — I10 Essential (primary) hypertension: Secondary | ICD-10-CM | POA: Diagnosis not present

## 2020-10-31 DIAGNOSIS — I890 Lymphedema, not elsewhere classified: Secondary | ICD-10-CM | POA: Diagnosis not present

## 2020-10-31 DIAGNOSIS — L97322 Non-pressure chronic ulcer of left ankle with fat layer exposed: Secondary | ICD-10-CM | POA: Diagnosis not present

## 2020-10-31 DIAGNOSIS — Z4800 Encounter for change or removal of nonsurgical wound dressing: Secondary | ICD-10-CM | POA: Diagnosis not present

## 2020-10-31 DIAGNOSIS — J449 Chronic obstructive pulmonary disease, unspecified: Secondary | ICD-10-CM | POA: Diagnosis not present

## 2020-11-01 DIAGNOSIS — D510 Vitamin B12 deficiency anemia due to intrinsic factor deficiency: Secondary | ICD-10-CM | POA: Diagnosis not present

## 2020-11-01 DIAGNOSIS — F331 Major depressive disorder, recurrent, moderate: Secondary | ICD-10-CM | POA: Diagnosis not present

## 2020-11-01 DIAGNOSIS — D529 Folate deficiency anemia, unspecified: Secondary | ICD-10-CM | POA: Diagnosis not present

## 2020-11-01 DIAGNOSIS — L280 Lichen simplex chronicus: Secondary | ICD-10-CM | POA: Diagnosis not present

## 2020-11-01 DIAGNOSIS — L03116 Cellulitis of left lower limb: Secondary | ICD-10-CM | POA: Diagnosis not present

## 2020-11-01 DIAGNOSIS — D649 Anemia, unspecified: Secondary | ICD-10-CM | POA: Diagnosis not present

## 2020-11-01 DIAGNOSIS — Z6820 Body mass index (BMI) 20.0-20.9, adult: Secondary | ICD-10-CM | POA: Diagnosis not present

## 2020-11-04 DIAGNOSIS — I83023 Varicose veins of left lower extremity with ulcer of ankle: Secondary | ICD-10-CM | POA: Diagnosis not present

## 2020-11-04 DIAGNOSIS — S81801A Unspecified open wound, right lower leg, initial encounter: Secondary | ICD-10-CM | POA: Diagnosis not present

## 2020-11-04 DIAGNOSIS — L97922 Non-pressure chronic ulcer of unspecified part of left lower leg with fat layer exposed: Secondary | ICD-10-CM | POA: Diagnosis not present

## 2020-11-04 DIAGNOSIS — L97329 Non-pressure chronic ulcer of left ankle with unspecified severity: Secondary | ICD-10-CM | POA: Diagnosis not present

## 2020-11-04 DIAGNOSIS — I890 Lymphedema, not elsewhere classified: Secondary | ICD-10-CM | POA: Diagnosis not present

## 2020-11-04 DIAGNOSIS — L03116 Cellulitis of left lower limb: Secondary | ICD-10-CM | POA: Diagnosis not present

## 2020-11-05 DIAGNOSIS — I10 Essential (primary) hypertension: Secondary | ICD-10-CM | POA: Diagnosis not present

## 2020-11-05 DIAGNOSIS — I872 Venous insufficiency (chronic) (peripheral): Secondary | ICD-10-CM | POA: Diagnosis not present

## 2020-11-05 DIAGNOSIS — J449 Chronic obstructive pulmonary disease, unspecified: Secondary | ICD-10-CM | POA: Diagnosis not present

## 2020-11-05 DIAGNOSIS — L97322 Non-pressure chronic ulcer of left ankle with fat layer exposed: Secondary | ICD-10-CM | POA: Diagnosis not present

## 2020-11-05 DIAGNOSIS — I890 Lymphedema, not elsewhere classified: Secondary | ICD-10-CM | POA: Diagnosis not present

## 2020-11-05 DIAGNOSIS — Z4800 Encounter for change or removal of nonsurgical wound dressing: Secondary | ICD-10-CM | POA: Diagnosis not present

## 2020-11-06 DIAGNOSIS — Z4800 Encounter for change or removal of nonsurgical wound dressing: Secondary | ICD-10-CM | POA: Diagnosis not present

## 2020-11-06 DIAGNOSIS — I890 Lymphedema, not elsewhere classified: Secondary | ICD-10-CM | POA: Diagnosis not present

## 2020-11-06 DIAGNOSIS — L97322 Non-pressure chronic ulcer of left ankle with fat layer exposed: Secondary | ICD-10-CM | POA: Diagnosis not present

## 2020-11-06 DIAGNOSIS — I872 Venous insufficiency (chronic) (peripheral): Secondary | ICD-10-CM | POA: Diagnosis not present

## 2020-11-06 DIAGNOSIS — I10 Essential (primary) hypertension: Secondary | ICD-10-CM | POA: Diagnosis not present

## 2020-11-06 DIAGNOSIS — J449 Chronic obstructive pulmonary disease, unspecified: Secondary | ICD-10-CM | POA: Diagnosis not present

## 2020-11-07 DIAGNOSIS — Z01818 Encounter for other preprocedural examination: Secondary | ICD-10-CM | POA: Diagnosis not present

## 2020-11-07 DIAGNOSIS — L03116 Cellulitis of left lower limb: Secondary | ICD-10-CM | POA: Diagnosis not present

## 2020-11-07 DIAGNOSIS — L97922 Non-pressure chronic ulcer of unspecified part of left lower leg with fat layer exposed: Secondary | ICD-10-CM | POA: Diagnosis not present

## 2020-11-07 DIAGNOSIS — I83023 Varicose veins of left lower extremity with ulcer of ankle: Secondary | ICD-10-CM | POA: Diagnosis not present

## 2020-11-07 DIAGNOSIS — S81801A Unspecified open wound, right lower leg, initial encounter: Secondary | ICD-10-CM | POA: Diagnosis not present

## 2020-11-07 DIAGNOSIS — L97329 Non-pressure chronic ulcer of left ankle with unspecified severity: Secondary | ICD-10-CM | POA: Diagnosis not present

## 2020-11-07 DIAGNOSIS — I890 Lymphedema, not elsewhere classified: Secondary | ICD-10-CM | POA: Diagnosis not present

## 2020-11-10 DIAGNOSIS — J449 Chronic obstructive pulmonary disease, unspecified: Secondary | ICD-10-CM | POA: Diagnosis not present

## 2020-11-10 DIAGNOSIS — N814 Uterovaginal prolapse, unspecified: Secondary | ICD-10-CM | POA: Diagnosis not present

## 2020-11-10 DIAGNOSIS — B950 Streptococcus, group A, as the cause of diseases classified elsewhere: Secondary | ICD-10-CM | POA: Diagnosis not present

## 2020-11-10 DIAGNOSIS — I872 Venous insufficiency (chronic) (peripheral): Secondary | ICD-10-CM | POA: Diagnosis not present

## 2020-11-10 DIAGNOSIS — L03116 Cellulitis of left lower limb: Secondary | ICD-10-CM | POA: Diagnosis not present

## 2020-11-10 DIAGNOSIS — Z9181 History of falling: Secondary | ICD-10-CM | POA: Diagnosis not present

## 2020-11-10 DIAGNOSIS — M810 Age-related osteoporosis without current pathological fracture: Secondary | ICD-10-CM | POA: Diagnosis not present

## 2020-11-10 DIAGNOSIS — S81801D Unspecified open wound, right lower leg, subsequent encounter: Secondary | ICD-10-CM | POA: Diagnosis not present

## 2020-11-10 DIAGNOSIS — L97322 Non-pressure chronic ulcer of left ankle with fat layer exposed: Secondary | ICD-10-CM | POA: Diagnosis not present

## 2020-11-10 DIAGNOSIS — F17211 Nicotine dependence, cigarettes, in remission: Secondary | ICD-10-CM | POA: Diagnosis not present

## 2020-11-10 DIAGNOSIS — Z96642 Presence of left artificial hip joint: Secondary | ICD-10-CM | POA: Diagnosis not present

## 2020-11-10 DIAGNOSIS — I890 Lymphedema, not elsewhere classified: Secondary | ICD-10-CM | POA: Diagnosis not present

## 2020-11-10 DIAGNOSIS — D500 Iron deficiency anemia secondary to blood loss (chronic): Secondary | ICD-10-CM | POA: Diagnosis not present

## 2020-11-10 DIAGNOSIS — Z792 Long term (current) use of antibiotics: Secondary | ICD-10-CM | POA: Diagnosis not present

## 2020-11-10 DIAGNOSIS — F419 Anxiety disorder, unspecified: Secondary | ICD-10-CM | POA: Diagnosis not present

## 2020-11-10 DIAGNOSIS — F321 Major depressive disorder, single episode, moderate: Secondary | ICD-10-CM | POA: Diagnosis not present

## 2020-11-10 DIAGNOSIS — I10 Essential (primary) hypertension: Secondary | ICD-10-CM | POA: Diagnosis not present

## 2020-11-10 DIAGNOSIS — I739 Peripheral vascular disease, unspecified: Secondary | ICD-10-CM | POA: Diagnosis not present

## 2020-11-10 DIAGNOSIS — B964 Proteus (mirabilis) (morganii) as the cause of diseases classified elsewhere: Secondary | ICD-10-CM | POA: Diagnosis not present

## 2020-11-10 DIAGNOSIS — Z4800 Encounter for change or removal of nonsurgical wound dressing: Secondary | ICD-10-CM | POA: Diagnosis not present

## 2020-11-11 DIAGNOSIS — I83023 Varicose veins of left lower extremity with ulcer of ankle: Secondary | ICD-10-CM | POA: Diagnosis not present

## 2020-11-11 DIAGNOSIS — L97329 Non-pressure chronic ulcer of left ankle with unspecified severity: Secondary | ICD-10-CM | POA: Diagnosis not present

## 2020-11-11 DIAGNOSIS — L03116 Cellulitis of left lower limb: Secondary | ICD-10-CM | POA: Diagnosis not present

## 2020-11-11 DIAGNOSIS — I890 Lymphedema, not elsewhere classified: Secondary | ICD-10-CM | POA: Diagnosis not present

## 2020-11-11 DIAGNOSIS — L97922 Non-pressure chronic ulcer of unspecified part of left lower leg with fat layer exposed: Secondary | ICD-10-CM | POA: Diagnosis not present

## 2020-11-11 DIAGNOSIS — S81801A Unspecified open wound, right lower leg, initial encounter: Secondary | ICD-10-CM | POA: Diagnosis not present

## 2020-11-14 DIAGNOSIS — L97329 Non-pressure chronic ulcer of left ankle with unspecified severity: Secondary | ICD-10-CM | POA: Diagnosis not present

## 2020-11-14 DIAGNOSIS — S81801A Unspecified open wound, right lower leg, initial encounter: Secondary | ICD-10-CM | POA: Diagnosis not present

## 2020-11-14 DIAGNOSIS — L97922 Non-pressure chronic ulcer of unspecified part of left lower leg with fat layer exposed: Secondary | ICD-10-CM | POA: Diagnosis not present

## 2020-11-14 DIAGNOSIS — I890 Lymphedema, not elsewhere classified: Secondary | ICD-10-CM | POA: Diagnosis not present

## 2020-11-14 DIAGNOSIS — L03116 Cellulitis of left lower limb: Secondary | ICD-10-CM | POA: Diagnosis not present

## 2020-11-14 DIAGNOSIS — I83023 Varicose veins of left lower extremity with ulcer of ankle: Secondary | ICD-10-CM | POA: Diagnosis not present

## 2020-11-15 DIAGNOSIS — I129 Hypertensive chronic kidney disease with stage 1 through stage 4 chronic kidney disease, or unspecified chronic kidney disease: Secondary | ICD-10-CM | POA: Diagnosis not present

## 2020-11-15 DIAGNOSIS — N1831 Chronic kidney disease, stage 3a: Secondary | ICD-10-CM | POA: Diagnosis not present

## 2020-11-15 DIAGNOSIS — E7849 Other hyperlipidemia: Secondary | ICD-10-CM | POA: Diagnosis not present

## 2020-11-20 DIAGNOSIS — I700 Atherosclerosis of aorta: Secondary | ICD-10-CM | POA: Diagnosis not present

## 2020-11-20 DIAGNOSIS — L97329 Non-pressure chronic ulcer of left ankle with unspecified severity: Secondary | ICD-10-CM | POA: Diagnosis not present

## 2020-11-20 DIAGNOSIS — R600 Localized edema: Secondary | ICD-10-CM | POA: Diagnosis not present

## 2020-11-20 DIAGNOSIS — L97222 Non-pressure chronic ulcer of left calf with fat layer exposed: Secondary | ICD-10-CM | POA: Diagnosis not present

## 2020-11-20 DIAGNOSIS — L97922 Non-pressure chronic ulcer of unspecified part of left lower leg with fat layer exposed: Secondary | ICD-10-CM | POA: Diagnosis not present

## 2020-11-20 DIAGNOSIS — Z7982 Long term (current) use of aspirin: Secondary | ICD-10-CM | POA: Diagnosis not present

## 2020-11-20 DIAGNOSIS — I70203 Unspecified atherosclerosis of native arteries of extremities, bilateral legs: Secondary | ICD-10-CM | POA: Diagnosis not present

## 2020-11-20 DIAGNOSIS — Z96643 Presence of artificial hip joint, bilateral: Secondary | ICD-10-CM | POA: Diagnosis not present

## 2020-11-20 DIAGNOSIS — K4090 Unilateral inguinal hernia, without obstruction or gangrene, not specified as recurrent: Secondary | ICD-10-CM | POA: Diagnosis not present

## 2020-11-20 DIAGNOSIS — Z87891 Personal history of nicotine dependence: Secondary | ICD-10-CM | POA: Diagnosis not present

## 2020-11-20 DIAGNOSIS — Z7983 Long term (current) use of bisphosphonates: Secondary | ICD-10-CM | POA: Diagnosis not present

## 2020-11-20 DIAGNOSIS — L03116 Cellulitis of left lower limb: Secondary | ICD-10-CM | POA: Diagnosis not present

## 2020-11-20 DIAGNOSIS — Z9049 Acquired absence of other specified parts of digestive tract: Secondary | ICD-10-CM | POA: Diagnosis not present

## 2020-11-20 DIAGNOSIS — J449 Chronic obstructive pulmonary disease, unspecified: Secondary | ICD-10-CM | POA: Diagnosis not present

## 2020-11-20 DIAGNOSIS — Z79899 Other long term (current) drug therapy: Secondary | ICD-10-CM | POA: Diagnosis not present

## 2020-11-20 DIAGNOSIS — I701 Atherosclerosis of renal artery: Secondary | ICD-10-CM | POA: Diagnosis not present

## 2020-11-20 DIAGNOSIS — Z90710 Acquired absence of both cervix and uterus: Secondary | ICD-10-CM | POA: Diagnosis not present

## 2020-11-20 DIAGNOSIS — M5136 Other intervertebral disc degeneration, lumbar region: Secondary | ICD-10-CM | POA: Diagnosis not present

## 2020-11-20 DIAGNOSIS — K838 Other specified diseases of biliary tract: Secondary | ICD-10-CM | POA: Diagnosis not present

## 2020-11-20 DIAGNOSIS — S81801A Unspecified open wound, right lower leg, initial encounter: Secondary | ICD-10-CM | POA: Diagnosis not present

## 2020-11-20 DIAGNOSIS — I890 Lymphedema, not elsewhere classified: Secondary | ICD-10-CM | POA: Diagnosis not present

## 2020-11-20 DIAGNOSIS — I872 Venous insufficiency (chronic) (peripheral): Secondary | ICD-10-CM | POA: Diagnosis not present

## 2020-11-20 DIAGNOSIS — I10 Essential (primary) hypertension: Secondary | ICD-10-CM | POA: Diagnosis not present

## 2020-11-20 DIAGNOSIS — M4316 Spondylolisthesis, lumbar region: Secondary | ICD-10-CM | POA: Diagnosis not present

## 2020-11-20 DIAGNOSIS — I83023 Varicose veins of left lower extremity with ulcer of ankle: Secondary | ICD-10-CM | POA: Diagnosis not present

## 2020-11-21 DIAGNOSIS — D649 Anemia, unspecified: Secondary | ICD-10-CM | POA: Diagnosis not present

## 2020-11-21 DIAGNOSIS — L280 Lichen simplex chronicus: Secondary | ICD-10-CM | POA: Diagnosis not present

## 2020-11-21 DIAGNOSIS — F331 Major depressive disorder, recurrent, moderate: Secondary | ICD-10-CM | POA: Diagnosis not present

## 2020-11-21 DIAGNOSIS — L03116 Cellulitis of left lower limb: Secondary | ICD-10-CM | POA: Diagnosis not present

## 2020-11-22 DIAGNOSIS — B950 Streptococcus, group A, as the cause of diseases classified elsewhere: Secondary | ICD-10-CM | POA: Diagnosis not present

## 2020-11-22 DIAGNOSIS — L03116 Cellulitis of left lower limb: Secondary | ICD-10-CM | POA: Diagnosis not present

## 2020-11-22 DIAGNOSIS — B964 Proteus (mirabilis) (morganii) as the cause of diseases classified elsewhere: Secondary | ICD-10-CM | POA: Diagnosis not present

## 2020-11-22 DIAGNOSIS — I872 Venous insufficiency (chronic) (peripheral): Secondary | ICD-10-CM | POA: Diagnosis not present

## 2020-11-22 DIAGNOSIS — L97322 Non-pressure chronic ulcer of left ankle with fat layer exposed: Secondary | ICD-10-CM | POA: Diagnosis not present

## 2020-11-22 DIAGNOSIS — S81801D Unspecified open wound, right lower leg, subsequent encounter: Secondary | ICD-10-CM | POA: Diagnosis not present

## 2020-11-26 DIAGNOSIS — B950 Streptococcus, group A, as the cause of diseases classified elsewhere: Secondary | ICD-10-CM | POA: Diagnosis not present

## 2020-11-26 DIAGNOSIS — I872 Venous insufficiency (chronic) (peripheral): Secondary | ICD-10-CM | POA: Diagnosis not present

## 2020-11-26 DIAGNOSIS — L03116 Cellulitis of left lower limb: Secondary | ICD-10-CM | POA: Diagnosis not present

## 2020-11-26 DIAGNOSIS — L97322 Non-pressure chronic ulcer of left ankle with fat layer exposed: Secondary | ICD-10-CM | POA: Diagnosis not present

## 2020-11-26 DIAGNOSIS — S81801D Unspecified open wound, right lower leg, subsequent encounter: Secondary | ICD-10-CM | POA: Diagnosis not present

## 2020-11-26 DIAGNOSIS — B964 Proteus (mirabilis) (morganii) as the cause of diseases classified elsewhere: Secondary | ICD-10-CM | POA: Diagnosis not present

## 2020-11-29 DIAGNOSIS — S81801D Unspecified open wound, right lower leg, subsequent encounter: Secondary | ICD-10-CM | POA: Diagnosis not present

## 2020-11-29 DIAGNOSIS — B950 Streptococcus, group A, as the cause of diseases classified elsewhere: Secondary | ICD-10-CM | POA: Diagnosis not present

## 2020-11-29 DIAGNOSIS — L97322 Non-pressure chronic ulcer of left ankle with fat layer exposed: Secondary | ICD-10-CM | POA: Diagnosis not present

## 2020-11-29 DIAGNOSIS — I872 Venous insufficiency (chronic) (peripheral): Secondary | ICD-10-CM | POA: Diagnosis not present

## 2020-11-29 DIAGNOSIS — B964 Proteus (mirabilis) (morganii) as the cause of diseases classified elsewhere: Secondary | ICD-10-CM | POA: Diagnosis not present

## 2020-11-29 DIAGNOSIS — L03116 Cellulitis of left lower limb: Secondary | ICD-10-CM | POA: Diagnosis not present

## 2020-12-03 DIAGNOSIS — I872 Venous insufficiency (chronic) (peripheral): Secondary | ICD-10-CM | POA: Diagnosis not present

## 2020-12-03 DIAGNOSIS — L03116 Cellulitis of left lower limb: Secondary | ICD-10-CM | POA: Diagnosis not present

## 2020-12-03 DIAGNOSIS — B964 Proteus (mirabilis) (morganii) as the cause of diseases classified elsewhere: Secondary | ICD-10-CM | POA: Diagnosis not present

## 2020-12-03 DIAGNOSIS — B950 Streptococcus, group A, as the cause of diseases classified elsewhere: Secondary | ICD-10-CM | POA: Diagnosis not present

## 2020-12-03 DIAGNOSIS — L97322 Non-pressure chronic ulcer of left ankle with fat layer exposed: Secondary | ICD-10-CM | POA: Diagnosis not present

## 2020-12-03 DIAGNOSIS — S81801D Unspecified open wound, right lower leg, subsequent encounter: Secondary | ICD-10-CM | POA: Diagnosis not present

## 2020-12-04 DIAGNOSIS — E7801 Familial hypercholesterolemia: Secondary | ICD-10-CM | POA: Diagnosis not present

## 2020-12-04 DIAGNOSIS — E7849 Other hyperlipidemia: Secondary | ICD-10-CM | POA: Diagnosis not present

## 2020-12-04 DIAGNOSIS — E7800 Pure hypercholesterolemia, unspecified: Secondary | ICD-10-CM | POA: Diagnosis not present

## 2020-12-04 DIAGNOSIS — D519 Vitamin B12 deficiency anemia, unspecified: Secondary | ICD-10-CM | POA: Diagnosis not present

## 2020-12-04 DIAGNOSIS — D529 Folate deficiency anemia, unspecified: Secondary | ICD-10-CM | POA: Diagnosis not present

## 2020-12-04 DIAGNOSIS — R7301 Impaired fasting glucose: Secondary | ICD-10-CM | POA: Diagnosis not present

## 2020-12-04 DIAGNOSIS — E875 Hyperkalemia: Secondary | ICD-10-CM | POA: Diagnosis not present

## 2020-12-04 DIAGNOSIS — D649 Anemia, unspecified: Secondary | ICD-10-CM | POA: Diagnosis not present

## 2020-12-04 DIAGNOSIS — E782 Mixed hyperlipidemia: Secondary | ICD-10-CM | POA: Diagnosis not present

## 2020-12-06 DIAGNOSIS — I872 Venous insufficiency (chronic) (peripheral): Secondary | ICD-10-CM | POA: Diagnosis not present

## 2020-12-06 DIAGNOSIS — B950 Streptococcus, group A, as the cause of diseases classified elsewhere: Secondary | ICD-10-CM | POA: Diagnosis not present

## 2020-12-06 DIAGNOSIS — L03116 Cellulitis of left lower limb: Secondary | ICD-10-CM | POA: Diagnosis not present

## 2020-12-06 DIAGNOSIS — B964 Proteus (mirabilis) (morganii) as the cause of diseases classified elsewhere: Secondary | ICD-10-CM | POA: Diagnosis not present

## 2020-12-06 DIAGNOSIS — L97322 Non-pressure chronic ulcer of left ankle with fat layer exposed: Secondary | ICD-10-CM | POA: Diagnosis not present

## 2020-12-06 DIAGNOSIS — S81801D Unspecified open wound, right lower leg, subsequent encounter: Secondary | ICD-10-CM | POA: Diagnosis not present

## 2020-12-09 DIAGNOSIS — M79674 Pain in right toe(s): Secondary | ICD-10-CM | POA: Diagnosis not present

## 2020-12-09 DIAGNOSIS — B964 Proteus (mirabilis) (morganii) as the cause of diseases classified elsewhere: Secondary | ICD-10-CM | POA: Diagnosis not present

## 2020-12-09 DIAGNOSIS — L97322 Non-pressure chronic ulcer of left ankle with fat layer exposed: Secondary | ICD-10-CM | POA: Diagnosis not present

## 2020-12-09 DIAGNOSIS — B950 Streptococcus, group A, as the cause of diseases classified elsewhere: Secondary | ICD-10-CM | POA: Diagnosis not present

## 2020-12-09 DIAGNOSIS — B351 Tinea unguium: Secondary | ICD-10-CM | POA: Diagnosis not present

## 2020-12-09 DIAGNOSIS — M79675 Pain in left toe(s): Secondary | ICD-10-CM | POA: Diagnosis not present

## 2020-12-09 DIAGNOSIS — I872 Venous insufficiency (chronic) (peripheral): Secondary | ICD-10-CM | POA: Diagnosis not present

## 2020-12-09 DIAGNOSIS — I739 Peripheral vascular disease, unspecified: Secondary | ICD-10-CM | POA: Diagnosis not present

## 2020-12-09 DIAGNOSIS — S81801D Unspecified open wound, right lower leg, subsequent encounter: Secondary | ICD-10-CM | POA: Diagnosis not present

## 2020-12-09 DIAGNOSIS — L03116 Cellulitis of left lower limb: Secondary | ICD-10-CM | POA: Diagnosis not present

## 2020-12-10 DIAGNOSIS — I890 Lymphedema, not elsewhere classified: Secondary | ICD-10-CM | POA: Diagnosis not present

## 2020-12-10 DIAGNOSIS — N814 Uterovaginal prolapse, unspecified: Secondary | ICD-10-CM | POA: Diagnosis not present

## 2020-12-10 DIAGNOSIS — Z792 Long term (current) use of antibiotics: Secondary | ICD-10-CM | POA: Diagnosis not present

## 2020-12-10 DIAGNOSIS — B964 Proteus (mirabilis) (morganii) as the cause of diseases classified elsewhere: Secondary | ICD-10-CM | POA: Diagnosis not present

## 2020-12-10 DIAGNOSIS — I739 Peripheral vascular disease, unspecified: Secondary | ICD-10-CM | POA: Diagnosis not present

## 2020-12-10 DIAGNOSIS — J449 Chronic obstructive pulmonary disease, unspecified: Secondary | ICD-10-CM | POA: Diagnosis not present

## 2020-12-10 DIAGNOSIS — R4582 Worries: Secondary | ICD-10-CM | POA: Diagnosis not present

## 2020-12-10 DIAGNOSIS — E7849 Other hyperlipidemia: Secondary | ICD-10-CM | POA: Diagnosis not present

## 2020-12-10 DIAGNOSIS — I872 Venous insufficiency (chronic) (peripheral): Secondary | ICD-10-CM | POA: Diagnosis not present

## 2020-12-10 DIAGNOSIS — D500 Iron deficiency anemia secondary to blood loss (chronic): Secondary | ICD-10-CM | POA: Diagnosis not present

## 2020-12-10 DIAGNOSIS — B950 Streptococcus, group A, as the cause of diseases classified elsewhere: Secondary | ICD-10-CM | POA: Diagnosis not present

## 2020-12-10 DIAGNOSIS — F419 Anxiety disorder, unspecified: Secondary | ICD-10-CM | POA: Diagnosis not present

## 2020-12-10 DIAGNOSIS — Z7189 Other specified counseling: Secondary | ICD-10-CM | POA: Diagnosis not present

## 2020-12-10 DIAGNOSIS — L03116 Cellulitis of left lower limb: Secondary | ICD-10-CM | POA: Diagnosis not present

## 2020-12-10 DIAGNOSIS — F321 Major depressive disorder, single episode, moderate: Secondary | ICD-10-CM | POA: Diagnosis not present

## 2020-12-10 DIAGNOSIS — Z96642 Presence of left artificial hip joint: Secondary | ICD-10-CM | POA: Diagnosis not present

## 2020-12-10 DIAGNOSIS — N1831 Chronic kidney disease, stage 3a: Secondary | ICD-10-CM | POA: Diagnosis not present

## 2020-12-10 DIAGNOSIS — Z4800 Encounter for change or removal of nonsurgical wound dressing: Secondary | ICD-10-CM | POA: Diagnosis not present

## 2020-12-10 DIAGNOSIS — Z9181 History of falling: Secondary | ICD-10-CM | POA: Diagnosis not present

## 2020-12-10 DIAGNOSIS — I10 Essential (primary) hypertension: Secondary | ICD-10-CM | POA: Diagnosis not present

## 2020-12-10 DIAGNOSIS — M810 Age-related osteoporosis without current pathological fracture: Secondary | ICD-10-CM | POA: Diagnosis not present

## 2020-12-10 DIAGNOSIS — L97322 Non-pressure chronic ulcer of left ankle with fat layer exposed: Secondary | ICD-10-CM | POA: Diagnosis not present

## 2020-12-10 DIAGNOSIS — F17211 Nicotine dependence, cigarettes, in remission: Secondary | ICD-10-CM | POA: Diagnosis not present

## 2020-12-11 DIAGNOSIS — I70203 Unspecified atherosclerosis of native arteries of extremities, bilateral legs: Secondary | ICD-10-CM | POA: Diagnosis not present

## 2020-12-11 DIAGNOSIS — Z7983 Long term (current) use of bisphosphonates: Secondary | ICD-10-CM | POA: Diagnosis not present

## 2020-12-11 DIAGNOSIS — L97822 Non-pressure chronic ulcer of other part of left lower leg with fat layer exposed: Secondary | ICD-10-CM | POA: Diagnosis not present

## 2020-12-11 DIAGNOSIS — J449 Chronic obstructive pulmonary disease, unspecified: Secondary | ICD-10-CM | POA: Diagnosis not present

## 2020-12-11 DIAGNOSIS — L97222 Non-pressure chronic ulcer of left calf with fat layer exposed: Secondary | ICD-10-CM | POA: Diagnosis not present

## 2020-12-11 DIAGNOSIS — I872 Venous insufficiency (chronic) (peripheral): Secondary | ICD-10-CM | POA: Diagnosis not present

## 2020-12-11 DIAGNOSIS — Z9049 Acquired absence of other specified parts of digestive tract: Secondary | ICD-10-CM | POA: Diagnosis not present

## 2020-12-11 DIAGNOSIS — L03116 Cellulitis of left lower limb: Secondary | ICD-10-CM | POA: Diagnosis not present

## 2020-12-11 DIAGNOSIS — Z79899 Other long term (current) drug therapy: Secondary | ICD-10-CM | POA: Diagnosis not present

## 2020-12-11 DIAGNOSIS — M5136 Other intervertebral disc degeneration, lumbar region: Secondary | ICD-10-CM | POA: Diagnosis not present

## 2020-12-11 DIAGNOSIS — Z96643 Presence of artificial hip joint, bilateral: Secondary | ICD-10-CM | POA: Diagnosis not present

## 2020-12-11 DIAGNOSIS — I83028 Varicose veins of left lower extremity with ulcer other part of lower leg: Secondary | ICD-10-CM | POA: Diagnosis not present

## 2020-12-11 DIAGNOSIS — I700 Atherosclerosis of aorta: Secondary | ICD-10-CM | POA: Diagnosis not present

## 2020-12-11 DIAGNOSIS — M4316 Spondylolisthesis, lumbar region: Secondary | ICD-10-CM | POA: Diagnosis not present

## 2020-12-11 DIAGNOSIS — L97819 Non-pressure chronic ulcer of other part of right lower leg with unspecified severity: Secondary | ICD-10-CM | POA: Diagnosis not present

## 2020-12-11 DIAGNOSIS — I10 Essential (primary) hypertension: Secondary | ICD-10-CM | POA: Diagnosis not present

## 2020-12-11 DIAGNOSIS — L97322 Non-pressure chronic ulcer of left ankle with fat layer exposed: Secondary | ICD-10-CM | POA: Diagnosis not present

## 2020-12-11 DIAGNOSIS — R600 Localized edema: Secondary | ICD-10-CM | POA: Diagnosis not present

## 2020-12-11 DIAGNOSIS — L97329 Non-pressure chronic ulcer of left ankle with unspecified severity: Secondary | ICD-10-CM | POA: Diagnosis not present

## 2020-12-11 DIAGNOSIS — K4090 Unilateral inguinal hernia, without obstruction or gangrene, not specified as recurrent: Secondary | ICD-10-CM | POA: Diagnosis not present

## 2020-12-11 DIAGNOSIS — K838 Other specified diseases of biliary tract: Secondary | ICD-10-CM | POA: Diagnosis not present

## 2020-12-11 DIAGNOSIS — I890 Lymphedema, not elsewhere classified: Secondary | ICD-10-CM | POA: Diagnosis not present

## 2020-12-11 DIAGNOSIS — I83023 Varicose veins of left lower extremity with ulcer of ankle: Secondary | ICD-10-CM | POA: Diagnosis not present

## 2020-12-11 DIAGNOSIS — I83018 Varicose veins of right lower extremity with ulcer other part of lower leg: Secondary | ICD-10-CM | POA: Diagnosis not present

## 2020-12-11 DIAGNOSIS — Z7982 Long term (current) use of aspirin: Secondary | ICD-10-CM | POA: Diagnosis not present

## 2020-12-11 DIAGNOSIS — I701 Atherosclerosis of renal artery: Secondary | ICD-10-CM | POA: Diagnosis not present

## 2020-12-11 DIAGNOSIS — Z87891 Personal history of nicotine dependence: Secondary | ICD-10-CM | POA: Diagnosis not present

## 2020-12-12 DIAGNOSIS — Z23 Encounter for immunization: Secondary | ICD-10-CM | POA: Diagnosis not present

## 2020-12-13 DIAGNOSIS — L97329 Non-pressure chronic ulcer of left ankle with unspecified severity: Secondary | ICD-10-CM | POA: Diagnosis not present

## 2020-12-13 DIAGNOSIS — L03116 Cellulitis of left lower limb: Secondary | ICD-10-CM | POA: Diagnosis not present

## 2020-12-13 DIAGNOSIS — I890 Lymphedema, not elsewhere classified: Secondary | ICD-10-CM | POA: Diagnosis not present

## 2020-12-13 DIAGNOSIS — I83023 Varicose veins of left lower extremity with ulcer of ankle: Secondary | ICD-10-CM | POA: Diagnosis not present

## 2020-12-13 DIAGNOSIS — I70203 Unspecified atherosclerosis of native arteries of extremities, bilateral legs: Secondary | ICD-10-CM | POA: Diagnosis not present

## 2020-12-13 DIAGNOSIS — L97819 Non-pressure chronic ulcer of other part of right lower leg with unspecified severity: Secondary | ICD-10-CM | POA: Diagnosis not present

## 2020-12-16 DIAGNOSIS — B964 Proteus (mirabilis) (morganii) as the cause of diseases classified elsewhere: Secondary | ICD-10-CM | POA: Diagnosis not present

## 2020-12-16 DIAGNOSIS — B950 Streptococcus, group A, as the cause of diseases classified elsewhere: Secondary | ICD-10-CM | POA: Diagnosis not present

## 2020-12-16 DIAGNOSIS — D500 Iron deficiency anemia secondary to blood loss (chronic): Secondary | ICD-10-CM | POA: Diagnosis not present

## 2020-12-16 DIAGNOSIS — I872 Venous insufficiency (chronic) (peripheral): Secondary | ICD-10-CM | POA: Diagnosis not present

## 2020-12-16 DIAGNOSIS — L03116 Cellulitis of left lower limb: Secondary | ICD-10-CM | POA: Diagnosis not present

## 2020-12-16 DIAGNOSIS — L97322 Non-pressure chronic ulcer of left ankle with fat layer exposed: Secondary | ICD-10-CM | POA: Diagnosis not present

## 2020-12-17 DIAGNOSIS — Z01818 Encounter for other preprocedural examination: Secondary | ICD-10-CM | POA: Diagnosis not present

## 2020-12-19 DIAGNOSIS — L97322 Non-pressure chronic ulcer of left ankle with fat layer exposed: Secondary | ICD-10-CM | POA: Diagnosis not present

## 2020-12-19 DIAGNOSIS — B950 Streptococcus, group A, as the cause of diseases classified elsewhere: Secondary | ICD-10-CM | POA: Diagnosis not present

## 2020-12-19 DIAGNOSIS — I872 Venous insufficiency (chronic) (peripheral): Secondary | ICD-10-CM | POA: Diagnosis not present

## 2020-12-19 DIAGNOSIS — D500 Iron deficiency anemia secondary to blood loss (chronic): Secondary | ICD-10-CM | POA: Diagnosis not present

## 2020-12-19 DIAGNOSIS — L03116 Cellulitis of left lower limb: Secondary | ICD-10-CM | POA: Diagnosis not present

## 2020-12-19 DIAGNOSIS — B964 Proteus (mirabilis) (morganii) as the cause of diseases classified elsewhere: Secondary | ICD-10-CM | POA: Diagnosis not present

## 2020-12-23 DIAGNOSIS — B950 Streptococcus, group A, as the cause of diseases classified elsewhere: Secondary | ICD-10-CM | POA: Diagnosis not present

## 2020-12-23 DIAGNOSIS — I872 Venous insufficiency (chronic) (peripheral): Secondary | ICD-10-CM | POA: Diagnosis not present

## 2020-12-23 DIAGNOSIS — D500 Iron deficiency anemia secondary to blood loss (chronic): Secondary | ICD-10-CM | POA: Diagnosis not present

## 2020-12-23 DIAGNOSIS — B964 Proteus (mirabilis) (morganii) as the cause of diseases classified elsewhere: Secondary | ICD-10-CM | POA: Diagnosis not present

## 2020-12-23 DIAGNOSIS — L97322 Non-pressure chronic ulcer of left ankle with fat layer exposed: Secondary | ICD-10-CM | POA: Diagnosis not present

## 2020-12-23 DIAGNOSIS — L03116 Cellulitis of left lower limb: Secondary | ICD-10-CM | POA: Diagnosis not present

## 2020-12-26 DIAGNOSIS — L97322 Non-pressure chronic ulcer of left ankle with fat layer exposed: Secondary | ICD-10-CM | POA: Diagnosis not present

## 2020-12-26 DIAGNOSIS — B950 Streptococcus, group A, as the cause of diseases classified elsewhere: Secondary | ICD-10-CM | POA: Diagnosis not present

## 2020-12-26 DIAGNOSIS — D500 Iron deficiency anemia secondary to blood loss (chronic): Secondary | ICD-10-CM | POA: Diagnosis not present

## 2020-12-26 DIAGNOSIS — B964 Proteus (mirabilis) (morganii) as the cause of diseases classified elsewhere: Secondary | ICD-10-CM | POA: Diagnosis not present

## 2020-12-26 DIAGNOSIS — L03116 Cellulitis of left lower limb: Secondary | ICD-10-CM | POA: Diagnosis not present

## 2020-12-26 DIAGNOSIS — I872 Venous insufficiency (chronic) (peripheral): Secondary | ICD-10-CM | POA: Diagnosis not present

## 2020-12-30 DIAGNOSIS — B964 Proteus (mirabilis) (morganii) as the cause of diseases classified elsewhere: Secondary | ICD-10-CM | POA: Diagnosis not present

## 2020-12-30 DIAGNOSIS — I872 Venous insufficiency (chronic) (peripheral): Secondary | ICD-10-CM | POA: Diagnosis not present

## 2020-12-30 DIAGNOSIS — L03116 Cellulitis of left lower limb: Secondary | ICD-10-CM | POA: Diagnosis not present

## 2020-12-30 DIAGNOSIS — B950 Streptococcus, group A, as the cause of diseases classified elsewhere: Secondary | ICD-10-CM | POA: Diagnosis not present

## 2020-12-30 DIAGNOSIS — L97322 Non-pressure chronic ulcer of left ankle with fat layer exposed: Secondary | ICD-10-CM | POA: Diagnosis not present

## 2020-12-30 DIAGNOSIS — D500 Iron deficiency anemia secondary to blood loss (chronic): Secondary | ICD-10-CM | POA: Diagnosis not present

## 2021-01-01 DIAGNOSIS — Z87891 Personal history of nicotine dependence: Secondary | ICD-10-CM | POA: Diagnosis not present

## 2021-01-01 DIAGNOSIS — Z96643 Presence of artificial hip joint, bilateral: Secondary | ICD-10-CM | POA: Diagnosis not present

## 2021-01-01 DIAGNOSIS — I700 Atherosclerosis of aorta: Secondary | ICD-10-CM | POA: Diagnosis not present

## 2021-01-01 DIAGNOSIS — M5136 Other intervertebral disc degeneration, lumbar region: Secondary | ICD-10-CM | POA: Diagnosis not present

## 2021-01-01 DIAGNOSIS — I83023 Varicose veins of left lower extremity with ulcer of ankle: Secondary | ICD-10-CM | POA: Diagnosis not present

## 2021-01-01 DIAGNOSIS — I10 Essential (primary) hypertension: Secondary | ICD-10-CM | POA: Diagnosis not present

## 2021-01-01 DIAGNOSIS — L97821 Non-pressure chronic ulcer of other part of left lower leg limited to breakdown of skin: Secondary | ICD-10-CM | POA: Diagnosis not present

## 2021-01-01 DIAGNOSIS — I70203 Unspecified atherosclerosis of native arteries of extremities, bilateral legs: Secondary | ICD-10-CM | POA: Diagnosis not present

## 2021-01-01 DIAGNOSIS — I701 Atherosclerosis of renal artery: Secondary | ICD-10-CM | POA: Diagnosis not present

## 2021-01-01 DIAGNOSIS — L97329 Non-pressure chronic ulcer of left ankle with unspecified severity: Secondary | ICD-10-CM | POA: Diagnosis not present

## 2021-01-01 DIAGNOSIS — Z7982 Long term (current) use of aspirin: Secondary | ICD-10-CM | POA: Diagnosis not present

## 2021-01-01 DIAGNOSIS — I890 Lymphedema, not elsewhere classified: Secondary | ICD-10-CM | POA: Diagnosis not present

## 2021-01-01 DIAGNOSIS — J449 Chronic obstructive pulmonary disease, unspecified: Secondary | ICD-10-CM | POA: Diagnosis not present

## 2021-01-01 DIAGNOSIS — K4090 Unilateral inguinal hernia, without obstruction or gangrene, not specified as recurrent: Secondary | ICD-10-CM | POA: Diagnosis not present

## 2021-01-01 DIAGNOSIS — Z79899 Other long term (current) drug therapy: Secondary | ICD-10-CM | POA: Diagnosis not present

## 2021-01-01 DIAGNOSIS — Z7983 Long term (current) use of bisphosphonates: Secondary | ICD-10-CM | POA: Diagnosis not present

## 2021-01-01 DIAGNOSIS — L97321 Non-pressure chronic ulcer of left ankle limited to breakdown of skin: Secondary | ICD-10-CM | POA: Diagnosis not present

## 2021-01-01 DIAGNOSIS — Z9049 Acquired absence of other specified parts of digestive tract: Secondary | ICD-10-CM | POA: Diagnosis not present

## 2021-01-01 DIAGNOSIS — Z8616 Personal history of COVID-19: Secondary | ICD-10-CM | POA: Diagnosis not present

## 2021-01-01 DIAGNOSIS — M4316 Spondylolisthesis, lumbar region: Secondary | ICD-10-CM | POA: Diagnosis not present

## 2021-01-02 DIAGNOSIS — B964 Proteus (mirabilis) (morganii) as the cause of diseases classified elsewhere: Secondary | ICD-10-CM | POA: Diagnosis not present

## 2021-01-02 DIAGNOSIS — L03116 Cellulitis of left lower limb: Secondary | ICD-10-CM | POA: Diagnosis not present

## 2021-01-02 DIAGNOSIS — D500 Iron deficiency anemia secondary to blood loss (chronic): Secondary | ICD-10-CM | POA: Diagnosis not present

## 2021-01-02 DIAGNOSIS — B950 Streptococcus, group A, as the cause of diseases classified elsewhere: Secondary | ICD-10-CM | POA: Diagnosis not present

## 2021-01-02 DIAGNOSIS — I872 Venous insufficiency (chronic) (peripheral): Secondary | ICD-10-CM | POA: Diagnosis not present

## 2021-01-02 DIAGNOSIS — L97322 Non-pressure chronic ulcer of left ankle with fat layer exposed: Secondary | ICD-10-CM | POA: Diagnosis not present

## 2021-01-09 DIAGNOSIS — L97321 Non-pressure chronic ulcer of left ankle limited to breakdown of skin: Secondary | ICD-10-CM | POA: Diagnosis not present

## 2021-01-09 DIAGNOSIS — J449 Chronic obstructive pulmonary disease, unspecified: Secondary | ICD-10-CM | POA: Diagnosis not present

## 2021-01-09 DIAGNOSIS — F419 Anxiety disorder, unspecified: Secondary | ICD-10-CM | POA: Diagnosis not present

## 2021-01-09 DIAGNOSIS — I10 Essential (primary) hypertension: Secondary | ICD-10-CM | POA: Diagnosis not present

## 2021-01-09 DIAGNOSIS — L97821 Non-pressure chronic ulcer of other part of left lower leg limited to breakdown of skin: Secondary | ICD-10-CM | POA: Diagnosis not present

## 2021-01-09 DIAGNOSIS — I890 Lymphedema, not elsewhere classified: Secondary | ICD-10-CM | POA: Diagnosis not present

## 2021-01-09 DIAGNOSIS — Z9181 History of falling: Secondary | ICD-10-CM | POA: Diagnosis not present

## 2021-01-09 DIAGNOSIS — I872 Venous insufficiency (chronic) (peripheral): Secondary | ICD-10-CM | POA: Diagnosis not present

## 2021-01-13 DIAGNOSIS — I872 Venous insufficiency (chronic) (peripheral): Secondary | ICD-10-CM | POA: Diagnosis not present

## 2021-01-13 DIAGNOSIS — I10 Essential (primary) hypertension: Secondary | ICD-10-CM | POA: Diagnosis not present

## 2021-01-13 DIAGNOSIS — J449 Chronic obstructive pulmonary disease, unspecified: Secondary | ICD-10-CM | POA: Diagnosis not present

## 2021-01-13 DIAGNOSIS — L97821 Non-pressure chronic ulcer of other part of left lower leg limited to breakdown of skin: Secondary | ICD-10-CM | POA: Diagnosis not present

## 2021-01-13 DIAGNOSIS — I890 Lymphedema, not elsewhere classified: Secondary | ICD-10-CM | POA: Diagnosis not present

## 2021-01-13 DIAGNOSIS — L97321 Non-pressure chronic ulcer of left ankle limited to breakdown of skin: Secondary | ICD-10-CM | POA: Diagnosis not present

## 2021-01-14 ENCOUNTER — Other Ambulatory Visit: Payer: Self-pay

## 2021-01-14 DIAGNOSIS — L97329 Non-pressure chronic ulcer of left ankle with unspecified severity: Secondary | ICD-10-CM

## 2021-01-14 DIAGNOSIS — I83023 Varicose veins of left lower extremity with ulcer of ankle: Secondary | ICD-10-CM

## 2021-01-16 DIAGNOSIS — L97321 Non-pressure chronic ulcer of left ankle limited to breakdown of skin: Secondary | ICD-10-CM | POA: Diagnosis not present

## 2021-01-16 DIAGNOSIS — I890 Lymphedema, not elsewhere classified: Secondary | ICD-10-CM | POA: Diagnosis not present

## 2021-01-16 DIAGNOSIS — I10 Essential (primary) hypertension: Secondary | ICD-10-CM | POA: Diagnosis not present

## 2021-01-16 DIAGNOSIS — I872 Venous insufficiency (chronic) (peripheral): Secondary | ICD-10-CM | POA: Diagnosis not present

## 2021-01-16 DIAGNOSIS — J449 Chronic obstructive pulmonary disease, unspecified: Secondary | ICD-10-CM | POA: Diagnosis not present

## 2021-01-16 DIAGNOSIS — L97821 Non-pressure chronic ulcer of other part of left lower leg limited to breakdown of skin: Secondary | ICD-10-CM | POA: Diagnosis not present

## 2021-01-20 DIAGNOSIS — L97321 Non-pressure chronic ulcer of left ankle limited to breakdown of skin: Secondary | ICD-10-CM | POA: Diagnosis not present

## 2021-01-20 DIAGNOSIS — L97821 Non-pressure chronic ulcer of other part of left lower leg limited to breakdown of skin: Secondary | ICD-10-CM | POA: Diagnosis not present

## 2021-01-20 DIAGNOSIS — I10 Essential (primary) hypertension: Secondary | ICD-10-CM | POA: Diagnosis not present

## 2021-01-20 DIAGNOSIS — I872 Venous insufficiency (chronic) (peripheral): Secondary | ICD-10-CM | POA: Diagnosis not present

## 2021-01-20 DIAGNOSIS — I890 Lymphedema, not elsewhere classified: Secondary | ICD-10-CM | POA: Diagnosis not present

## 2021-01-20 DIAGNOSIS — J449 Chronic obstructive pulmonary disease, unspecified: Secondary | ICD-10-CM | POA: Diagnosis not present

## 2021-01-22 ENCOUNTER — Ambulatory Visit (HOSPITAL_COMMUNITY): Payer: Medicare Other

## 2021-01-23 DIAGNOSIS — L97821 Non-pressure chronic ulcer of other part of left lower leg limited to breakdown of skin: Secondary | ICD-10-CM | POA: Diagnosis not present

## 2021-01-23 DIAGNOSIS — J449 Chronic obstructive pulmonary disease, unspecified: Secondary | ICD-10-CM | POA: Diagnosis not present

## 2021-01-23 DIAGNOSIS — I10 Essential (primary) hypertension: Secondary | ICD-10-CM | POA: Diagnosis not present

## 2021-01-23 DIAGNOSIS — I872 Venous insufficiency (chronic) (peripheral): Secondary | ICD-10-CM | POA: Diagnosis not present

## 2021-01-23 DIAGNOSIS — I890 Lymphedema, not elsewhere classified: Secondary | ICD-10-CM | POA: Diagnosis not present

## 2021-01-23 DIAGNOSIS — L97321 Non-pressure chronic ulcer of left ankle limited to breakdown of skin: Secondary | ICD-10-CM | POA: Diagnosis not present

## 2021-01-23 IMAGING — DX DG PORTABLE PELVIS
1 series · 1 of 1 positions shown · non-contrast
Comparison: Fluoroscopic images of same day.

CLINICAL DATA: Status post right total hip replacement.

EXAM:
PORTABLE PELVIS 1-2 VIEWS

[pelvis ap]
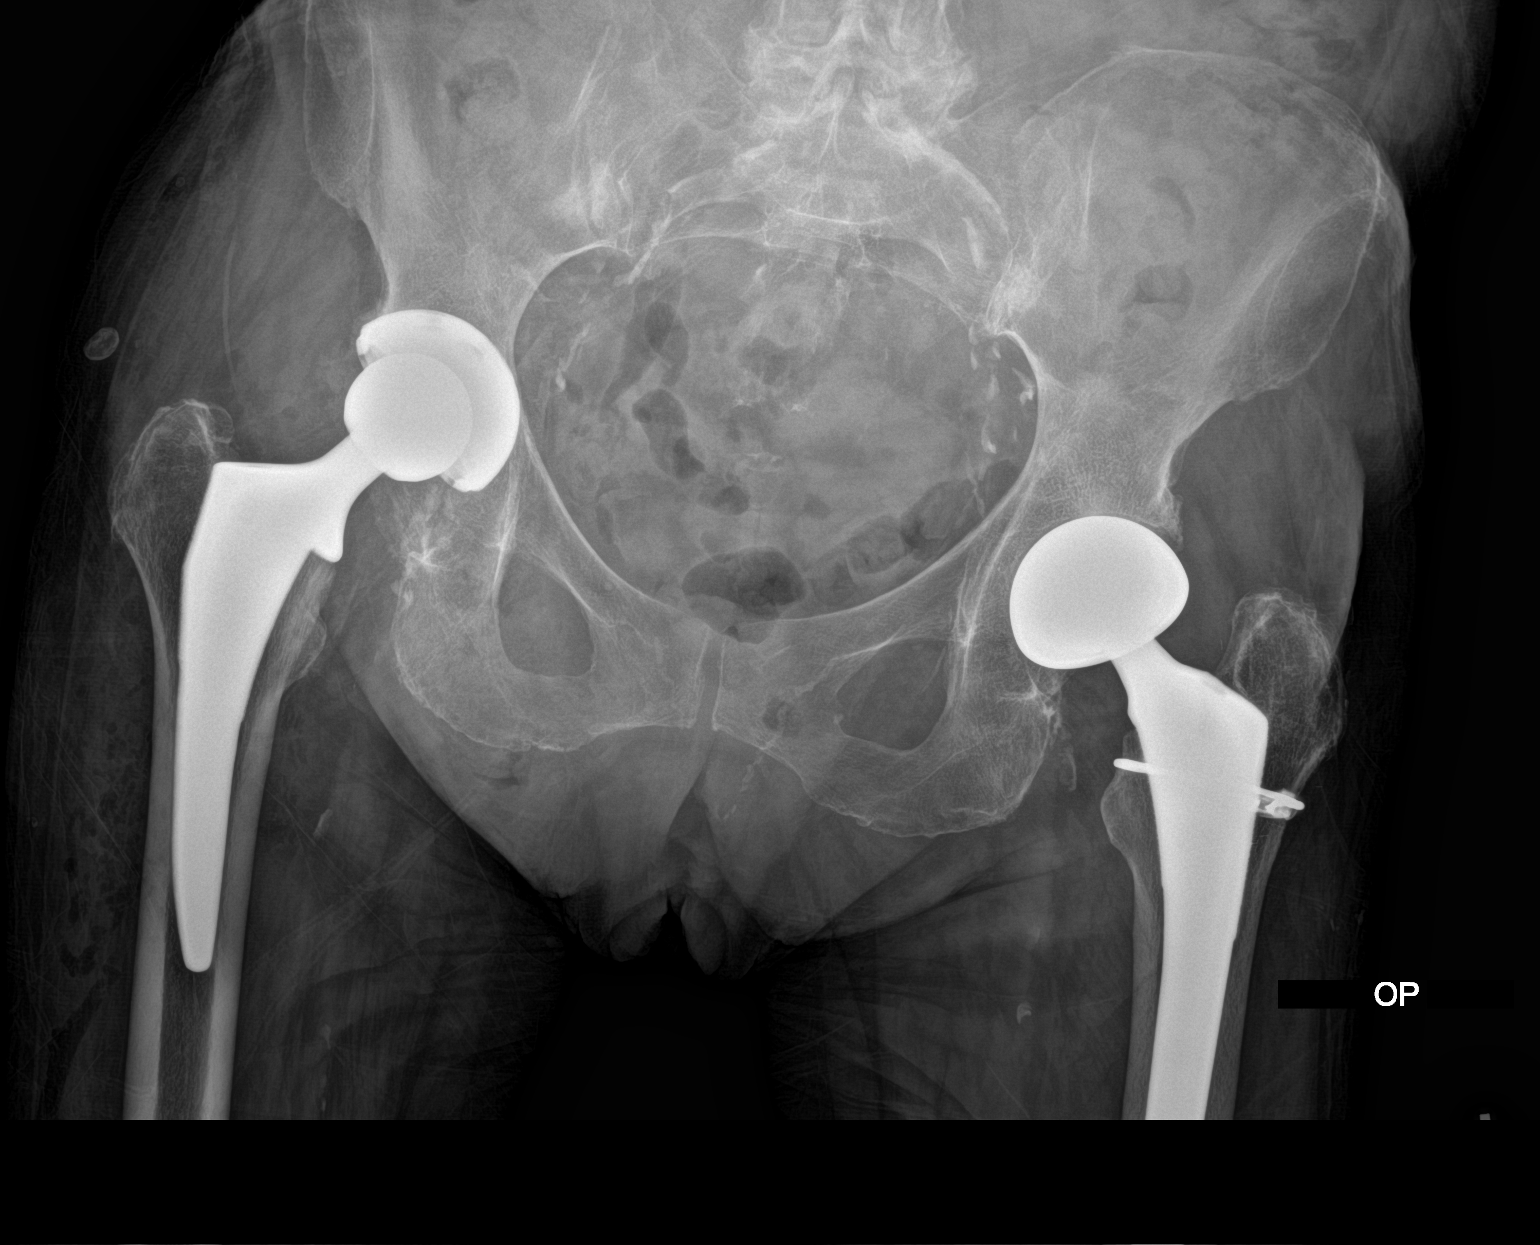

[1 of 1 positions shown; findings below may reference images not displayed]

FINDINGS: The right femoral and acetabular components are well situated.
Expected postoperative changes are noted in the surrounding soft
tissues.
IMPRESSION: Status post right total hip arthroplasty.

## 2021-01-23 IMAGING — RF DG HIP (WITH PELVIS) OPERATIVE*R*
1 series · 4 of 4 positions shown · non-contrast
Comparison: 08/24/2017

FLUOROSCOPY TIME:  0 minutes 6 seconds

Images obtained: 4

Dose: 1.21 mGy

CLINICAL DATA: RIGHT hip replacement

EXAM:
OPERATIVE RIGHT HIP (WITH PELVIS IF PERFORMED) 4 VIEWS
TECHNIQUE: Fluoroscopic spot image(s) were submitted for interpretation
post-operatively.

[Series 1: unknown protocol · 0.20mm/px · 4 of 4 slices shown]
[im 1/4]
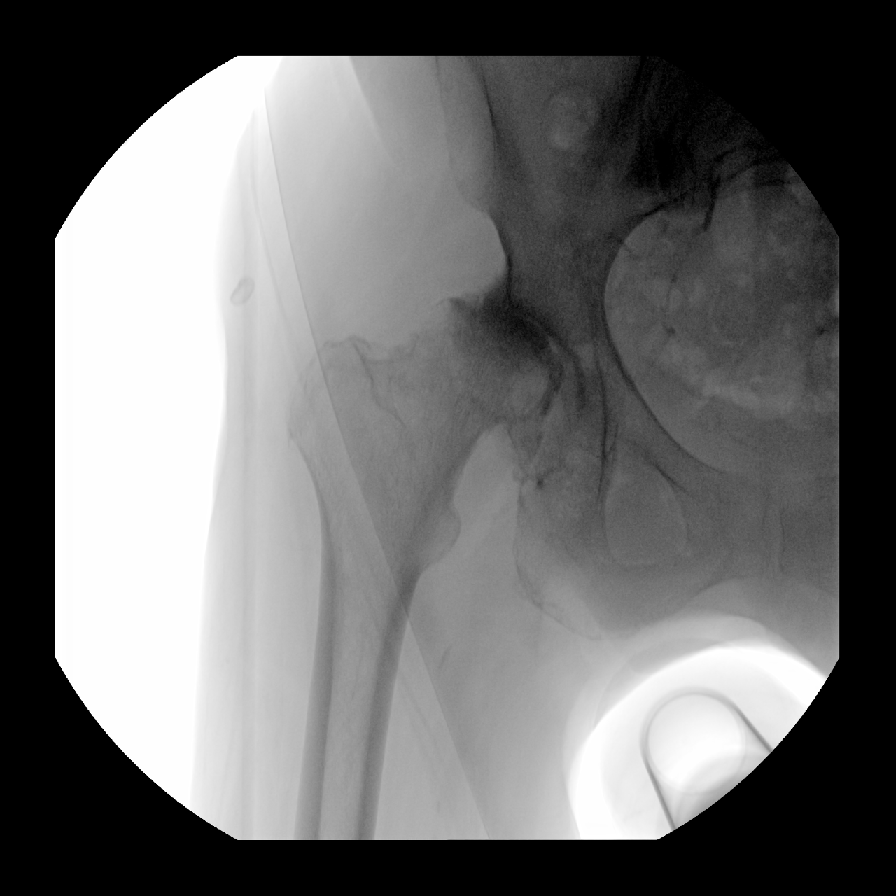
[im 2/4]
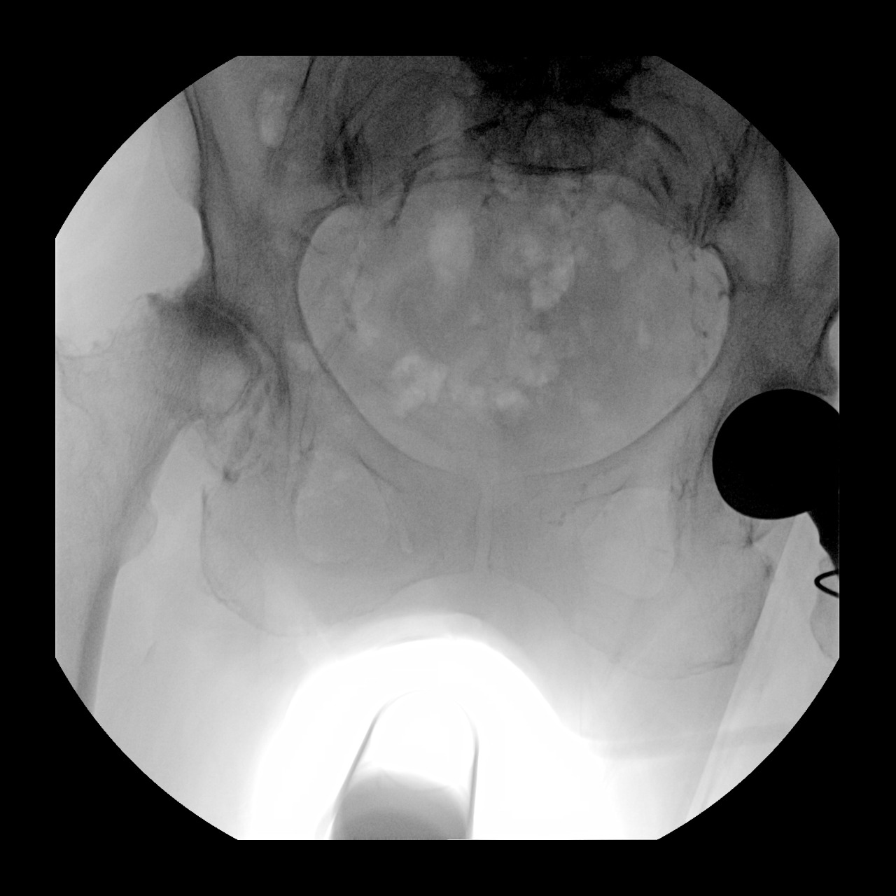
[im 3/4]
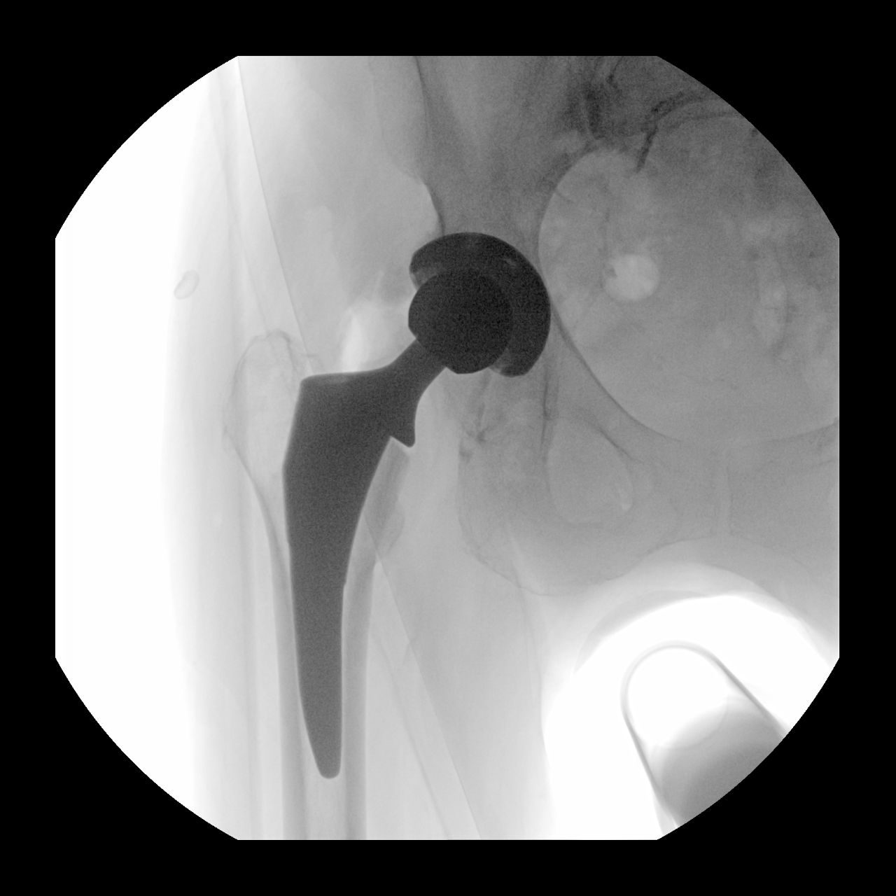
[im 4/4]
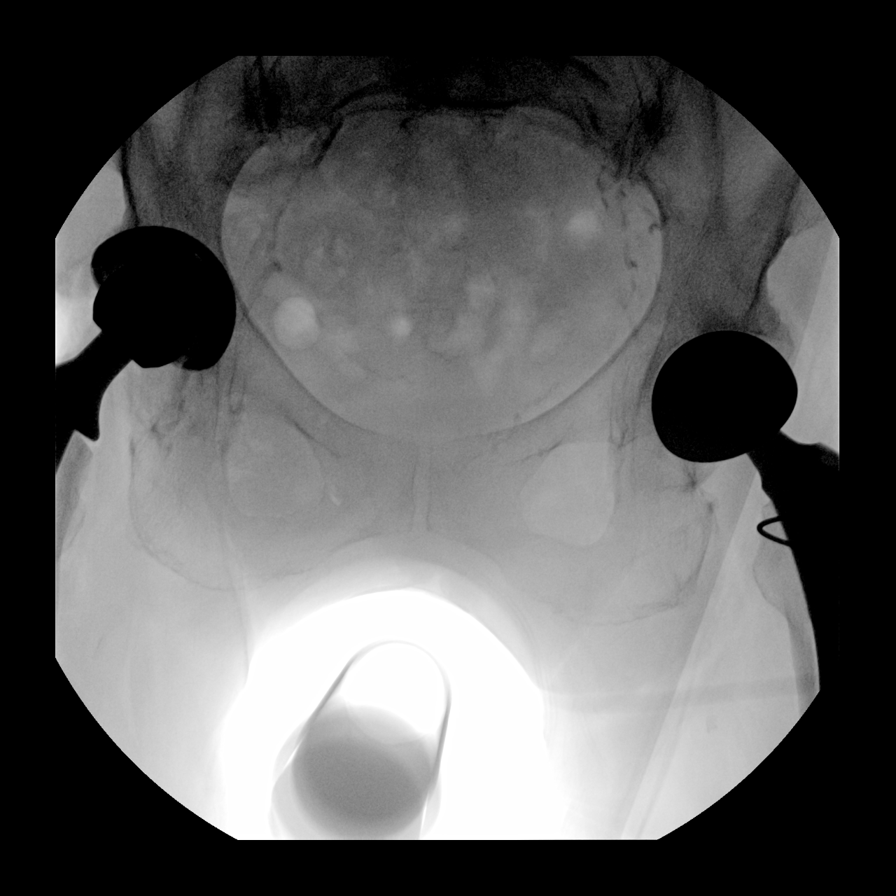

[4 of 4 positions shown; findings below may reference images not displayed]

FINDINGS: Severe osteoarthritic changes of the RIGHT hip on initial image with
superolateral subluxation of the femoral head, bone-on-bone
appearance, spur formation and femoral head deformity.

Subsequent images demonstrate placement of a RIGHT hip prosthesis
without acute fracture or dislocation.

Bones appear demineralized.
IMPRESSION: Advanced osteoarthritis RIGHT hip with underlying osseous
demineralization.

New RIGHT hip prosthesis without acute osseous abnormalities.

## 2021-01-27 DIAGNOSIS — L97321 Non-pressure chronic ulcer of left ankle limited to breakdown of skin: Secondary | ICD-10-CM | POA: Diagnosis not present

## 2021-01-27 DIAGNOSIS — L97821 Non-pressure chronic ulcer of other part of left lower leg limited to breakdown of skin: Secondary | ICD-10-CM | POA: Diagnosis not present

## 2021-01-27 DIAGNOSIS — I890 Lymphedema, not elsewhere classified: Secondary | ICD-10-CM | POA: Diagnosis not present

## 2021-01-27 DIAGNOSIS — I10 Essential (primary) hypertension: Secondary | ICD-10-CM | POA: Diagnosis not present

## 2021-01-27 DIAGNOSIS — I872 Venous insufficiency (chronic) (peripheral): Secondary | ICD-10-CM | POA: Diagnosis not present

## 2021-01-27 DIAGNOSIS — J449 Chronic obstructive pulmonary disease, unspecified: Secondary | ICD-10-CM | POA: Diagnosis not present

## 2021-01-30 DIAGNOSIS — J449 Chronic obstructive pulmonary disease, unspecified: Secondary | ICD-10-CM | POA: Diagnosis not present

## 2021-01-30 DIAGNOSIS — I872 Venous insufficiency (chronic) (peripheral): Secondary | ICD-10-CM | POA: Diagnosis not present

## 2021-01-30 DIAGNOSIS — L97321 Non-pressure chronic ulcer of left ankle limited to breakdown of skin: Secondary | ICD-10-CM | POA: Diagnosis not present

## 2021-01-30 DIAGNOSIS — I10 Essential (primary) hypertension: Secondary | ICD-10-CM | POA: Diagnosis not present

## 2021-01-30 DIAGNOSIS — L97821 Non-pressure chronic ulcer of other part of left lower leg limited to breakdown of skin: Secondary | ICD-10-CM | POA: Diagnosis not present

## 2021-01-30 DIAGNOSIS — I890 Lymphedema, not elsewhere classified: Secondary | ICD-10-CM | POA: Diagnosis not present

## 2021-02-03 DIAGNOSIS — L97821 Non-pressure chronic ulcer of other part of left lower leg limited to breakdown of skin: Secondary | ICD-10-CM | POA: Diagnosis not present

## 2021-02-03 DIAGNOSIS — I890 Lymphedema, not elsewhere classified: Secondary | ICD-10-CM | POA: Diagnosis not present

## 2021-02-03 DIAGNOSIS — I10 Essential (primary) hypertension: Secondary | ICD-10-CM | POA: Diagnosis not present

## 2021-02-03 DIAGNOSIS — J449 Chronic obstructive pulmonary disease, unspecified: Secondary | ICD-10-CM | POA: Diagnosis not present

## 2021-02-03 DIAGNOSIS — I872 Venous insufficiency (chronic) (peripheral): Secondary | ICD-10-CM | POA: Diagnosis not present

## 2021-02-03 DIAGNOSIS — L97321 Non-pressure chronic ulcer of left ankle limited to breakdown of skin: Secondary | ICD-10-CM | POA: Diagnosis not present

## 2021-02-06 DIAGNOSIS — I10 Essential (primary) hypertension: Secondary | ICD-10-CM | POA: Diagnosis not present

## 2021-02-06 DIAGNOSIS — J449 Chronic obstructive pulmonary disease, unspecified: Secondary | ICD-10-CM | POA: Diagnosis not present

## 2021-02-06 DIAGNOSIS — L97321 Non-pressure chronic ulcer of left ankle limited to breakdown of skin: Secondary | ICD-10-CM | POA: Diagnosis not present

## 2021-02-06 DIAGNOSIS — L97821 Non-pressure chronic ulcer of other part of left lower leg limited to breakdown of skin: Secondary | ICD-10-CM | POA: Diagnosis not present

## 2021-02-06 DIAGNOSIS — I872 Venous insufficiency (chronic) (peripheral): Secondary | ICD-10-CM | POA: Diagnosis not present

## 2021-02-06 DIAGNOSIS — I890 Lymphedema, not elsewhere classified: Secondary | ICD-10-CM | POA: Diagnosis not present

## 2021-02-08 DIAGNOSIS — I872 Venous insufficiency (chronic) (peripheral): Secondary | ICD-10-CM | POA: Diagnosis not present

## 2021-02-08 DIAGNOSIS — Z9181 History of falling: Secondary | ICD-10-CM | POA: Diagnosis not present

## 2021-02-08 DIAGNOSIS — L97821 Non-pressure chronic ulcer of other part of left lower leg limited to breakdown of skin: Secondary | ICD-10-CM | POA: Diagnosis not present

## 2021-02-08 DIAGNOSIS — J449 Chronic obstructive pulmonary disease, unspecified: Secondary | ICD-10-CM | POA: Diagnosis not present

## 2021-02-08 DIAGNOSIS — F419 Anxiety disorder, unspecified: Secondary | ICD-10-CM | POA: Diagnosis not present

## 2021-02-08 DIAGNOSIS — I10 Essential (primary) hypertension: Secondary | ICD-10-CM | POA: Diagnosis not present

## 2021-02-08 DIAGNOSIS — L97321 Non-pressure chronic ulcer of left ankle limited to breakdown of skin: Secondary | ICD-10-CM | POA: Diagnosis not present

## 2021-02-08 DIAGNOSIS — I890 Lymphedema, not elsewhere classified: Secondary | ICD-10-CM | POA: Diagnosis not present

## 2021-02-10 DIAGNOSIS — I10 Essential (primary) hypertension: Secondary | ICD-10-CM | POA: Diagnosis not present

## 2021-02-10 DIAGNOSIS — I890 Lymphedema, not elsewhere classified: Secondary | ICD-10-CM | POA: Diagnosis not present

## 2021-02-10 DIAGNOSIS — I872 Venous insufficiency (chronic) (peripheral): Secondary | ICD-10-CM | POA: Diagnosis not present

## 2021-02-10 DIAGNOSIS — L97321 Non-pressure chronic ulcer of left ankle limited to breakdown of skin: Secondary | ICD-10-CM | POA: Diagnosis not present

## 2021-02-10 DIAGNOSIS — L97821 Non-pressure chronic ulcer of other part of left lower leg limited to breakdown of skin: Secondary | ICD-10-CM | POA: Diagnosis not present

## 2021-02-10 DIAGNOSIS — J449 Chronic obstructive pulmonary disease, unspecified: Secondary | ICD-10-CM | POA: Diagnosis not present

## 2021-02-12 DIAGNOSIS — I872 Venous insufficiency (chronic) (peripheral): Secondary | ICD-10-CM | POA: Diagnosis not present

## 2021-02-12 DIAGNOSIS — D649 Anemia, unspecified: Secondary | ICD-10-CM | POA: Diagnosis not present

## 2021-02-12 DIAGNOSIS — Z7982 Long term (current) use of aspirin: Secondary | ICD-10-CM | POA: Diagnosis not present

## 2021-02-12 DIAGNOSIS — L97329 Non-pressure chronic ulcer of left ankle with unspecified severity: Secondary | ICD-10-CM | POA: Diagnosis not present

## 2021-02-12 DIAGNOSIS — I83023 Varicose veins of left lower extremity with ulcer of ankle: Secondary | ICD-10-CM | POA: Diagnosis not present

## 2021-02-12 DIAGNOSIS — Z79899 Other long term (current) drug therapy: Secondary | ICD-10-CM | POA: Diagnosis not present

## 2021-02-12 DIAGNOSIS — J449 Chronic obstructive pulmonary disease, unspecified: Secondary | ICD-10-CM | POA: Diagnosis not present

## 2021-02-12 DIAGNOSIS — Z7983 Long term (current) use of bisphosphonates: Secondary | ICD-10-CM | POA: Diagnosis not present

## 2021-02-12 DIAGNOSIS — Z87891 Personal history of nicotine dependence: Secondary | ICD-10-CM | POA: Diagnosis not present

## 2021-02-12 DIAGNOSIS — I10 Essential (primary) hypertension: Secondary | ICD-10-CM | POA: Diagnosis not present

## 2021-02-12 DIAGNOSIS — L97321 Non-pressure chronic ulcer of left ankle limited to breakdown of skin: Secondary | ICD-10-CM | POA: Diagnosis not present

## 2021-02-12 DIAGNOSIS — K4090 Unilateral inguinal hernia, without obstruction or gangrene, not specified as recurrent: Secondary | ICD-10-CM | POA: Diagnosis not present

## 2021-02-12 DIAGNOSIS — I890 Lymphedema, not elsewhere classified: Secondary | ICD-10-CM | POA: Diagnosis not present

## 2021-02-13 DIAGNOSIS — I890 Lymphedema, not elsewhere classified: Secondary | ICD-10-CM | POA: Diagnosis not present

## 2021-02-13 DIAGNOSIS — I872 Venous insufficiency (chronic) (peripheral): Secondary | ICD-10-CM | POA: Diagnosis not present

## 2021-02-13 DIAGNOSIS — J449 Chronic obstructive pulmonary disease, unspecified: Secondary | ICD-10-CM | POA: Diagnosis not present

## 2021-02-13 DIAGNOSIS — L97321 Non-pressure chronic ulcer of left ankle limited to breakdown of skin: Secondary | ICD-10-CM | POA: Diagnosis not present

## 2021-02-13 DIAGNOSIS — I10 Essential (primary) hypertension: Secondary | ICD-10-CM | POA: Diagnosis not present

## 2021-02-13 DIAGNOSIS — L97821 Non-pressure chronic ulcer of other part of left lower leg limited to breakdown of skin: Secondary | ICD-10-CM | POA: Diagnosis not present

## 2021-02-14 ENCOUNTER — Ambulatory Visit (HOSPITAL_COMMUNITY): Payer: Medicare Other | Attending: Vascular Surgery

## 2021-02-17 DIAGNOSIS — M79675 Pain in left toe(s): Secondary | ICD-10-CM | POA: Diagnosis not present

## 2021-02-17 DIAGNOSIS — I739 Peripheral vascular disease, unspecified: Secondary | ICD-10-CM | POA: Diagnosis not present

## 2021-02-17 DIAGNOSIS — L97821 Non-pressure chronic ulcer of other part of left lower leg limited to breakdown of skin: Secondary | ICD-10-CM | POA: Diagnosis not present

## 2021-02-17 DIAGNOSIS — B351 Tinea unguium: Secondary | ICD-10-CM | POA: Diagnosis not present

## 2021-02-17 DIAGNOSIS — L97321 Non-pressure chronic ulcer of left ankle limited to breakdown of skin: Secondary | ICD-10-CM | POA: Diagnosis not present

## 2021-02-17 DIAGNOSIS — J449 Chronic obstructive pulmonary disease, unspecified: Secondary | ICD-10-CM | POA: Diagnosis not present

## 2021-02-17 DIAGNOSIS — M79674 Pain in right toe(s): Secondary | ICD-10-CM | POA: Diagnosis not present

## 2021-02-17 DIAGNOSIS — I890 Lymphedema, not elsewhere classified: Secondary | ICD-10-CM | POA: Diagnosis not present

## 2021-02-17 DIAGNOSIS — I10 Essential (primary) hypertension: Secondary | ICD-10-CM | POA: Diagnosis not present

## 2021-02-17 DIAGNOSIS — I872 Venous insufficiency (chronic) (peripheral): Secondary | ICD-10-CM | POA: Diagnosis not present

## 2021-02-20 DIAGNOSIS — L97321 Non-pressure chronic ulcer of left ankle limited to breakdown of skin: Secondary | ICD-10-CM | POA: Diagnosis not present

## 2021-02-20 DIAGNOSIS — L97821 Non-pressure chronic ulcer of other part of left lower leg limited to breakdown of skin: Secondary | ICD-10-CM | POA: Diagnosis not present

## 2021-02-20 DIAGNOSIS — I872 Venous insufficiency (chronic) (peripheral): Secondary | ICD-10-CM | POA: Diagnosis not present

## 2021-02-20 DIAGNOSIS — J449 Chronic obstructive pulmonary disease, unspecified: Secondary | ICD-10-CM | POA: Diagnosis not present

## 2021-02-20 DIAGNOSIS — I890 Lymphedema, not elsewhere classified: Secondary | ICD-10-CM | POA: Diagnosis not present

## 2021-02-20 DIAGNOSIS — I10 Essential (primary) hypertension: Secondary | ICD-10-CM | POA: Diagnosis not present

## 2021-02-24 DIAGNOSIS — I890 Lymphedema, not elsewhere classified: Secondary | ICD-10-CM | POA: Diagnosis not present

## 2021-02-24 DIAGNOSIS — J449 Chronic obstructive pulmonary disease, unspecified: Secondary | ICD-10-CM | POA: Diagnosis not present

## 2021-02-24 DIAGNOSIS — L97821 Non-pressure chronic ulcer of other part of left lower leg limited to breakdown of skin: Secondary | ICD-10-CM | POA: Diagnosis not present

## 2021-02-24 DIAGNOSIS — L97321 Non-pressure chronic ulcer of left ankle limited to breakdown of skin: Secondary | ICD-10-CM | POA: Diagnosis not present

## 2021-02-24 DIAGNOSIS — I872 Venous insufficiency (chronic) (peripheral): Secondary | ICD-10-CM | POA: Diagnosis not present

## 2021-02-24 DIAGNOSIS — I10 Essential (primary) hypertension: Secondary | ICD-10-CM | POA: Diagnosis not present

## 2021-02-27 DIAGNOSIS — I890 Lymphedema, not elsewhere classified: Secondary | ICD-10-CM | POA: Diagnosis not present

## 2021-02-27 DIAGNOSIS — L97321 Non-pressure chronic ulcer of left ankle limited to breakdown of skin: Secondary | ICD-10-CM | POA: Diagnosis not present

## 2021-02-27 DIAGNOSIS — L97821 Non-pressure chronic ulcer of other part of left lower leg limited to breakdown of skin: Secondary | ICD-10-CM | POA: Diagnosis not present

## 2021-02-27 DIAGNOSIS — J449 Chronic obstructive pulmonary disease, unspecified: Secondary | ICD-10-CM | POA: Diagnosis not present

## 2021-02-27 DIAGNOSIS — I872 Venous insufficiency (chronic) (peripheral): Secondary | ICD-10-CM | POA: Diagnosis not present

## 2021-02-27 DIAGNOSIS — I10 Essential (primary) hypertension: Secondary | ICD-10-CM | POA: Diagnosis not present

## 2021-03-04 DIAGNOSIS — I890 Lymphedema, not elsewhere classified: Secondary | ICD-10-CM | POA: Diagnosis not present

## 2021-03-04 DIAGNOSIS — L97321 Non-pressure chronic ulcer of left ankle limited to breakdown of skin: Secondary | ICD-10-CM | POA: Diagnosis not present

## 2021-03-04 DIAGNOSIS — I872 Venous insufficiency (chronic) (peripheral): Secondary | ICD-10-CM | POA: Diagnosis not present

## 2021-03-04 DIAGNOSIS — I10 Essential (primary) hypertension: Secondary | ICD-10-CM | POA: Diagnosis not present

## 2021-03-04 DIAGNOSIS — L97821 Non-pressure chronic ulcer of other part of left lower leg limited to breakdown of skin: Secondary | ICD-10-CM | POA: Diagnosis not present

## 2021-03-04 DIAGNOSIS — J449 Chronic obstructive pulmonary disease, unspecified: Secondary | ICD-10-CM | POA: Diagnosis not present

## 2021-03-05 DIAGNOSIS — K4000 Bilateral inguinal hernia, with obstruction, without gangrene, not specified as recurrent: Secondary | ICD-10-CM | POA: Diagnosis not present

## 2021-03-05 DIAGNOSIS — Z01818 Encounter for other preprocedural examination: Secondary | ICD-10-CM | POA: Diagnosis not present

## 2021-03-05 DIAGNOSIS — L97321 Non-pressure chronic ulcer of left ankle limited to breakdown of skin: Secondary | ICD-10-CM | POA: Diagnosis not present

## 2021-03-05 DIAGNOSIS — I10 Essential (primary) hypertension: Secondary | ICD-10-CM | POA: Diagnosis not present

## 2021-03-05 DIAGNOSIS — I83023 Varicose veins of left lower extremity with ulcer of ankle: Secondary | ICD-10-CM | POA: Diagnosis not present

## 2021-03-05 DIAGNOSIS — S91101A Unspecified open wound of right great toe without damage to nail, initial encounter: Secondary | ICD-10-CM | POA: Insufficient documentation

## 2021-03-05 DIAGNOSIS — L97329 Non-pressure chronic ulcer of left ankle with unspecified severity: Secondary | ICD-10-CM | POA: Diagnosis not present

## 2021-03-05 DIAGNOSIS — Z7983 Long term (current) use of bisphosphonates: Secondary | ICD-10-CM | POA: Diagnosis not present

## 2021-03-05 DIAGNOSIS — Z7982 Long term (current) use of aspirin: Secondary | ICD-10-CM | POA: Diagnosis not present

## 2021-03-05 DIAGNOSIS — I872 Venous insufficiency (chronic) (peripheral): Secondary | ICD-10-CM | POA: Diagnosis not present

## 2021-03-05 DIAGNOSIS — K4090 Unilateral inguinal hernia, without obstruction or gangrene, not specified as recurrent: Secondary | ICD-10-CM | POA: Diagnosis not present

## 2021-03-05 DIAGNOSIS — Z87891 Personal history of nicotine dependence: Secondary | ICD-10-CM | POA: Diagnosis not present

## 2021-03-05 DIAGNOSIS — Z79899 Other long term (current) drug therapy: Secondary | ICD-10-CM | POA: Diagnosis not present

## 2021-03-05 DIAGNOSIS — J449 Chronic obstructive pulmonary disease, unspecified: Secondary | ICD-10-CM | POA: Diagnosis not present

## 2021-03-05 DIAGNOSIS — I890 Lymphedema, not elsewhere classified: Secondary | ICD-10-CM | POA: Diagnosis not present

## 2021-03-06 DIAGNOSIS — I890 Lymphedema, not elsewhere classified: Secondary | ICD-10-CM | POA: Diagnosis not present

## 2021-03-06 DIAGNOSIS — I10 Essential (primary) hypertension: Secondary | ICD-10-CM | POA: Diagnosis not present

## 2021-03-06 DIAGNOSIS — J449 Chronic obstructive pulmonary disease, unspecified: Secondary | ICD-10-CM | POA: Diagnosis not present

## 2021-03-06 DIAGNOSIS — L97321 Non-pressure chronic ulcer of left ankle limited to breakdown of skin: Secondary | ICD-10-CM | POA: Diagnosis not present

## 2021-03-06 DIAGNOSIS — L97821 Non-pressure chronic ulcer of other part of left lower leg limited to breakdown of skin: Secondary | ICD-10-CM | POA: Diagnosis not present

## 2021-03-06 DIAGNOSIS — I872 Venous insufficiency (chronic) (peripheral): Secondary | ICD-10-CM | POA: Diagnosis not present

## 2021-03-07 DIAGNOSIS — I872 Venous insufficiency (chronic) (peripheral): Secondary | ICD-10-CM | POA: Diagnosis not present

## 2021-03-07 DIAGNOSIS — I10 Essential (primary) hypertension: Secondary | ICD-10-CM | POA: Diagnosis not present

## 2021-03-07 DIAGNOSIS — I890 Lymphedema, not elsewhere classified: Secondary | ICD-10-CM | POA: Diagnosis not present

## 2021-03-07 DIAGNOSIS — J449 Chronic obstructive pulmonary disease, unspecified: Secondary | ICD-10-CM | POA: Diagnosis not present

## 2021-03-07 DIAGNOSIS — L97821 Non-pressure chronic ulcer of other part of left lower leg limited to breakdown of skin: Secondary | ICD-10-CM | POA: Diagnosis not present

## 2021-03-07 DIAGNOSIS — L97321 Non-pressure chronic ulcer of left ankle limited to breakdown of skin: Secondary | ICD-10-CM | POA: Diagnosis not present

## 2021-03-08 DIAGNOSIS — I872 Venous insufficiency (chronic) (peripheral): Secondary | ICD-10-CM | POA: Diagnosis not present

## 2021-03-08 DIAGNOSIS — L97321 Non-pressure chronic ulcer of left ankle limited to breakdown of skin: Secondary | ICD-10-CM | POA: Diagnosis not present

## 2021-03-08 DIAGNOSIS — I890 Lymphedema, not elsewhere classified: Secondary | ICD-10-CM | POA: Diagnosis not present

## 2021-03-08 DIAGNOSIS — I10 Essential (primary) hypertension: Secondary | ICD-10-CM | POA: Diagnosis not present

## 2021-03-08 DIAGNOSIS — L97821 Non-pressure chronic ulcer of other part of left lower leg limited to breakdown of skin: Secondary | ICD-10-CM | POA: Diagnosis not present

## 2021-03-08 DIAGNOSIS — J449 Chronic obstructive pulmonary disease, unspecified: Secondary | ICD-10-CM | POA: Diagnosis not present

## 2021-03-09 DIAGNOSIS — I10 Essential (primary) hypertension: Secondary | ICD-10-CM | POA: Diagnosis not present

## 2021-03-09 DIAGNOSIS — I890 Lymphedema, not elsewhere classified: Secondary | ICD-10-CM | POA: Diagnosis not present

## 2021-03-09 DIAGNOSIS — J449 Chronic obstructive pulmonary disease, unspecified: Secondary | ICD-10-CM | POA: Diagnosis not present

## 2021-03-09 DIAGNOSIS — I872 Venous insufficiency (chronic) (peripheral): Secondary | ICD-10-CM | POA: Diagnosis not present

## 2021-03-09 DIAGNOSIS — L97321 Non-pressure chronic ulcer of left ankle limited to breakdown of skin: Secondary | ICD-10-CM | POA: Diagnosis not present

## 2021-03-09 DIAGNOSIS — L97821 Non-pressure chronic ulcer of other part of left lower leg limited to breakdown of skin: Secondary | ICD-10-CM | POA: Diagnosis not present

## 2021-03-10 DIAGNOSIS — F419 Anxiety disorder, unspecified: Secondary | ICD-10-CM | POA: Diagnosis not present

## 2021-03-10 DIAGNOSIS — I10 Essential (primary) hypertension: Secondary | ICD-10-CM | POA: Diagnosis not present

## 2021-03-10 DIAGNOSIS — Z87891 Personal history of nicotine dependence: Secondary | ICD-10-CM | POA: Diagnosis not present

## 2021-03-10 DIAGNOSIS — Z9181 History of falling: Secondary | ICD-10-CM | POA: Diagnosis not present

## 2021-03-10 DIAGNOSIS — L97321 Non-pressure chronic ulcer of left ankle limited to breakdown of skin: Secondary | ICD-10-CM | POA: Diagnosis not present

## 2021-03-10 DIAGNOSIS — I872 Venous insufficiency (chronic) (peripheral): Secondary | ICD-10-CM | POA: Diagnosis not present

## 2021-03-10 DIAGNOSIS — J449 Chronic obstructive pulmonary disease, unspecified: Secondary | ICD-10-CM | POA: Diagnosis not present

## 2021-03-10 DIAGNOSIS — L97821 Non-pressure chronic ulcer of other part of left lower leg limited to breakdown of skin: Secondary | ICD-10-CM | POA: Diagnosis not present

## 2021-03-10 DIAGNOSIS — I890 Lymphedema, not elsewhere classified: Secondary | ICD-10-CM | POA: Diagnosis not present

## 2021-03-11 DIAGNOSIS — I890 Lymphedema, not elsewhere classified: Secondary | ICD-10-CM | POA: Diagnosis not present

## 2021-03-11 DIAGNOSIS — I872 Venous insufficiency (chronic) (peripheral): Secondary | ICD-10-CM | POA: Diagnosis not present

## 2021-03-11 DIAGNOSIS — L97821 Non-pressure chronic ulcer of other part of left lower leg limited to breakdown of skin: Secondary | ICD-10-CM | POA: Diagnosis not present

## 2021-03-11 DIAGNOSIS — J449 Chronic obstructive pulmonary disease, unspecified: Secondary | ICD-10-CM | POA: Diagnosis not present

## 2021-03-11 DIAGNOSIS — L97321 Non-pressure chronic ulcer of left ankle limited to breakdown of skin: Secondary | ICD-10-CM | POA: Diagnosis not present

## 2021-03-11 DIAGNOSIS — I10 Essential (primary) hypertension: Secondary | ICD-10-CM | POA: Diagnosis not present

## 2021-03-12 DIAGNOSIS — J449 Chronic obstructive pulmonary disease, unspecified: Secondary | ICD-10-CM | POA: Diagnosis not present

## 2021-03-12 DIAGNOSIS — S91101A Unspecified open wound of right great toe without damage to nail, initial encounter: Secondary | ICD-10-CM | POA: Diagnosis not present

## 2021-03-12 DIAGNOSIS — K4090 Unilateral inguinal hernia, without obstruction or gangrene, not specified as recurrent: Secondary | ICD-10-CM | POA: Diagnosis not present

## 2021-03-12 DIAGNOSIS — L97329 Non-pressure chronic ulcer of left ankle with unspecified severity: Secondary | ICD-10-CM | POA: Diagnosis not present

## 2021-03-12 DIAGNOSIS — L97321 Non-pressure chronic ulcer of left ankle limited to breakdown of skin: Secondary | ICD-10-CM | POA: Diagnosis not present

## 2021-03-12 DIAGNOSIS — I872 Venous insufficiency (chronic) (peripheral): Secondary | ICD-10-CM | POA: Diagnosis not present

## 2021-03-12 DIAGNOSIS — I890 Lymphedema, not elsewhere classified: Secondary | ICD-10-CM | POA: Diagnosis not present

## 2021-03-13 DIAGNOSIS — I10 Essential (primary) hypertension: Secondary | ICD-10-CM | POA: Diagnosis not present

## 2021-03-13 DIAGNOSIS — I872 Venous insufficiency (chronic) (peripheral): Secondary | ICD-10-CM | POA: Diagnosis not present

## 2021-03-13 DIAGNOSIS — L97321 Non-pressure chronic ulcer of left ankle limited to breakdown of skin: Secondary | ICD-10-CM | POA: Diagnosis not present

## 2021-03-13 DIAGNOSIS — L97821 Non-pressure chronic ulcer of other part of left lower leg limited to breakdown of skin: Secondary | ICD-10-CM | POA: Diagnosis not present

## 2021-03-13 DIAGNOSIS — I890 Lymphedema, not elsewhere classified: Secondary | ICD-10-CM | POA: Diagnosis not present

## 2021-03-13 DIAGNOSIS — J449 Chronic obstructive pulmonary disease, unspecified: Secondary | ICD-10-CM | POA: Diagnosis not present

## 2021-03-14 DIAGNOSIS — I10 Essential (primary) hypertension: Secondary | ICD-10-CM | POA: Diagnosis not present

## 2021-03-14 DIAGNOSIS — L97821 Non-pressure chronic ulcer of other part of left lower leg limited to breakdown of skin: Secondary | ICD-10-CM | POA: Diagnosis not present

## 2021-03-14 DIAGNOSIS — I890 Lymphedema, not elsewhere classified: Secondary | ICD-10-CM | POA: Diagnosis not present

## 2021-03-14 DIAGNOSIS — J449 Chronic obstructive pulmonary disease, unspecified: Secondary | ICD-10-CM | POA: Diagnosis not present

## 2021-03-14 DIAGNOSIS — L97321 Non-pressure chronic ulcer of left ankle limited to breakdown of skin: Secondary | ICD-10-CM | POA: Diagnosis not present

## 2021-03-14 DIAGNOSIS — I872 Venous insufficiency (chronic) (peripheral): Secondary | ICD-10-CM | POA: Diagnosis not present

## 2021-03-15 DIAGNOSIS — L97321 Non-pressure chronic ulcer of left ankle limited to breakdown of skin: Secondary | ICD-10-CM | POA: Diagnosis not present

## 2021-03-15 DIAGNOSIS — I10 Essential (primary) hypertension: Secondary | ICD-10-CM | POA: Diagnosis not present

## 2021-03-15 DIAGNOSIS — I890 Lymphedema, not elsewhere classified: Secondary | ICD-10-CM | POA: Diagnosis not present

## 2021-03-15 DIAGNOSIS — I872 Venous insufficiency (chronic) (peripheral): Secondary | ICD-10-CM | POA: Diagnosis not present

## 2021-03-15 DIAGNOSIS — L97821 Non-pressure chronic ulcer of other part of left lower leg limited to breakdown of skin: Secondary | ICD-10-CM | POA: Diagnosis not present

## 2021-03-15 DIAGNOSIS — J449 Chronic obstructive pulmonary disease, unspecified: Secondary | ICD-10-CM | POA: Diagnosis not present

## 2021-03-16 DIAGNOSIS — J449 Chronic obstructive pulmonary disease, unspecified: Secondary | ICD-10-CM | POA: Diagnosis not present

## 2021-03-16 DIAGNOSIS — L97321 Non-pressure chronic ulcer of left ankle limited to breakdown of skin: Secondary | ICD-10-CM | POA: Diagnosis not present

## 2021-03-16 DIAGNOSIS — I872 Venous insufficiency (chronic) (peripheral): Secondary | ICD-10-CM | POA: Diagnosis not present

## 2021-03-16 DIAGNOSIS — I890 Lymphedema, not elsewhere classified: Secondary | ICD-10-CM | POA: Diagnosis not present

## 2021-03-16 DIAGNOSIS — L97821 Non-pressure chronic ulcer of other part of left lower leg limited to breakdown of skin: Secondary | ICD-10-CM | POA: Diagnosis not present

## 2021-03-16 DIAGNOSIS — I10 Essential (primary) hypertension: Secondary | ICD-10-CM | POA: Diagnosis not present

## 2021-03-17 DIAGNOSIS — I10 Essential (primary) hypertension: Secondary | ICD-10-CM | POA: Diagnosis not present

## 2021-03-17 DIAGNOSIS — I872 Venous insufficiency (chronic) (peripheral): Secondary | ICD-10-CM | POA: Diagnosis not present

## 2021-03-17 DIAGNOSIS — L97821 Non-pressure chronic ulcer of other part of left lower leg limited to breakdown of skin: Secondary | ICD-10-CM | POA: Diagnosis not present

## 2021-03-17 DIAGNOSIS — I890 Lymphedema, not elsewhere classified: Secondary | ICD-10-CM | POA: Diagnosis not present

## 2021-03-17 DIAGNOSIS — L97321 Non-pressure chronic ulcer of left ankle limited to breakdown of skin: Secondary | ICD-10-CM | POA: Diagnosis not present

## 2021-03-17 DIAGNOSIS — J449 Chronic obstructive pulmonary disease, unspecified: Secondary | ICD-10-CM | POA: Diagnosis not present

## 2021-03-18 DIAGNOSIS — J449 Chronic obstructive pulmonary disease, unspecified: Secondary | ICD-10-CM | POA: Diagnosis not present

## 2021-03-18 DIAGNOSIS — I890 Lymphedema, not elsewhere classified: Secondary | ICD-10-CM | POA: Diagnosis not present

## 2021-03-18 DIAGNOSIS — I872 Venous insufficiency (chronic) (peripheral): Secondary | ICD-10-CM | POA: Diagnosis not present

## 2021-03-18 DIAGNOSIS — L97321 Non-pressure chronic ulcer of left ankle limited to breakdown of skin: Secondary | ICD-10-CM | POA: Diagnosis not present

## 2021-03-18 DIAGNOSIS — I10 Essential (primary) hypertension: Secondary | ICD-10-CM | POA: Diagnosis not present

## 2021-03-18 DIAGNOSIS — L97821 Non-pressure chronic ulcer of other part of left lower leg limited to breakdown of skin: Secondary | ICD-10-CM | POA: Diagnosis not present

## 2021-03-19 DIAGNOSIS — I872 Venous insufficiency (chronic) (peripheral): Secondary | ICD-10-CM | POA: Diagnosis not present

## 2021-03-19 DIAGNOSIS — S91101A Unspecified open wound of right great toe without damage to nail, initial encounter: Secondary | ICD-10-CM | POA: Diagnosis not present

## 2021-03-19 DIAGNOSIS — I890 Lymphedema, not elsewhere classified: Secondary | ICD-10-CM | POA: Diagnosis not present

## 2021-03-19 DIAGNOSIS — I83023 Varicose veins of left lower extremity with ulcer of ankle: Secondary | ICD-10-CM | POA: Diagnosis not present

## 2021-03-19 DIAGNOSIS — L97329 Non-pressure chronic ulcer of left ankle with unspecified severity: Secondary | ICD-10-CM | POA: Diagnosis not present

## 2021-03-19 DIAGNOSIS — K4090 Unilateral inguinal hernia, without obstruction or gangrene, not specified as recurrent: Secondary | ICD-10-CM | POA: Diagnosis not present

## 2021-03-19 DIAGNOSIS — J449 Chronic obstructive pulmonary disease, unspecified: Secondary | ICD-10-CM | POA: Diagnosis not present

## 2021-03-19 DIAGNOSIS — L97321 Non-pressure chronic ulcer of left ankle limited to breakdown of skin: Secondary | ICD-10-CM | POA: Diagnosis not present

## 2021-03-21 ENCOUNTER — Ambulatory Visit (HOSPITAL_COMMUNITY): Payer: Medicare Other

## 2021-03-21 DIAGNOSIS — I872 Venous insufficiency (chronic) (peripheral): Secondary | ICD-10-CM | POA: Diagnosis not present

## 2021-03-21 DIAGNOSIS — I10 Essential (primary) hypertension: Secondary | ICD-10-CM | POA: Diagnosis not present

## 2021-03-21 DIAGNOSIS — L97321 Non-pressure chronic ulcer of left ankle limited to breakdown of skin: Secondary | ICD-10-CM | POA: Diagnosis not present

## 2021-03-21 DIAGNOSIS — I890 Lymphedema, not elsewhere classified: Secondary | ICD-10-CM | POA: Diagnosis not present

## 2021-03-21 DIAGNOSIS — J449 Chronic obstructive pulmonary disease, unspecified: Secondary | ICD-10-CM | POA: Diagnosis not present

## 2021-03-21 DIAGNOSIS — L97821 Non-pressure chronic ulcer of other part of left lower leg limited to breakdown of skin: Secondary | ICD-10-CM | POA: Diagnosis not present

## 2021-03-21 NOTE — Progress Notes (Deleted)
[facility]   Reason for referral: Swollen *** leg  History of Present Illness  Stacy Case is a 70 y.o. female who presents with chief complaint: swollen leg.  Patient notes, onset of swelling *** months ago, associated with ***.  The patient has had *** history of DVT, *** history of varicose vein, *** history of venous stasis ulcers, *** history of  Lymphedema and *** history of skin changes in lower legs.  There is *** family history of venous disorders.  The patient has *** used compression stockings in the past.   She was seen in the past originally for arterial disease and non healing wounds.  She under went angiogram 11/20/2013.  There are no options for revascularization on the left. She does have good flow through the anterior tibial artery and peroneal arteries however.  She was last seen 03/14/2014.    Past Medical History:  Diagnosis Date  . Anxiety   . Arthritis   . Cellulitis of ankle   . COPD (chronic obstructive pulmonary disease) ([place])   . Cystocele   . Depression   . GERD (gastroesophageal reflux disease)   . Headache    hx of migraines  . Hyperlipidemia   . Hypertension   . IBS (irritable bowel syndrome)   . Peripheral arterial disease (HCC)    legs  . Pneumonia   . Right inguinal hernia     Past Surgical History:  Procedure Laterality Date  . ABDOMINAL AORTAGRAM N/A 11/20/2013   Procedure: ABDOMINAL Maxcine Ham;  Surgeon: Angelia Mould, MD;  Location: [facility] CATH LAB;  Service: Cardiovascular;  Laterality: N/A;  . APPENDECTOMY    . JOINT REPLACEMENT     Left hip 2018  . TOTAL HIP ARTHROPLASTY Right 02/28/2020   Procedure: TOTAL HIP ARTHROPLASTY ANTERIOR APPROACH;  Surgeon: Gaynelle Arabian, MD;  Location: [facility] ORS;  Service: Orthopedics;  Laterality: Right;  192min  . WRIST SURGERY      Social History   Socioeconomic History  . Marital status: Single    Spouse name: Not on file  . Number of children: 3  . Years of  education: Not on file  . Highest education level: Not on file  Occupational History  . Not on file  Tobacco Use  . Smoking status: Former Smoker    Packs/day: 1.00    Years: 40.00    Pack years: 40.00    Types: Cigarettes    Quit date: 02/20/2017    Years since quitting: 4.0  . Smokeless tobacco: Never Used  Vaping Use  . Vaping Use: Every day  . Substances: Nicotine, Flavoring  Substance and Sexual Activity  . Alcohol use: No  . Drug use: No  . Sexual activity: Not Currently  Other Topics Concern  . Not on file  Social History Narrative  . Not on file   Social Determinants of Health   Financial Resource Strain: Not on file  Food Insecurity: Not on file  Transportation Needs: Not on file  Physical Activity: Not on file  Stress: Not on file  Social Connections: Not on file  Intimate Partner Violence: Not on file    Family History  Problem Relation Age of Onset  . Varicose Veins Mother   . Clotting disorder Mother   . Diabetes Brother   . Hypertension Brother   . Diabetes Daughter   . Hyperlipidemia Daughter   . Hypertension Daughter     Current Outpatient Medications on File Prior to Visit  Medication Sig Dispense Refill  . alendronate (FOSAMAX) 70 MG tablet Take 70 mg by mouth every 7 (seven) days.    Marland Kitchen ascorbic acid (VITAMIN C) 500 MG tablet Take 500 mg by mouth daily.    Marland Kitchen atenolol (TENORMIN) 50 MG tablet Take 50 mg by mouth daily.    Marland Kitchen atorvastatin (LIPITOR) 40 MG tablet Take 40 mg by mouth daily.    . Cholecalciferol (VITAMIN D3) 10 MCG (400 UNIT) CAPS Take 400 Units by mouth daily.    Marland Kitchen dicyclomine (BENTYL) 20 MG tablet Take 20 mg by mouth 4 (four) times daily - after meals and at bedtime.    Marland Kitchen diltiazem (TIAZAC) 240 MG 24 hr capsule Take 240 mg by mouth daily.    . furosemide (LASIX) 20 MG tablet Take 20 mg by mouth daily.    Marland Kitchen HYDROcodone-acetaminophen (NORCO/VICODIN) 5-325 MG tablet Take 1-2 tablets by mouth every 6 (six) hours as needed for moderate  pain (pain score 4-6). 56 tablet 0  . iron polysaccharides (NIFEREX) 150 MG capsule Take 150 mg by mouth 2 (two) times daily.    Marland Kitchen loratadine (CLARITIN) 10 MG tablet Take 10 mg by mouth daily.    . methocarbamol (ROBAXIN) 500 MG tablet Take 1 tablet (500 mg total) by mouth every 6 (six) hours as needed for muscle spasms. 40 tablet 0  . mirtazapine (REMERON) 30 MG tablet Take 30 mg by mouth at bedtime.    . Multiple Vitamin (MULTIVITAMIN WITH MINERALS) TABS tablet Take 1 tablet by mouth daily.    . ondansetron (ZOFRAN) 8 MG tablet Take 8 mg by mouth every 8 (eight) hours as needed for nausea or vomiting.    . pantoprazole (PROTONIX) 40 MG tablet Take 40 mg by mouth daily.    . silver sulfADIAZINE (SILVADENE) 1 % cream Apply 1 application topically daily as needed (irritation).    . traMADol (ULTRAM) 50 MG tablet Take 100 mg by mouth every 6 (six) hours as needed for moderate pain.     No current facility-administered medications on file prior to visit.    Allergies as of 03/21/2021 - Review Complete 02/28/2020  Allergen Reaction Noted  . Penicillins Rash 11/15/2013     ROS:   General:  No weight loss, Fever, chills  HEENT: No recent headaches, no nasal bleeding, no visual changes, no sore throat  Neurologic: No dizziness, blackouts, seizures. No recent symptoms of stroke or mini- stroke. No recent episodes of slurred speech, or temporary blindness.  Cardiac: No recent episodes of chest pain/pressure, no shortness of breath at rest.  No shortness of breath with exertion.  Denies history of atrial fibrillation or irregular heartbeat  Vascular: No history of rest pain in feet.  No history of claudication.  No history of non-healing ulcer, No history of DVT   Pulmonary: No home oxygen, no productive cough, no hemoptysis,  No asthma or wheezing  Musculoskeletal:  [ ]  Arthritis, [ ]  Low back pain,  [ ]  Joint pain  Hematologic:No history of hypercoagulable state.  No history of easy  bleeding.  No history of anemia  Gastrointestinal: No hematochezia or melena,  No gastroesophageal reflux, no trouble swallowing  Urinary: [ ]  chronic Kidney disease, [ ]  on HD - [ ]  MWF or [ ]  TTHS, [ ]  Burning with urination, [ ]  Frequent urination, [ ]  Difficulty urinating;   Skin: No rashes  Psychological: No history of anxiety,  No history of depression  Physical Examination  There were no vitals  filed for this visit.  There is no height or weight on file to calculate BMI.  General:  Alert and oriented, no acute distress HEENT: Normal Neck: No bruit or JVD Pulmonary: Clear to auscultation bilaterally Cardiac: Regular Rate and Rhythm without murmur Abdomen: Soft, non-tender, non-distended, no mass, no scars Skin: No rash Extremity Pulses:  2+ radial, brachial, femoral, dorsalis pedis, posterior tibial pulses bilaterally Musculoskeletal: No deformity or edema  Neurologic: Upper and lower extremity motor 5/5 and symmetric  DATA:  Assessment:  Plan:     [facility] [place] Office: [phone]  MD in clinic [place]

## 2021-03-24 DIAGNOSIS — I10 Essential (primary) hypertension: Secondary | ICD-10-CM | POA: Diagnosis not present

## 2021-03-24 DIAGNOSIS — L97321 Non-pressure chronic ulcer of left ankle limited to breakdown of skin: Secondary | ICD-10-CM | POA: Diagnosis not present

## 2021-03-24 DIAGNOSIS — I890 Lymphedema, not elsewhere classified: Secondary | ICD-10-CM | POA: Diagnosis not present

## 2021-03-24 DIAGNOSIS — L97821 Non-pressure chronic ulcer of other part of left lower leg limited to breakdown of skin: Secondary | ICD-10-CM | POA: Diagnosis not present

## 2021-03-24 DIAGNOSIS — I872 Venous insufficiency (chronic) (peripheral): Secondary | ICD-10-CM | POA: Diagnosis not present

## 2021-03-24 DIAGNOSIS — J449 Chronic obstructive pulmonary disease, unspecified: Secondary | ICD-10-CM | POA: Diagnosis not present

## 2021-03-26 DIAGNOSIS — Z01812 Encounter for preprocedural laboratory examination: Secondary | ICD-10-CM | POA: Diagnosis not present

## 2021-03-26 DIAGNOSIS — Z20822 Contact with and (suspected) exposure to covid-19: Secondary | ICD-10-CM | POA: Diagnosis not present

## 2021-03-27 DIAGNOSIS — I10 Essential (primary) hypertension: Secondary | ICD-10-CM | POA: Diagnosis not present

## 2021-03-27 DIAGNOSIS — K4090 Unilateral inguinal hernia, without obstruction or gangrene, not specified as recurrent: Secondary | ICD-10-CM | POA: Diagnosis not present

## 2021-03-27 DIAGNOSIS — I872 Venous insufficiency (chronic) (peripheral): Secondary | ICD-10-CM | POA: Diagnosis not present

## 2021-03-27 DIAGNOSIS — L97821 Non-pressure chronic ulcer of other part of left lower leg limited to breakdown of skin: Secondary | ICD-10-CM | POA: Diagnosis not present

## 2021-03-27 DIAGNOSIS — J449 Chronic obstructive pulmonary disease, unspecified: Secondary | ICD-10-CM | POA: Diagnosis not present

## 2021-03-27 DIAGNOSIS — L97321 Non-pressure chronic ulcer of left ankle limited to breakdown of skin: Secondary | ICD-10-CM | POA: Diagnosis not present

## 2021-03-27 DIAGNOSIS — I890 Lymphedema, not elsewhere classified: Secondary | ICD-10-CM | POA: Diagnosis not present

## 2021-03-27 DIAGNOSIS — S91101A Unspecified open wound of right great toe without damage to nail, initial encounter: Secondary | ICD-10-CM | POA: Diagnosis not present

## 2021-03-27 DIAGNOSIS — L97329 Non-pressure chronic ulcer of left ankle with unspecified severity: Secondary | ICD-10-CM | POA: Diagnosis not present

## 2021-03-28 DIAGNOSIS — M5136 Other intervertebral disc degeneration, lumbar region: Secondary | ICD-10-CM | POA: Diagnosis not present

## 2021-03-28 DIAGNOSIS — M4316 Spondylolisthesis, lumbar region: Secondary | ICD-10-CM | POA: Diagnosis not present

## 2021-03-28 DIAGNOSIS — Z7983 Long term (current) use of bisphosphonates: Secondary | ICD-10-CM | POA: Diagnosis not present

## 2021-03-28 DIAGNOSIS — J449 Chronic obstructive pulmonary disease, unspecified: Secondary | ICD-10-CM | POA: Diagnosis not present

## 2021-03-28 DIAGNOSIS — D649 Anemia, unspecified: Secondary | ICD-10-CM | POA: Diagnosis not present

## 2021-03-28 DIAGNOSIS — I10 Essential (primary) hypertension: Secondary | ICD-10-CM | POA: Diagnosis not present

## 2021-03-28 DIAGNOSIS — Z87891 Personal history of nicotine dependence: Secondary | ICD-10-CM | POA: Diagnosis not present

## 2021-03-28 DIAGNOSIS — I70203 Unspecified atherosclerosis of native arteries of extremities, bilateral legs: Secondary | ICD-10-CM | POA: Diagnosis not present

## 2021-03-28 DIAGNOSIS — Z96643 Presence of artificial hip joint, bilateral: Secondary | ICD-10-CM | POA: Diagnosis not present

## 2021-03-28 DIAGNOSIS — I700 Atherosclerosis of aorta: Secondary | ICD-10-CM | POA: Diagnosis not present

## 2021-03-28 DIAGNOSIS — L97922 Non-pressure chronic ulcer of unspecified part of left lower leg with fat layer exposed: Secondary | ICD-10-CM | POA: Diagnosis not present

## 2021-03-28 DIAGNOSIS — Z7982 Long term (current) use of aspirin: Secondary | ICD-10-CM | POA: Diagnosis not present

## 2021-03-28 DIAGNOSIS — I872 Venous insufficiency (chronic) (peripheral): Secondary | ICD-10-CM | POA: Diagnosis not present

## 2021-03-28 DIAGNOSIS — Z79899 Other long term (current) drug therapy: Secondary | ICD-10-CM | POA: Diagnosis not present

## 2021-03-28 DIAGNOSIS — I701 Atherosclerosis of renal artery: Secondary | ICD-10-CM | POA: Diagnosis not present

## 2021-03-28 DIAGNOSIS — K4030 Unilateral inguinal hernia, with obstruction, without gangrene, not specified as recurrent: Secondary | ICD-10-CM | POA: Diagnosis not present

## 2021-03-28 DIAGNOSIS — K4090 Unilateral inguinal hernia, without obstruction or gangrene, not specified as recurrent: Secondary | ICD-10-CM | POA: Diagnosis not present

## 2021-03-31 DIAGNOSIS — L97321 Non-pressure chronic ulcer of left ankle limited to breakdown of skin: Secondary | ICD-10-CM | POA: Diagnosis not present

## 2021-03-31 DIAGNOSIS — I890 Lymphedema, not elsewhere classified: Secondary | ICD-10-CM | POA: Diagnosis not present

## 2021-03-31 DIAGNOSIS — J449 Chronic obstructive pulmonary disease, unspecified: Secondary | ICD-10-CM | POA: Diagnosis not present

## 2021-03-31 DIAGNOSIS — I872 Venous insufficiency (chronic) (peripheral): Secondary | ICD-10-CM | POA: Diagnosis not present

## 2021-03-31 DIAGNOSIS — I10 Essential (primary) hypertension: Secondary | ICD-10-CM | POA: Diagnosis not present

## 2021-03-31 DIAGNOSIS — L97821 Non-pressure chronic ulcer of other part of left lower leg limited to breakdown of skin: Secondary | ICD-10-CM | POA: Diagnosis not present

## 2021-04-02 DIAGNOSIS — I700 Atherosclerosis of aorta: Secondary | ICD-10-CM | POA: Diagnosis not present

## 2021-04-02 DIAGNOSIS — I739 Peripheral vascular disease, unspecified: Secondary | ICD-10-CM | POA: Diagnosis not present

## 2021-04-02 DIAGNOSIS — N1831 Chronic kidney disease, stage 3a: Secondary | ICD-10-CM | POA: Diagnosis not present

## 2021-04-02 DIAGNOSIS — I10 Essential (primary) hypertension: Secondary | ICD-10-CM | POA: Diagnosis not present

## 2021-04-02 DIAGNOSIS — E7849 Other hyperlipidemia: Secondary | ICD-10-CM | POA: Diagnosis not present

## 2021-04-02 DIAGNOSIS — K4090 Unilateral inguinal hernia, without obstruction or gangrene, not specified as recurrent: Secondary | ICD-10-CM | POA: Diagnosis not present

## 2021-04-02 DIAGNOSIS — Z4800 Encounter for change or removal of nonsurgical wound dressing: Secondary | ICD-10-CM | POA: Diagnosis not present

## 2021-04-02 DIAGNOSIS — R7301 Impaired fasting glucose: Secondary | ICD-10-CM | POA: Diagnosis not present

## 2021-04-02 DIAGNOSIS — I83023 Varicose veins of left lower extremity with ulcer of ankle: Secondary | ICD-10-CM | POA: Diagnosis not present

## 2021-04-02 DIAGNOSIS — E7800 Pure hypercholesterolemia, unspecified: Secondary | ICD-10-CM | POA: Diagnosis not present

## 2021-04-02 DIAGNOSIS — D649 Anemia, unspecified: Secondary | ICD-10-CM | POA: Diagnosis not present

## 2021-04-02 DIAGNOSIS — K219 Gastro-esophageal reflux disease without esophagitis: Secondary | ICD-10-CM | POA: Diagnosis not present

## 2021-04-02 DIAGNOSIS — L97321 Non-pressure chronic ulcer of left ankle limited to breakdown of skin: Secondary | ICD-10-CM | POA: Diagnosis not present

## 2021-04-02 DIAGNOSIS — I890 Lymphedema, not elsewhere classified: Secondary | ICD-10-CM | POA: Diagnosis not present

## 2021-04-02 DIAGNOSIS — Z79899 Other long term (current) drug therapy: Secondary | ICD-10-CM | POA: Diagnosis not present

## 2021-04-02 DIAGNOSIS — Z7982 Long term (current) use of aspirin: Secondary | ICD-10-CM | POA: Diagnosis not present

## 2021-04-02 DIAGNOSIS — E7801 Familial hypercholesterolemia: Secondary | ICD-10-CM | POA: Diagnosis not present

## 2021-04-02 DIAGNOSIS — E782 Mixed hyperlipidemia: Secondary | ICD-10-CM | POA: Diagnosis not present

## 2021-04-02 DIAGNOSIS — E876 Hypokalemia: Secondary | ICD-10-CM | POA: Diagnosis not present

## 2021-04-02 DIAGNOSIS — Z8616 Personal history of COVID-19: Secondary | ICD-10-CM | POA: Diagnosis not present

## 2021-04-02 DIAGNOSIS — D519 Vitamin B12 deficiency anemia, unspecified: Secondary | ICD-10-CM | POA: Diagnosis not present

## 2021-04-02 DIAGNOSIS — D500 Iron deficiency anemia secondary to blood loss (chronic): Secondary | ICD-10-CM | POA: Diagnosis not present

## 2021-04-03 DIAGNOSIS — L97321 Non-pressure chronic ulcer of left ankle limited to breakdown of skin: Secondary | ICD-10-CM | POA: Diagnosis not present

## 2021-04-03 DIAGNOSIS — I10 Essential (primary) hypertension: Secondary | ICD-10-CM | POA: Diagnosis not present

## 2021-04-03 DIAGNOSIS — I872 Venous insufficiency (chronic) (peripheral): Secondary | ICD-10-CM | POA: Diagnosis not present

## 2021-04-03 DIAGNOSIS — I890 Lymphedema, not elsewhere classified: Secondary | ICD-10-CM | POA: Diagnosis not present

## 2021-04-03 DIAGNOSIS — J449 Chronic obstructive pulmonary disease, unspecified: Secondary | ICD-10-CM | POA: Diagnosis not present

## 2021-04-03 DIAGNOSIS — L97821 Non-pressure chronic ulcer of other part of left lower leg limited to breakdown of skin: Secondary | ICD-10-CM | POA: Diagnosis not present

## 2021-04-07 DIAGNOSIS — L97821 Non-pressure chronic ulcer of other part of left lower leg limited to breakdown of skin: Secondary | ICD-10-CM | POA: Diagnosis not present

## 2021-04-07 DIAGNOSIS — I10 Essential (primary) hypertension: Secondary | ICD-10-CM | POA: Diagnosis not present

## 2021-04-07 DIAGNOSIS — I872 Venous insufficiency (chronic) (peripheral): Secondary | ICD-10-CM | POA: Diagnosis not present

## 2021-04-07 DIAGNOSIS — I890 Lymphedema, not elsewhere classified: Secondary | ICD-10-CM | POA: Diagnosis not present

## 2021-04-07 DIAGNOSIS — L97321 Non-pressure chronic ulcer of left ankle limited to breakdown of skin: Secondary | ICD-10-CM | POA: Diagnosis not present

## 2021-04-07 DIAGNOSIS — J449 Chronic obstructive pulmonary disease, unspecified: Secondary | ICD-10-CM | POA: Diagnosis not present

## 2021-04-09 DIAGNOSIS — Z9181 History of falling: Secondary | ICD-10-CM | POA: Diagnosis not present

## 2021-04-09 DIAGNOSIS — L97321 Non-pressure chronic ulcer of left ankle limited to breakdown of skin: Secondary | ICD-10-CM | POA: Diagnosis not present

## 2021-04-09 DIAGNOSIS — I10 Essential (primary) hypertension: Secondary | ICD-10-CM | POA: Diagnosis not present

## 2021-04-09 DIAGNOSIS — I872 Venous insufficiency (chronic) (peripheral): Secondary | ICD-10-CM | POA: Diagnosis not present

## 2021-04-09 DIAGNOSIS — Z87891 Personal history of nicotine dependence: Secondary | ICD-10-CM | POA: Diagnosis not present

## 2021-04-09 DIAGNOSIS — J449 Chronic obstructive pulmonary disease, unspecified: Secondary | ICD-10-CM | POA: Diagnosis not present

## 2021-04-09 DIAGNOSIS — I890 Lymphedema, not elsewhere classified: Secondary | ICD-10-CM | POA: Diagnosis not present

## 2021-04-09 DIAGNOSIS — L97821 Non-pressure chronic ulcer of other part of left lower leg limited to breakdown of skin: Secondary | ICD-10-CM | POA: Diagnosis not present

## 2021-04-09 DIAGNOSIS — F419 Anxiety disorder, unspecified: Secondary | ICD-10-CM | POA: Diagnosis not present

## 2021-04-10 DIAGNOSIS — L280 Lichen simplex chronicus: Secondary | ICD-10-CM | POA: Diagnosis not present

## 2021-04-10 DIAGNOSIS — I10 Essential (primary) hypertension: Secondary | ICD-10-CM | POA: Diagnosis not present

## 2021-04-10 DIAGNOSIS — Z1389 Encounter for screening for other disorder: Secondary | ICD-10-CM | POA: Diagnosis not present

## 2021-04-10 DIAGNOSIS — L97821 Non-pressure chronic ulcer of other part of left lower leg limited to breakdown of skin: Secondary | ICD-10-CM | POA: Diagnosis not present

## 2021-04-10 DIAGNOSIS — Z23 Encounter for immunization: Secondary | ICD-10-CM | POA: Diagnosis not present

## 2021-04-10 DIAGNOSIS — I890 Lymphedema, not elsewhere classified: Secondary | ICD-10-CM | POA: Diagnosis not present

## 2021-04-10 DIAGNOSIS — I872 Venous insufficiency (chronic) (peripheral): Secondary | ICD-10-CM | POA: Diagnosis not present

## 2021-04-10 DIAGNOSIS — L97321 Non-pressure chronic ulcer of left ankle limited to breakdown of skin: Secondary | ICD-10-CM | POA: Diagnosis not present

## 2021-04-10 DIAGNOSIS — Z1331 Encounter for screening for depression: Secondary | ICD-10-CM | POA: Diagnosis not present

## 2021-04-10 DIAGNOSIS — D500 Iron deficiency anemia secondary to blood loss (chronic): Secondary | ICD-10-CM | POA: Diagnosis not present

## 2021-04-10 DIAGNOSIS — J449 Chronic obstructive pulmonary disease, unspecified: Secondary | ICD-10-CM | POA: Diagnosis not present

## 2021-04-15 DIAGNOSIS — L97321 Non-pressure chronic ulcer of left ankle limited to breakdown of skin: Secondary | ICD-10-CM | POA: Diagnosis not present

## 2021-04-15 DIAGNOSIS — I890 Lymphedema, not elsewhere classified: Secondary | ICD-10-CM | POA: Diagnosis not present

## 2021-04-15 DIAGNOSIS — L97821 Non-pressure chronic ulcer of other part of left lower leg limited to breakdown of skin: Secondary | ICD-10-CM | POA: Diagnosis not present

## 2021-04-15 DIAGNOSIS — I10 Essential (primary) hypertension: Secondary | ICD-10-CM | POA: Diagnosis not present

## 2021-04-15 DIAGNOSIS — I872 Venous insufficiency (chronic) (peripheral): Secondary | ICD-10-CM | POA: Diagnosis not present

## 2021-04-15 DIAGNOSIS — J449 Chronic obstructive pulmonary disease, unspecified: Secondary | ICD-10-CM | POA: Diagnosis not present

## 2021-04-16 ENCOUNTER — Ambulatory Visit (HOSPITAL_COMMUNITY)
Admission: RE | Admit: 2021-04-16 | Payer: Medicare Other | Source: Ambulatory Visit | Attending: Vascular Surgery | Admitting: Vascular Surgery

## 2021-04-16 DIAGNOSIS — L97321 Non-pressure chronic ulcer of left ankle limited to breakdown of skin: Secondary | ICD-10-CM | POA: Diagnosis not present

## 2021-04-16 DIAGNOSIS — I700 Atherosclerosis of aorta: Secondary | ICD-10-CM | POA: Diagnosis not present

## 2021-04-16 DIAGNOSIS — I890 Lymphedema, not elsewhere classified: Secondary | ICD-10-CM | POA: Diagnosis not present

## 2021-04-16 DIAGNOSIS — Z4800 Encounter for change or removal of nonsurgical wound dressing: Secondary | ICD-10-CM | POA: Diagnosis not present

## 2021-04-16 DIAGNOSIS — I83023 Varicose veins of left lower extremity with ulcer of ankle: Secondary | ICD-10-CM | POA: Diagnosis not present

## 2021-04-16 DIAGNOSIS — I739 Peripheral vascular disease, unspecified: Secondary | ICD-10-CM | POA: Diagnosis not present

## 2021-04-17 DIAGNOSIS — I890 Lymphedema, not elsewhere classified: Secondary | ICD-10-CM | POA: Diagnosis not present

## 2021-04-17 DIAGNOSIS — L97321 Non-pressure chronic ulcer of left ankle limited to breakdown of skin: Secondary | ICD-10-CM | POA: Diagnosis not present

## 2021-04-17 DIAGNOSIS — L97821 Non-pressure chronic ulcer of other part of left lower leg limited to breakdown of skin: Secondary | ICD-10-CM | POA: Diagnosis not present

## 2021-04-17 DIAGNOSIS — I872 Venous insufficiency (chronic) (peripheral): Secondary | ICD-10-CM | POA: Diagnosis not present

## 2021-04-17 DIAGNOSIS — I10 Essential (primary) hypertension: Secondary | ICD-10-CM | POA: Diagnosis not present

## 2021-04-17 DIAGNOSIS — J449 Chronic obstructive pulmonary disease, unspecified: Secondary | ICD-10-CM | POA: Diagnosis not present

## 2021-04-18 DIAGNOSIS — L97821 Non-pressure chronic ulcer of other part of left lower leg limited to breakdown of skin: Secondary | ICD-10-CM | POA: Diagnosis not present

## 2021-04-18 DIAGNOSIS — J449 Chronic obstructive pulmonary disease, unspecified: Secondary | ICD-10-CM | POA: Diagnosis not present

## 2021-04-18 DIAGNOSIS — I10 Essential (primary) hypertension: Secondary | ICD-10-CM | POA: Diagnosis not present

## 2021-04-18 DIAGNOSIS — L97321 Non-pressure chronic ulcer of left ankle limited to breakdown of skin: Secondary | ICD-10-CM | POA: Diagnosis not present

## 2021-04-18 DIAGNOSIS — I890 Lymphedema, not elsewhere classified: Secondary | ICD-10-CM | POA: Diagnosis not present

## 2021-04-18 DIAGNOSIS — I872 Venous insufficiency (chronic) (peripheral): Secondary | ICD-10-CM | POA: Diagnosis not present

## 2021-04-19 DIAGNOSIS — L97821 Non-pressure chronic ulcer of other part of left lower leg limited to breakdown of skin: Secondary | ICD-10-CM | POA: Diagnosis not present

## 2021-04-19 DIAGNOSIS — I890 Lymphedema, not elsewhere classified: Secondary | ICD-10-CM | POA: Diagnosis not present

## 2021-04-19 DIAGNOSIS — L97321 Non-pressure chronic ulcer of left ankle limited to breakdown of skin: Secondary | ICD-10-CM | POA: Diagnosis not present

## 2021-04-19 DIAGNOSIS — I10 Essential (primary) hypertension: Secondary | ICD-10-CM | POA: Diagnosis not present

## 2021-04-19 DIAGNOSIS — I872 Venous insufficiency (chronic) (peripheral): Secondary | ICD-10-CM | POA: Diagnosis not present

## 2021-04-19 DIAGNOSIS — J449 Chronic obstructive pulmonary disease, unspecified: Secondary | ICD-10-CM | POA: Diagnosis not present

## 2021-04-20 DIAGNOSIS — L97821 Non-pressure chronic ulcer of other part of left lower leg limited to breakdown of skin: Secondary | ICD-10-CM | POA: Diagnosis not present

## 2021-04-20 DIAGNOSIS — L97321 Non-pressure chronic ulcer of left ankle limited to breakdown of skin: Secondary | ICD-10-CM | POA: Diagnosis not present

## 2021-04-20 DIAGNOSIS — I10 Essential (primary) hypertension: Secondary | ICD-10-CM | POA: Diagnosis not present

## 2021-04-20 DIAGNOSIS — I890 Lymphedema, not elsewhere classified: Secondary | ICD-10-CM | POA: Diagnosis not present

## 2021-04-20 DIAGNOSIS — I872 Venous insufficiency (chronic) (peripheral): Secondary | ICD-10-CM | POA: Diagnosis not present

## 2021-04-20 DIAGNOSIS — J449 Chronic obstructive pulmonary disease, unspecified: Secondary | ICD-10-CM | POA: Diagnosis not present

## 2021-04-21 DIAGNOSIS — J449 Chronic obstructive pulmonary disease, unspecified: Secondary | ICD-10-CM | POA: Diagnosis not present

## 2021-04-21 DIAGNOSIS — I890 Lymphedema, not elsewhere classified: Secondary | ICD-10-CM | POA: Diagnosis not present

## 2021-04-21 DIAGNOSIS — L97321 Non-pressure chronic ulcer of left ankle limited to breakdown of skin: Secondary | ICD-10-CM | POA: Diagnosis not present

## 2021-04-21 DIAGNOSIS — I872 Venous insufficiency (chronic) (peripheral): Secondary | ICD-10-CM | POA: Diagnosis not present

## 2021-04-21 DIAGNOSIS — L97821 Non-pressure chronic ulcer of other part of left lower leg limited to breakdown of skin: Secondary | ICD-10-CM | POA: Diagnosis not present

## 2021-04-21 DIAGNOSIS — I10 Essential (primary) hypertension: Secondary | ICD-10-CM | POA: Diagnosis not present

## 2021-04-22 DIAGNOSIS — J449 Chronic obstructive pulmonary disease, unspecified: Secondary | ICD-10-CM | POA: Diagnosis not present

## 2021-04-22 DIAGNOSIS — L97321 Non-pressure chronic ulcer of left ankle limited to breakdown of skin: Secondary | ICD-10-CM | POA: Diagnosis not present

## 2021-04-22 DIAGNOSIS — I10 Essential (primary) hypertension: Secondary | ICD-10-CM | POA: Diagnosis not present

## 2021-04-22 DIAGNOSIS — I872 Venous insufficiency (chronic) (peripheral): Secondary | ICD-10-CM | POA: Diagnosis not present

## 2021-04-22 DIAGNOSIS — L97821 Non-pressure chronic ulcer of other part of left lower leg limited to breakdown of skin: Secondary | ICD-10-CM | POA: Diagnosis not present

## 2021-04-22 DIAGNOSIS — I890 Lymphedema, not elsewhere classified: Secondary | ICD-10-CM | POA: Diagnosis not present

## 2021-04-23 DIAGNOSIS — I890 Lymphedema, not elsewhere classified: Secondary | ICD-10-CM | POA: Diagnosis not present

## 2021-04-23 DIAGNOSIS — L97821 Non-pressure chronic ulcer of other part of left lower leg limited to breakdown of skin: Secondary | ICD-10-CM | POA: Diagnosis not present

## 2021-04-23 DIAGNOSIS — J449 Chronic obstructive pulmonary disease, unspecified: Secondary | ICD-10-CM | POA: Diagnosis not present

## 2021-04-23 DIAGNOSIS — I10 Essential (primary) hypertension: Secondary | ICD-10-CM | POA: Diagnosis not present

## 2021-04-23 DIAGNOSIS — I872 Venous insufficiency (chronic) (peripheral): Secondary | ICD-10-CM | POA: Diagnosis not present

## 2021-04-23 DIAGNOSIS — L97321 Non-pressure chronic ulcer of left ankle limited to breakdown of skin: Secondary | ICD-10-CM | POA: Diagnosis not present

## 2021-04-24 DIAGNOSIS — J449 Chronic obstructive pulmonary disease, unspecified: Secondary | ICD-10-CM | POA: Diagnosis not present

## 2021-04-24 DIAGNOSIS — L97321 Non-pressure chronic ulcer of left ankle limited to breakdown of skin: Secondary | ICD-10-CM | POA: Diagnosis not present

## 2021-04-24 DIAGNOSIS — L97821 Non-pressure chronic ulcer of other part of left lower leg limited to breakdown of skin: Secondary | ICD-10-CM | POA: Diagnosis not present

## 2021-04-24 DIAGNOSIS — I890 Lymphedema, not elsewhere classified: Secondary | ICD-10-CM | POA: Diagnosis not present

## 2021-04-24 DIAGNOSIS — I872 Venous insufficiency (chronic) (peripheral): Secondary | ICD-10-CM | POA: Diagnosis not present

## 2021-04-24 DIAGNOSIS — I10 Essential (primary) hypertension: Secondary | ICD-10-CM | POA: Diagnosis not present

## 2021-04-25 DIAGNOSIS — L97821 Non-pressure chronic ulcer of other part of left lower leg limited to breakdown of skin: Secondary | ICD-10-CM | POA: Diagnosis not present

## 2021-04-25 DIAGNOSIS — I10 Essential (primary) hypertension: Secondary | ICD-10-CM | POA: Diagnosis not present

## 2021-04-25 DIAGNOSIS — I890 Lymphedema, not elsewhere classified: Secondary | ICD-10-CM | POA: Diagnosis not present

## 2021-04-25 DIAGNOSIS — J449 Chronic obstructive pulmonary disease, unspecified: Secondary | ICD-10-CM | POA: Diagnosis not present

## 2021-04-25 DIAGNOSIS — I872 Venous insufficiency (chronic) (peripheral): Secondary | ICD-10-CM | POA: Diagnosis not present

## 2021-04-25 DIAGNOSIS — L97321 Non-pressure chronic ulcer of left ankle limited to breakdown of skin: Secondary | ICD-10-CM | POA: Diagnosis not present

## 2021-04-26 DIAGNOSIS — J449 Chronic obstructive pulmonary disease, unspecified: Secondary | ICD-10-CM | POA: Diagnosis not present

## 2021-04-26 DIAGNOSIS — I10 Essential (primary) hypertension: Secondary | ICD-10-CM | POA: Diagnosis not present

## 2021-04-26 DIAGNOSIS — I890 Lymphedema, not elsewhere classified: Secondary | ICD-10-CM | POA: Diagnosis not present

## 2021-04-26 DIAGNOSIS — I872 Venous insufficiency (chronic) (peripheral): Secondary | ICD-10-CM | POA: Diagnosis not present

## 2021-04-26 DIAGNOSIS — L97321 Non-pressure chronic ulcer of left ankle limited to breakdown of skin: Secondary | ICD-10-CM | POA: Diagnosis not present

## 2021-04-26 DIAGNOSIS — L97821 Non-pressure chronic ulcer of other part of left lower leg limited to breakdown of skin: Secondary | ICD-10-CM | POA: Diagnosis not present

## 2021-04-27 DIAGNOSIS — I890 Lymphedema, not elsewhere classified: Secondary | ICD-10-CM | POA: Diagnosis not present

## 2021-04-27 DIAGNOSIS — I872 Venous insufficiency (chronic) (peripheral): Secondary | ICD-10-CM | POA: Diagnosis not present

## 2021-04-27 DIAGNOSIS — L97321 Non-pressure chronic ulcer of left ankle limited to breakdown of skin: Secondary | ICD-10-CM | POA: Diagnosis not present

## 2021-04-27 DIAGNOSIS — L97821 Non-pressure chronic ulcer of other part of left lower leg limited to breakdown of skin: Secondary | ICD-10-CM | POA: Diagnosis not present

## 2021-04-27 DIAGNOSIS — I10 Essential (primary) hypertension: Secondary | ICD-10-CM | POA: Diagnosis not present

## 2021-04-27 DIAGNOSIS — J449 Chronic obstructive pulmonary disease, unspecified: Secondary | ICD-10-CM | POA: Diagnosis not present

## 2021-04-28 DIAGNOSIS — J449 Chronic obstructive pulmonary disease, unspecified: Secondary | ICD-10-CM | POA: Diagnosis not present

## 2021-04-28 DIAGNOSIS — L97821 Non-pressure chronic ulcer of other part of left lower leg limited to breakdown of skin: Secondary | ICD-10-CM | POA: Diagnosis not present

## 2021-04-28 DIAGNOSIS — L97321 Non-pressure chronic ulcer of left ankle limited to breakdown of skin: Secondary | ICD-10-CM | POA: Diagnosis not present

## 2021-04-28 DIAGNOSIS — I872 Venous insufficiency (chronic) (peripheral): Secondary | ICD-10-CM | POA: Diagnosis not present

## 2021-04-28 DIAGNOSIS — I10 Essential (primary) hypertension: Secondary | ICD-10-CM | POA: Diagnosis not present

## 2021-04-28 DIAGNOSIS — I890 Lymphedema, not elsewhere classified: Secondary | ICD-10-CM | POA: Diagnosis not present

## 2021-04-29 DIAGNOSIS — I10 Essential (primary) hypertension: Secondary | ICD-10-CM | POA: Diagnosis not present

## 2021-04-29 DIAGNOSIS — I872 Venous insufficiency (chronic) (peripheral): Secondary | ICD-10-CM | POA: Diagnosis not present

## 2021-04-29 DIAGNOSIS — I890 Lymphedema, not elsewhere classified: Secondary | ICD-10-CM | POA: Diagnosis not present

## 2021-04-29 DIAGNOSIS — L97821 Non-pressure chronic ulcer of other part of left lower leg limited to breakdown of skin: Secondary | ICD-10-CM | POA: Diagnosis not present

## 2021-04-29 DIAGNOSIS — L97321 Non-pressure chronic ulcer of left ankle limited to breakdown of skin: Secondary | ICD-10-CM | POA: Diagnosis not present

## 2021-04-29 DIAGNOSIS — J449 Chronic obstructive pulmonary disease, unspecified: Secondary | ICD-10-CM | POA: Diagnosis not present

## 2021-04-30 DIAGNOSIS — J449 Chronic obstructive pulmonary disease, unspecified: Secondary | ICD-10-CM | POA: Diagnosis not present

## 2021-04-30 DIAGNOSIS — L97821 Non-pressure chronic ulcer of other part of left lower leg limited to breakdown of skin: Secondary | ICD-10-CM | POA: Diagnosis not present

## 2021-04-30 DIAGNOSIS — I872 Venous insufficiency (chronic) (peripheral): Secondary | ICD-10-CM | POA: Diagnosis not present

## 2021-04-30 DIAGNOSIS — L97321 Non-pressure chronic ulcer of left ankle limited to breakdown of skin: Secondary | ICD-10-CM | POA: Diagnosis not present

## 2021-04-30 DIAGNOSIS — I890 Lymphedema, not elsewhere classified: Secondary | ICD-10-CM | POA: Diagnosis not present

## 2021-04-30 DIAGNOSIS — I10 Essential (primary) hypertension: Secondary | ICD-10-CM | POA: Diagnosis not present

## 2021-05-01 DIAGNOSIS — I10 Essential (primary) hypertension: Secondary | ICD-10-CM | POA: Diagnosis not present

## 2021-05-01 DIAGNOSIS — I872 Venous insufficiency (chronic) (peripheral): Secondary | ICD-10-CM | POA: Diagnosis not present

## 2021-05-01 DIAGNOSIS — I890 Lymphedema, not elsewhere classified: Secondary | ICD-10-CM | POA: Diagnosis not present

## 2021-05-01 DIAGNOSIS — J449 Chronic obstructive pulmonary disease, unspecified: Secondary | ICD-10-CM | POA: Diagnosis not present

## 2021-05-01 DIAGNOSIS — L97821 Non-pressure chronic ulcer of other part of left lower leg limited to breakdown of skin: Secondary | ICD-10-CM | POA: Diagnosis not present

## 2021-05-01 DIAGNOSIS — L97321 Non-pressure chronic ulcer of left ankle limited to breakdown of skin: Secondary | ICD-10-CM | POA: Diagnosis not present

## 2021-05-02 DIAGNOSIS — L97821 Non-pressure chronic ulcer of other part of left lower leg limited to breakdown of skin: Secondary | ICD-10-CM | POA: Diagnosis not present

## 2021-05-02 DIAGNOSIS — L97321 Non-pressure chronic ulcer of left ankle limited to breakdown of skin: Secondary | ICD-10-CM | POA: Diagnosis not present

## 2021-05-02 DIAGNOSIS — J449 Chronic obstructive pulmonary disease, unspecified: Secondary | ICD-10-CM | POA: Diagnosis not present

## 2021-05-02 DIAGNOSIS — I10 Essential (primary) hypertension: Secondary | ICD-10-CM | POA: Diagnosis not present

## 2021-05-02 DIAGNOSIS — I872 Venous insufficiency (chronic) (peripheral): Secondary | ICD-10-CM | POA: Diagnosis not present

## 2021-05-02 DIAGNOSIS — I890 Lymphedema, not elsewhere classified: Secondary | ICD-10-CM | POA: Diagnosis not present

## 2021-05-03 DIAGNOSIS — I872 Venous insufficiency (chronic) (peripheral): Secondary | ICD-10-CM | POA: Diagnosis not present

## 2021-05-03 DIAGNOSIS — L97821 Non-pressure chronic ulcer of other part of left lower leg limited to breakdown of skin: Secondary | ICD-10-CM | POA: Diagnosis not present

## 2021-05-03 DIAGNOSIS — I890 Lymphedema, not elsewhere classified: Secondary | ICD-10-CM | POA: Diagnosis not present

## 2021-05-03 DIAGNOSIS — L97321 Non-pressure chronic ulcer of left ankle limited to breakdown of skin: Secondary | ICD-10-CM | POA: Diagnosis not present

## 2021-05-03 DIAGNOSIS — I10 Essential (primary) hypertension: Secondary | ICD-10-CM | POA: Diagnosis not present

## 2021-05-03 DIAGNOSIS — J449 Chronic obstructive pulmonary disease, unspecified: Secondary | ICD-10-CM | POA: Diagnosis not present

## 2021-05-04 DIAGNOSIS — I10 Essential (primary) hypertension: Secondary | ICD-10-CM | POA: Diagnosis not present

## 2021-05-04 DIAGNOSIS — I890 Lymphedema, not elsewhere classified: Secondary | ICD-10-CM | POA: Diagnosis not present

## 2021-05-04 DIAGNOSIS — L97821 Non-pressure chronic ulcer of other part of left lower leg limited to breakdown of skin: Secondary | ICD-10-CM | POA: Diagnosis not present

## 2021-05-04 DIAGNOSIS — J449 Chronic obstructive pulmonary disease, unspecified: Secondary | ICD-10-CM | POA: Diagnosis not present

## 2021-05-04 DIAGNOSIS — I872 Venous insufficiency (chronic) (peripheral): Secondary | ICD-10-CM | POA: Diagnosis not present

## 2021-05-04 DIAGNOSIS — L97321 Non-pressure chronic ulcer of left ankle limited to breakdown of skin: Secondary | ICD-10-CM | POA: Diagnosis not present

## 2021-05-05 DIAGNOSIS — L97321 Non-pressure chronic ulcer of left ankle limited to breakdown of skin: Secondary | ICD-10-CM | POA: Diagnosis not present

## 2021-05-05 DIAGNOSIS — I890 Lymphedema, not elsewhere classified: Secondary | ICD-10-CM | POA: Diagnosis not present

## 2021-05-05 DIAGNOSIS — J449 Chronic obstructive pulmonary disease, unspecified: Secondary | ICD-10-CM | POA: Diagnosis not present

## 2021-05-05 DIAGNOSIS — I872 Venous insufficiency (chronic) (peripheral): Secondary | ICD-10-CM | POA: Diagnosis not present

## 2021-05-05 DIAGNOSIS — I10 Essential (primary) hypertension: Secondary | ICD-10-CM | POA: Diagnosis not present

## 2021-05-05 DIAGNOSIS — L97821 Non-pressure chronic ulcer of other part of left lower leg limited to breakdown of skin: Secondary | ICD-10-CM | POA: Diagnosis not present

## 2021-05-06 DIAGNOSIS — I872 Venous insufficiency (chronic) (peripheral): Secondary | ICD-10-CM | POA: Diagnosis not present

## 2021-05-06 DIAGNOSIS — I10 Essential (primary) hypertension: Secondary | ICD-10-CM | POA: Diagnosis not present

## 2021-05-06 DIAGNOSIS — L97821 Non-pressure chronic ulcer of other part of left lower leg limited to breakdown of skin: Secondary | ICD-10-CM | POA: Diagnosis not present

## 2021-05-06 DIAGNOSIS — I890 Lymphedema, not elsewhere classified: Secondary | ICD-10-CM | POA: Diagnosis not present

## 2021-05-06 DIAGNOSIS — L97321 Non-pressure chronic ulcer of left ankle limited to breakdown of skin: Secondary | ICD-10-CM | POA: Diagnosis not present

## 2021-05-06 DIAGNOSIS — J449 Chronic obstructive pulmonary disease, unspecified: Secondary | ICD-10-CM | POA: Diagnosis not present

## 2021-05-08 DIAGNOSIS — J449 Chronic obstructive pulmonary disease, unspecified: Secondary | ICD-10-CM | POA: Diagnosis not present

## 2021-05-08 DIAGNOSIS — I890 Lymphedema, not elsewhere classified: Secondary | ICD-10-CM | POA: Diagnosis not present

## 2021-05-08 DIAGNOSIS — L97321 Non-pressure chronic ulcer of left ankle limited to breakdown of skin: Secondary | ICD-10-CM | POA: Diagnosis not present

## 2021-05-08 DIAGNOSIS — L97821 Non-pressure chronic ulcer of other part of left lower leg limited to breakdown of skin: Secondary | ICD-10-CM | POA: Diagnosis not present

## 2021-05-08 DIAGNOSIS — I872 Venous insufficiency (chronic) (peripheral): Secondary | ICD-10-CM | POA: Diagnosis not present

## 2021-05-08 DIAGNOSIS — I10 Essential (primary) hypertension: Secondary | ICD-10-CM | POA: Diagnosis not present

## 2021-05-12 DIAGNOSIS — I739 Peripheral vascular disease, unspecified: Secondary | ICD-10-CM | POA: Diagnosis not present

## 2021-05-12 DIAGNOSIS — M79675 Pain in left toe(s): Secondary | ICD-10-CM | POA: Diagnosis not present

## 2021-05-12 DIAGNOSIS — M79674 Pain in right toe(s): Secondary | ICD-10-CM | POA: Diagnosis not present

## 2021-05-12 DIAGNOSIS — B351 Tinea unguium: Secondary | ICD-10-CM | POA: Diagnosis not present

## 2021-05-14 DIAGNOSIS — I872 Venous insufficiency (chronic) (peripheral): Secondary | ICD-10-CM | POA: Insufficient documentation

## 2021-05-14 DIAGNOSIS — L97222 Non-pressure chronic ulcer of left calf with fat layer exposed: Secondary | ICD-10-CM | POA: Insufficient documentation

## 2021-05-14 DIAGNOSIS — S80812A Abrasion, left lower leg, initial encounter: Secondary | ICD-10-CM | POA: Insufficient documentation

## 2021-05-21 DIAGNOSIS — I872 Venous insufficiency (chronic) (peripheral): Secondary | ICD-10-CM | POA: Diagnosis not present

## 2021-05-21 DIAGNOSIS — K219 Gastro-esophageal reflux disease without esophagitis: Secondary | ICD-10-CM | POA: Diagnosis not present

## 2021-05-21 DIAGNOSIS — Z8616 Personal history of COVID-19: Secondary | ICD-10-CM | POA: Diagnosis not present

## 2021-05-21 DIAGNOSIS — I739 Peripheral vascular disease, unspecified: Secondary | ICD-10-CM | POA: Diagnosis not present

## 2021-05-21 DIAGNOSIS — L97311 Non-pressure chronic ulcer of right ankle limited to breakdown of skin: Secondary | ICD-10-CM | POA: Diagnosis not present

## 2021-05-21 DIAGNOSIS — J449 Chronic obstructive pulmonary disease, unspecified: Secondary | ICD-10-CM | POA: Diagnosis not present

## 2021-05-21 DIAGNOSIS — M810 Age-related osteoporosis without current pathological fracture: Secondary | ICD-10-CM | POA: Diagnosis not present

## 2021-05-21 DIAGNOSIS — L97322 Non-pressure chronic ulcer of left ankle with fat layer exposed: Secondary | ICD-10-CM | POA: Diagnosis not present

## 2021-05-21 DIAGNOSIS — L97222 Non-pressure chronic ulcer of left calf with fat layer exposed: Secondary | ICD-10-CM | POA: Diagnosis not present

## 2021-05-21 DIAGNOSIS — I890 Lymphedema, not elsewhere classified: Secondary | ICD-10-CM | POA: Diagnosis not present

## 2021-05-21 DIAGNOSIS — L97321 Non-pressure chronic ulcer of left ankle limited to breakdown of skin: Secondary | ICD-10-CM | POA: Diagnosis not present

## 2021-05-22 DIAGNOSIS — L97909 Non-pressure chronic ulcer of unspecified part of unspecified lower leg with unspecified severity: Secondary | ICD-10-CM | POA: Diagnosis not present

## 2021-05-22 DIAGNOSIS — L0100 Impetigo, unspecified: Secondary | ICD-10-CM | POA: Diagnosis not present

## 2021-06-04 DIAGNOSIS — Z79899 Other long term (current) drug therapy: Secondary | ICD-10-CM | POA: Diagnosis not present

## 2021-06-04 DIAGNOSIS — I890 Lymphedema, not elsewhere classified: Secondary | ICD-10-CM | POA: Diagnosis not present

## 2021-06-04 DIAGNOSIS — S91301D Unspecified open wound, right foot, subsequent encounter: Secondary | ICD-10-CM | POA: Diagnosis not present

## 2021-06-04 DIAGNOSIS — Z7982 Long term (current) use of aspirin: Secondary | ICD-10-CM | POA: Diagnosis not present

## 2021-06-04 DIAGNOSIS — Z87891 Personal history of nicotine dependence: Secondary | ICD-10-CM | POA: Diagnosis not present

## 2021-06-04 DIAGNOSIS — L97222 Non-pressure chronic ulcer of left calf with fat layer exposed: Secondary | ICD-10-CM | POA: Diagnosis not present

## 2021-06-04 DIAGNOSIS — J449 Chronic obstructive pulmonary disease, unspecified: Secondary | ICD-10-CM | POA: Diagnosis not present

## 2021-06-04 DIAGNOSIS — I872 Venous insufficiency (chronic) (peripheral): Secondary | ICD-10-CM | POA: Diagnosis not present

## 2021-06-04 DIAGNOSIS — X58XXXD Exposure to other specified factors, subsequent encounter: Secondary | ICD-10-CM | POA: Diagnosis not present

## 2021-06-04 DIAGNOSIS — I10 Essential (primary) hypertension: Secondary | ICD-10-CM | POA: Diagnosis not present

## 2021-06-04 DIAGNOSIS — S91301A Unspecified open wound, right foot, initial encounter: Secondary | ICD-10-CM | POA: Diagnosis not present

## 2021-06-06 ENCOUNTER — Other Ambulatory Visit: Payer: Self-pay | Admitting: *Deleted

## 2021-06-06 ENCOUNTER — Telehealth: Payer: Self-pay | Admitting: Family Medicine

## 2021-06-06 DIAGNOSIS — I83023 Varicose veins of left lower extremity with ulcer of ankle: Secondary | ICD-10-CM

## 2021-06-06 DIAGNOSIS — L97329 Non-pressure chronic ulcer of left ankle with unspecified severity: Secondary | ICD-10-CM

## 2021-06-06 NOTE — Telephone Encounter (Signed)
  Patient is now enrolled into the Transportation Program.   Daphyne Mcclam DOB: 03-23-1951 MRN: [id number]   RIDER WAIVER AND RELEASE OF LIABILITY  For purposes of improving physical access to our facilities, [facility] is pleased to partner with third parties to provide [facility] patients or other authorized individuals the option of convenient, on-demand ground transportation services (the [place]") through use of the technology service that enables users to request on-demand ground transportation from independent third-party providers.  By opting to use and accept these [employer], I, the undersigned, hereby agree on behalf of myself, and on behalf of any minor child using the Government social research officer for whom I am the parent or legal guardian, as follows:  Government social research officer provided to me are provided by independent third-party transportation providers who are not [employer] or employees and who are unaffiliated with [employer]. [facility] is neither a transportation carrier nor a common or public carrier. [facility] has no control over the quality or safety of the transportation that occurs as a result of the [employer]. [facility] cannot guarantee that any third-party transportation provider will complete any arranged transportation service. [facility] makes no representation, warranty, or guarantee regarding the reliability, timeliness, quality, safety, suitability, or availability of any of the Transport Services or that they will be error free. I fully understand that traveling by vehicle involves risks and dangers of serious bodily injury, including permanent disability, paralysis, and death. I agree, on behalf of myself and on behalf of any minor child using the Transport Services for whom I am the parent or legal guardian, that the entire risk arising out of my use of the [employer] remains solely with me, to the maximum extent permitted under  applicable law. The [employer] are provided "as is" and "as available." [facility] disclaims all representations and warranties, express, implied or statutory, not expressly set out in these terms, including the implied warranties of merchantability and fitness for a particular purpose. I hereby waive and release [facility], its agents, employees, officers, directors, representatives, insurers, attorneys, assigns, successors, subsidiaries, and affiliates from any and all past, present, or future claims, demands, liabilities, actions, causes of action, or suits of any kind directly or indirectly arising from acceptance and use of the [employer]. I further waive and release [facility] and its affiliates from all present and future liability and responsibility for any injury or death to persons or damages to property caused by or related to the use of the [employer]. I have read this Waiver and Release of Liability, and I understand the terms used in it and their legal significance. This Waiver is freely and voluntarily given with the understanding that my right (as well as the right of any minor child for whom I am the parent or legal guardian using the [employer]) to legal recourse against [facility] in connection with the [employer] is knowingly surrendered in return for use of these services.   I attest that I read the consent document to Juventino Slovak, gave Ms. Oscarson the opportunity to ask questions and answered the questions asked (if any). I affirm that Juventino Slovak then provided consent for she's participation in this program.     Cameron Proud

## 2021-06-13 ENCOUNTER — Ambulatory Visit (INDEPENDENT_AMBULATORY_CARE_PROVIDER_SITE_OTHER): Payer: Medicare HMO | Admitting: Physician Assistant

## 2021-06-13 ENCOUNTER — Ambulatory Visit (HOSPITAL_COMMUNITY)
Admission: RE | Admit: 2021-06-13 | Discharge: 2021-06-13 | Disposition: A | Discharge status: Home / Self Care | Payer: Medicare HMO | Source: Ambulatory Visit | Attending: Vascular Surgery | Admitting: Vascular Surgery

## 2021-06-13 ENCOUNTER — Other Ambulatory Visit: Payer: Self-pay

## 2021-06-13 VITALS — BP 158/78 | HR 66 | Temp 98.6°F | Resp 20 | Ht 62.0 in | Wt 132.3 lb

## 2021-06-13 DIAGNOSIS — I83023 Varicose veins of left lower extremity with ulcer of ankle: Secondary | ICD-10-CM

## 2021-06-13 DIAGNOSIS — L97321 Non-pressure chronic ulcer of left ankle limited to breakdown of skin: Secondary | ICD-10-CM | POA: Diagnosis not present

## 2021-06-13 DIAGNOSIS — L97329 Non-pressure chronic ulcer of left ankle with unspecified severity: Secondary | ICD-10-CM

## 2021-06-13 NOTE — Addendum Note (Signed)
Addended by: Ulyses Amor on: 06/13/2021 03:18 PM   Modules accepted: Level of Service

## 2021-06-13 NOTE — Progress Notes (Signed)
[facility]     History of Present Illness  Stacy Case is a 70 y.o. female who presents with chief complaint: swollen leg.  Patient notes, onset of Chronic leg ulcers and episodes of cellulitis with infection has been going on for more than 2 years.  She is followed by a wound care specialist in [place].  She had an episode of cellulitis back in Dec 2021.  The wounds are slowly healing with serial UNA boots.    The patient has had no history of DVT, no history of varicose vein, positive history of  stasis ulcers.  There is no family history of venous disorders.  The patient has  used compression UNA boots.  She states at one time it was suggested she have leg amputation.  She went home and cared for her wounds alone and does not want amputations.  She is fine with UNA boot care.    Past Medical History:  Diagnosis Date   Anxiety    Arthritis    Cellulitis of ankle    COPD (chronic obstructive pulmonary disease) (HCC)    Cystocele    Depression    GERD (gastroesophageal reflux disease)    Headache    hx of migraines   Hyperlipidemia    Hypertension    IBS (irritable bowel syndrome)    Peripheral arterial disease ([place])    legs   Pneumonia    Right inguinal hernia     Past Surgical History:  Procedure Laterality Date   ABDOMINAL AORTAGRAM N/A 11/20/2013   Procedure: ABDOMINAL Maxcine Ham;  Surgeon: Angelia Mould, MD;  Location: [facility] CATH [facility];  Service: Cardiovascular;  Laterality: N/A;   APPENDECTOMY     JOINT REPLACEMENT     Left hip 2018   TOTAL HIP ARTHROPLASTY Right 02/28/2020   Procedure: TOTAL HIP ARTHROPLASTY ANTERIOR APPROACH;  Surgeon: Gaynelle Arabian, MD;  Location: [facility] ORS;  Service: Orthopedics;  Laterality: Right;  152mn   WRIST SURGERY      Social History   Socioeconomic History   Marital status: Single    Spouse name: Not on file   Number of children: 3   Years of education: Not on file   Highest education level: Not on file   Occupational History   Not on file  Tobacco Use   Smoking status: Former    Packs/day: 1.00    Years: 40.00    Pack years: 40.00    Types: Cigarettes    Quit date: 02/20/2017    Years since quitting: 4.3   Smokeless tobacco: Never  Vaping Use   Vaping Use: Every day   Substances: Nicotine, Flavoring  Substance and Sexual Activity   Alcohol use: No   Drug use: No   Sexual activity: Not Currently  Other Topics Concern   Not on file  Social History Narrative   Not on file   Social Determinants of Health   Financial Resource Strain: Not on file  Food Insecurity: Not on file  Transportation Needs: Not on file  Physical Activity: Not on file  Stress: Not on file  Social Connections: Not on file  Intimate Partner Violence: Not on file    Family History  Problem Relation Age of Onset   Varicose Veins Mother    Clotting disorder Mother    Diabetes Brother    Hypertension Brother    Diabetes Daughter    Hyperlipidemia Daughter    Hypertension Daughter     Current Outpatient Medications  on File Prior to Visit  Medication Sig Dispense Refill   alendronate (FOSAMAX) 70 MG tablet Take 70 mg by mouth every 7 (seven) days.     ascorbic acid (VITAMIN C) 500 MG tablet Take 500 mg by mouth daily.     atenolol (TENORMIN) 50 MG tablet Take 50 mg by mouth daily.     atorvastatin (LIPITOR) 40 MG tablet Take 40 mg by mouth daily.     Cholecalciferol (VITAMIN D3) 10 MCG (400 UNIT) CAPS Take 400 Units by mouth daily.     dicyclomine (BENTYL) 20 MG tablet Take 20 mg by mouth 4 (four) times daily - after meals and at bedtime.     diltiazem (TIAZAC) 240 MG 24 hr capsule Take 240 mg by mouth daily.     furosemide (LASIX) 20 MG tablet Take 20 mg by mouth daily.     HYDROcodone-acetaminophen (NORCO/VICODIN) 5-325 MG tablet Take 1-2 tablets by mouth every 6 (six) hours as needed for moderate pain (pain score 4-6). 56 tablet 0   iron polysaccharides (NIFEREX) 150 MG capsule Take 150 mg by mouth  2 (two) times daily.     loratadine (CLARITIN) 10 MG tablet Take 10 mg by mouth daily.     methocarbamol (ROBAXIN) 500 MG tablet Take 1 tablet (500 mg total) by mouth every 6 (six) hours as needed for muscle spasms. 40 tablet 0   mirtazapine (REMERON) 30 MG tablet Take 30 mg by mouth at bedtime.     Multiple Vitamin (MULTIVITAMIN WITH MINERALS) TABS tablet Take 1 tablet by mouth daily.     ondansetron (ZOFRAN) 8 MG tablet Take 8 mg by mouth every 8 (eight) hours as needed for nausea or vomiting.     pantoprazole (PROTONIX) 40 MG tablet Take 40 mg by mouth daily.     silver sulfADIAZINE (SILVADENE) 1 % cream Apply 1 application topically daily as needed (irritation).     traMADol (ULTRAM) 50 MG tablet Take 100 mg by mouth every 6 (six) hours as needed for moderate pain.     No current facility-administered medications on file prior to visit.    Allergies as of 06/13/2021 - Review Complete 06/13/2021  Allergen Reaction Noted   Penicillamine Rash and Swelling 07/02/2020   Clindamycin Other (See Comments) 07/02/2020   Penicillins Rash 11/15/2013     ROS:   General:  No weight loss, Fever, chills  HEENT: No recent headaches, no nasal bleeding, no visual changes, no sore throat  Neurologic: No dizziness, blackouts, seizures. No recent symptoms of stroke or mini- stroke. No recent episodes of slurred speech, or temporary blindness.  Cardiac: No recent episodes of chest pain/pressure, no shortness of breath at rest.  No shortness of breath with exertion.  Denies history of atrial fibrillation or irregular heartbeat  Vascular: No history of rest pain in feet.  No history of claudication.  Positive chronic history of non-healing ulcer, No history of DVT   Pulmonary: No home oxygen, no productive cough, no hemoptysis,  No asthma or wheezing  Musculoskeletal:  '[ ]'$  Arthritis, '[ ]'$  Low back pain,  '[ ]'$  Joint pain  Hematologic:No history of hypercoagulable state.  No history of easy bleeding.   positve history of anemia  Gastrointestinal: No hematochezia or melena,  No gastroesophageal reflux, no trouble swallowing  Urinary: '[ ]'$  chronic Kidney disease, '[ ]'$  on HD - '[ ]'$  MWF or '[ ]'$  TTHS, '[ ]'$  Burning with urination, '[ ]'$  Frequent urination, '[ ]'$  Difficulty urinating;   Skin:  No rashes  Psychological: No history of anxiety,  No history of depression  Physical Examination  Vitals:   06/13/21 1412  BP: (!) 158/78  Pulse: 66  Resp: 20  Temp: 98.6 F (37 C)  TempSrc: Temporal  SpO2: 95%  Weight: 132 lb 4.8 oz (60 kg)  Height: '5\' 2"'$  (1.575 m)    Body mass index is 24.2 kg/m.  General:  Alert and oriented, no acute distress HEENT: Normal Neck: No bruit or JVD Pulmonary: Clear to auscultation bilaterally Cardiac: Regular Rate and Rhythm without murmur Abdomen: Soft, non-tender, non-distended, no mass, no scars Skin: No rash     Extremity Pulses:  2+ radial, brachial, femoral, dorsalis pedis, posterior tibial pulses bilaterally Musculoskeletal: No deformity or edema  Neurologic: Upper and lower extremity motor 5/5 and symmetric  DATA:    Venous Reflux Times  +--------------+---------+------+-----------+------------+-----------------  --+  RIGHT         Reflux NoRefluxReflux TimeDiameter cmsComments                                      Yes                                               +--------------+---------+------+-----------+------------+-----------------  --+  CFV                     yes   >1 second                                   +--------------+---------+------+-----------+------------+-----------------  --+  FV mid        no                                                          +--------------+---------+------+-----------+------------+-----------------  --+  Popliteal     no                                                          +--------------+---------+------+-----------+------------+-----------------  --+   GSV at [facility]    no                            0.34                          +--------------+---------+------+-----------+------------+-----------------  --+  GSV prox thighno                            0.33    Enlarged lymph  node  +--------------+---------+------+-----------+------------+-----------------  --+  GSV mid thigh no                            0.19                          +--------------+---------+------+-----------+------------+-----------------  --+  GSV dist thighno                            0.14                          +--------------+---------+------+-----------+------------+-----------------  --+  GSV at knee   no                            0.15                          +--------------+---------+------+-----------+------------+-----------------  --+  GSV prox calf no                            0.15                          +--------------+---------+------+-----------+------------+-----------------  --+  GSV mid calf                                        NV                    +--------------+---------+------+-----------+------------+-----------------  --+  SSV Pop Fossa no                            0.16                          +--------------+---------+------+-----------+------------+-----------------  --+  SSV prox calf                                       NV                    +--------------+---------+------+-----------+------------+-----------------  --+  SSV mid calf  no                            0.14                          +--------------+---------+------+-----------+------------+-----------------  --+      +--------------+---------+------+-----------+------------+-----------------  --+  LEFT          Reflux NoRefluxReflux TimeDiameter cmsComments                                      Yes                                                +--------------+---------+------+-----------+------------+-----------------  --+  CFV                     yes   >1 second             Enlarged lymph  node  +--------------+---------+------+-----------+------------+-----------------  --+  FV mid  no                                                          +--------------+---------+------+-----------+------------+-----------------  --+  Popliteal     no                                                          +--------------+---------+------+-----------+------------+-----------------  --+  GSV at SFJ              yes    >500 ms      0.53                          +--------------+---------+------+-----------+------------+-----------------  --+  GSV prox thighno                            0.38                          +--------------+---------+------+-----------+------------+-----------------  --+  GSV mid thigh           yes    >500 ms      0.18                          +--------------+---------+------+-----------+------------+-----------------  --+  GSV dist thighno                            0.30                          +--------------+---------+------+-----------+------------+-----------------  --+  GSV at knee   no                            0.21                          +--------------+---------+------+-----------+------------+-----------------  --+  GSV prox calf no                            0.15                          +--------------+---------+------+-----------+------------+-----------------  --+  GSV mid calf  no                            0.20                          +--------------+---------+------+-----------+------------+-----------------  --+  SSV Pop Fossa no                            0.29                          +--------------+---------+------+-----------+------------+-----------------  --+  SSV prox calf no                             0.23                          +--------------+---------+------+-----------+------------+-----------------  --+  SSV mid calf  no                            0.24                          +--------------+---------+------+-----------+------------+-----------------  --+     Assessment:  Venous insufficieny with chronic venous ulcers and bouts of cellulitis She has mixed venous reflux in the left LE both deep and superficial.  The GSV below the SFJ is not large enough to warrant intervention such as laser ablation.  Her deep system has reflux and this can not be intervening on.  Elevation and continued skin care with una boots is the best course of action.  She has good arterial flow with bris DP/Peroneal doppler signals.    Right:  - No evidence of deep vein thrombosis seen in the right lower extremity,  from the common femoral through the popliteal veins.  - No evidence of superficial venous reflux seen in the right greater  saphenous vein.  - No evidence of superficial venous reflux seen in the right short  saphenous vein.     Left:  - No evidence of deep vein thrombosis seen in the left lower extremity,  from the common femoral through the popliteal veins.  - No evidence of superficial venous reflux seen in the left short  saphenous vein.  - Venous reflux is noted in the left common femoral vein.  - Venous reflux is noted in the left sapheno-femoral junction.  - Venous reflux is noted in the left greater saphenous vein in the thigh.    Plan:F/U PRN.    Roxy Horseman PA-C [facility] Office: [phone]  MD in clinic [place]

## 2021-06-18 DIAGNOSIS — J449 Chronic obstructive pulmonary disease, unspecified: Secondary | ICD-10-CM | POA: Diagnosis not present

## 2021-06-18 DIAGNOSIS — J309 Allergic rhinitis, unspecified: Secondary | ICD-10-CM | POA: Diagnosis not present

## 2021-06-18 DIAGNOSIS — S91301A Unspecified open wound, right foot, initial encounter: Secondary | ICD-10-CM | POA: Diagnosis not present

## 2021-06-18 DIAGNOSIS — I872 Venous insufficiency (chronic) (peripheral): Secondary | ICD-10-CM | POA: Diagnosis not present

## 2021-06-18 DIAGNOSIS — R32 Unspecified urinary incontinence: Secondary | ICD-10-CM | POA: Diagnosis not present

## 2021-06-18 DIAGNOSIS — M1990 Unspecified osteoarthritis, unspecified site: Secondary | ICD-10-CM | POA: Diagnosis not present

## 2021-06-18 DIAGNOSIS — R69 Illness, unspecified: Secondary | ICD-10-CM | POA: Diagnosis not present

## 2021-06-18 DIAGNOSIS — X58XXXD Exposure to other specified factors, subsequent encounter: Secondary | ICD-10-CM | POA: Diagnosis not present

## 2021-06-18 DIAGNOSIS — Z7982 Long term (current) use of aspirin: Secondary | ICD-10-CM | POA: Diagnosis not present

## 2021-06-18 DIAGNOSIS — K219 Gastro-esophageal reflux disease without esophagitis: Secondary | ICD-10-CM | POA: Diagnosis not present

## 2021-06-18 DIAGNOSIS — L97901 Non-pressure chronic ulcer of unspecified part of unspecified lower leg limited to breakdown of skin: Secondary | ICD-10-CM | POA: Diagnosis not present

## 2021-06-18 DIAGNOSIS — R600 Localized edema: Secondary | ICD-10-CM | POA: Diagnosis not present

## 2021-06-18 DIAGNOSIS — I10 Essential (primary) hypertension: Secondary | ICD-10-CM | POA: Diagnosis not present

## 2021-06-18 DIAGNOSIS — I890 Lymphedema, not elsewhere classified: Secondary | ICD-10-CM | POA: Diagnosis not present

## 2021-06-18 DIAGNOSIS — L97322 Non-pressure chronic ulcer of left ankle with fat layer exposed: Secondary | ICD-10-CM | POA: Diagnosis not present

## 2021-06-18 DIAGNOSIS — Z87891 Personal history of nicotine dependence: Secondary | ICD-10-CM | POA: Diagnosis not present

## 2021-06-18 DIAGNOSIS — L97222 Non-pressure chronic ulcer of left calf with fat layer exposed: Secondary | ICD-10-CM | POA: Diagnosis not present

## 2021-06-18 DIAGNOSIS — G8929 Other chronic pain: Secondary | ICD-10-CM | POA: Diagnosis not present

## 2021-06-18 DIAGNOSIS — Z79899 Other long term (current) drug therapy: Secondary | ICD-10-CM | POA: Diagnosis not present

## 2021-06-18 DIAGNOSIS — S91301D Unspecified open wound, right foot, subsequent encounter: Secondary | ICD-10-CM | POA: Diagnosis not present

## 2021-06-26 DIAGNOSIS — L0100 Impetigo, unspecified: Secondary | ICD-10-CM | POA: Diagnosis not present

## 2021-06-26 DIAGNOSIS — L280 Lichen simplex chronicus: Secondary | ICD-10-CM | POA: Diagnosis not present

## 2021-06-30 DIAGNOSIS — I872 Venous insufficiency (chronic) (peripheral): Secondary | ICD-10-CM | POA: Diagnosis not present

## 2021-06-30 DIAGNOSIS — R69 Illness, unspecified: Secondary | ICD-10-CM | POA: Diagnosis not present

## 2021-06-30 DIAGNOSIS — I890 Lymphedema, not elsewhere classified: Secondary | ICD-10-CM | POA: Diagnosis not present

## 2021-06-30 DIAGNOSIS — J449 Chronic obstructive pulmonary disease, unspecified: Secondary | ICD-10-CM | POA: Diagnosis not present

## 2021-06-30 DIAGNOSIS — L97322 Non-pressure chronic ulcer of left ankle with fat layer exposed: Secondary | ICD-10-CM | POA: Diagnosis not present

## 2021-06-30 DIAGNOSIS — I739 Peripheral vascular disease, unspecified: Secondary | ICD-10-CM | POA: Diagnosis not present

## 2021-06-30 DIAGNOSIS — L97922 Non-pressure chronic ulcer of unspecified part of left lower leg with fat layer exposed: Secondary | ICD-10-CM | POA: Diagnosis not present

## 2021-06-30 DIAGNOSIS — L97222 Non-pressure chronic ulcer of left calf with fat layer exposed: Secondary | ICD-10-CM | POA: Diagnosis not present

## 2021-06-30 DIAGNOSIS — U071 COVID-19: Secondary | ICD-10-CM | POA: Diagnosis not present

## 2021-06-30 DIAGNOSIS — I10 Essential (primary) hypertension: Secondary | ICD-10-CM | POA: Diagnosis not present

## 2021-07-02 DIAGNOSIS — Z79899 Other long term (current) drug therapy: Secondary | ICD-10-CM | POA: Diagnosis not present

## 2021-07-02 DIAGNOSIS — I10 Essential (primary) hypertension: Secondary | ICD-10-CM | POA: Diagnosis not present

## 2021-07-02 DIAGNOSIS — L97919 Non-pressure chronic ulcer of unspecified part of right lower leg with unspecified severity: Secondary | ICD-10-CM | POA: Diagnosis not present

## 2021-07-02 DIAGNOSIS — L97222 Non-pressure chronic ulcer of left calf with fat layer exposed: Secondary | ICD-10-CM | POA: Diagnosis not present

## 2021-07-02 DIAGNOSIS — L97322 Non-pressure chronic ulcer of left ankle with fat layer exposed: Secondary | ICD-10-CM | POA: Diagnosis not present

## 2021-07-02 DIAGNOSIS — I739 Peripheral vascular disease, unspecified: Secondary | ICD-10-CM | POA: Diagnosis not present

## 2021-07-02 DIAGNOSIS — Z7982 Long term (current) use of aspirin: Secondary | ICD-10-CM | POA: Diagnosis not present

## 2021-07-02 DIAGNOSIS — Z96643 Presence of artificial hip joint, bilateral: Secondary | ICD-10-CM | POA: Diagnosis not present

## 2021-07-02 DIAGNOSIS — I83022 Varicose veins of left lower extremity with ulcer of calf: Secondary | ICD-10-CM | POA: Diagnosis not present

## 2021-07-02 DIAGNOSIS — I83019 Varicose veins of right lower extremity with ulcer of unspecified site: Secondary | ICD-10-CM | POA: Diagnosis not present

## 2021-07-02 DIAGNOSIS — M810 Age-related osteoporosis without current pathological fracture: Secondary | ICD-10-CM | POA: Diagnosis not present

## 2021-07-02 DIAGNOSIS — Z8616 Personal history of COVID-19: Secondary | ICD-10-CM | POA: Diagnosis not present

## 2021-07-02 DIAGNOSIS — I890 Lymphedema, not elsewhere classified: Secondary | ICD-10-CM | POA: Diagnosis not present

## 2021-07-02 DIAGNOSIS — K219 Gastro-esophageal reflux disease without esophagitis: Secondary | ICD-10-CM | POA: Diagnosis not present

## 2021-07-02 DIAGNOSIS — I83023 Varicose veins of left lower extremity with ulcer of ankle: Secondary | ICD-10-CM | POA: Diagnosis not present

## 2021-07-02 DIAGNOSIS — I872 Venous insufficiency (chronic) (peripheral): Secondary | ICD-10-CM | POA: Diagnosis not present

## 2021-07-02 DIAGNOSIS — Z87891 Personal history of nicotine dependence: Secondary | ICD-10-CM | POA: Diagnosis not present

## 2021-07-02 DIAGNOSIS — J449 Chronic obstructive pulmonary disease, unspecified: Secondary | ICD-10-CM | POA: Diagnosis not present

## 2021-07-03 DIAGNOSIS — J449 Chronic obstructive pulmonary disease, unspecified: Secondary | ICD-10-CM | POA: Diagnosis not present

## 2021-07-03 DIAGNOSIS — I10 Essential (primary) hypertension: Secondary | ICD-10-CM | POA: Diagnosis not present

## 2021-07-03 DIAGNOSIS — L97322 Non-pressure chronic ulcer of left ankle with fat layer exposed: Secondary | ICD-10-CM | POA: Diagnosis not present

## 2021-07-03 DIAGNOSIS — L97222 Non-pressure chronic ulcer of left calf with fat layer exposed: Secondary | ICD-10-CM | POA: Diagnosis not present

## 2021-07-03 DIAGNOSIS — R69 Illness, unspecified: Secondary | ICD-10-CM | POA: Diagnosis not present

## 2021-07-03 DIAGNOSIS — I872 Venous insufficiency (chronic) (peripheral): Secondary | ICD-10-CM | POA: Diagnosis not present

## 2021-07-03 DIAGNOSIS — I739 Peripheral vascular disease, unspecified: Secondary | ICD-10-CM | POA: Diagnosis not present

## 2021-07-03 DIAGNOSIS — U071 COVID-19: Secondary | ICD-10-CM | POA: Diagnosis not present

## 2021-07-03 DIAGNOSIS — I890 Lymphedema, not elsewhere classified: Secondary | ICD-10-CM | POA: Diagnosis not present

## 2021-07-06 DIAGNOSIS — I890 Lymphedema, not elsewhere classified: Secondary | ICD-10-CM | POA: Diagnosis not present

## 2021-07-06 DIAGNOSIS — I10 Essential (primary) hypertension: Secondary | ICD-10-CM | POA: Diagnosis not present

## 2021-07-06 DIAGNOSIS — R69 Illness, unspecified: Secondary | ICD-10-CM | POA: Diagnosis not present

## 2021-07-06 DIAGNOSIS — I739 Peripheral vascular disease, unspecified: Secondary | ICD-10-CM | POA: Diagnosis not present

## 2021-07-06 DIAGNOSIS — L97322 Non-pressure chronic ulcer of left ankle with fat layer exposed: Secondary | ICD-10-CM | POA: Diagnosis not present

## 2021-07-06 DIAGNOSIS — U071 COVID-19: Secondary | ICD-10-CM | POA: Diagnosis not present

## 2021-07-06 DIAGNOSIS — J449 Chronic obstructive pulmonary disease, unspecified: Secondary | ICD-10-CM | POA: Diagnosis not present

## 2021-07-06 DIAGNOSIS — I872 Venous insufficiency (chronic) (peripheral): Secondary | ICD-10-CM | POA: Diagnosis not present

## 2021-07-06 DIAGNOSIS — L97222 Non-pressure chronic ulcer of left calf with fat layer exposed: Secondary | ICD-10-CM | POA: Diagnosis not present

## 2021-07-09 DIAGNOSIS — R5383 Other fatigue: Secondary | ICD-10-CM | POA: Diagnosis not present

## 2021-07-09 DIAGNOSIS — N183 Chronic kidney disease, stage 3 unspecified: Secondary | ICD-10-CM | POA: Diagnosis not present

## 2021-07-09 DIAGNOSIS — Z1329 Encounter for screening for other suspected endocrine disorder: Secondary | ICD-10-CM | POA: Diagnosis not present

## 2021-07-09 DIAGNOSIS — E7849 Other hyperlipidemia: Secondary | ICD-10-CM | POA: Diagnosis not present

## 2021-07-09 DIAGNOSIS — E782 Mixed hyperlipidemia: Secondary | ICD-10-CM | POA: Diagnosis not present

## 2021-07-09 DIAGNOSIS — D529 Folate deficiency anemia, unspecified: Secondary | ICD-10-CM | POA: Diagnosis not present

## 2021-07-09 DIAGNOSIS — R7301 Impaired fasting glucose: Secondary | ICD-10-CM | POA: Diagnosis not present

## 2021-07-09 DIAGNOSIS — K2100 Gastro-esophageal reflux disease with esophagitis, without bleeding: Secondary | ICD-10-CM | POA: Diagnosis not present

## 2021-07-09 DIAGNOSIS — D649 Anemia, unspecified: Secondary | ICD-10-CM | POA: Diagnosis not present

## 2021-07-09 DIAGNOSIS — D519 Vitamin B12 deficiency anemia, unspecified: Secondary | ICD-10-CM | POA: Diagnosis not present

## 2021-07-10 DIAGNOSIS — J449 Chronic obstructive pulmonary disease, unspecified: Secondary | ICD-10-CM | POA: Diagnosis not present

## 2021-07-10 DIAGNOSIS — I10 Essential (primary) hypertension: Secondary | ICD-10-CM | POA: Diagnosis not present

## 2021-07-10 DIAGNOSIS — I739 Peripheral vascular disease, unspecified: Secondary | ICD-10-CM | POA: Diagnosis not present

## 2021-07-10 DIAGNOSIS — I890 Lymphedema, not elsewhere classified: Secondary | ICD-10-CM | POA: Diagnosis not present

## 2021-07-10 DIAGNOSIS — U071 COVID-19: Secondary | ICD-10-CM | POA: Diagnosis not present

## 2021-07-10 DIAGNOSIS — L97222 Non-pressure chronic ulcer of left calf with fat layer exposed: Secondary | ICD-10-CM | POA: Diagnosis not present

## 2021-07-10 DIAGNOSIS — L97322 Non-pressure chronic ulcer of left ankle with fat layer exposed: Secondary | ICD-10-CM | POA: Diagnosis not present

## 2021-07-10 DIAGNOSIS — I872 Venous insufficiency (chronic) (peripheral): Secondary | ICD-10-CM | POA: Diagnosis not present

## 2021-07-10 DIAGNOSIS — R69 Illness, unspecified: Secondary | ICD-10-CM | POA: Diagnosis not present

## 2021-07-14 DIAGNOSIS — D500 Iron deficiency anemia secondary to blood loss (chronic): Secondary | ICD-10-CM | POA: Diagnosis not present

## 2021-07-14 DIAGNOSIS — I739 Peripheral vascular disease, unspecified: Secondary | ICD-10-CM | POA: Diagnosis not present

## 2021-07-14 DIAGNOSIS — I872 Venous insufficiency (chronic) (peripheral): Secondary | ICD-10-CM | POA: Diagnosis not present

## 2021-07-14 DIAGNOSIS — U071 COVID-19: Secondary | ICD-10-CM | POA: Diagnosis not present

## 2021-07-14 DIAGNOSIS — R69 Illness, unspecified: Secondary | ICD-10-CM | POA: Diagnosis not present

## 2021-07-14 DIAGNOSIS — J449 Chronic obstructive pulmonary disease, unspecified: Secondary | ICD-10-CM | POA: Diagnosis not present

## 2021-07-14 DIAGNOSIS — L97222 Non-pressure chronic ulcer of left calf with fat layer exposed: Secondary | ICD-10-CM | POA: Diagnosis not present

## 2021-07-14 DIAGNOSIS — L97322 Non-pressure chronic ulcer of left ankle with fat layer exposed: Secondary | ICD-10-CM | POA: Diagnosis not present

## 2021-07-14 DIAGNOSIS — R4582 Worries: Secondary | ICD-10-CM | POA: Diagnosis not present

## 2021-07-14 DIAGNOSIS — I10 Essential (primary) hypertension: Secondary | ICD-10-CM | POA: Diagnosis not present

## 2021-07-14 DIAGNOSIS — E7849 Other hyperlipidemia: Secondary | ICD-10-CM | POA: Diagnosis not present

## 2021-07-14 DIAGNOSIS — I890 Lymphedema, not elsewhere classified: Secondary | ICD-10-CM | POA: Diagnosis not present

## 2021-07-14 DIAGNOSIS — Z0001 Encounter for general adult medical examination with abnormal findings: Secondary | ICD-10-CM | POA: Diagnosis not present

## 2021-07-17 DIAGNOSIS — L280 Lichen simplex chronicus: Secondary | ICD-10-CM | POA: Diagnosis not present

## 2021-07-17 DIAGNOSIS — R69 Illness, unspecified: Secondary | ICD-10-CM | POA: Diagnosis not present

## 2021-07-17 DIAGNOSIS — I872 Venous insufficiency (chronic) (peripheral): Secondary | ICD-10-CM | POA: Diagnosis not present

## 2021-07-17 DIAGNOSIS — J449 Chronic obstructive pulmonary disease, unspecified: Secondary | ICD-10-CM | POA: Diagnosis not present

## 2021-07-17 DIAGNOSIS — I890 Lymphedema, not elsewhere classified: Secondary | ICD-10-CM | POA: Diagnosis not present

## 2021-07-17 DIAGNOSIS — U071 COVID-19: Secondary | ICD-10-CM | POA: Diagnosis not present

## 2021-07-17 DIAGNOSIS — I739 Peripheral vascular disease, unspecified: Secondary | ICD-10-CM | POA: Diagnosis not present

## 2021-07-17 DIAGNOSIS — L97222 Non-pressure chronic ulcer of left calf with fat layer exposed: Secondary | ICD-10-CM | POA: Diagnosis not present

## 2021-07-17 DIAGNOSIS — L97322 Non-pressure chronic ulcer of left ankle with fat layer exposed: Secondary | ICD-10-CM | POA: Diagnosis not present

## 2021-07-17 DIAGNOSIS — I10 Essential (primary) hypertension: Secondary | ICD-10-CM | POA: Diagnosis not present

## 2021-07-17 DIAGNOSIS — L570 Actinic keratosis: Secondary | ICD-10-CM | POA: Diagnosis not present

## 2021-07-22 DIAGNOSIS — I739 Peripheral vascular disease, unspecified: Secondary | ICD-10-CM | POA: Diagnosis not present

## 2021-07-22 DIAGNOSIS — L97322 Non-pressure chronic ulcer of left ankle with fat layer exposed: Secondary | ICD-10-CM | POA: Diagnosis not present

## 2021-07-22 DIAGNOSIS — U071 COVID-19: Secondary | ICD-10-CM | POA: Diagnosis not present

## 2021-07-22 DIAGNOSIS — I10 Essential (primary) hypertension: Secondary | ICD-10-CM | POA: Diagnosis not present

## 2021-07-22 DIAGNOSIS — I890 Lymphedema, not elsewhere classified: Secondary | ICD-10-CM | POA: Diagnosis not present

## 2021-07-22 DIAGNOSIS — L97222 Non-pressure chronic ulcer of left calf with fat layer exposed: Secondary | ICD-10-CM | POA: Diagnosis not present

## 2021-07-22 DIAGNOSIS — I872 Venous insufficiency (chronic) (peripheral): Secondary | ICD-10-CM | POA: Diagnosis not present

## 2021-07-22 DIAGNOSIS — R69 Illness, unspecified: Secondary | ICD-10-CM | POA: Diagnosis not present

## 2021-07-22 DIAGNOSIS — J449 Chronic obstructive pulmonary disease, unspecified: Secondary | ICD-10-CM | POA: Diagnosis not present

## 2021-07-23 DIAGNOSIS — I83022 Varicose veins of left lower extremity with ulcer of calf: Secondary | ICD-10-CM | POA: Diagnosis not present

## 2021-07-23 DIAGNOSIS — I83023 Varicose veins of left lower extremity with ulcer of ankle: Secondary | ICD-10-CM | POA: Diagnosis not present

## 2021-07-23 DIAGNOSIS — Z8616 Personal history of COVID-19: Secondary | ICD-10-CM | POA: Diagnosis not present

## 2021-07-23 DIAGNOSIS — Z7982 Long term (current) use of aspirin: Secondary | ICD-10-CM | POA: Diagnosis not present

## 2021-07-23 DIAGNOSIS — L97222 Non-pressure chronic ulcer of left calf with fat layer exposed: Secondary | ICD-10-CM | POA: Diagnosis not present

## 2021-07-23 DIAGNOSIS — Z96643 Presence of artificial hip joint, bilateral: Secondary | ICD-10-CM | POA: Diagnosis not present

## 2021-07-23 DIAGNOSIS — I10 Essential (primary) hypertension: Secondary | ICD-10-CM | POA: Diagnosis not present

## 2021-07-23 DIAGNOSIS — I739 Peripheral vascular disease, unspecified: Secondary | ICD-10-CM | POA: Diagnosis not present

## 2021-07-23 DIAGNOSIS — K219 Gastro-esophageal reflux disease without esophagitis: Secondary | ICD-10-CM | POA: Diagnosis not present

## 2021-07-23 DIAGNOSIS — I890 Lymphedema, not elsewhere classified: Secondary | ICD-10-CM | POA: Diagnosis not present

## 2021-07-23 DIAGNOSIS — Z87891 Personal history of nicotine dependence: Secondary | ICD-10-CM | POA: Diagnosis not present

## 2021-07-23 DIAGNOSIS — Z79899 Other long term (current) drug therapy: Secondary | ICD-10-CM | POA: Diagnosis not present

## 2021-07-23 DIAGNOSIS — I872 Venous insufficiency (chronic) (peripheral): Secondary | ICD-10-CM | POA: Diagnosis not present

## 2021-07-23 DIAGNOSIS — J449 Chronic obstructive pulmonary disease, unspecified: Secondary | ICD-10-CM | POA: Diagnosis not present

## 2021-07-23 DIAGNOSIS — M810 Age-related osteoporosis without current pathological fracture: Secondary | ICD-10-CM | POA: Diagnosis not present

## 2021-07-23 DIAGNOSIS — L97322 Non-pressure chronic ulcer of left ankle with fat layer exposed: Secondary | ICD-10-CM | POA: Diagnosis not present

## 2021-07-25 DIAGNOSIS — J449 Chronic obstructive pulmonary disease, unspecified: Secondary | ICD-10-CM | POA: Diagnosis not present

## 2021-07-25 DIAGNOSIS — L97222 Non-pressure chronic ulcer of left calf with fat layer exposed: Secondary | ICD-10-CM | POA: Diagnosis not present

## 2021-07-25 DIAGNOSIS — I739 Peripheral vascular disease, unspecified: Secondary | ICD-10-CM | POA: Diagnosis not present

## 2021-07-25 DIAGNOSIS — R69 Illness, unspecified: Secondary | ICD-10-CM | POA: Diagnosis not present

## 2021-07-25 DIAGNOSIS — I890 Lymphedema, not elsewhere classified: Secondary | ICD-10-CM | POA: Diagnosis not present

## 2021-07-25 DIAGNOSIS — U071 COVID-19: Secondary | ICD-10-CM | POA: Diagnosis not present

## 2021-07-25 DIAGNOSIS — I10 Essential (primary) hypertension: Secondary | ICD-10-CM | POA: Diagnosis not present

## 2021-07-25 DIAGNOSIS — L97322 Non-pressure chronic ulcer of left ankle with fat layer exposed: Secondary | ICD-10-CM | POA: Diagnosis not present

## 2021-07-25 DIAGNOSIS — I872 Venous insufficiency (chronic) (peripheral): Secondary | ICD-10-CM | POA: Diagnosis not present

## 2021-07-28 DIAGNOSIS — U071 COVID-19: Secondary | ICD-10-CM | POA: Diagnosis not present

## 2021-07-28 DIAGNOSIS — I10 Essential (primary) hypertension: Secondary | ICD-10-CM | POA: Diagnosis not present

## 2021-07-28 DIAGNOSIS — I739 Peripheral vascular disease, unspecified: Secondary | ICD-10-CM | POA: Diagnosis not present

## 2021-07-28 DIAGNOSIS — L97222 Non-pressure chronic ulcer of left calf with fat layer exposed: Secondary | ICD-10-CM | POA: Diagnosis not present

## 2021-07-28 DIAGNOSIS — M79675 Pain in left toe(s): Secondary | ICD-10-CM | POA: Diagnosis not present

## 2021-07-28 DIAGNOSIS — B351 Tinea unguium: Secondary | ICD-10-CM | POA: Diagnosis not present

## 2021-07-28 DIAGNOSIS — I872 Venous insufficiency (chronic) (peripheral): Secondary | ICD-10-CM | POA: Diagnosis not present

## 2021-07-28 DIAGNOSIS — J449 Chronic obstructive pulmonary disease, unspecified: Secondary | ICD-10-CM | POA: Diagnosis not present

## 2021-07-28 DIAGNOSIS — L97322 Non-pressure chronic ulcer of left ankle with fat layer exposed: Secondary | ICD-10-CM | POA: Diagnosis not present

## 2021-07-28 DIAGNOSIS — I890 Lymphedema, not elsewhere classified: Secondary | ICD-10-CM | POA: Diagnosis not present

## 2021-07-28 DIAGNOSIS — M79674 Pain in right toe(s): Secondary | ICD-10-CM | POA: Diagnosis not present

## 2021-07-28 DIAGNOSIS — R69 Illness, unspecified: Secondary | ICD-10-CM | POA: Diagnosis not present

## 2021-07-31 DIAGNOSIS — L97322 Non-pressure chronic ulcer of left ankle with fat layer exposed: Secondary | ICD-10-CM | POA: Diagnosis not present

## 2021-07-31 DIAGNOSIS — I890 Lymphedema, not elsewhere classified: Secondary | ICD-10-CM | POA: Diagnosis not present

## 2021-07-31 DIAGNOSIS — U071 COVID-19: Secondary | ICD-10-CM | POA: Diagnosis not present

## 2021-07-31 DIAGNOSIS — I739 Peripheral vascular disease, unspecified: Secondary | ICD-10-CM | POA: Diagnosis not present

## 2021-07-31 DIAGNOSIS — R69 Illness, unspecified: Secondary | ICD-10-CM | POA: Diagnosis not present

## 2021-07-31 DIAGNOSIS — I872 Venous insufficiency (chronic) (peripheral): Secondary | ICD-10-CM | POA: Diagnosis not present

## 2021-07-31 DIAGNOSIS — I10 Essential (primary) hypertension: Secondary | ICD-10-CM | POA: Diagnosis not present

## 2021-07-31 DIAGNOSIS — J449 Chronic obstructive pulmonary disease, unspecified: Secondary | ICD-10-CM | POA: Diagnosis not present

## 2021-07-31 DIAGNOSIS — L97222 Non-pressure chronic ulcer of left calf with fat layer exposed: Secondary | ICD-10-CM | POA: Diagnosis not present

## 2021-08-04 DIAGNOSIS — U071 COVID-19: Secondary | ICD-10-CM | POA: Diagnosis not present

## 2021-08-04 DIAGNOSIS — L97222 Non-pressure chronic ulcer of left calf with fat layer exposed: Secondary | ICD-10-CM | POA: Diagnosis not present

## 2021-08-04 DIAGNOSIS — J449 Chronic obstructive pulmonary disease, unspecified: Secondary | ICD-10-CM | POA: Diagnosis not present

## 2021-08-04 DIAGNOSIS — I739 Peripheral vascular disease, unspecified: Secondary | ICD-10-CM | POA: Diagnosis not present

## 2021-08-04 DIAGNOSIS — L97322 Non-pressure chronic ulcer of left ankle with fat layer exposed: Secondary | ICD-10-CM | POA: Diagnosis not present

## 2021-08-04 DIAGNOSIS — Z1211 Encounter for screening for malignant neoplasm of colon: Secondary | ICD-10-CM | POA: Diagnosis not present

## 2021-08-04 DIAGNOSIS — I890 Lymphedema, not elsewhere classified: Secondary | ICD-10-CM | POA: Diagnosis not present

## 2021-08-04 DIAGNOSIS — I10 Essential (primary) hypertension: Secondary | ICD-10-CM | POA: Diagnosis not present

## 2021-08-04 DIAGNOSIS — I872 Venous insufficiency (chronic) (peripheral): Secondary | ICD-10-CM | POA: Diagnosis not present

## 2021-08-04 DIAGNOSIS — Z1212 Encounter for screening for malignant neoplasm of rectum: Secondary | ICD-10-CM | POA: Diagnosis not present

## 2021-08-04 DIAGNOSIS — R69 Illness, unspecified: Secondary | ICD-10-CM | POA: Diagnosis not present

## 2021-08-07 DIAGNOSIS — L280 Lichen simplex chronicus: Secondary | ICD-10-CM | POA: Diagnosis not present

## 2021-08-07 DIAGNOSIS — D485 Neoplasm of uncertain behavior of skin: Secondary | ICD-10-CM | POA: Diagnosis not present

## 2021-08-07 DIAGNOSIS — I890 Lymphedema, not elsewhere classified: Secondary | ICD-10-CM | POA: Diagnosis not present

## 2021-08-07 DIAGNOSIS — I739 Peripheral vascular disease, unspecified: Secondary | ICD-10-CM | POA: Diagnosis not present

## 2021-08-07 DIAGNOSIS — L97222 Non-pressure chronic ulcer of left calf with fat layer exposed: Secondary | ICD-10-CM | POA: Diagnosis not present

## 2021-08-07 DIAGNOSIS — J449 Chronic obstructive pulmonary disease, unspecified: Secondary | ICD-10-CM | POA: Diagnosis not present

## 2021-08-07 DIAGNOSIS — L0100 Impetigo, unspecified: Secondary | ICD-10-CM | POA: Diagnosis not present

## 2021-08-07 DIAGNOSIS — I10 Essential (primary) hypertension: Secondary | ICD-10-CM | POA: Diagnosis not present

## 2021-08-07 DIAGNOSIS — L97322 Non-pressure chronic ulcer of left ankle with fat layer exposed: Secondary | ICD-10-CM | POA: Diagnosis not present

## 2021-08-07 DIAGNOSIS — R69 Illness, unspecified: Secondary | ICD-10-CM | POA: Diagnosis not present

## 2021-08-07 DIAGNOSIS — I872 Venous insufficiency (chronic) (peripheral): Secondary | ICD-10-CM | POA: Diagnosis not present

## 2021-08-07 DIAGNOSIS — U071 COVID-19: Secondary | ICD-10-CM | POA: Diagnosis not present

## 2021-08-07 DIAGNOSIS — L989 Disorder of the skin and subcutaneous tissue, unspecified: Secondary | ICD-10-CM | POA: Diagnosis not present

## 2021-08-12 DIAGNOSIS — L97222 Non-pressure chronic ulcer of left calf with fat layer exposed: Secondary | ICD-10-CM | POA: Diagnosis not present

## 2021-08-12 DIAGNOSIS — I10 Essential (primary) hypertension: Secondary | ICD-10-CM | POA: Diagnosis not present

## 2021-08-12 DIAGNOSIS — U071 COVID-19: Secondary | ICD-10-CM | POA: Diagnosis not present

## 2021-08-12 DIAGNOSIS — L97322 Non-pressure chronic ulcer of left ankle with fat layer exposed: Secondary | ICD-10-CM | POA: Diagnosis not present

## 2021-08-12 DIAGNOSIS — I890 Lymphedema, not elsewhere classified: Secondary | ICD-10-CM | POA: Diagnosis not present

## 2021-08-12 DIAGNOSIS — I739 Peripheral vascular disease, unspecified: Secondary | ICD-10-CM | POA: Diagnosis not present

## 2021-08-12 DIAGNOSIS — R69 Illness, unspecified: Secondary | ICD-10-CM | POA: Diagnosis not present

## 2021-08-12 DIAGNOSIS — J449 Chronic obstructive pulmonary disease, unspecified: Secondary | ICD-10-CM | POA: Diagnosis not present

## 2021-08-12 DIAGNOSIS — I872 Venous insufficiency (chronic) (peripheral): Secondary | ICD-10-CM | POA: Diagnosis not present

## 2021-08-13 DIAGNOSIS — L97222 Non-pressure chronic ulcer of left calf with fat layer exposed: Secondary | ICD-10-CM | POA: Diagnosis not present

## 2021-08-13 DIAGNOSIS — I70209 Unspecified atherosclerosis of native arteries of extremities, unspecified extremity: Secondary | ICD-10-CM | POA: Diagnosis not present

## 2021-08-13 DIAGNOSIS — R600 Localized edema: Secondary | ICD-10-CM | POA: Diagnosis not present

## 2021-08-13 DIAGNOSIS — S91001D Unspecified open wound, right ankle, subsequent encounter: Secondary | ICD-10-CM | POA: Diagnosis not present

## 2021-08-13 DIAGNOSIS — X58XXXA Exposure to other specified factors, initial encounter: Secondary | ICD-10-CM | POA: Diagnosis not present

## 2021-08-13 DIAGNOSIS — S80812A Abrasion, left lower leg, initial encounter: Secondary | ICD-10-CM | POA: Diagnosis not present

## 2021-08-13 DIAGNOSIS — I872 Venous insufficiency (chronic) (peripheral): Secondary | ICD-10-CM | POA: Diagnosis not present

## 2021-08-13 DIAGNOSIS — I890 Lymphedema, not elsewhere classified: Secondary | ICD-10-CM | POA: Diagnosis not present

## 2021-08-13 DIAGNOSIS — J449 Chronic obstructive pulmonary disease, unspecified: Secondary | ICD-10-CM | POA: Diagnosis not present

## 2021-08-13 DIAGNOSIS — I701 Atherosclerosis of renal artery: Secondary | ICD-10-CM | POA: Diagnosis not present

## 2021-08-13 DIAGNOSIS — I83023 Varicose veins of left lower extremity with ulcer of ankle: Secondary | ICD-10-CM | POA: Diagnosis not present

## 2021-08-13 DIAGNOSIS — I700 Atherosclerosis of aorta: Secondary | ICD-10-CM | POA: Diagnosis not present

## 2021-08-13 DIAGNOSIS — L97322 Non-pressure chronic ulcer of left ankle with fat layer exposed: Secondary | ICD-10-CM | POA: Diagnosis not present

## 2021-08-15 DIAGNOSIS — J449 Chronic obstructive pulmonary disease, unspecified: Secondary | ICD-10-CM | POA: Diagnosis not present

## 2021-08-15 DIAGNOSIS — R69 Illness, unspecified: Secondary | ICD-10-CM | POA: Diagnosis not present

## 2021-08-15 DIAGNOSIS — I10 Essential (primary) hypertension: Secondary | ICD-10-CM | POA: Diagnosis not present

## 2021-08-15 DIAGNOSIS — I739 Peripheral vascular disease, unspecified: Secondary | ICD-10-CM | POA: Diagnosis not present

## 2021-08-15 DIAGNOSIS — Z23 Encounter for immunization: Secondary | ICD-10-CM | POA: Diagnosis not present

## 2021-08-15 DIAGNOSIS — I872 Venous insufficiency (chronic) (peripheral): Secondary | ICD-10-CM | POA: Diagnosis not present

## 2021-08-15 DIAGNOSIS — I890 Lymphedema, not elsewhere classified: Secondary | ICD-10-CM | POA: Diagnosis not present

## 2021-08-15 DIAGNOSIS — L97322 Non-pressure chronic ulcer of left ankle with fat layer exposed: Secondary | ICD-10-CM | POA: Diagnosis not present

## 2021-08-15 DIAGNOSIS — L97222 Non-pressure chronic ulcer of left calf with fat layer exposed: Secondary | ICD-10-CM | POA: Diagnosis not present

## 2021-08-15 DIAGNOSIS — U071 COVID-19: Secondary | ICD-10-CM | POA: Diagnosis not present

## 2021-08-24 DIAGNOSIS — N189 Chronic kidney disease, unspecified: Secondary | ICD-10-CM | POA: Diagnosis not present

## 2021-08-24 DIAGNOSIS — S90812D Abrasion, left foot, subsequent encounter: Secondary | ICD-10-CM | POA: Diagnosis not present

## 2021-08-24 DIAGNOSIS — D500 Iron deficiency anemia secondary to blood loss (chronic): Secondary | ICD-10-CM | POA: Diagnosis not present

## 2021-08-24 DIAGNOSIS — L97322 Non-pressure chronic ulcer of left ankle with fat layer exposed: Secondary | ICD-10-CM | POA: Diagnosis not present

## 2021-08-24 DIAGNOSIS — I739 Peripheral vascular disease, unspecified: Secondary | ICD-10-CM | POA: Diagnosis not present

## 2021-08-24 DIAGNOSIS — I872 Venous insufficiency (chronic) (peripheral): Secondary | ICD-10-CM | POA: Diagnosis not present

## 2021-08-24 DIAGNOSIS — J449 Chronic obstructive pulmonary disease, unspecified: Secondary | ICD-10-CM | POA: Diagnosis not present

## 2021-08-24 DIAGNOSIS — L97312 Non-pressure chronic ulcer of right ankle with fat layer exposed: Secondary | ICD-10-CM | POA: Diagnosis not present

## 2021-08-24 DIAGNOSIS — I129 Hypertensive chronic kidney disease with stage 1 through stage 4 chronic kidney disease, or unspecified chronic kidney disease: Secondary | ICD-10-CM | POA: Diagnosis not present

## 2021-08-25 DIAGNOSIS — N189 Chronic kidney disease, unspecified: Secondary | ICD-10-CM | POA: Diagnosis not present

## 2021-08-25 DIAGNOSIS — L97329 Non-pressure chronic ulcer of left ankle with unspecified severity: Secondary | ICD-10-CM | POA: Diagnosis not present

## 2021-08-25 DIAGNOSIS — D500 Iron deficiency anemia secondary to blood loss (chronic): Secondary | ICD-10-CM | POA: Diagnosis not present

## 2021-08-25 DIAGNOSIS — R600 Localized edema: Secondary | ICD-10-CM | POA: Diagnosis not present

## 2021-08-25 DIAGNOSIS — I890 Lymphedema, not elsewhere classified: Secondary | ICD-10-CM | POA: Diagnosis not present

## 2021-08-25 DIAGNOSIS — L97322 Non-pressure chronic ulcer of left ankle with fat layer exposed: Secondary | ICD-10-CM | POA: Diagnosis not present

## 2021-08-25 DIAGNOSIS — I129 Hypertensive chronic kidney disease with stage 1 through stage 4 chronic kidney disease, or unspecified chronic kidney disease: Secondary | ICD-10-CM | POA: Diagnosis not present

## 2021-08-25 DIAGNOSIS — L97312 Non-pressure chronic ulcer of right ankle with fat layer exposed: Secondary | ICD-10-CM | POA: Diagnosis not present

## 2021-08-25 DIAGNOSIS — I739 Peripheral vascular disease, unspecified: Secondary | ICD-10-CM | POA: Diagnosis not present

## 2021-08-25 DIAGNOSIS — S90812D Abrasion, left foot, subsequent encounter: Secondary | ICD-10-CM | POA: Diagnosis not present

## 2021-08-25 DIAGNOSIS — I10 Essential (primary) hypertension: Secondary | ICD-10-CM | POA: Diagnosis not present

## 2021-08-25 DIAGNOSIS — Z87891 Personal history of nicotine dependence: Secondary | ICD-10-CM | POA: Diagnosis not present

## 2021-08-25 DIAGNOSIS — Z79899 Other long term (current) drug therapy: Secondary | ICD-10-CM | POA: Diagnosis not present

## 2021-08-25 DIAGNOSIS — J449 Chronic obstructive pulmonary disease, unspecified: Secondary | ICD-10-CM | POA: Diagnosis not present

## 2021-08-25 DIAGNOSIS — L97922 Non-pressure chronic ulcer of unspecified part of left lower leg with fat layer exposed: Secondary | ICD-10-CM | POA: Diagnosis not present

## 2021-08-25 DIAGNOSIS — I872 Venous insufficiency (chronic) (peripheral): Secondary | ICD-10-CM | POA: Diagnosis not present

## 2021-08-26 DIAGNOSIS — S90812D Abrasion, left foot, subsequent encounter: Secondary | ICD-10-CM | POA: Diagnosis not present

## 2021-08-26 DIAGNOSIS — I872 Venous insufficiency (chronic) (peripheral): Secondary | ICD-10-CM | POA: Diagnosis not present

## 2021-08-26 DIAGNOSIS — L280 Lichen simplex chronicus: Secondary | ICD-10-CM | POA: Diagnosis not present

## 2021-08-26 DIAGNOSIS — I129 Hypertensive chronic kidney disease with stage 1 through stage 4 chronic kidney disease, or unspecified chronic kidney disease: Secondary | ICD-10-CM | POA: Diagnosis not present

## 2021-08-26 DIAGNOSIS — L97322 Non-pressure chronic ulcer of left ankle with fat layer exposed: Secondary | ICD-10-CM | POA: Diagnosis not present

## 2021-08-26 DIAGNOSIS — L97312 Non-pressure chronic ulcer of right ankle with fat layer exposed: Secondary | ICD-10-CM | POA: Diagnosis not present

## 2021-08-26 DIAGNOSIS — J449 Chronic obstructive pulmonary disease, unspecified: Secondary | ICD-10-CM | POA: Diagnosis not present

## 2021-08-26 DIAGNOSIS — I739 Peripheral vascular disease, unspecified: Secondary | ICD-10-CM | POA: Diagnosis not present

## 2021-08-26 DIAGNOSIS — D500 Iron deficiency anemia secondary to blood loss (chronic): Secondary | ICD-10-CM | POA: Diagnosis not present

## 2021-08-26 DIAGNOSIS — N189 Chronic kidney disease, unspecified: Secondary | ICD-10-CM | POA: Diagnosis not present

## 2021-08-27 DIAGNOSIS — L97222 Non-pressure chronic ulcer of left calf with fat layer exposed: Secondary | ICD-10-CM | POA: Diagnosis not present

## 2021-08-27 DIAGNOSIS — L97329 Non-pressure chronic ulcer of left ankle with unspecified severity: Secondary | ICD-10-CM | POA: Diagnosis not present

## 2021-08-27 DIAGNOSIS — I10 Essential (primary) hypertension: Secondary | ICD-10-CM | POA: Diagnosis not present

## 2021-08-27 DIAGNOSIS — I890 Lymphedema, not elsewhere classified: Secondary | ICD-10-CM | POA: Diagnosis not present

## 2021-08-27 DIAGNOSIS — I872 Venous insufficiency (chronic) (peripheral): Secondary | ICD-10-CM | POA: Diagnosis not present

## 2021-08-27 DIAGNOSIS — S80812A Abrasion, left lower leg, initial encounter: Secondary | ICD-10-CM | POA: Diagnosis not present

## 2021-08-27 DIAGNOSIS — J449 Chronic obstructive pulmonary disease, unspecified: Secondary | ICD-10-CM | POA: Diagnosis not present

## 2021-08-27 DIAGNOSIS — Z79899 Other long term (current) drug therapy: Secondary | ICD-10-CM | POA: Diagnosis not present

## 2021-08-27 DIAGNOSIS — S91101D Unspecified open wound of right great toe without damage to nail, subsequent encounter: Secondary | ICD-10-CM | POA: Diagnosis not present

## 2021-08-27 DIAGNOSIS — L97922 Non-pressure chronic ulcer of unspecified part of left lower leg with fat layer exposed: Secondary | ICD-10-CM | POA: Diagnosis not present

## 2021-08-27 DIAGNOSIS — Z87891 Personal history of nicotine dependence: Secondary | ICD-10-CM | POA: Diagnosis not present

## 2021-08-27 DIAGNOSIS — R600 Localized edema: Secondary | ICD-10-CM | POA: Diagnosis not present

## 2021-08-28 DIAGNOSIS — R600 Localized edema: Secondary | ICD-10-CM | POA: Diagnosis not present

## 2021-08-28 DIAGNOSIS — I890 Lymphedema, not elsewhere classified: Secondary | ICD-10-CM | POA: Diagnosis not present

## 2021-08-28 DIAGNOSIS — I10 Essential (primary) hypertension: Secondary | ICD-10-CM | POA: Diagnosis not present

## 2021-08-28 DIAGNOSIS — Z79899 Other long term (current) drug therapy: Secondary | ICD-10-CM | POA: Diagnosis not present

## 2021-08-28 DIAGNOSIS — I872 Venous insufficiency (chronic) (peripheral): Secondary | ICD-10-CM | POA: Diagnosis not present

## 2021-08-28 DIAGNOSIS — L97329 Non-pressure chronic ulcer of left ankle with unspecified severity: Secondary | ICD-10-CM | POA: Diagnosis not present

## 2021-08-28 DIAGNOSIS — Z87891 Personal history of nicotine dependence: Secondary | ICD-10-CM | POA: Diagnosis not present

## 2021-08-28 DIAGNOSIS — L97922 Non-pressure chronic ulcer of unspecified part of left lower leg with fat layer exposed: Secondary | ICD-10-CM | POA: Diagnosis not present

## 2021-08-28 DIAGNOSIS — J449 Chronic obstructive pulmonary disease, unspecified: Secondary | ICD-10-CM | POA: Diagnosis not present

## 2021-08-29 DIAGNOSIS — I739 Peripheral vascular disease, unspecified: Secondary | ICD-10-CM | POA: Diagnosis not present

## 2021-08-29 DIAGNOSIS — I890 Lymphedema, not elsewhere classified: Secondary | ICD-10-CM | POA: Diagnosis not present

## 2021-08-29 DIAGNOSIS — I129 Hypertensive chronic kidney disease with stage 1 through stage 4 chronic kidney disease, or unspecified chronic kidney disease: Secondary | ICD-10-CM | POA: Diagnosis not present

## 2021-08-29 DIAGNOSIS — L97329 Non-pressure chronic ulcer of left ankle with unspecified severity: Secondary | ICD-10-CM | POA: Diagnosis not present

## 2021-08-29 DIAGNOSIS — L97322 Non-pressure chronic ulcer of left ankle with fat layer exposed: Secondary | ICD-10-CM | POA: Diagnosis not present

## 2021-08-29 DIAGNOSIS — N189 Chronic kidney disease, unspecified: Secondary | ICD-10-CM | POA: Diagnosis not present

## 2021-08-29 DIAGNOSIS — I872 Venous insufficiency (chronic) (peripheral): Secondary | ICD-10-CM | POA: Diagnosis not present

## 2021-08-29 DIAGNOSIS — D500 Iron deficiency anemia secondary to blood loss (chronic): Secondary | ICD-10-CM | POA: Diagnosis not present

## 2021-08-29 DIAGNOSIS — R600 Localized edema: Secondary | ICD-10-CM | POA: Diagnosis not present

## 2021-08-29 DIAGNOSIS — L97922 Non-pressure chronic ulcer of unspecified part of left lower leg with fat layer exposed: Secondary | ICD-10-CM | POA: Diagnosis not present

## 2021-08-29 DIAGNOSIS — J449 Chronic obstructive pulmonary disease, unspecified: Secondary | ICD-10-CM | POA: Diagnosis not present

## 2021-08-29 DIAGNOSIS — S90812D Abrasion, left foot, subsequent encounter: Secondary | ICD-10-CM | POA: Diagnosis not present

## 2021-08-29 DIAGNOSIS — Z87891 Personal history of nicotine dependence: Secondary | ICD-10-CM | POA: Diagnosis not present

## 2021-08-29 DIAGNOSIS — L97312 Non-pressure chronic ulcer of right ankle with fat layer exposed: Secondary | ICD-10-CM | POA: Diagnosis not present

## 2021-08-29 DIAGNOSIS — Z79899 Other long term (current) drug therapy: Secondary | ICD-10-CM | POA: Diagnosis not present

## 2021-08-29 DIAGNOSIS — I10 Essential (primary) hypertension: Secondary | ICD-10-CM | POA: Diagnosis not present

## 2021-09-01 DIAGNOSIS — L97322 Non-pressure chronic ulcer of left ankle with fat layer exposed: Secondary | ICD-10-CM | POA: Diagnosis not present

## 2021-09-01 DIAGNOSIS — I872 Venous insufficiency (chronic) (peripheral): Secondary | ICD-10-CM | POA: Diagnosis not present

## 2021-09-01 DIAGNOSIS — I890 Lymphedema, not elsewhere classified: Secondary | ICD-10-CM | POA: Diagnosis not present

## 2021-09-01 DIAGNOSIS — N189 Chronic kidney disease, unspecified: Secondary | ICD-10-CM | POA: Diagnosis not present

## 2021-09-01 DIAGNOSIS — L97312 Non-pressure chronic ulcer of right ankle with fat layer exposed: Secondary | ICD-10-CM | POA: Diagnosis not present

## 2021-09-01 DIAGNOSIS — D500 Iron deficiency anemia secondary to blood loss (chronic): Secondary | ICD-10-CM | POA: Diagnosis not present

## 2021-09-01 DIAGNOSIS — I129 Hypertensive chronic kidney disease with stage 1 through stage 4 chronic kidney disease, or unspecified chronic kidney disease: Secondary | ICD-10-CM | POA: Diagnosis not present

## 2021-09-01 DIAGNOSIS — J449 Chronic obstructive pulmonary disease, unspecified: Secondary | ICD-10-CM | POA: Diagnosis not present

## 2021-09-01 DIAGNOSIS — R600 Localized edema: Secondary | ICD-10-CM | POA: Diagnosis not present

## 2021-09-01 DIAGNOSIS — L97329 Non-pressure chronic ulcer of left ankle with unspecified severity: Secondary | ICD-10-CM | POA: Diagnosis not present

## 2021-09-01 DIAGNOSIS — I10 Essential (primary) hypertension: Secondary | ICD-10-CM | POA: Diagnosis not present

## 2021-09-01 DIAGNOSIS — Z79899 Other long term (current) drug therapy: Secondary | ICD-10-CM | POA: Diagnosis not present

## 2021-09-01 DIAGNOSIS — L97922 Non-pressure chronic ulcer of unspecified part of left lower leg with fat layer exposed: Secondary | ICD-10-CM | POA: Diagnosis not present

## 2021-09-01 DIAGNOSIS — S90812D Abrasion, left foot, subsequent encounter: Secondary | ICD-10-CM | POA: Diagnosis not present

## 2021-09-01 DIAGNOSIS — Z87891 Personal history of nicotine dependence: Secondary | ICD-10-CM | POA: Diagnosis not present

## 2021-09-01 DIAGNOSIS — I739 Peripheral vascular disease, unspecified: Secondary | ICD-10-CM | POA: Diagnosis not present

## 2021-09-02 DIAGNOSIS — Z87891 Personal history of nicotine dependence: Secondary | ICD-10-CM | POA: Diagnosis not present

## 2021-09-02 DIAGNOSIS — R600 Localized edema: Secondary | ICD-10-CM | POA: Diagnosis not present

## 2021-09-02 DIAGNOSIS — L97329 Non-pressure chronic ulcer of left ankle with unspecified severity: Secondary | ICD-10-CM | POA: Diagnosis not present

## 2021-09-02 DIAGNOSIS — J449 Chronic obstructive pulmonary disease, unspecified: Secondary | ICD-10-CM | POA: Diagnosis not present

## 2021-09-02 DIAGNOSIS — I872 Venous insufficiency (chronic) (peripheral): Secondary | ICD-10-CM | POA: Diagnosis not present

## 2021-09-02 DIAGNOSIS — I10 Essential (primary) hypertension: Secondary | ICD-10-CM | POA: Diagnosis not present

## 2021-09-02 DIAGNOSIS — L97922 Non-pressure chronic ulcer of unspecified part of left lower leg with fat layer exposed: Secondary | ICD-10-CM | POA: Diagnosis not present

## 2021-09-02 DIAGNOSIS — Z79899 Other long term (current) drug therapy: Secondary | ICD-10-CM | POA: Diagnosis not present

## 2021-09-02 DIAGNOSIS — I890 Lymphedema, not elsewhere classified: Secondary | ICD-10-CM | POA: Diagnosis not present

## 2021-09-03 DIAGNOSIS — J449 Chronic obstructive pulmonary disease, unspecified: Secondary | ICD-10-CM | POA: Diagnosis not present

## 2021-09-03 DIAGNOSIS — L97329 Non-pressure chronic ulcer of left ankle with unspecified severity: Secondary | ICD-10-CM | POA: Diagnosis not present

## 2021-09-03 DIAGNOSIS — R600 Localized edema: Secondary | ICD-10-CM | POA: Diagnosis not present

## 2021-09-03 DIAGNOSIS — I10 Essential (primary) hypertension: Secondary | ICD-10-CM | POA: Diagnosis not present

## 2021-09-03 DIAGNOSIS — Z87891 Personal history of nicotine dependence: Secondary | ICD-10-CM | POA: Diagnosis not present

## 2021-09-03 DIAGNOSIS — L97922 Non-pressure chronic ulcer of unspecified part of left lower leg with fat layer exposed: Secondary | ICD-10-CM | POA: Diagnosis not present

## 2021-09-03 DIAGNOSIS — L97222 Non-pressure chronic ulcer of left calf with fat layer exposed: Secondary | ICD-10-CM | POA: Diagnosis not present

## 2021-09-03 DIAGNOSIS — Z79899 Other long term (current) drug therapy: Secondary | ICD-10-CM | POA: Diagnosis not present

## 2021-09-03 DIAGNOSIS — I890 Lymphedema, not elsewhere classified: Secondary | ICD-10-CM | POA: Diagnosis not present

## 2021-09-03 DIAGNOSIS — I872 Venous insufficiency (chronic) (peripheral): Secondary | ICD-10-CM | POA: Diagnosis not present

## 2021-09-03 DIAGNOSIS — S91301A Unspecified open wound, right foot, initial encounter: Secondary | ICD-10-CM | POA: Diagnosis not present

## 2021-09-04 DIAGNOSIS — I739 Peripheral vascular disease, unspecified: Secondary | ICD-10-CM | POA: Diagnosis not present

## 2021-09-04 DIAGNOSIS — N189 Chronic kidney disease, unspecified: Secondary | ICD-10-CM | POA: Diagnosis not present

## 2021-09-04 DIAGNOSIS — L97322 Non-pressure chronic ulcer of left ankle with fat layer exposed: Secondary | ICD-10-CM | POA: Diagnosis not present

## 2021-09-04 DIAGNOSIS — S90812D Abrasion, left foot, subsequent encounter: Secondary | ICD-10-CM | POA: Diagnosis not present

## 2021-09-04 DIAGNOSIS — J449 Chronic obstructive pulmonary disease, unspecified: Secondary | ICD-10-CM | POA: Diagnosis not present

## 2021-09-04 DIAGNOSIS — D500 Iron deficiency anemia secondary to blood loss (chronic): Secondary | ICD-10-CM | POA: Diagnosis not present

## 2021-09-04 DIAGNOSIS — I129 Hypertensive chronic kidney disease with stage 1 through stage 4 chronic kidney disease, or unspecified chronic kidney disease: Secondary | ICD-10-CM | POA: Diagnosis not present

## 2021-09-04 DIAGNOSIS — I872 Venous insufficiency (chronic) (peripheral): Secondary | ICD-10-CM | POA: Diagnosis not present

## 2021-09-04 DIAGNOSIS — L97312 Non-pressure chronic ulcer of right ankle with fat layer exposed: Secondary | ICD-10-CM | POA: Diagnosis not present

## 2021-09-05 DIAGNOSIS — D500 Iron deficiency anemia secondary to blood loss (chronic): Secondary | ICD-10-CM | POA: Diagnosis not present

## 2021-09-05 DIAGNOSIS — N189 Chronic kidney disease, unspecified: Secondary | ICD-10-CM | POA: Diagnosis not present

## 2021-09-05 DIAGNOSIS — I129 Hypertensive chronic kidney disease with stage 1 through stage 4 chronic kidney disease, or unspecified chronic kidney disease: Secondary | ICD-10-CM | POA: Diagnosis not present

## 2021-09-05 DIAGNOSIS — L97312 Non-pressure chronic ulcer of right ankle with fat layer exposed: Secondary | ICD-10-CM | POA: Diagnosis not present

## 2021-09-05 DIAGNOSIS — I872 Venous insufficiency (chronic) (peripheral): Secondary | ICD-10-CM | POA: Diagnosis not present

## 2021-09-05 DIAGNOSIS — I739 Peripheral vascular disease, unspecified: Secondary | ICD-10-CM | POA: Diagnosis not present

## 2021-09-05 DIAGNOSIS — S90812D Abrasion, left foot, subsequent encounter: Secondary | ICD-10-CM | POA: Diagnosis not present

## 2021-09-05 DIAGNOSIS — J449 Chronic obstructive pulmonary disease, unspecified: Secondary | ICD-10-CM | POA: Diagnosis not present

## 2021-09-05 DIAGNOSIS — L97322 Non-pressure chronic ulcer of left ankle with fat layer exposed: Secondary | ICD-10-CM | POA: Diagnosis not present

## 2021-09-06 DIAGNOSIS — I872 Venous insufficiency (chronic) (peripheral): Secondary | ICD-10-CM | POA: Diagnosis not present

## 2021-09-06 DIAGNOSIS — J449 Chronic obstructive pulmonary disease, unspecified: Secondary | ICD-10-CM | POA: Diagnosis not present

## 2021-09-06 DIAGNOSIS — L97322 Non-pressure chronic ulcer of left ankle with fat layer exposed: Secondary | ICD-10-CM | POA: Diagnosis not present

## 2021-09-06 DIAGNOSIS — S90812D Abrasion, left foot, subsequent encounter: Secondary | ICD-10-CM | POA: Diagnosis not present

## 2021-09-06 DIAGNOSIS — I739 Peripheral vascular disease, unspecified: Secondary | ICD-10-CM | POA: Diagnosis not present

## 2021-09-06 DIAGNOSIS — I129 Hypertensive chronic kidney disease with stage 1 through stage 4 chronic kidney disease, or unspecified chronic kidney disease: Secondary | ICD-10-CM | POA: Diagnosis not present

## 2021-09-06 DIAGNOSIS — D500 Iron deficiency anemia secondary to blood loss (chronic): Secondary | ICD-10-CM | POA: Diagnosis not present

## 2021-09-06 DIAGNOSIS — L97312 Non-pressure chronic ulcer of right ankle with fat layer exposed: Secondary | ICD-10-CM | POA: Diagnosis not present

## 2021-09-06 DIAGNOSIS — N189 Chronic kidney disease, unspecified: Secondary | ICD-10-CM | POA: Diagnosis not present

## 2021-09-08 DIAGNOSIS — I872 Venous insufficiency (chronic) (peripheral): Secondary | ICD-10-CM | POA: Diagnosis not present

## 2021-09-08 DIAGNOSIS — I739 Peripheral vascular disease, unspecified: Secondary | ICD-10-CM | POA: Diagnosis not present

## 2021-09-08 DIAGNOSIS — S90812D Abrasion, left foot, subsequent encounter: Secondary | ICD-10-CM | POA: Diagnosis not present

## 2021-09-08 DIAGNOSIS — J449 Chronic obstructive pulmonary disease, unspecified: Secondary | ICD-10-CM | POA: Diagnosis not present

## 2021-09-08 DIAGNOSIS — D500 Iron deficiency anemia secondary to blood loss (chronic): Secondary | ICD-10-CM | POA: Diagnosis not present

## 2021-09-08 DIAGNOSIS — I129 Hypertensive chronic kidney disease with stage 1 through stage 4 chronic kidney disease, or unspecified chronic kidney disease: Secondary | ICD-10-CM | POA: Diagnosis not present

## 2021-09-08 DIAGNOSIS — L97312 Non-pressure chronic ulcer of right ankle with fat layer exposed: Secondary | ICD-10-CM | POA: Diagnosis not present

## 2021-09-08 DIAGNOSIS — N189 Chronic kidney disease, unspecified: Secondary | ICD-10-CM | POA: Diagnosis not present

## 2021-09-08 DIAGNOSIS — L97322 Non-pressure chronic ulcer of left ankle with fat layer exposed: Secondary | ICD-10-CM | POA: Diagnosis not present

## 2021-09-10 DIAGNOSIS — T148XXA Other injury of unspecified body region, initial encounter: Secondary | ICD-10-CM | POA: Diagnosis not present

## 2021-09-10 DIAGNOSIS — I70233 Atherosclerosis of native arteries of right leg with ulceration of ankle: Secondary | ICD-10-CM | POA: Diagnosis not present

## 2021-09-10 DIAGNOSIS — L97312 Non-pressure chronic ulcer of right ankle with fat layer exposed: Secondary | ICD-10-CM | POA: Diagnosis not present

## 2021-09-10 DIAGNOSIS — I129 Hypertensive chronic kidney disease with stage 1 through stage 4 chronic kidney disease, or unspecified chronic kidney disease: Secondary | ICD-10-CM | POA: Diagnosis not present

## 2021-09-10 DIAGNOSIS — I890 Lymphedema, not elsewhere classified: Secondary | ICD-10-CM | POA: Diagnosis not present

## 2021-09-10 DIAGNOSIS — L97922 Non-pressure chronic ulcer of unspecified part of left lower leg with fat layer exposed: Secondary | ICD-10-CM | POA: Diagnosis not present

## 2021-09-10 DIAGNOSIS — L97222 Non-pressure chronic ulcer of left calf with fat layer exposed: Secondary | ICD-10-CM | POA: Diagnosis not present

## 2021-09-10 DIAGNOSIS — L97329 Non-pressure chronic ulcer of left ankle with unspecified severity: Secondary | ICD-10-CM | POA: Diagnosis not present

## 2021-09-10 DIAGNOSIS — I83013 Varicose veins of right lower extremity with ulcer of ankle: Secondary | ICD-10-CM | POA: Diagnosis not present

## 2021-09-10 DIAGNOSIS — Z79899 Other long term (current) drug therapy: Secondary | ICD-10-CM | POA: Diagnosis not present

## 2021-09-10 DIAGNOSIS — I83019 Varicose veins of right lower extremity with ulcer of unspecified site: Secondary | ICD-10-CM | POA: Diagnosis not present

## 2021-09-10 DIAGNOSIS — I739 Peripheral vascular disease, unspecified: Secondary | ICD-10-CM | POA: Diagnosis not present

## 2021-09-10 DIAGNOSIS — J449 Chronic obstructive pulmonary disease, unspecified: Secondary | ICD-10-CM | POA: Diagnosis not present

## 2021-09-10 DIAGNOSIS — N189 Chronic kidney disease, unspecified: Secondary | ICD-10-CM | POA: Diagnosis not present

## 2021-09-10 DIAGNOSIS — L97322 Non-pressure chronic ulcer of left ankle with fat layer exposed: Secondary | ICD-10-CM | POA: Diagnosis not present

## 2021-09-10 DIAGNOSIS — Z87891 Personal history of nicotine dependence: Secondary | ICD-10-CM | POA: Diagnosis not present

## 2021-09-10 DIAGNOSIS — I10 Essential (primary) hypertension: Secondary | ICD-10-CM | POA: Diagnosis not present

## 2021-09-10 DIAGNOSIS — I872 Venous insufficiency (chronic) (peripheral): Secondary | ICD-10-CM | POA: Diagnosis not present

## 2021-09-10 DIAGNOSIS — I83023 Varicose veins of left lower extremity with ulcer of ankle: Secondary | ICD-10-CM | POA: Diagnosis not present

## 2021-09-10 DIAGNOSIS — R600 Localized edema: Secondary | ICD-10-CM | POA: Diagnosis not present

## 2021-09-10 DIAGNOSIS — D500 Iron deficiency anemia secondary to blood loss (chronic): Secondary | ICD-10-CM | POA: Diagnosis not present

## 2021-09-10 DIAGNOSIS — S90812D Abrasion, left foot, subsequent encounter: Secondary | ICD-10-CM | POA: Diagnosis not present

## 2021-09-11 DIAGNOSIS — L97312 Non-pressure chronic ulcer of right ankle with fat layer exposed: Secondary | ICD-10-CM | POA: Diagnosis not present

## 2021-09-11 DIAGNOSIS — I739 Peripheral vascular disease, unspecified: Secondary | ICD-10-CM | POA: Diagnosis not present

## 2021-09-11 DIAGNOSIS — I872 Venous insufficiency (chronic) (peripheral): Secondary | ICD-10-CM | POA: Diagnosis not present

## 2021-09-11 DIAGNOSIS — J449 Chronic obstructive pulmonary disease, unspecified: Secondary | ICD-10-CM | POA: Diagnosis not present

## 2021-09-11 DIAGNOSIS — S90812D Abrasion, left foot, subsequent encounter: Secondary | ICD-10-CM | POA: Diagnosis not present

## 2021-09-11 DIAGNOSIS — D500 Iron deficiency anemia secondary to blood loss (chronic): Secondary | ICD-10-CM | POA: Diagnosis not present

## 2021-09-11 DIAGNOSIS — I129 Hypertensive chronic kidney disease with stage 1 through stage 4 chronic kidney disease, or unspecified chronic kidney disease: Secondary | ICD-10-CM | POA: Diagnosis not present

## 2021-09-11 DIAGNOSIS — N189 Chronic kidney disease, unspecified: Secondary | ICD-10-CM | POA: Diagnosis not present

## 2021-09-11 DIAGNOSIS — L97322 Non-pressure chronic ulcer of left ankle with fat layer exposed: Secondary | ICD-10-CM | POA: Diagnosis not present

## 2021-09-13 DIAGNOSIS — I739 Peripheral vascular disease, unspecified: Secondary | ICD-10-CM | POA: Diagnosis not present

## 2021-09-13 DIAGNOSIS — L97322 Non-pressure chronic ulcer of left ankle with fat layer exposed: Secondary | ICD-10-CM | POA: Diagnosis not present

## 2021-09-13 DIAGNOSIS — S90812D Abrasion, left foot, subsequent encounter: Secondary | ICD-10-CM | POA: Diagnosis not present

## 2021-09-13 DIAGNOSIS — L97312 Non-pressure chronic ulcer of right ankle with fat layer exposed: Secondary | ICD-10-CM | POA: Diagnosis not present

## 2021-09-13 DIAGNOSIS — N189 Chronic kidney disease, unspecified: Secondary | ICD-10-CM | POA: Diagnosis not present

## 2021-09-13 DIAGNOSIS — D500 Iron deficiency anemia secondary to blood loss (chronic): Secondary | ICD-10-CM | POA: Diagnosis not present

## 2021-09-13 DIAGNOSIS — I872 Venous insufficiency (chronic) (peripheral): Secondary | ICD-10-CM | POA: Diagnosis not present

## 2021-09-13 DIAGNOSIS — I129 Hypertensive chronic kidney disease with stage 1 through stage 4 chronic kidney disease, or unspecified chronic kidney disease: Secondary | ICD-10-CM | POA: Diagnosis not present

## 2021-09-13 DIAGNOSIS — J449 Chronic obstructive pulmonary disease, unspecified: Secondary | ICD-10-CM | POA: Diagnosis not present

## 2021-09-16 DIAGNOSIS — I872 Venous insufficiency (chronic) (peripheral): Secondary | ICD-10-CM | POA: Diagnosis not present

## 2021-09-16 DIAGNOSIS — D500 Iron deficiency anemia secondary to blood loss (chronic): Secondary | ICD-10-CM | POA: Diagnosis not present

## 2021-09-16 DIAGNOSIS — I129 Hypertensive chronic kidney disease with stage 1 through stage 4 chronic kidney disease, or unspecified chronic kidney disease: Secondary | ICD-10-CM | POA: Diagnosis not present

## 2021-09-16 DIAGNOSIS — J449 Chronic obstructive pulmonary disease, unspecified: Secondary | ICD-10-CM | POA: Diagnosis not present

## 2021-09-16 DIAGNOSIS — I739 Peripheral vascular disease, unspecified: Secondary | ICD-10-CM | POA: Diagnosis not present

## 2021-09-16 DIAGNOSIS — L97312 Non-pressure chronic ulcer of right ankle with fat layer exposed: Secondary | ICD-10-CM | POA: Diagnosis not present

## 2021-09-16 DIAGNOSIS — S90812D Abrasion, left foot, subsequent encounter: Secondary | ICD-10-CM | POA: Diagnosis not present

## 2021-09-16 DIAGNOSIS — N189 Chronic kidney disease, unspecified: Secondary | ICD-10-CM | POA: Diagnosis not present

## 2021-09-16 DIAGNOSIS — L97322 Non-pressure chronic ulcer of left ankle with fat layer exposed: Secondary | ICD-10-CM | POA: Diagnosis not present

## 2021-09-19 DIAGNOSIS — I739 Peripheral vascular disease, unspecified: Secondary | ICD-10-CM | POA: Diagnosis not present

## 2021-09-19 DIAGNOSIS — J449 Chronic obstructive pulmonary disease, unspecified: Secondary | ICD-10-CM | POA: Diagnosis not present

## 2021-09-19 DIAGNOSIS — L97322 Non-pressure chronic ulcer of left ankle with fat layer exposed: Secondary | ICD-10-CM | POA: Diagnosis not present

## 2021-09-19 DIAGNOSIS — N189 Chronic kidney disease, unspecified: Secondary | ICD-10-CM | POA: Diagnosis not present

## 2021-09-19 DIAGNOSIS — L97312 Non-pressure chronic ulcer of right ankle with fat layer exposed: Secondary | ICD-10-CM | POA: Diagnosis not present

## 2021-09-19 DIAGNOSIS — D500 Iron deficiency anemia secondary to blood loss (chronic): Secondary | ICD-10-CM | POA: Diagnosis not present

## 2021-09-19 DIAGNOSIS — S90812D Abrasion, left foot, subsequent encounter: Secondary | ICD-10-CM | POA: Diagnosis not present

## 2021-09-19 DIAGNOSIS — I872 Venous insufficiency (chronic) (peripheral): Secondary | ICD-10-CM | POA: Diagnosis not present

## 2021-09-19 DIAGNOSIS — I129 Hypertensive chronic kidney disease with stage 1 through stage 4 chronic kidney disease, or unspecified chronic kidney disease: Secondary | ICD-10-CM | POA: Diagnosis not present

## 2021-09-23 DIAGNOSIS — N189 Chronic kidney disease, unspecified: Secondary | ICD-10-CM | POA: Diagnosis not present

## 2021-09-23 DIAGNOSIS — L97312 Non-pressure chronic ulcer of right ankle with fat layer exposed: Secondary | ICD-10-CM | POA: Diagnosis not present

## 2021-09-23 DIAGNOSIS — J449 Chronic obstructive pulmonary disease, unspecified: Secondary | ICD-10-CM | POA: Diagnosis not present

## 2021-09-23 DIAGNOSIS — S90812D Abrasion, left foot, subsequent encounter: Secondary | ICD-10-CM | POA: Diagnosis not present

## 2021-09-23 DIAGNOSIS — D500 Iron deficiency anemia secondary to blood loss (chronic): Secondary | ICD-10-CM | POA: Diagnosis not present

## 2021-09-23 DIAGNOSIS — L97322 Non-pressure chronic ulcer of left ankle with fat layer exposed: Secondary | ICD-10-CM | POA: Diagnosis not present

## 2021-09-23 DIAGNOSIS — I739 Peripheral vascular disease, unspecified: Secondary | ICD-10-CM | POA: Diagnosis not present

## 2021-09-23 DIAGNOSIS — I129 Hypertensive chronic kidney disease with stage 1 through stage 4 chronic kidney disease, or unspecified chronic kidney disease: Secondary | ICD-10-CM | POA: Diagnosis not present

## 2021-09-23 DIAGNOSIS — I872 Venous insufficiency (chronic) (peripheral): Secondary | ICD-10-CM | POA: Diagnosis not present

## 2021-09-26 DIAGNOSIS — J449 Chronic obstructive pulmonary disease, unspecified: Secondary | ICD-10-CM | POA: Diagnosis not present

## 2021-09-26 DIAGNOSIS — D500 Iron deficiency anemia secondary to blood loss (chronic): Secondary | ICD-10-CM | POA: Diagnosis not present

## 2021-09-26 DIAGNOSIS — I739 Peripheral vascular disease, unspecified: Secondary | ICD-10-CM | POA: Diagnosis not present

## 2021-09-26 DIAGNOSIS — L97312 Non-pressure chronic ulcer of right ankle with fat layer exposed: Secondary | ICD-10-CM | POA: Diagnosis not present

## 2021-09-26 DIAGNOSIS — I129 Hypertensive chronic kidney disease with stage 1 through stage 4 chronic kidney disease, or unspecified chronic kidney disease: Secondary | ICD-10-CM | POA: Diagnosis not present

## 2021-09-26 DIAGNOSIS — L97322 Non-pressure chronic ulcer of left ankle with fat layer exposed: Secondary | ICD-10-CM | POA: Diagnosis not present

## 2021-09-26 DIAGNOSIS — N189 Chronic kidney disease, unspecified: Secondary | ICD-10-CM | POA: Diagnosis not present

## 2021-09-26 DIAGNOSIS — S90812D Abrasion, left foot, subsequent encounter: Secondary | ICD-10-CM | POA: Diagnosis not present

## 2021-09-26 DIAGNOSIS — I872 Venous insufficiency (chronic) (peripheral): Secondary | ICD-10-CM | POA: Diagnosis not present

## 2021-09-30 DIAGNOSIS — L280 Lichen simplex chronicus: Secondary | ICD-10-CM | POA: Diagnosis not present

## 2021-09-30 DIAGNOSIS — J449 Chronic obstructive pulmonary disease, unspecified: Secondary | ICD-10-CM | POA: Diagnosis not present

## 2021-09-30 DIAGNOSIS — N189 Chronic kidney disease, unspecified: Secondary | ICD-10-CM | POA: Diagnosis not present

## 2021-09-30 DIAGNOSIS — D500 Iron deficiency anemia secondary to blood loss (chronic): Secondary | ICD-10-CM | POA: Diagnosis not present

## 2021-09-30 DIAGNOSIS — L97312 Non-pressure chronic ulcer of right ankle with fat layer exposed: Secondary | ICD-10-CM | POA: Diagnosis not present

## 2021-09-30 DIAGNOSIS — S90812D Abrasion, left foot, subsequent encounter: Secondary | ICD-10-CM | POA: Diagnosis not present

## 2021-09-30 DIAGNOSIS — I739 Peripheral vascular disease, unspecified: Secondary | ICD-10-CM | POA: Diagnosis not present

## 2021-09-30 DIAGNOSIS — L97322 Non-pressure chronic ulcer of left ankle with fat layer exposed: Secondary | ICD-10-CM | POA: Diagnosis not present

## 2021-09-30 DIAGNOSIS — L0100 Impetigo, unspecified: Secondary | ICD-10-CM | POA: Diagnosis not present

## 2021-09-30 DIAGNOSIS — I129 Hypertensive chronic kidney disease with stage 1 through stage 4 chronic kidney disease, or unspecified chronic kidney disease: Secondary | ICD-10-CM | POA: Diagnosis not present

## 2021-09-30 DIAGNOSIS — I872 Venous insufficiency (chronic) (peripheral): Secondary | ICD-10-CM | POA: Diagnosis not present

## 2021-10-04 DIAGNOSIS — I739 Peripheral vascular disease, unspecified: Secondary | ICD-10-CM | POA: Diagnosis not present

## 2021-10-04 DIAGNOSIS — L97312 Non-pressure chronic ulcer of right ankle with fat layer exposed: Secondary | ICD-10-CM | POA: Diagnosis not present

## 2021-10-04 DIAGNOSIS — I129 Hypertensive chronic kidney disease with stage 1 through stage 4 chronic kidney disease, or unspecified chronic kidney disease: Secondary | ICD-10-CM | POA: Diagnosis not present

## 2021-10-04 DIAGNOSIS — J449 Chronic obstructive pulmonary disease, unspecified: Secondary | ICD-10-CM | POA: Diagnosis not present

## 2021-10-04 DIAGNOSIS — D500 Iron deficiency anemia secondary to blood loss (chronic): Secondary | ICD-10-CM | POA: Diagnosis not present

## 2021-10-04 DIAGNOSIS — S90812D Abrasion, left foot, subsequent encounter: Secondary | ICD-10-CM | POA: Diagnosis not present

## 2021-10-04 DIAGNOSIS — L97322 Non-pressure chronic ulcer of left ankle with fat layer exposed: Secondary | ICD-10-CM | POA: Diagnosis not present

## 2021-10-04 DIAGNOSIS — I872 Venous insufficiency (chronic) (peripheral): Secondary | ICD-10-CM | POA: Diagnosis not present

## 2021-10-04 DIAGNOSIS — N189 Chronic kidney disease, unspecified: Secondary | ICD-10-CM | POA: Diagnosis not present

## 2021-10-08 DIAGNOSIS — D500 Iron deficiency anemia secondary to blood loss (chronic): Secondary | ICD-10-CM | POA: Diagnosis not present

## 2021-10-08 DIAGNOSIS — S90812D Abrasion, left foot, subsequent encounter: Secondary | ICD-10-CM | POA: Diagnosis not present

## 2021-10-08 DIAGNOSIS — J449 Chronic obstructive pulmonary disease, unspecified: Secondary | ICD-10-CM | POA: Diagnosis not present

## 2021-10-08 DIAGNOSIS — L97312 Non-pressure chronic ulcer of right ankle with fat layer exposed: Secondary | ICD-10-CM | POA: Diagnosis not present

## 2021-10-08 DIAGNOSIS — L97322 Non-pressure chronic ulcer of left ankle with fat layer exposed: Secondary | ICD-10-CM | POA: Diagnosis not present

## 2021-10-08 DIAGNOSIS — I739 Peripheral vascular disease, unspecified: Secondary | ICD-10-CM | POA: Diagnosis not present

## 2021-10-08 DIAGNOSIS — N189 Chronic kidney disease, unspecified: Secondary | ICD-10-CM | POA: Diagnosis not present

## 2021-10-08 DIAGNOSIS — I872 Venous insufficiency (chronic) (peripheral): Secondary | ICD-10-CM | POA: Diagnosis not present

## 2021-10-08 DIAGNOSIS — I129 Hypertensive chronic kidney disease with stage 1 through stage 4 chronic kidney disease, or unspecified chronic kidney disease: Secondary | ICD-10-CM | POA: Diagnosis not present

## 2021-10-11 DIAGNOSIS — J449 Chronic obstructive pulmonary disease, unspecified: Secondary | ICD-10-CM | POA: Diagnosis not present

## 2021-10-11 DIAGNOSIS — L97322 Non-pressure chronic ulcer of left ankle with fat layer exposed: Secondary | ICD-10-CM | POA: Diagnosis not present

## 2021-10-11 DIAGNOSIS — L97312 Non-pressure chronic ulcer of right ankle with fat layer exposed: Secondary | ICD-10-CM | POA: Diagnosis not present

## 2021-10-11 DIAGNOSIS — N189 Chronic kidney disease, unspecified: Secondary | ICD-10-CM | POA: Diagnosis not present

## 2021-10-11 DIAGNOSIS — D500 Iron deficiency anemia secondary to blood loss (chronic): Secondary | ICD-10-CM | POA: Diagnosis not present

## 2021-10-11 DIAGNOSIS — I129 Hypertensive chronic kidney disease with stage 1 through stage 4 chronic kidney disease, or unspecified chronic kidney disease: Secondary | ICD-10-CM | POA: Diagnosis not present

## 2021-10-11 DIAGNOSIS — I872 Venous insufficiency (chronic) (peripheral): Secondary | ICD-10-CM | POA: Diagnosis not present

## 2021-10-11 DIAGNOSIS — I739 Peripheral vascular disease, unspecified: Secondary | ICD-10-CM | POA: Diagnosis not present

## 2021-10-11 DIAGNOSIS — S90812D Abrasion, left foot, subsequent encounter: Secondary | ICD-10-CM | POA: Diagnosis not present

## 2021-10-13 DIAGNOSIS — J449 Chronic obstructive pulmonary disease, unspecified: Secondary | ICD-10-CM | POA: Diagnosis not present

## 2021-10-13 DIAGNOSIS — I10 Essential (primary) hypertension: Secondary | ICD-10-CM | POA: Diagnosis not present

## 2021-10-13 DIAGNOSIS — F331 Major depressive disorder, recurrent, moderate: Secondary | ICD-10-CM | POA: Diagnosis not present

## 2021-10-13 DIAGNOSIS — K580 Irritable bowel syndrome with diarrhea: Secondary | ICD-10-CM | POA: Diagnosis not present

## 2021-10-13 DIAGNOSIS — R4582 Worries: Secondary | ICD-10-CM | POA: Diagnosis not present

## 2021-10-13 DIAGNOSIS — E782 Mixed hyperlipidemia: Secondary | ICD-10-CM | POA: Diagnosis not present

## 2021-10-13 DIAGNOSIS — I739 Peripheral vascular disease, unspecified: Secondary | ICD-10-CM | POA: Diagnosis not present

## 2021-10-13 DIAGNOSIS — Z1212 Encounter for screening for malignant neoplasm of rectum: Secondary | ICD-10-CM | POA: Diagnosis not present

## 2021-10-13 DIAGNOSIS — D500 Iron deficiency anemia secondary to blood loss (chronic): Secondary | ICD-10-CM | POA: Diagnosis not present

## 2021-10-13 DIAGNOSIS — R7301 Impaired fasting glucose: Secondary | ICD-10-CM | POA: Diagnosis not present

## 2021-10-13 DIAGNOSIS — E7849 Other hyperlipidemia: Secondary | ICD-10-CM | POA: Diagnosis not present

## 2021-10-13 DIAGNOSIS — L280 Lichen simplex chronicus: Secondary | ICD-10-CM | POA: Diagnosis not present

## 2021-10-13 DIAGNOSIS — I872 Venous insufficiency (chronic) (peripheral): Secondary | ICD-10-CM | POA: Diagnosis not present

## 2021-10-15 DIAGNOSIS — M4316 Spondylolisthesis, lumbar region: Secondary | ICD-10-CM | POA: Diagnosis not present

## 2021-10-15 DIAGNOSIS — I70203 Unspecified atherosclerosis of native arteries of extremities, bilateral legs: Secondary | ICD-10-CM | POA: Diagnosis not present

## 2021-10-15 DIAGNOSIS — L97919 Non-pressure chronic ulcer of unspecified part of right lower leg with unspecified severity: Secondary | ICD-10-CM | POA: Diagnosis not present

## 2021-10-15 DIAGNOSIS — I83023 Varicose veins of left lower extremity with ulcer of ankle: Secondary | ICD-10-CM | POA: Diagnosis not present

## 2021-10-15 DIAGNOSIS — I701 Atherosclerosis of renal artery: Secondary | ICD-10-CM | POA: Diagnosis not present

## 2021-10-15 DIAGNOSIS — L97321 Non-pressure chronic ulcer of left ankle limited to breakdown of skin: Secondary | ICD-10-CM | POA: Diagnosis not present

## 2021-10-15 DIAGNOSIS — I83019 Varicose veins of right lower extremity with ulcer of unspecified site: Secondary | ICD-10-CM | POA: Diagnosis not present

## 2021-10-15 DIAGNOSIS — S91301A Unspecified open wound, right foot, initial encounter: Secondary | ICD-10-CM | POA: Diagnosis not present

## 2021-10-15 DIAGNOSIS — I700 Atherosclerosis of aorta: Secondary | ICD-10-CM | POA: Diagnosis not present

## 2021-10-15 DIAGNOSIS — L97222 Non-pressure chronic ulcer of left calf with fat layer exposed: Secondary | ICD-10-CM | POA: Diagnosis not present

## 2021-10-15 DIAGNOSIS — I872 Venous insufficiency (chronic) (peripheral): Secondary | ICD-10-CM | POA: Diagnosis not present

## 2021-10-15 DIAGNOSIS — J449 Chronic obstructive pulmonary disease, unspecified: Secondary | ICD-10-CM | POA: Diagnosis not present

## 2021-10-16 DIAGNOSIS — S90812D Abrasion, left foot, subsequent encounter: Secondary | ICD-10-CM | POA: Diagnosis not present

## 2021-10-16 DIAGNOSIS — L97322 Non-pressure chronic ulcer of left ankle with fat layer exposed: Secondary | ICD-10-CM | POA: Diagnosis not present

## 2021-10-16 DIAGNOSIS — I739 Peripheral vascular disease, unspecified: Secondary | ICD-10-CM | POA: Diagnosis not present

## 2021-10-16 DIAGNOSIS — I129 Hypertensive chronic kidney disease with stage 1 through stage 4 chronic kidney disease, or unspecified chronic kidney disease: Secondary | ICD-10-CM | POA: Diagnosis not present

## 2021-10-16 DIAGNOSIS — N189 Chronic kidney disease, unspecified: Secondary | ICD-10-CM | POA: Diagnosis not present

## 2021-10-16 DIAGNOSIS — D500 Iron deficiency anemia secondary to blood loss (chronic): Secondary | ICD-10-CM | POA: Diagnosis not present

## 2021-10-16 DIAGNOSIS — J449 Chronic obstructive pulmonary disease, unspecified: Secondary | ICD-10-CM | POA: Diagnosis not present

## 2021-10-16 DIAGNOSIS — L97312 Non-pressure chronic ulcer of right ankle with fat layer exposed: Secondary | ICD-10-CM | POA: Diagnosis not present

## 2021-10-16 DIAGNOSIS — I872 Venous insufficiency (chronic) (peripheral): Secondary | ICD-10-CM | POA: Diagnosis not present

## 2021-10-18 DIAGNOSIS — I872 Venous insufficiency (chronic) (peripheral): Secondary | ICD-10-CM | POA: Diagnosis not present

## 2021-10-18 DIAGNOSIS — J449 Chronic obstructive pulmonary disease, unspecified: Secondary | ICD-10-CM | POA: Diagnosis not present

## 2021-10-18 DIAGNOSIS — I739 Peripheral vascular disease, unspecified: Secondary | ICD-10-CM | POA: Diagnosis not present

## 2021-10-18 DIAGNOSIS — S90812D Abrasion, left foot, subsequent encounter: Secondary | ICD-10-CM | POA: Diagnosis not present

## 2021-10-18 DIAGNOSIS — I129 Hypertensive chronic kidney disease with stage 1 through stage 4 chronic kidney disease, or unspecified chronic kidney disease: Secondary | ICD-10-CM | POA: Diagnosis not present

## 2021-10-18 DIAGNOSIS — L97322 Non-pressure chronic ulcer of left ankle with fat layer exposed: Secondary | ICD-10-CM | POA: Diagnosis not present

## 2021-10-18 DIAGNOSIS — N189 Chronic kidney disease, unspecified: Secondary | ICD-10-CM | POA: Diagnosis not present

## 2021-10-18 DIAGNOSIS — L97312 Non-pressure chronic ulcer of right ankle with fat layer exposed: Secondary | ICD-10-CM | POA: Diagnosis not present

## 2021-10-18 DIAGNOSIS — D500 Iron deficiency anemia secondary to blood loss (chronic): Secondary | ICD-10-CM | POA: Diagnosis not present

## 2021-10-21 DIAGNOSIS — I739 Peripheral vascular disease, unspecified: Secondary | ICD-10-CM | POA: Diagnosis not present

## 2021-10-21 DIAGNOSIS — I129 Hypertensive chronic kidney disease with stage 1 through stage 4 chronic kidney disease, or unspecified chronic kidney disease: Secondary | ICD-10-CM | POA: Diagnosis not present

## 2021-10-21 DIAGNOSIS — S90812D Abrasion, left foot, subsequent encounter: Secondary | ICD-10-CM | POA: Diagnosis not present

## 2021-10-21 DIAGNOSIS — J449 Chronic obstructive pulmonary disease, unspecified: Secondary | ICD-10-CM | POA: Diagnosis not present

## 2021-10-21 DIAGNOSIS — L97312 Non-pressure chronic ulcer of right ankle with fat layer exposed: Secondary | ICD-10-CM | POA: Diagnosis not present

## 2021-10-21 DIAGNOSIS — D500 Iron deficiency anemia secondary to blood loss (chronic): Secondary | ICD-10-CM | POA: Diagnosis not present

## 2021-10-21 DIAGNOSIS — L97322 Non-pressure chronic ulcer of left ankle with fat layer exposed: Secondary | ICD-10-CM | POA: Diagnosis not present

## 2021-10-21 DIAGNOSIS — N189 Chronic kidney disease, unspecified: Secondary | ICD-10-CM | POA: Diagnosis not present

## 2021-10-21 DIAGNOSIS — I872 Venous insufficiency (chronic) (peripheral): Secondary | ICD-10-CM | POA: Diagnosis not present

## 2021-10-24 DIAGNOSIS — L97322 Non-pressure chronic ulcer of left ankle with fat layer exposed: Secondary | ICD-10-CM | POA: Diagnosis not present

## 2021-10-24 DIAGNOSIS — K7581 Nonalcoholic steatohepatitis (NASH): Secondary | ICD-10-CM | POA: Diagnosis not present

## 2021-10-24 DIAGNOSIS — I129 Hypertensive chronic kidney disease with stage 1 through stage 4 chronic kidney disease, or unspecified chronic kidney disease: Secondary | ICD-10-CM | POA: Diagnosis not present

## 2021-10-24 DIAGNOSIS — N189 Chronic kidney disease, unspecified: Secondary | ICD-10-CM | POA: Diagnosis not present

## 2021-10-24 DIAGNOSIS — I872 Venous insufficiency (chronic) (peripheral): Secondary | ICD-10-CM | POA: Diagnosis not present

## 2021-10-24 DIAGNOSIS — D500 Iron deficiency anemia secondary to blood loss (chronic): Secondary | ICD-10-CM | POA: Diagnosis not present

## 2021-10-24 DIAGNOSIS — J449 Chronic obstructive pulmonary disease, unspecified: Secondary | ICD-10-CM | POA: Diagnosis not present

## 2021-10-24 DIAGNOSIS — I739 Peripheral vascular disease, unspecified: Secondary | ICD-10-CM | POA: Diagnosis not present

## 2021-10-24 DIAGNOSIS — L97422 Non-pressure chronic ulcer of left heel and midfoot with fat layer exposed: Secondary | ICD-10-CM | POA: Diagnosis not present

## 2021-10-28 DIAGNOSIS — I872 Venous insufficiency (chronic) (peripheral): Secondary | ICD-10-CM | POA: Diagnosis not present

## 2021-10-28 DIAGNOSIS — D500 Iron deficiency anemia secondary to blood loss (chronic): Secondary | ICD-10-CM | POA: Diagnosis not present

## 2021-10-28 DIAGNOSIS — L97322 Non-pressure chronic ulcer of left ankle with fat layer exposed: Secondary | ICD-10-CM | POA: Diagnosis not present

## 2021-10-28 DIAGNOSIS — L309 Dermatitis, unspecified: Secondary | ICD-10-CM | POA: Diagnosis not present

## 2021-10-28 DIAGNOSIS — N189 Chronic kidney disease, unspecified: Secondary | ICD-10-CM | POA: Diagnosis not present

## 2021-10-28 DIAGNOSIS — I739 Peripheral vascular disease, unspecified: Secondary | ICD-10-CM | POA: Diagnosis not present

## 2021-10-28 DIAGNOSIS — L0100 Impetigo, unspecified: Secondary | ICD-10-CM | POA: Diagnosis not present

## 2021-10-28 DIAGNOSIS — I129 Hypertensive chronic kidney disease with stage 1 through stage 4 chronic kidney disease, or unspecified chronic kidney disease: Secondary | ICD-10-CM | POA: Diagnosis not present

## 2021-10-28 DIAGNOSIS — J449 Chronic obstructive pulmonary disease, unspecified: Secondary | ICD-10-CM | POA: Diagnosis not present

## 2021-10-28 DIAGNOSIS — L97422 Non-pressure chronic ulcer of left heel and midfoot with fat layer exposed: Secondary | ICD-10-CM | POA: Diagnosis not present

## 2021-10-28 DIAGNOSIS — K7581 Nonalcoholic steatohepatitis (NASH): Secondary | ICD-10-CM | POA: Diagnosis not present

## 2021-10-29 DIAGNOSIS — I83019 Varicose veins of right lower extremity with ulcer of unspecified site: Secondary | ICD-10-CM | POA: Diagnosis not present

## 2021-10-29 DIAGNOSIS — I700 Atherosclerosis of aorta: Secondary | ICD-10-CM | POA: Diagnosis not present

## 2021-10-29 DIAGNOSIS — R600 Localized edema: Secondary | ICD-10-CM | POA: Diagnosis not present

## 2021-10-29 DIAGNOSIS — L97922 Non-pressure chronic ulcer of unspecified part of left lower leg with fat layer exposed: Secondary | ICD-10-CM | POA: Diagnosis not present

## 2021-10-29 DIAGNOSIS — I70203 Unspecified atherosclerosis of native arteries of extremities, bilateral legs: Secondary | ICD-10-CM | POA: Diagnosis not present

## 2021-10-29 DIAGNOSIS — L97222 Non-pressure chronic ulcer of left calf with fat layer exposed: Secondary | ICD-10-CM | POA: Diagnosis not present

## 2021-10-29 DIAGNOSIS — L97919 Non-pressure chronic ulcer of unspecified part of right lower leg with unspecified severity: Secondary | ICD-10-CM | POA: Diagnosis not present

## 2021-10-29 DIAGNOSIS — I872 Venous insufficiency (chronic) (peripheral): Secondary | ICD-10-CM | POA: Diagnosis not present

## 2021-10-29 DIAGNOSIS — J449 Chronic obstructive pulmonary disease, unspecified: Secondary | ICD-10-CM | POA: Diagnosis not present

## 2021-10-29 DIAGNOSIS — I701 Atherosclerosis of renal artery: Secondary | ICD-10-CM | POA: Diagnosis not present

## 2021-10-29 DIAGNOSIS — L97321 Non-pressure chronic ulcer of left ankle limited to breakdown of skin: Secondary | ICD-10-CM | POA: Diagnosis not present

## 2021-10-29 DIAGNOSIS — I83023 Varicose veins of left lower extremity with ulcer of ankle: Secondary | ICD-10-CM | POA: Diagnosis not present

## 2021-11-04 DIAGNOSIS — K7581 Nonalcoholic steatohepatitis (NASH): Secondary | ICD-10-CM | POA: Diagnosis not present

## 2021-11-04 DIAGNOSIS — J449 Chronic obstructive pulmonary disease, unspecified: Secondary | ICD-10-CM | POA: Diagnosis not present

## 2021-11-04 DIAGNOSIS — I872 Venous insufficiency (chronic) (peripheral): Secondary | ICD-10-CM | POA: Diagnosis not present

## 2021-11-04 DIAGNOSIS — I129 Hypertensive chronic kidney disease with stage 1 through stage 4 chronic kidney disease, or unspecified chronic kidney disease: Secondary | ICD-10-CM | POA: Diagnosis not present

## 2021-11-04 DIAGNOSIS — L97422 Non-pressure chronic ulcer of left heel and midfoot with fat layer exposed: Secondary | ICD-10-CM | POA: Diagnosis not present

## 2021-11-04 DIAGNOSIS — I739 Peripheral vascular disease, unspecified: Secondary | ICD-10-CM | POA: Diagnosis not present

## 2021-11-04 DIAGNOSIS — L97322 Non-pressure chronic ulcer of left ankle with fat layer exposed: Secondary | ICD-10-CM | POA: Diagnosis not present

## 2021-11-04 DIAGNOSIS — N189 Chronic kidney disease, unspecified: Secondary | ICD-10-CM | POA: Diagnosis not present

## 2021-11-04 DIAGNOSIS — D500 Iron deficiency anemia secondary to blood loss (chronic): Secondary | ICD-10-CM | POA: Diagnosis not present

## 2021-11-12 DIAGNOSIS — J449 Chronic obstructive pulmonary disease, unspecified: Secondary | ICD-10-CM | POA: Diagnosis not present

## 2021-11-12 DIAGNOSIS — I70203 Unspecified atherosclerosis of native arteries of extremities, bilateral legs: Secondary | ICD-10-CM | POA: Diagnosis not present

## 2021-11-12 DIAGNOSIS — I890 Lymphedema, not elsewhere classified: Secondary | ICD-10-CM | POA: Diagnosis not present

## 2021-11-12 DIAGNOSIS — I83023 Varicose veins of left lower extremity with ulcer of ankle: Secondary | ICD-10-CM | POA: Diagnosis not present

## 2021-11-12 DIAGNOSIS — I872 Venous insufficiency (chronic) (peripheral): Secondary | ICD-10-CM | POA: Diagnosis not present

## 2021-11-12 DIAGNOSIS — I83019 Varicose veins of right lower extremity with ulcer of unspecified site: Secondary | ICD-10-CM | POA: Diagnosis not present

## 2021-11-12 DIAGNOSIS — I701 Atherosclerosis of renal artery: Secondary | ICD-10-CM | POA: Diagnosis not present

## 2021-11-12 DIAGNOSIS — L97919 Non-pressure chronic ulcer of unspecified part of right lower leg with unspecified severity: Secondary | ICD-10-CM | POA: Diagnosis not present

## 2021-11-12 DIAGNOSIS — L97321 Non-pressure chronic ulcer of left ankle limited to breakdown of skin: Secondary | ICD-10-CM | POA: Diagnosis not present

## 2021-11-12 DIAGNOSIS — L97922 Non-pressure chronic ulcer of unspecified part of left lower leg with fat layer exposed: Secondary | ICD-10-CM | POA: Diagnosis not present

## 2021-11-12 DIAGNOSIS — L97222 Non-pressure chronic ulcer of left calf with fat layer exposed: Secondary | ICD-10-CM | POA: Diagnosis not present

## 2021-11-12 DIAGNOSIS — I700 Atherosclerosis of aorta: Secondary | ICD-10-CM | POA: Diagnosis not present

## 2021-11-25 DIAGNOSIS — I872 Venous insufficiency (chronic) (peripheral): Secondary | ICD-10-CM | POA: Diagnosis not present

## 2021-11-25 DIAGNOSIS — R69 Illness, unspecified: Secondary | ICD-10-CM | POA: Diagnosis not present

## 2021-11-25 DIAGNOSIS — L97511 Non-pressure chronic ulcer of other part of right foot limited to breakdown of skin: Secondary | ICD-10-CM | POA: Diagnosis not present

## 2021-11-25 DIAGNOSIS — I700 Atherosclerosis of aorta: Secondary | ICD-10-CM | POA: Diagnosis not present

## 2021-11-25 DIAGNOSIS — D500 Iron deficiency anemia secondary to blood loss (chronic): Secondary | ICD-10-CM | POA: Diagnosis not present

## 2021-11-25 DIAGNOSIS — K580 Irritable bowel syndrome with diarrhea: Secondary | ICD-10-CM | POA: Diagnosis not present

## 2021-11-25 DIAGNOSIS — Z8616 Personal history of COVID-19: Secondary | ICD-10-CM | POA: Diagnosis not present

## 2021-11-25 DIAGNOSIS — I890 Lymphedema, not elsewhere classified: Secondary | ICD-10-CM | POA: Diagnosis not present

## 2021-11-25 DIAGNOSIS — L97322 Non-pressure chronic ulcer of left ankle with fat layer exposed: Secondary | ICD-10-CM | POA: Diagnosis not present

## 2021-11-25 DIAGNOSIS — I10 Essential (primary) hypertension: Secondary | ICD-10-CM | POA: Diagnosis not present

## 2021-11-25 DIAGNOSIS — J449 Chronic obstructive pulmonary disease, unspecified: Secondary | ICD-10-CM | POA: Diagnosis not present

## 2021-11-25 DIAGNOSIS — F321 Major depressive disorder, single episode, moderate: Secondary | ICD-10-CM | POA: Diagnosis not present

## 2021-11-25 DIAGNOSIS — M810 Age-related osteoporosis without current pathological fracture: Secondary | ICD-10-CM | POA: Diagnosis not present

## 2021-11-25 DIAGNOSIS — Z7982 Long term (current) use of aspirin: Secondary | ICD-10-CM | POA: Diagnosis not present

## 2021-11-25 DIAGNOSIS — F419 Anxiety disorder, unspecified: Secondary | ICD-10-CM | POA: Diagnosis not present

## 2021-11-25 DIAGNOSIS — I739 Peripheral vascular disease, unspecified: Secondary | ICD-10-CM | POA: Diagnosis not present

## 2021-11-25 DIAGNOSIS — Z9181 History of falling: Secondary | ICD-10-CM | POA: Diagnosis not present

## 2021-11-26 DIAGNOSIS — J449 Chronic obstructive pulmonary disease, unspecified: Secondary | ICD-10-CM | POA: Diagnosis not present

## 2021-11-26 DIAGNOSIS — R69 Illness, unspecified: Secondary | ICD-10-CM | POA: Diagnosis not present

## 2021-11-26 DIAGNOSIS — I700 Atherosclerosis of aorta: Secondary | ICD-10-CM | POA: Diagnosis not present

## 2021-11-26 DIAGNOSIS — L97511 Non-pressure chronic ulcer of other part of right foot limited to breakdown of skin: Secondary | ICD-10-CM | POA: Diagnosis not present

## 2021-11-26 DIAGNOSIS — M810 Age-related osteoporosis without current pathological fracture: Secondary | ICD-10-CM | POA: Diagnosis not present

## 2021-11-26 DIAGNOSIS — L97322 Non-pressure chronic ulcer of left ankle with fat layer exposed: Secondary | ICD-10-CM | POA: Diagnosis not present

## 2021-11-26 DIAGNOSIS — I872 Venous insufficiency (chronic) (peripheral): Secondary | ICD-10-CM | POA: Diagnosis not present

## 2021-11-26 DIAGNOSIS — I890 Lymphedema, not elsewhere classified: Secondary | ICD-10-CM | POA: Diagnosis not present

## 2021-11-26 DIAGNOSIS — D500 Iron deficiency anemia secondary to blood loss (chronic): Secondary | ICD-10-CM | POA: Diagnosis not present

## 2021-11-28 DIAGNOSIS — I890 Lymphedema, not elsewhere classified: Secondary | ICD-10-CM | POA: Diagnosis not present

## 2021-11-28 DIAGNOSIS — I700 Atherosclerosis of aorta: Secondary | ICD-10-CM | POA: Diagnosis not present

## 2021-11-28 DIAGNOSIS — I872 Venous insufficiency (chronic) (peripheral): Secondary | ICD-10-CM | POA: Diagnosis not present

## 2021-11-28 DIAGNOSIS — D500 Iron deficiency anemia secondary to blood loss (chronic): Secondary | ICD-10-CM | POA: Diagnosis not present

## 2021-11-28 DIAGNOSIS — J449 Chronic obstructive pulmonary disease, unspecified: Secondary | ICD-10-CM | POA: Diagnosis not present

## 2021-11-28 DIAGNOSIS — M810 Age-related osteoporosis without current pathological fracture: Secondary | ICD-10-CM | POA: Diagnosis not present

## 2021-11-28 DIAGNOSIS — L97511 Non-pressure chronic ulcer of other part of right foot limited to breakdown of skin: Secondary | ICD-10-CM | POA: Diagnosis not present

## 2021-11-28 DIAGNOSIS — L97322 Non-pressure chronic ulcer of left ankle with fat layer exposed: Secondary | ICD-10-CM | POA: Diagnosis not present

## 2021-11-28 DIAGNOSIS — R69 Illness, unspecified: Secondary | ICD-10-CM | POA: Diagnosis not present

## 2021-12-01 DIAGNOSIS — J449 Chronic obstructive pulmonary disease, unspecified: Secondary | ICD-10-CM | POA: Diagnosis not present

## 2021-12-01 DIAGNOSIS — L97322 Non-pressure chronic ulcer of left ankle with fat layer exposed: Secondary | ICD-10-CM | POA: Diagnosis not present

## 2021-12-01 DIAGNOSIS — D500 Iron deficiency anemia secondary to blood loss (chronic): Secondary | ICD-10-CM | POA: Diagnosis not present

## 2021-12-01 DIAGNOSIS — I872 Venous insufficiency (chronic) (peripheral): Secondary | ICD-10-CM | POA: Diagnosis not present

## 2021-12-01 DIAGNOSIS — I890 Lymphedema, not elsewhere classified: Secondary | ICD-10-CM | POA: Diagnosis not present

## 2021-12-01 DIAGNOSIS — M810 Age-related osteoporosis without current pathological fracture: Secondary | ICD-10-CM | POA: Diagnosis not present

## 2021-12-01 DIAGNOSIS — I700 Atherosclerosis of aorta: Secondary | ICD-10-CM | POA: Diagnosis not present

## 2021-12-01 DIAGNOSIS — L97511 Non-pressure chronic ulcer of other part of right foot limited to breakdown of skin: Secondary | ICD-10-CM | POA: Diagnosis not present

## 2021-12-01 DIAGNOSIS — R69 Illness, unspecified: Secondary | ICD-10-CM | POA: Diagnosis not present

## 2021-12-03 DIAGNOSIS — D649 Anemia, unspecified: Secondary | ICD-10-CM | POA: Diagnosis not present

## 2021-12-03 DIAGNOSIS — K219 Gastro-esophageal reflux disease without esophagitis: Secondary | ICD-10-CM | POA: Diagnosis not present

## 2021-12-03 DIAGNOSIS — I83019 Varicose veins of right lower extremity with ulcer of unspecified site: Secondary | ICD-10-CM | POA: Diagnosis not present

## 2021-12-03 DIAGNOSIS — L97321 Non-pressure chronic ulcer of left ankle limited to breakdown of skin: Secondary | ICD-10-CM | POA: Diagnosis not present

## 2021-12-03 DIAGNOSIS — J449 Chronic obstructive pulmonary disease, unspecified: Secondary | ICD-10-CM | POA: Diagnosis not present

## 2021-12-03 DIAGNOSIS — I872 Venous insufficiency (chronic) (peripheral): Secondary | ICD-10-CM | POA: Diagnosis not present

## 2021-12-03 DIAGNOSIS — L97919 Non-pressure chronic ulcer of unspecified part of right lower leg with unspecified severity: Secondary | ICD-10-CM | POA: Diagnosis not present

## 2021-12-03 DIAGNOSIS — R69 Illness, unspecified: Secondary | ICD-10-CM | POA: Diagnosis not present

## 2021-12-03 DIAGNOSIS — Z8616 Personal history of COVID-19: Secondary | ICD-10-CM | POA: Diagnosis not present

## 2021-12-03 DIAGNOSIS — I10 Essential (primary) hypertension: Secondary | ICD-10-CM | POA: Diagnosis not present

## 2021-12-04 DIAGNOSIS — I890 Lymphedema, not elsewhere classified: Secondary | ICD-10-CM | POA: Diagnosis not present

## 2021-12-04 DIAGNOSIS — D500 Iron deficiency anemia secondary to blood loss (chronic): Secondary | ICD-10-CM | POA: Diagnosis not present

## 2021-12-04 DIAGNOSIS — I872 Venous insufficiency (chronic) (peripheral): Secondary | ICD-10-CM | POA: Diagnosis not present

## 2021-12-04 DIAGNOSIS — I700 Atherosclerosis of aorta: Secondary | ICD-10-CM | POA: Diagnosis not present

## 2021-12-04 DIAGNOSIS — L97322 Non-pressure chronic ulcer of left ankle with fat layer exposed: Secondary | ICD-10-CM | POA: Diagnosis not present

## 2021-12-04 DIAGNOSIS — J449 Chronic obstructive pulmonary disease, unspecified: Secondary | ICD-10-CM | POA: Diagnosis not present

## 2021-12-04 DIAGNOSIS — M810 Age-related osteoporosis without current pathological fracture: Secondary | ICD-10-CM | POA: Diagnosis not present

## 2021-12-04 DIAGNOSIS — R69 Illness, unspecified: Secondary | ICD-10-CM | POA: Diagnosis not present

## 2021-12-04 DIAGNOSIS — L97511 Non-pressure chronic ulcer of other part of right foot limited to breakdown of skin: Secondary | ICD-10-CM | POA: Diagnosis not present

## 2021-12-05 DIAGNOSIS — I10 Essential (primary) hypertension: Secondary | ICD-10-CM | POA: Diagnosis not present

## 2021-12-05 DIAGNOSIS — L97919 Non-pressure chronic ulcer of unspecified part of right lower leg with unspecified severity: Secondary | ICD-10-CM | POA: Diagnosis not present

## 2021-12-05 DIAGNOSIS — R69 Illness, unspecified: Secondary | ICD-10-CM | POA: Diagnosis not present

## 2021-12-05 DIAGNOSIS — L97321 Non-pressure chronic ulcer of left ankle limited to breakdown of skin: Secondary | ICD-10-CM | POA: Diagnosis not present

## 2021-12-05 DIAGNOSIS — D649 Anemia, unspecified: Secondary | ICD-10-CM | POA: Diagnosis not present

## 2021-12-05 DIAGNOSIS — K219 Gastro-esophageal reflux disease without esophagitis: Secondary | ICD-10-CM | POA: Diagnosis not present

## 2021-12-05 DIAGNOSIS — Z8616 Personal history of COVID-19: Secondary | ICD-10-CM | POA: Diagnosis not present

## 2021-12-05 DIAGNOSIS — I872 Venous insufficiency (chronic) (peripheral): Secondary | ICD-10-CM | POA: Diagnosis not present

## 2021-12-05 DIAGNOSIS — I83019 Varicose veins of right lower extremity with ulcer of unspecified site: Secondary | ICD-10-CM | POA: Diagnosis not present

## 2021-12-05 DIAGNOSIS — J449 Chronic obstructive pulmonary disease, unspecified: Secondary | ICD-10-CM | POA: Diagnosis not present

## 2021-12-06 DIAGNOSIS — R69 Illness, unspecified: Secondary | ICD-10-CM | POA: Diagnosis not present

## 2021-12-06 DIAGNOSIS — M810 Age-related osteoporosis without current pathological fracture: Secondary | ICD-10-CM | POA: Diagnosis not present

## 2021-12-06 DIAGNOSIS — I890 Lymphedema, not elsewhere classified: Secondary | ICD-10-CM | POA: Diagnosis not present

## 2021-12-06 DIAGNOSIS — I700 Atherosclerosis of aorta: Secondary | ICD-10-CM | POA: Diagnosis not present

## 2021-12-06 DIAGNOSIS — I872 Venous insufficiency (chronic) (peripheral): Secondary | ICD-10-CM | POA: Diagnosis not present

## 2021-12-06 DIAGNOSIS — L97511 Non-pressure chronic ulcer of other part of right foot limited to breakdown of skin: Secondary | ICD-10-CM | POA: Diagnosis not present

## 2021-12-06 DIAGNOSIS — J449 Chronic obstructive pulmonary disease, unspecified: Secondary | ICD-10-CM | POA: Diagnosis not present

## 2021-12-06 DIAGNOSIS — D500 Iron deficiency anemia secondary to blood loss (chronic): Secondary | ICD-10-CM | POA: Diagnosis not present

## 2021-12-06 DIAGNOSIS — L97322 Non-pressure chronic ulcer of left ankle with fat layer exposed: Secondary | ICD-10-CM | POA: Diagnosis not present

## 2021-12-08 DIAGNOSIS — I10 Essential (primary) hypertension: Secondary | ICD-10-CM | POA: Diagnosis not present

## 2021-12-08 DIAGNOSIS — K219 Gastro-esophageal reflux disease without esophagitis: Secondary | ICD-10-CM | POA: Diagnosis not present

## 2021-12-08 DIAGNOSIS — I872 Venous insufficiency (chronic) (peripheral): Secondary | ICD-10-CM | POA: Diagnosis not present

## 2021-12-08 DIAGNOSIS — J449 Chronic obstructive pulmonary disease, unspecified: Secondary | ICD-10-CM | POA: Diagnosis not present

## 2021-12-08 DIAGNOSIS — I83019 Varicose veins of right lower extremity with ulcer of unspecified site: Secondary | ICD-10-CM | POA: Diagnosis not present

## 2021-12-08 DIAGNOSIS — L97321 Non-pressure chronic ulcer of left ankle limited to breakdown of skin: Secondary | ICD-10-CM | POA: Diagnosis not present

## 2021-12-08 DIAGNOSIS — R69 Illness, unspecified: Secondary | ICD-10-CM | POA: Diagnosis not present

## 2021-12-08 DIAGNOSIS — Z8616 Personal history of COVID-19: Secondary | ICD-10-CM | POA: Diagnosis not present

## 2021-12-08 DIAGNOSIS — D649 Anemia, unspecified: Secondary | ICD-10-CM | POA: Diagnosis not present

## 2021-12-08 DIAGNOSIS — L97919 Non-pressure chronic ulcer of unspecified part of right lower leg with unspecified severity: Secondary | ICD-10-CM | POA: Diagnosis not present

## 2021-12-09 DIAGNOSIS — L309 Dermatitis, unspecified: Secondary | ICD-10-CM | POA: Diagnosis not present

## 2021-12-09 DIAGNOSIS — I872 Venous insufficiency (chronic) (peripheral): Secondary | ICD-10-CM | POA: Diagnosis not present

## 2021-12-09 DIAGNOSIS — L308 Other specified dermatitis: Secondary | ICD-10-CM | POA: Diagnosis not present

## 2021-12-09 DIAGNOSIS — R69 Illness, unspecified: Secondary | ICD-10-CM | POA: Diagnosis not present

## 2021-12-09 DIAGNOSIS — J449 Chronic obstructive pulmonary disease, unspecified: Secondary | ICD-10-CM | POA: Diagnosis not present

## 2021-12-09 DIAGNOSIS — L97511 Non-pressure chronic ulcer of other part of right foot limited to breakdown of skin: Secondary | ICD-10-CM | POA: Diagnosis not present

## 2021-12-09 DIAGNOSIS — I890 Lymphedema, not elsewhere classified: Secondary | ICD-10-CM | POA: Diagnosis not present

## 2021-12-09 DIAGNOSIS — D485 Neoplasm of uncertain behavior of skin: Secondary | ICD-10-CM | POA: Diagnosis not present

## 2021-12-09 DIAGNOSIS — I700 Atherosclerosis of aorta: Secondary | ICD-10-CM | POA: Diagnosis not present

## 2021-12-09 DIAGNOSIS — M810 Age-related osteoporosis without current pathological fracture: Secondary | ICD-10-CM | POA: Diagnosis not present

## 2021-12-09 DIAGNOSIS — D500 Iron deficiency anemia secondary to blood loss (chronic): Secondary | ICD-10-CM | POA: Diagnosis not present

## 2021-12-09 DIAGNOSIS — L97322 Non-pressure chronic ulcer of left ankle with fat layer exposed: Secondary | ICD-10-CM | POA: Diagnosis not present

## 2021-12-10 DIAGNOSIS — Z8616 Personal history of COVID-19: Secondary | ICD-10-CM | POA: Diagnosis not present

## 2021-12-10 DIAGNOSIS — D649 Anemia, unspecified: Secondary | ICD-10-CM | POA: Diagnosis not present

## 2021-12-10 DIAGNOSIS — R69 Illness, unspecified: Secondary | ICD-10-CM | POA: Diagnosis not present

## 2021-12-10 DIAGNOSIS — L97919 Non-pressure chronic ulcer of unspecified part of right lower leg with unspecified severity: Secondary | ICD-10-CM | POA: Diagnosis not present

## 2021-12-10 DIAGNOSIS — K219 Gastro-esophageal reflux disease without esophagitis: Secondary | ICD-10-CM | POA: Diagnosis not present

## 2021-12-10 DIAGNOSIS — L97321 Non-pressure chronic ulcer of left ankle limited to breakdown of skin: Secondary | ICD-10-CM | POA: Diagnosis not present

## 2021-12-10 DIAGNOSIS — I872 Venous insufficiency (chronic) (peripheral): Secondary | ICD-10-CM | POA: Diagnosis not present

## 2021-12-10 DIAGNOSIS — J449 Chronic obstructive pulmonary disease, unspecified: Secondary | ICD-10-CM | POA: Diagnosis not present

## 2021-12-10 DIAGNOSIS — I10 Essential (primary) hypertension: Secondary | ICD-10-CM | POA: Diagnosis not present

## 2021-12-10 DIAGNOSIS — I83019 Varicose veins of right lower extremity with ulcer of unspecified site: Secondary | ICD-10-CM | POA: Diagnosis not present

## 2021-12-11 DIAGNOSIS — I872 Venous insufficiency (chronic) (peripheral): Secondary | ICD-10-CM | POA: Diagnosis not present

## 2021-12-11 DIAGNOSIS — I890 Lymphedema, not elsewhere classified: Secondary | ICD-10-CM | POA: Diagnosis not present

## 2021-12-11 DIAGNOSIS — Z6824 Body mass index (BMI) 24.0-24.9, adult: Secondary | ICD-10-CM | POA: Diagnosis not present

## 2021-12-11 DIAGNOSIS — L97511 Non-pressure chronic ulcer of other part of right foot limited to breakdown of skin: Secondary | ICD-10-CM | POA: Diagnosis not present

## 2021-12-11 DIAGNOSIS — L97322 Non-pressure chronic ulcer of left ankle with fat layer exposed: Secondary | ICD-10-CM | POA: Diagnosis not present

## 2021-12-11 DIAGNOSIS — M810 Age-related osteoporosis without current pathological fracture: Secondary | ICD-10-CM | POA: Diagnosis not present

## 2021-12-11 DIAGNOSIS — I700 Atherosclerosis of aorta: Secondary | ICD-10-CM | POA: Diagnosis not present

## 2021-12-11 DIAGNOSIS — J449 Chronic obstructive pulmonary disease, unspecified: Secondary | ICD-10-CM | POA: Diagnosis not present

## 2021-12-11 DIAGNOSIS — D500 Iron deficiency anemia secondary to blood loss (chronic): Secondary | ICD-10-CM | POA: Diagnosis not present

## 2021-12-11 DIAGNOSIS — R21 Rash and other nonspecific skin eruption: Secondary | ICD-10-CM | POA: Diagnosis not present

## 2021-12-11 DIAGNOSIS — R69 Illness, unspecified: Secondary | ICD-10-CM | POA: Diagnosis not present

## 2021-12-12 DIAGNOSIS — D500 Iron deficiency anemia secondary to blood loss (chronic): Secondary | ICD-10-CM | POA: Diagnosis not present

## 2021-12-12 DIAGNOSIS — J449 Chronic obstructive pulmonary disease, unspecified: Secondary | ICD-10-CM | POA: Diagnosis not present

## 2021-12-12 DIAGNOSIS — M810 Age-related osteoporosis without current pathological fracture: Secondary | ICD-10-CM | POA: Diagnosis not present

## 2021-12-12 DIAGNOSIS — I890 Lymphedema, not elsewhere classified: Secondary | ICD-10-CM | POA: Diagnosis not present

## 2021-12-12 DIAGNOSIS — R69 Illness, unspecified: Secondary | ICD-10-CM | POA: Diagnosis not present

## 2021-12-12 DIAGNOSIS — I872 Venous insufficiency (chronic) (peripheral): Secondary | ICD-10-CM | POA: Diagnosis not present

## 2021-12-12 DIAGNOSIS — L97322 Non-pressure chronic ulcer of left ankle with fat layer exposed: Secondary | ICD-10-CM | POA: Diagnosis not present

## 2021-12-12 DIAGNOSIS — L97511 Non-pressure chronic ulcer of other part of right foot limited to breakdown of skin: Secondary | ICD-10-CM | POA: Diagnosis not present

## 2021-12-12 DIAGNOSIS — I700 Atherosclerosis of aorta: Secondary | ICD-10-CM | POA: Diagnosis not present

## 2021-12-16 DIAGNOSIS — L97511 Non-pressure chronic ulcer of other part of right foot limited to breakdown of skin: Secondary | ICD-10-CM | POA: Diagnosis not present

## 2021-12-16 DIAGNOSIS — M810 Age-related osteoporosis without current pathological fracture: Secondary | ICD-10-CM | POA: Diagnosis not present

## 2021-12-16 DIAGNOSIS — I872 Venous insufficiency (chronic) (peripheral): Secondary | ICD-10-CM | POA: Diagnosis not present

## 2021-12-16 DIAGNOSIS — L97322 Non-pressure chronic ulcer of left ankle with fat layer exposed: Secondary | ICD-10-CM | POA: Diagnosis not present

## 2021-12-16 DIAGNOSIS — I890 Lymphedema, not elsewhere classified: Secondary | ICD-10-CM | POA: Diagnosis not present

## 2021-12-16 DIAGNOSIS — R69 Illness, unspecified: Secondary | ICD-10-CM | POA: Diagnosis not present

## 2021-12-16 DIAGNOSIS — D500 Iron deficiency anemia secondary to blood loss (chronic): Secondary | ICD-10-CM | POA: Diagnosis not present

## 2021-12-16 DIAGNOSIS — I700 Atherosclerosis of aorta: Secondary | ICD-10-CM | POA: Diagnosis not present

## 2021-12-16 DIAGNOSIS — J449 Chronic obstructive pulmonary disease, unspecified: Secondary | ICD-10-CM | POA: Diagnosis not present

## 2021-12-18 DIAGNOSIS — I872 Venous insufficiency (chronic) (peripheral): Secondary | ICD-10-CM | POA: Diagnosis not present

## 2021-12-18 DIAGNOSIS — I890 Lymphedema, not elsewhere classified: Secondary | ICD-10-CM | POA: Diagnosis not present

## 2021-12-18 DIAGNOSIS — L97511 Non-pressure chronic ulcer of other part of right foot limited to breakdown of skin: Secondary | ICD-10-CM | POA: Diagnosis not present

## 2021-12-18 DIAGNOSIS — M810 Age-related osteoporosis without current pathological fracture: Secondary | ICD-10-CM | POA: Diagnosis not present

## 2021-12-18 DIAGNOSIS — Z6823 Body mass index (BMI) 23.0-23.9, adult: Secondary | ICD-10-CM | POA: Diagnosis not present

## 2021-12-18 DIAGNOSIS — J449 Chronic obstructive pulmonary disease, unspecified: Secondary | ICD-10-CM | POA: Diagnosis not present

## 2021-12-18 DIAGNOSIS — L97322 Non-pressure chronic ulcer of left ankle with fat layer exposed: Secondary | ICD-10-CM | POA: Diagnosis not present

## 2021-12-18 DIAGNOSIS — R69 Illness, unspecified: Secondary | ICD-10-CM | POA: Diagnosis not present

## 2021-12-18 DIAGNOSIS — I700 Atherosclerosis of aorta: Secondary | ICD-10-CM | POA: Diagnosis not present

## 2021-12-18 DIAGNOSIS — B86 Scabies: Secondary | ICD-10-CM | POA: Diagnosis not present

## 2021-12-18 DIAGNOSIS — J329 Chronic sinusitis, unspecified: Secondary | ICD-10-CM | POA: Diagnosis not present

## 2021-12-18 DIAGNOSIS — D500 Iron deficiency anemia secondary to blood loss (chronic): Secondary | ICD-10-CM | POA: Diagnosis not present

## 2021-12-18 DIAGNOSIS — R059 Cough, unspecified: Secondary | ICD-10-CM | POA: Diagnosis not present

## 2021-12-18 DIAGNOSIS — Z20828 Contact with and (suspected) exposure to other viral communicable diseases: Secondary | ICD-10-CM | POA: Diagnosis not present

## 2021-12-19 DIAGNOSIS — I872 Venous insufficiency (chronic) (peripheral): Secondary | ICD-10-CM | POA: Diagnosis not present

## 2021-12-19 DIAGNOSIS — J449 Chronic obstructive pulmonary disease, unspecified: Secondary | ICD-10-CM | POA: Diagnosis not present

## 2021-12-19 DIAGNOSIS — L97321 Non-pressure chronic ulcer of left ankle limited to breakdown of skin: Secondary | ICD-10-CM | POA: Diagnosis not present

## 2021-12-19 DIAGNOSIS — R69 Illness, unspecified: Secondary | ICD-10-CM | POA: Diagnosis not present

## 2021-12-19 DIAGNOSIS — Z8616 Personal history of COVID-19: Secondary | ICD-10-CM | POA: Diagnosis not present

## 2021-12-19 DIAGNOSIS — L97919 Non-pressure chronic ulcer of unspecified part of right lower leg with unspecified severity: Secondary | ICD-10-CM | POA: Diagnosis not present

## 2021-12-19 DIAGNOSIS — D649 Anemia, unspecified: Secondary | ICD-10-CM | POA: Diagnosis not present

## 2021-12-19 DIAGNOSIS — I10 Essential (primary) hypertension: Secondary | ICD-10-CM | POA: Diagnosis not present

## 2021-12-19 DIAGNOSIS — K219 Gastro-esophageal reflux disease without esophagitis: Secondary | ICD-10-CM | POA: Diagnosis not present

## 2021-12-19 DIAGNOSIS — I83019 Varicose veins of right lower extremity with ulcer of unspecified site: Secondary | ICD-10-CM | POA: Diagnosis not present

## 2021-12-22 DIAGNOSIS — I83019 Varicose veins of right lower extremity with ulcer of unspecified site: Secondary | ICD-10-CM | POA: Diagnosis not present

## 2021-12-22 DIAGNOSIS — L97919 Non-pressure chronic ulcer of unspecified part of right lower leg with unspecified severity: Secondary | ICD-10-CM | POA: Diagnosis not present

## 2021-12-22 DIAGNOSIS — R69 Illness, unspecified: Secondary | ICD-10-CM | POA: Diagnosis not present

## 2021-12-22 DIAGNOSIS — I872 Venous insufficiency (chronic) (peripheral): Secondary | ICD-10-CM | POA: Diagnosis not present

## 2021-12-22 DIAGNOSIS — I10 Essential (primary) hypertension: Secondary | ICD-10-CM | POA: Diagnosis not present

## 2021-12-22 DIAGNOSIS — J449 Chronic obstructive pulmonary disease, unspecified: Secondary | ICD-10-CM | POA: Diagnosis not present

## 2021-12-22 DIAGNOSIS — K219 Gastro-esophageal reflux disease without esophagitis: Secondary | ICD-10-CM | POA: Diagnosis not present

## 2021-12-22 DIAGNOSIS — L97321 Non-pressure chronic ulcer of left ankle limited to breakdown of skin: Secondary | ICD-10-CM | POA: Diagnosis not present

## 2021-12-22 DIAGNOSIS — D649 Anemia, unspecified: Secondary | ICD-10-CM | POA: Diagnosis not present

## 2021-12-22 DIAGNOSIS — Z8616 Personal history of COVID-19: Secondary | ICD-10-CM | POA: Diagnosis not present

## 2021-12-23 DIAGNOSIS — L97511 Non-pressure chronic ulcer of other part of right foot limited to breakdown of skin: Secondary | ICD-10-CM | POA: Diagnosis not present

## 2021-12-23 DIAGNOSIS — R69 Illness, unspecified: Secondary | ICD-10-CM | POA: Diagnosis not present

## 2021-12-23 DIAGNOSIS — I890 Lymphedema, not elsewhere classified: Secondary | ICD-10-CM | POA: Diagnosis not present

## 2021-12-23 DIAGNOSIS — I872 Venous insufficiency (chronic) (peripheral): Secondary | ICD-10-CM | POA: Diagnosis not present

## 2021-12-23 DIAGNOSIS — J449 Chronic obstructive pulmonary disease, unspecified: Secondary | ICD-10-CM | POA: Diagnosis not present

## 2021-12-23 DIAGNOSIS — I700 Atherosclerosis of aorta: Secondary | ICD-10-CM | POA: Diagnosis not present

## 2021-12-23 DIAGNOSIS — M810 Age-related osteoporosis without current pathological fracture: Secondary | ICD-10-CM | POA: Diagnosis not present

## 2021-12-23 DIAGNOSIS — D500 Iron deficiency anemia secondary to blood loss (chronic): Secondary | ICD-10-CM | POA: Diagnosis not present

## 2021-12-23 DIAGNOSIS — L97322 Non-pressure chronic ulcer of left ankle with fat layer exposed: Secondary | ICD-10-CM | POA: Diagnosis not present

## 2021-12-24 DIAGNOSIS — I701 Atherosclerosis of renal artery: Secondary | ICD-10-CM | POA: Diagnosis not present

## 2021-12-24 DIAGNOSIS — I83023 Varicose veins of left lower extremity with ulcer of ankle: Secondary | ICD-10-CM | POA: Diagnosis not present

## 2021-12-24 DIAGNOSIS — I872 Venous insufficiency (chronic) (peripheral): Secondary | ICD-10-CM | POA: Diagnosis not present

## 2021-12-24 DIAGNOSIS — I83019 Varicose veins of right lower extremity with ulcer of unspecified site: Secondary | ICD-10-CM | POA: Diagnosis not present

## 2021-12-24 DIAGNOSIS — I700 Atherosclerosis of aorta: Secondary | ICD-10-CM | POA: Diagnosis not present

## 2021-12-24 DIAGNOSIS — L97329 Non-pressure chronic ulcer of left ankle with unspecified severity: Secondary | ICD-10-CM | POA: Diagnosis not present

## 2021-12-24 DIAGNOSIS — I70203 Unspecified atherosclerosis of native arteries of extremities, bilateral legs: Secondary | ICD-10-CM | POA: Diagnosis not present

## 2021-12-24 DIAGNOSIS — R600 Localized edema: Secondary | ICD-10-CM | POA: Diagnosis not present

## 2021-12-24 DIAGNOSIS — L97222 Non-pressure chronic ulcer of left calf with fat layer exposed: Secondary | ICD-10-CM | POA: Diagnosis not present

## 2021-12-24 DIAGNOSIS — L97919 Non-pressure chronic ulcer of unspecified part of right lower leg with unspecified severity: Secondary | ICD-10-CM | POA: Diagnosis not present

## 2021-12-25 DIAGNOSIS — R69 Illness, unspecified: Secondary | ICD-10-CM | POA: Diagnosis not present

## 2021-12-25 DIAGNOSIS — M810 Age-related osteoporosis without current pathological fracture: Secondary | ICD-10-CM | POA: Diagnosis not present

## 2021-12-25 DIAGNOSIS — L97511 Non-pressure chronic ulcer of other part of right foot limited to breakdown of skin: Secondary | ICD-10-CM | POA: Diagnosis not present

## 2021-12-25 DIAGNOSIS — I872 Venous insufficiency (chronic) (peripheral): Secondary | ICD-10-CM | POA: Diagnosis not present

## 2021-12-25 DIAGNOSIS — D500 Iron deficiency anemia secondary to blood loss (chronic): Secondary | ICD-10-CM | POA: Diagnosis not present

## 2021-12-25 DIAGNOSIS — I890 Lymphedema, not elsewhere classified: Secondary | ICD-10-CM | POA: Diagnosis not present

## 2021-12-25 DIAGNOSIS — L97322 Non-pressure chronic ulcer of left ankle with fat layer exposed: Secondary | ICD-10-CM | POA: Diagnosis not present

## 2021-12-25 DIAGNOSIS — I700 Atherosclerosis of aorta: Secondary | ICD-10-CM | POA: Diagnosis not present

## 2021-12-25 DIAGNOSIS — J449 Chronic obstructive pulmonary disease, unspecified: Secondary | ICD-10-CM | POA: Diagnosis not present

## 2021-12-29 DIAGNOSIS — I890 Lymphedema, not elsewhere classified: Secondary | ICD-10-CM | POA: Diagnosis not present

## 2021-12-29 DIAGNOSIS — L97322 Non-pressure chronic ulcer of left ankle with fat layer exposed: Secondary | ICD-10-CM | POA: Diagnosis not present

## 2021-12-29 DIAGNOSIS — J449 Chronic obstructive pulmonary disease, unspecified: Secondary | ICD-10-CM | POA: Diagnosis not present

## 2021-12-29 DIAGNOSIS — L97511 Non-pressure chronic ulcer of other part of right foot limited to breakdown of skin: Secondary | ICD-10-CM | POA: Diagnosis not present

## 2021-12-29 DIAGNOSIS — I700 Atherosclerosis of aorta: Secondary | ICD-10-CM | POA: Diagnosis not present

## 2021-12-29 DIAGNOSIS — I872 Venous insufficiency (chronic) (peripheral): Secondary | ICD-10-CM | POA: Diagnosis not present

## 2021-12-29 DIAGNOSIS — M810 Age-related osteoporosis without current pathological fracture: Secondary | ICD-10-CM | POA: Diagnosis not present

## 2021-12-29 DIAGNOSIS — D500 Iron deficiency anemia secondary to blood loss (chronic): Secondary | ICD-10-CM | POA: Diagnosis not present

## 2021-12-29 DIAGNOSIS — R69 Illness, unspecified: Secondary | ICD-10-CM | POA: Diagnosis not present

## 2022-01-01 DIAGNOSIS — L97511 Non-pressure chronic ulcer of other part of right foot limited to breakdown of skin: Secondary | ICD-10-CM | POA: Diagnosis not present

## 2022-01-01 DIAGNOSIS — L97322 Non-pressure chronic ulcer of left ankle with fat layer exposed: Secondary | ICD-10-CM | POA: Diagnosis not present

## 2022-01-01 DIAGNOSIS — I700 Atherosclerosis of aorta: Secondary | ICD-10-CM | POA: Diagnosis not present

## 2022-01-01 DIAGNOSIS — I872 Venous insufficiency (chronic) (peripheral): Secondary | ICD-10-CM | POA: Diagnosis not present

## 2022-01-01 DIAGNOSIS — J449 Chronic obstructive pulmonary disease, unspecified: Secondary | ICD-10-CM | POA: Diagnosis not present

## 2022-01-01 DIAGNOSIS — R69 Illness, unspecified: Secondary | ICD-10-CM | POA: Diagnosis not present

## 2022-01-01 DIAGNOSIS — M810 Age-related osteoporosis without current pathological fracture: Secondary | ICD-10-CM | POA: Diagnosis not present

## 2022-01-01 DIAGNOSIS — D500 Iron deficiency anemia secondary to blood loss (chronic): Secondary | ICD-10-CM | POA: Diagnosis not present

## 2022-01-01 DIAGNOSIS — I890 Lymphedema, not elsewhere classified: Secondary | ICD-10-CM | POA: Diagnosis not present

## 2022-01-05 DIAGNOSIS — M79674 Pain in right toe(s): Secondary | ICD-10-CM | POA: Diagnosis not present

## 2022-01-05 DIAGNOSIS — M79675 Pain in left toe(s): Secondary | ICD-10-CM | POA: Diagnosis not present

## 2022-01-05 DIAGNOSIS — B351 Tinea unguium: Secondary | ICD-10-CM | POA: Diagnosis not present

## 2022-01-06 DIAGNOSIS — R69 Illness, unspecified: Secondary | ICD-10-CM | POA: Diagnosis not present

## 2022-01-06 DIAGNOSIS — B353 Tinea pedis: Secondary | ICD-10-CM | POA: Diagnosis not present

## 2022-01-06 DIAGNOSIS — I872 Venous insufficiency (chronic) (peripheral): Secondary | ICD-10-CM | POA: Diagnosis not present

## 2022-01-06 DIAGNOSIS — M810 Age-related osteoporosis without current pathological fracture: Secondary | ICD-10-CM | POA: Diagnosis not present

## 2022-01-06 DIAGNOSIS — I890 Lymphedema, not elsewhere classified: Secondary | ICD-10-CM | POA: Diagnosis not present

## 2022-01-06 DIAGNOSIS — L97322 Non-pressure chronic ulcer of left ankle with fat layer exposed: Secondary | ICD-10-CM | POA: Diagnosis not present

## 2022-01-06 DIAGNOSIS — L97511 Non-pressure chronic ulcer of other part of right foot limited to breakdown of skin: Secondary | ICD-10-CM | POA: Diagnosis not present

## 2022-01-06 DIAGNOSIS — D500 Iron deficiency anemia secondary to blood loss (chronic): Secondary | ICD-10-CM | POA: Diagnosis not present

## 2022-01-06 DIAGNOSIS — I700 Atherosclerosis of aorta: Secondary | ICD-10-CM | POA: Diagnosis not present

## 2022-01-06 DIAGNOSIS — L280 Lichen simplex chronicus: Secondary | ICD-10-CM | POA: Diagnosis not present

## 2022-01-06 DIAGNOSIS — J449 Chronic obstructive pulmonary disease, unspecified: Secondary | ICD-10-CM | POA: Diagnosis not present

## 2022-01-07 DIAGNOSIS — W19XXXA Unspecified fall, initial encounter: Secondary | ICD-10-CM | POA: Diagnosis not present

## 2022-01-07 DIAGNOSIS — M2010 Hallux valgus (acquired), unspecified foot: Secondary | ICD-10-CM | POA: Diagnosis not present

## 2022-01-07 DIAGNOSIS — M19071 Primary osteoarthritis, right ankle and foot: Secondary | ICD-10-CM | POA: Diagnosis not present

## 2022-01-07 DIAGNOSIS — I83023 Varicose veins of left lower extremity with ulcer of ankle: Secondary | ICD-10-CM | POA: Diagnosis not present

## 2022-01-07 DIAGNOSIS — R52 Pain, unspecified: Secondary | ICD-10-CM | POA: Diagnosis not present

## 2022-01-07 DIAGNOSIS — I701 Atherosclerosis of renal artery: Secondary | ICD-10-CM | POA: Diagnosis not present

## 2022-01-07 DIAGNOSIS — M7750 Other enthesopathy of unspecified foot: Secondary | ICD-10-CM | POA: Diagnosis not present

## 2022-01-07 DIAGNOSIS — R609 Edema, unspecified: Secondary | ICD-10-CM | POA: Diagnosis not present

## 2022-01-07 DIAGNOSIS — I83019 Varicose veins of right lower extremity with ulcer of unspecified site: Secondary | ICD-10-CM | POA: Diagnosis not present

## 2022-01-07 DIAGNOSIS — M2011 Hallux valgus (acquired), right foot: Secondary | ICD-10-CM | POA: Diagnosis not present

## 2022-01-07 DIAGNOSIS — M2040 Other hammer toe(s) (acquired), unspecified foot: Secondary | ICD-10-CM | POA: Diagnosis not present

## 2022-01-07 DIAGNOSIS — I70203 Unspecified atherosclerosis of native arteries of extremities, bilateral legs: Secondary | ICD-10-CM | POA: Diagnosis not present

## 2022-01-07 DIAGNOSIS — I872 Venous insufficiency (chronic) (peripheral): Secondary | ICD-10-CM | POA: Diagnosis not present

## 2022-01-07 DIAGNOSIS — L97919 Non-pressure chronic ulcer of unspecified part of right lower leg with unspecified severity: Secondary | ICD-10-CM | POA: Diagnosis not present

## 2022-01-07 DIAGNOSIS — M21619 Bunion of unspecified foot: Secondary | ICD-10-CM | POA: Diagnosis not present

## 2022-01-07 DIAGNOSIS — L97329 Non-pressure chronic ulcer of left ankle with unspecified severity: Secondary | ICD-10-CM | POA: Diagnosis not present

## 2022-01-07 DIAGNOSIS — I700 Atherosclerosis of aorta: Secondary | ICD-10-CM | POA: Diagnosis not present

## 2022-01-07 DIAGNOSIS — M8580 Other specified disorders of bone density and structure, unspecified site: Secondary | ICD-10-CM | POA: Diagnosis not present

## 2022-01-07 DIAGNOSIS — L97222 Non-pressure chronic ulcer of left calf with fat layer exposed: Secondary | ICD-10-CM | POA: Diagnosis not present

## 2022-01-07 DIAGNOSIS — R600 Localized edema: Secondary | ICD-10-CM | POA: Diagnosis not present

## 2022-01-07 DIAGNOSIS — M2041 Other hammer toe(s) (acquired), right foot: Secondary | ICD-10-CM | POA: Diagnosis not present

## 2022-01-09 DIAGNOSIS — L97511 Non-pressure chronic ulcer of other part of right foot limited to breakdown of skin: Secondary | ICD-10-CM | POA: Diagnosis not present

## 2022-01-09 DIAGNOSIS — I872 Venous insufficiency (chronic) (peripheral): Secondary | ICD-10-CM | POA: Diagnosis not present

## 2022-01-09 DIAGNOSIS — J449 Chronic obstructive pulmonary disease, unspecified: Secondary | ICD-10-CM | POA: Diagnosis not present

## 2022-01-09 DIAGNOSIS — I890 Lymphedema, not elsewhere classified: Secondary | ICD-10-CM | POA: Diagnosis not present

## 2022-01-09 DIAGNOSIS — R69 Illness, unspecified: Secondary | ICD-10-CM | POA: Diagnosis not present

## 2022-01-09 DIAGNOSIS — M810 Age-related osteoporosis without current pathological fracture: Secondary | ICD-10-CM | POA: Diagnosis not present

## 2022-01-09 DIAGNOSIS — L97322 Non-pressure chronic ulcer of left ankle with fat layer exposed: Secondary | ICD-10-CM | POA: Diagnosis not present

## 2022-01-09 DIAGNOSIS — I700 Atherosclerosis of aorta: Secondary | ICD-10-CM | POA: Diagnosis not present

## 2022-01-09 DIAGNOSIS — D500 Iron deficiency anemia secondary to blood loss (chronic): Secondary | ICD-10-CM | POA: Diagnosis not present

## 2022-01-12 DIAGNOSIS — R69 Illness, unspecified: Secondary | ICD-10-CM | POA: Diagnosis not present

## 2022-01-12 DIAGNOSIS — J449 Chronic obstructive pulmonary disease, unspecified: Secondary | ICD-10-CM | POA: Diagnosis not present

## 2022-01-12 DIAGNOSIS — L97511 Non-pressure chronic ulcer of other part of right foot limited to breakdown of skin: Secondary | ICD-10-CM | POA: Diagnosis not present

## 2022-01-12 DIAGNOSIS — I700 Atherosclerosis of aorta: Secondary | ICD-10-CM | POA: Diagnosis not present

## 2022-01-12 DIAGNOSIS — M810 Age-related osteoporosis without current pathological fracture: Secondary | ICD-10-CM | POA: Diagnosis not present

## 2022-01-12 DIAGNOSIS — I872 Venous insufficiency (chronic) (peripheral): Secondary | ICD-10-CM | POA: Diagnosis not present

## 2022-01-12 DIAGNOSIS — D500 Iron deficiency anemia secondary to blood loss (chronic): Secondary | ICD-10-CM | POA: Diagnosis not present

## 2022-01-12 DIAGNOSIS — L97322 Non-pressure chronic ulcer of left ankle with fat layer exposed: Secondary | ICD-10-CM | POA: Diagnosis not present

## 2022-01-12 DIAGNOSIS — I890 Lymphedema, not elsewhere classified: Secondary | ICD-10-CM | POA: Diagnosis not present

## 2022-01-13 DIAGNOSIS — N1831 Chronic kidney disease, stage 3a: Secondary | ICD-10-CM | POA: Diagnosis not present

## 2022-01-13 DIAGNOSIS — E7849 Other hyperlipidemia: Secondary | ICD-10-CM | POA: Diagnosis not present

## 2022-01-13 DIAGNOSIS — E782 Mixed hyperlipidemia: Secondary | ICD-10-CM | POA: Diagnosis not present

## 2022-01-13 DIAGNOSIS — E876 Hypokalemia: Secondary | ICD-10-CM | POA: Diagnosis not present

## 2022-01-15 DIAGNOSIS — J449 Chronic obstructive pulmonary disease, unspecified: Secondary | ICD-10-CM | POA: Diagnosis not present

## 2022-01-15 DIAGNOSIS — I872 Venous insufficiency (chronic) (peripheral): Secondary | ICD-10-CM | POA: Diagnosis not present

## 2022-01-15 DIAGNOSIS — I890 Lymphedema, not elsewhere classified: Secondary | ICD-10-CM | POA: Diagnosis not present

## 2022-01-15 DIAGNOSIS — M810 Age-related osteoporosis without current pathological fracture: Secondary | ICD-10-CM | POA: Diagnosis not present

## 2022-01-15 DIAGNOSIS — L97511 Non-pressure chronic ulcer of other part of right foot limited to breakdown of skin: Secondary | ICD-10-CM | POA: Diagnosis not present

## 2022-01-15 DIAGNOSIS — D500 Iron deficiency anemia secondary to blood loss (chronic): Secondary | ICD-10-CM | POA: Diagnosis not present

## 2022-01-15 DIAGNOSIS — R69 Illness, unspecified: Secondary | ICD-10-CM | POA: Diagnosis not present

## 2022-01-15 DIAGNOSIS — I700 Atherosclerosis of aorta: Secondary | ICD-10-CM | POA: Diagnosis not present

## 2022-01-15 DIAGNOSIS — L97322 Non-pressure chronic ulcer of left ankle with fat layer exposed: Secondary | ICD-10-CM | POA: Diagnosis not present

## 2022-01-20 DIAGNOSIS — J449 Chronic obstructive pulmonary disease, unspecified: Secondary | ICD-10-CM | POA: Diagnosis not present

## 2022-01-20 DIAGNOSIS — I700 Atherosclerosis of aorta: Secondary | ICD-10-CM | POA: Diagnosis not present

## 2022-01-20 DIAGNOSIS — M810 Age-related osteoporosis without current pathological fracture: Secondary | ICD-10-CM | POA: Diagnosis not present

## 2022-01-20 DIAGNOSIS — R7301 Impaired fasting glucose: Secondary | ICD-10-CM | POA: Diagnosis not present

## 2022-01-20 DIAGNOSIS — I890 Lymphedema, not elsewhere classified: Secondary | ICD-10-CM | POA: Diagnosis not present

## 2022-01-20 DIAGNOSIS — I10 Essential (primary) hypertension: Secondary | ICD-10-CM | POA: Diagnosis not present

## 2022-01-20 DIAGNOSIS — I872 Venous insufficiency (chronic) (peripheral): Secondary | ICD-10-CM | POA: Diagnosis not present

## 2022-01-20 DIAGNOSIS — E7849 Other hyperlipidemia: Secondary | ICD-10-CM | POA: Diagnosis not present

## 2022-01-20 DIAGNOSIS — R69 Illness, unspecified: Secondary | ICD-10-CM | POA: Diagnosis not present

## 2022-01-20 DIAGNOSIS — L97511 Non-pressure chronic ulcer of other part of right foot limited to breakdown of skin: Secondary | ICD-10-CM | POA: Diagnosis not present

## 2022-01-20 DIAGNOSIS — L280 Lichen simplex chronicus: Secondary | ICD-10-CM | POA: Diagnosis not present

## 2022-01-20 DIAGNOSIS — L97322 Non-pressure chronic ulcer of left ankle with fat layer exposed: Secondary | ICD-10-CM | POA: Diagnosis not present

## 2022-01-20 DIAGNOSIS — D500 Iron deficiency anemia secondary to blood loss (chronic): Secondary | ICD-10-CM | POA: Diagnosis not present

## 2022-01-20 DIAGNOSIS — R4582 Worries: Secondary | ICD-10-CM | POA: Diagnosis not present

## 2022-01-22 DIAGNOSIS — L97919 Non-pressure chronic ulcer of unspecified part of right lower leg with unspecified severity: Secondary | ICD-10-CM | POA: Diagnosis not present

## 2022-01-22 DIAGNOSIS — L97922 Non-pressure chronic ulcer of unspecified part of left lower leg with fat layer exposed: Secondary | ICD-10-CM | POA: Diagnosis not present

## 2022-01-22 DIAGNOSIS — I701 Atherosclerosis of renal artery: Secondary | ICD-10-CM | POA: Diagnosis not present

## 2022-01-22 DIAGNOSIS — J449 Chronic obstructive pulmonary disease, unspecified: Secondary | ICD-10-CM | POA: Diagnosis not present

## 2022-01-22 DIAGNOSIS — L97322 Non-pressure chronic ulcer of left ankle with fat layer exposed: Secondary | ICD-10-CM | POA: Diagnosis not present

## 2022-01-22 DIAGNOSIS — M4316 Spondylolisthesis, lumbar region: Secondary | ICD-10-CM | POA: Diagnosis not present

## 2022-01-22 DIAGNOSIS — I10 Essential (primary) hypertension: Secondary | ICD-10-CM | POA: Diagnosis not present

## 2022-01-22 DIAGNOSIS — I700 Atherosclerosis of aorta: Secondary | ICD-10-CM | POA: Diagnosis not present

## 2022-01-22 DIAGNOSIS — I890 Lymphedema, not elsewhere classified: Secondary | ICD-10-CM | POA: Diagnosis not present

## 2022-01-22 DIAGNOSIS — Z792 Long term (current) use of antibiotics: Secondary | ICD-10-CM | POA: Diagnosis not present

## 2022-01-22 DIAGNOSIS — R69 Illness, unspecified: Secondary | ICD-10-CM | POA: Diagnosis not present

## 2022-01-22 DIAGNOSIS — E1165 Type 2 diabetes mellitus with hyperglycemia: Secondary | ICD-10-CM | POA: Diagnosis not present

## 2022-01-22 DIAGNOSIS — D500 Iron deficiency anemia secondary to blood loss (chronic): Secondary | ICD-10-CM | POA: Diagnosis not present

## 2022-01-22 DIAGNOSIS — I872 Venous insufficiency (chronic) (peripheral): Secondary | ICD-10-CM | POA: Diagnosis not present

## 2022-01-22 DIAGNOSIS — I70203 Unspecified atherosclerosis of native arteries of extremities, bilateral legs: Secondary | ICD-10-CM | POA: Diagnosis not present

## 2022-01-22 DIAGNOSIS — M810 Age-related osteoporosis without current pathological fracture: Secondary | ICD-10-CM | POA: Diagnosis not present

## 2022-01-22 DIAGNOSIS — I8392 Asymptomatic varicose veins of left lower extremity: Secondary | ICD-10-CM | POA: Diagnosis not present

## 2022-01-22 DIAGNOSIS — L97511 Non-pressure chronic ulcer of other part of right foot limited to breakdown of skin: Secondary | ICD-10-CM | POA: Diagnosis not present

## 2022-01-26 DIAGNOSIS — L309 Dermatitis, unspecified: Secondary | ICD-10-CM | POA: Diagnosis not present

## 2022-01-26 DIAGNOSIS — I10 Essential (primary) hypertension: Secondary | ICD-10-CM | POA: Diagnosis not present

## 2022-01-26 DIAGNOSIS — I872 Venous insufficiency (chronic) (peripheral): Secondary | ICD-10-CM | POA: Diagnosis not present

## 2022-01-26 DIAGNOSIS — L97322 Non-pressure chronic ulcer of left ankle with fat layer exposed: Secondary | ICD-10-CM | POA: Diagnosis not present

## 2022-01-26 DIAGNOSIS — J449 Chronic obstructive pulmonary disease, unspecified: Secondary | ICD-10-CM | POA: Diagnosis not present

## 2022-01-26 DIAGNOSIS — I700 Atherosclerosis of aorta: Secondary | ICD-10-CM | POA: Diagnosis not present

## 2022-01-26 DIAGNOSIS — I890 Lymphedema, not elsewhere classified: Secondary | ICD-10-CM | POA: Diagnosis not present

## 2022-01-26 DIAGNOSIS — M810 Age-related osteoporosis without current pathological fracture: Secondary | ICD-10-CM | POA: Diagnosis not present

## 2022-01-26 DIAGNOSIS — D500 Iron deficiency anemia secondary to blood loss (chronic): Secondary | ICD-10-CM | POA: Diagnosis not present

## 2022-01-26 DIAGNOSIS — R69 Illness, unspecified: Secondary | ICD-10-CM | POA: Diagnosis not present

## 2022-01-29 DIAGNOSIS — M810 Age-related osteoporosis without current pathological fracture: Secondary | ICD-10-CM | POA: Diagnosis not present

## 2022-01-29 DIAGNOSIS — I872 Venous insufficiency (chronic) (peripheral): Secondary | ICD-10-CM | POA: Diagnosis not present

## 2022-01-29 DIAGNOSIS — I10 Essential (primary) hypertension: Secondary | ICD-10-CM | POA: Diagnosis not present

## 2022-01-29 DIAGNOSIS — I700 Atherosclerosis of aorta: Secondary | ICD-10-CM | POA: Diagnosis not present

## 2022-01-29 DIAGNOSIS — L97322 Non-pressure chronic ulcer of left ankle with fat layer exposed: Secondary | ICD-10-CM | POA: Diagnosis not present

## 2022-01-29 DIAGNOSIS — D500 Iron deficiency anemia secondary to blood loss (chronic): Secondary | ICD-10-CM | POA: Diagnosis not present

## 2022-01-29 DIAGNOSIS — I890 Lymphedema, not elsewhere classified: Secondary | ICD-10-CM | POA: Diagnosis not present

## 2022-01-29 DIAGNOSIS — R69 Illness, unspecified: Secondary | ICD-10-CM | POA: Diagnosis not present

## 2022-01-29 DIAGNOSIS — J449 Chronic obstructive pulmonary disease, unspecified: Secondary | ICD-10-CM | POA: Diagnosis not present

## 2022-02-03 DIAGNOSIS — M810 Age-related osteoporosis without current pathological fracture: Secondary | ICD-10-CM | POA: Diagnosis not present

## 2022-02-03 DIAGNOSIS — L97322 Non-pressure chronic ulcer of left ankle with fat layer exposed: Secondary | ICD-10-CM | POA: Diagnosis not present

## 2022-02-03 DIAGNOSIS — J449 Chronic obstructive pulmonary disease, unspecified: Secondary | ICD-10-CM | POA: Diagnosis not present

## 2022-02-03 DIAGNOSIS — I890 Lymphedema, not elsewhere classified: Secondary | ICD-10-CM | POA: Diagnosis not present

## 2022-02-03 DIAGNOSIS — D500 Iron deficiency anemia secondary to blood loss (chronic): Secondary | ICD-10-CM | POA: Diagnosis not present

## 2022-02-03 DIAGNOSIS — I10 Essential (primary) hypertension: Secondary | ICD-10-CM | POA: Diagnosis not present

## 2022-02-03 DIAGNOSIS — I872 Venous insufficiency (chronic) (peripheral): Secondary | ICD-10-CM | POA: Diagnosis not present

## 2022-02-03 DIAGNOSIS — I700 Atherosclerosis of aorta: Secondary | ICD-10-CM | POA: Diagnosis not present

## 2022-02-03 DIAGNOSIS — R69 Illness, unspecified: Secondary | ICD-10-CM | POA: Diagnosis not present

## 2022-02-06 DIAGNOSIS — I890 Lymphedema, not elsewhere classified: Secondary | ICD-10-CM | POA: Diagnosis not present

## 2022-02-06 DIAGNOSIS — D500 Iron deficiency anemia secondary to blood loss (chronic): Secondary | ICD-10-CM | POA: Diagnosis not present

## 2022-02-06 DIAGNOSIS — R69 Illness, unspecified: Secondary | ICD-10-CM | POA: Diagnosis not present

## 2022-02-06 DIAGNOSIS — I700 Atherosclerosis of aorta: Secondary | ICD-10-CM | POA: Diagnosis not present

## 2022-02-06 DIAGNOSIS — J449 Chronic obstructive pulmonary disease, unspecified: Secondary | ICD-10-CM | POA: Diagnosis not present

## 2022-02-06 DIAGNOSIS — I872 Venous insufficiency (chronic) (peripheral): Secondary | ICD-10-CM | POA: Diagnosis not present

## 2022-02-06 DIAGNOSIS — L97322 Non-pressure chronic ulcer of left ankle with fat layer exposed: Secondary | ICD-10-CM | POA: Diagnosis not present

## 2022-02-06 DIAGNOSIS — M810 Age-related osteoporosis without current pathological fracture: Secondary | ICD-10-CM | POA: Diagnosis not present

## 2022-02-06 DIAGNOSIS — I10 Essential (primary) hypertension: Secondary | ICD-10-CM | POA: Diagnosis not present

## 2022-02-09 DIAGNOSIS — D500 Iron deficiency anemia secondary to blood loss (chronic): Secondary | ICD-10-CM | POA: Diagnosis not present

## 2022-02-09 DIAGNOSIS — I872 Venous insufficiency (chronic) (peripheral): Secondary | ICD-10-CM | POA: Diagnosis not present

## 2022-02-09 DIAGNOSIS — I700 Atherosclerosis of aorta: Secondary | ICD-10-CM | POA: Diagnosis not present

## 2022-02-09 DIAGNOSIS — M810 Age-related osteoporosis without current pathological fracture: Secondary | ICD-10-CM | POA: Diagnosis not present

## 2022-02-09 DIAGNOSIS — I890 Lymphedema, not elsewhere classified: Secondary | ICD-10-CM | POA: Diagnosis not present

## 2022-02-09 DIAGNOSIS — J449 Chronic obstructive pulmonary disease, unspecified: Secondary | ICD-10-CM | POA: Diagnosis not present

## 2022-02-09 DIAGNOSIS — R69 Illness, unspecified: Secondary | ICD-10-CM | POA: Diagnosis not present

## 2022-02-09 DIAGNOSIS — L97322 Non-pressure chronic ulcer of left ankle with fat layer exposed: Secondary | ICD-10-CM | POA: Diagnosis not present

## 2022-02-09 DIAGNOSIS — I10 Essential (primary) hypertension: Secondary | ICD-10-CM | POA: Diagnosis not present

## 2022-02-12 DIAGNOSIS — I10 Essential (primary) hypertension: Secondary | ICD-10-CM | POA: Diagnosis not present

## 2022-02-12 DIAGNOSIS — M810 Age-related osteoporosis without current pathological fracture: Secondary | ICD-10-CM | POA: Diagnosis not present

## 2022-02-12 DIAGNOSIS — R69 Illness, unspecified: Secondary | ICD-10-CM | POA: Diagnosis not present

## 2022-02-12 DIAGNOSIS — J449 Chronic obstructive pulmonary disease, unspecified: Secondary | ICD-10-CM | POA: Diagnosis not present

## 2022-02-12 DIAGNOSIS — L97322 Non-pressure chronic ulcer of left ankle with fat layer exposed: Secondary | ICD-10-CM | POA: Diagnosis not present

## 2022-02-12 DIAGNOSIS — I700 Atherosclerosis of aorta: Secondary | ICD-10-CM | POA: Diagnosis not present

## 2022-02-12 DIAGNOSIS — D500 Iron deficiency anemia secondary to blood loss (chronic): Secondary | ICD-10-CM | POA: Diagnosis not present

## 2022-02-12 DIAGNOSIS — I872 Venous insufficiency (chronic) (peripheral): Secondary | ICD-10-CM | POA: Diagnosis not present

## 2022-02-12 DIAGNOSIS — I890 Lymphedema, not elsewhere classified: Secondary | ICD-10-CM | POA: Diagnosis not present

## 2022-02-17 DIAGNOSIS — I872 Venous insufficiency (chronic) (peripheral): Secondary | ICD-10-CM | POA: Diagnosis not present

## 2022-02-17 DIAGNOSIS — I10 Essential (primary) hypertension: Secondary | ICD-10-CM | POA: Diagnosis not present

## 2022-02-17 DIAGNOSIS — I890 Lymphedema, not elsewhere classified: Secondary | ICD-10-CM | POA: Diagnosis not present

## 2022-02-17 DIAGNOSIS — J449 Chronic obstructive pulmonary disease, unspecified: Secondary | ICD-10-CM | POA: Diagnosis not present

## 2022-02-17 DIAGNOSIS — I700 Atherosclerosis of aorta: Secondary | ICD-10-CM | POA: Diagnosis not present

## 2022-02-17 DIAGNOSIS — L97322 Non-pressure chronic ulcer of left ankle with fat layer exposed: Secondary | ICD-10-CM | POA: Diagnosis not present

## 2022-02-17 DIAGNOSIS — R69 Illness, unspecified: Secondary | ICD-10-CM | POA: Diagnosis not present

## 2022-02-17 DIAGNOSIS — D500 Iron deficiency anemia secondary to blood loss (chronic): Secondary | ICD-10-CM | POA: Diagnosis not present

## 2022-02-17 DIAGNOSIS — M810 Age-related osteoporosis without current pathological fracture: Secondary | ICD-10-CM | POA: Diagnosis not present

## 2022-02-18 DIAGNOSIS — I8392 Asymptomatic varicose veins of left lower extremity: Secondary | ICD-10-CM | POA: Diagnosis not present

## 2022-02-18 DIAGNOSIS — I700 Atherosclerosis of aorta: Secondary | ICD-10-CM | POA: Diagnosis not present

## 2022-02-18 DIAGNOSIS — I701 Atherosclerosis of renal artery: Secondary | ICD-10-CM | POA: Diagnosis not present

## 2022-02-18 DIAGNOSIS — I83023 Varicose veins of left lower extremity with ulcer of ankle: Secondary | ICD-10-CM | POA: Diagnosis not present

## 2022-02-18 DIAGNOSIS — L97329 Non-pressure chronic ulcer of left ankle with unspecified severity: Secondary | ICD-10-CM | POA: Diagnosis not present

## 2022-02-18 DIAGNOSIS — I890 Lymphedema, not elsewhere classified: Secondary | ICD-10-CM | POA: Diagnosis not present

## 2022-02-18 DIAGNOSIS — L97922 Non-pressure chronic ulcer of unspecified part of left lower leg with fat layer exposed: Secondary | ICD-10-CM | POA: Diagnosis not present

## 2022-02-18 DIAGNOSIS — M4316 Spondylolisthesis, lumbar region: Secondary | ICD-10-CM | POA: Diagnosis not present

## 2022-02-18 DIAGNOSIS — Z792 Long term (current) use of antibiotics: Secondary | ICD-10-CM | POA: Diagnosis not present

## 2022-02-18 DIAGNOSIS — J449 Chronic obstructive pulmonary disease, unspecified: Secondary | ICD-10-CM | POA: Diagnosis not present

## 2022-02-18 DIAGNOSIS — L97919 Non-pressure chronic ulcer of unspecified part of right lower leg with unspecified severity: Secondary | ICD-10-CM | POA: Diagnosis not present

## 2022-02-18 DIAGNOSIS — I872 Venous insufficiency (chronic) (peripheral): Secondary | ICD-10-CM | POA: Diagnosis not present

## 2022-02-18 DIAGNOSIS — I70203 Unspecified atherosclerosis of native arteries of extremities, bilateral legs: Secondary | ICD-10-CM | POA: Diagnosis not present

## 2022-02-19 DIAGNOSIS — L309 Dermatitis, unspecified: Secondary | ICD-10-CM | POA: Diagnosis not present

## 2022-02-20 DIAGNOSIS — R69 Illness, unspecified: Secondary | ICD-10-CM | POA: Diagnosis not present

## 2022-02-20 DIAGNOSIS — J449 Chronic obstructive pulmonary disease, unspecified: Secondary | ICD-10-CM | POA: Diagnosis not present

## 2022-02-20 DIAGNOSIS — I872 Venous insufficiency (chronic) (peripheral): Secondary | ICD-10-CM | POA: Diagnosis not present

## 2022-02-20 DIAGNOSIS — D500 Iron deficiency anemia secondary to blood loss (chronic): Secondary | ICD-10-CM | POA: Diagnosis not present

## 2022-02-20 DIAGNOSIS — L97322 Non-pressure chronic ulcer of left ankle with fat layer exposed: Secondary | ICD-10-CM | POA: Diagnosis not present

## 2022-02-20 DIAGNOSIS — I890 Lymphedema, not elsewhere classified: Secondary | ICD-10-CM | POA: Diagnosis not present

## 2022-02-20 DIAGNOSIS — I10 Essential (primary) hypertension: Secondary | ICD-10-CM | POA: Diagnosis not present

## 2022-02-20 DIAGNOSIS — I700 Atherosclerosis of aorta: Secondary | ICD-10-CM | POA: Diagnosis not present

## 2022-02-20 DIAGNOSIS — M810 Age-related osteoporosis without current pathological fracture: Secondary | ICD-10-CM | POA: Diagnosis not present

## 2022-02-23 DIAGNOSIS — R69 Illness, unspecified: Secondary | ICD-10-CM | POA: Diagnosis not present

## 2022-02-23 DIAGNOSIS — Z7982 Long term (current) use of aspirin: Secondary | ICD-10-CM | POA: Diagnosis not present

## 2022-02-23 DIAGNOSIS — L97322 Non-pressure chronic ulcer of left ankle with fat layer exposed: Secondary | ICD-10-CM | POA: Diagnosis not present

## 2022-02-23 DIAGNOSIS — J449 Chronic obstructive pulmonary disease, unspecified: Secondary | ICD-10-CM | POA: Diagnosis not present

## 2022-02-23 DIAGNOSIS — I10 Essential (primary) hypertension: Secondary | ICD-10-CM | POA: Diagnosis not present

## 2022-02-23 DIAGNOSIS — K580 Irritable bowel syndrome with diarrhea: Secondary | ICD-10-CM | POA: Diagnosis not present

## 2022-02-23 DIAGNOSIS — D500 Iron deficiency anemia secondary to blood loss (chronic): Secondary | ICD-10-CM | POA: Diagnosis not present

## 2022-02-23 DIAGNOSIS — I890 Lymphedema, not elsewhere classified: Secondary | ICD-10-CM | POA: Diagnosis not present

## 2022-02-23 DIAGNOSIS — I700 Atherosclerosis of aorta: Secondary | ICD-10-CM | POA: Diagnosis not present

## 2022-02-23 DIAGNOSIS — Z8616 Personal history of COVID-19: Secondary | ICD-10-CM | POA: Diagnosis not present

## 2022-02-23 DIAGNOSIS — Z9181 History of falling: Secondary | ICD-10-CM | POA: Diagnosis not present

## 2022-02-23 DIAGNOSIS — I872 Venous insufficiency (chronic) (peripheral): Secondary | ICD-10-CM | POA: Diagnosis not present

## 2022-02-23 DIAGNOSIS — M810 Age-related osteoporosis without current pathological fracture: Secondary | ICD-10-CM | POA: Diagnosis not present

## 2022-02-26 DIAGNOSIS — R69 Illness, unspecified: Secondary | ICD-10-CM | POA: Diagnosis not present

## 2022-02-26 DIAGNOSIS — I10 Essential (primary) hypertension: Secondary | ICD-10-CM | POA: Diagnosis not present

## 2022-02-26 DIAGNOSIS — J449 Chronic obstructive pulmonary disease, unspecified: Secondary | ICD-10-CM | POA: Diagnosis not present

## 2022-02-26 DIAGNOSIS — Z9181 History of falling: Secondary | ICD-10-CM | POA: Diagnosis not present

## 2022-02-26 DIAGNOSIS — M810 Age-related osteoporosis without current pathological fracture: Secondary | ICD-10-CM | POA: Diagnosis not present

## 2022-02-26 DIAGNOSIS — L97322 Non-pressure chronic ulcer of left ankle with fat layer exposed: Secondary | ICD-10-CM | POA: Diagnosis not present

## 2022-02-26 DIAGNOSIS — K580 Irritable bowel syndrome with diarrhea: Secondary | ICD-10-CM | POA: Diagnosis not present

## 2022-02-26 DIAGNOSIS — I872 Venous insufficiency (chronic) (peripheral): Secondary | ICD-10-CM | POA: Diagnosis not present

## 2022-02-26 DIAGNOSIS — I700 Atherosclerosis of aorta: Secondary | ICD-10-CM | POA: Diagnosis not present

## 2022-02-26 DIAGNOSIS — I890 Lymphedema, not elsewhere classified: Secondary | ICD-10-CM | POA: Diagnosis not present

## 2022-02-26 DIAGNOSIS — Z8616 Personal history of COVID-19: Secondary | ICD-10-CM | POA: Diagnosis not present

## 2022-02-26 DIAGNOSIS — Z7982 Long term (current) use of aspirin: Secondary | ICD-10-CM | POA: Diagnosis not present

## 2022-02-26 DIAGNOSIS — D500 Iron deficiency anemia secondary to blood loss (chronic): Secondary | ICD-10-CM | POA: Diagnosis not present

## 2022-03-03 DIAGNOSIS — I700 Atherosclerosis of aorta: Secondary | ICD-10-CM | POA: Diagnosis not present

## 2022-03-03 DIAGNOSIS — D500 Iron deficiency anemia secondary to blood loss (chronic): Secondary | ICD-10-CM | POA: Diagnosis not present

## 2022-03-03 DIAGNOSIS — Z7982 Long term (current) use of aspirin: Secondary | ICD-10-CM | POA: Diagnosis not present

## 2022-03-03 DIAGNOSIS — R69 Illness, unspecified: Secondary | ICD-10-CM | POA: Diagnosis not present

## 2022-03-03 DIAGNOSIS — Z8616 Personal history of COVID-19: Secondary | ICD-10-CM | POA: Diagnosis not present

## 2022-03-03 DIAGNOSIS — I872 Venous insufficiency (chronic) (peripheral): Secondary | ICD-10-CM | POA: Diagnosis not present

## 2022-03-03 DIAGNOSIS — J449 Chronic obstructive pulmonary disease, unspecified: Secondary | ICD-10-CM | POA: Diagnosis not present

## 2022-03-03 DIAGNOSIS — I890 Lymphedema, not elsewhere classified: Secondary | ICD-10-CM | POA: Diagnosis not present

## 2022-03-03 DIAGNOSIS — M810 Age-related osteoporosis without current pathological fracture: Secondary | ICD-10-CM | POA: Diagnosis not present

## 2022-03-03 DIAGNOSIS — I10 Essential (primary) hypertension: Secondary | ICD-10-CM | POA: Diagnosis not present

## 2022-03-03 DIAGNOSIS — L97322 Non-pressure chronic ulcer of left ankle with fat layer exposed: Secondary | ICD-10-CM | POA: Diagnosis not present

## 2022-03-03 DIAGNOSIS — Z9181 History of falling: Secondary | ICD-10-CM | POA: Diagnosis not present

## 2022-03-03 DIAGNOSIS — K580 Irritable bowel syndrome with diarrhea: Secondary | ICD-10-CM | POA: Diagnosis not present

## 2022-03-06 DIAGNOSIS — I10 Essential (primary) hypertension: Secondary | ICD-10-CM | POA: Diagnosis not present

## 2022-03-06 DIAGNOSIS — R69 Illness, unspecified: Secondary | ICD-10-CM | POA: Diagnosis not present

## 2022-03-06 DIAGNOSIS — D500 Iron deficiency anemia secondary to blood loss (chronic): Secondary | ICD-10-CM | POA: Diagnosis not present

## 2022-03-06 DIAGNOSIS — I700 Atherosclerosis of aorta: Secondary | ICD-10-CM | POA: Diagnosis not present

## 2022-03-06 DIAGNOSIS — L97322 Non-pressure chronic ulcer of left ankle with fat layer exposed: Secondary | ICD-10-CM | POA: Diagnosis not present

## 2022-03-06 DIAGNOSIS — Z7982 Long term (current) use of aspirin: Secondary | ICD-10-CM | POA: Diagnosis not present

## 2022-03-06 DIAGNOSIS — Z8616 Personal history of COVID-19: Secondary | ICD-10-CM | POA: Diagnosis not present

## 2022-03-06 DIAGNOSIS — M810 Age-related osteoporosis without current pathological fracture: Secondary | ICD-10-CM | POA: Diagnosis not present

## 2022-03-06 DIAGNOSIS — K580 Irritable bowel syndrome with diarrhea: Secondary | ICD-10-CM | POA: Diagnosis not present

## 2022-03-06 DIAGNOSIS — Z9181 History of falling: Secondary | ICD-10-CM | POA: Diagnosis not present

## 2022-03-06 DIAGNOSIS — J449 Chronic obstructive pulmonary disease, unspecified: Secondary | ICD-10-CM | POA: Diagnosis not present

## 2022-03-06 DIAGNOSIS — I890 Lymphedema, not elsewhere classified: Secondary | ICD-10-CM | POA: Diagnosis not present

## 2022-03-06 DIAGNOSIS — I872 Venous insufficiency (chronic) (peripheral): Secondary | ICD-10-CM | POA: Diagnosis not present

## 2022-03-09 DIAGNOSIS — R69 Illness, unspecified: Secondary | ICD-10-CM | POA: Diagnosis not present

## 2022-03-09 DIAGNOSIS — K580 Irritable bowel syndrome with diarrhea: Secondary | ICD-10-CM | POA: Diagnosis not present

## 2022-03-09 DIAGNOSIS — I890 Lymphedema, not elsewhere classified: Secondary | ICD-10-CM | POA: Diagnosis not present

## 2022-03-09 DIAGNOSIS — Z9181 History of falling: Secondary | ICD-10-CM | POA: Diagnosis not present

## 2022-03-09 DIAGNOSIS — D500 Iron deficiency anemia secondary to blood loss (chronic): Secondary | ICD-10-CM | POA: Diagnosis not present

## 2022-03-09 DIAGNOSIS — J449 Chronic obstructive pulmonary disease, unspecified: Secondary | ICD-10-CM | POA: Diagnosis not present

## 2022-03-09 DIAGNOSIS — L97322 Non-pressure chronic ulcer of left ankle with fat layer exposed: Secondary | ICD-10-CM | POA: Diagnosis not present

## 2022-03-09 DIAGNOSIS — I700 Atherosclerosis of aorta: Secondary | ICD-10-CM | POA: Diagnosis not present

## 2022-03-09 DIAGNOSIS — M810 Age-related osteoporosis without current pathological fracture: Secondary | ICD-10-CM | POA: Diagnosis not present

## 2022-03-09 DIAGNOSIS — I872 Venous insufficiency (chronic) (peripheral): Secondary | ICD-10-CM | POA: Diagnosis not present

## 2022-03-09 DIAGNOSIS — Z7982 Long term (current) use of aspirin: Secondary | ICD-10-CM | POA: Diagnosis not present

## 2022-03-09 DIAGNOSIS — I10 Essential (primary) hypertension: Secondary | ICD-10-CM | POA: Diagnosis not present

## 2022-03-09 DIAGNOSIS — Z8616 Personal history of COVID-19: Secondary | ICD-10-CM | POA: Diagnosis not present

## 2022-03-12 DIAGNOSIS — J449 Chronic obstructive pulmonary disease, unspecified: Secondary | ICD-10-CM | POA: Diagnosis not present

## 2022-03-12 DIAGNOSIS — I890 Lymphedema, not elsewhere classified: Secondary | ICD-10-CM | POA: Diagnosis not present

## 2022-03-12 DIAGNOSIS — Z8616 Personal history of COVID-19: Secondary | ICD-10-CM | POA: Diagnosis not present

## 2022-03-12 DIAGNOSIS — R69 Illness, unspecified: Secondary | ICD-10-CM | POA: Diagnosis not present

## 2022-03-12 DIAGNOSIS — D500 Iron deficiency anemia secondary to blood loss (chronic): Secondary | ICD-10-CM | POA: Diagnosis not present

## 2022-03-12 DIAGNOSIS — Z7982 Long term (current) use of aspirin: Secondary | ICD-10-CM | POA: Diagnosis not present

## 2022-03-12 DIAGNOSIS — Z9181 History of falling: Secondary | ICD-10-CM | POA: Diagnosis not present

## 2022-03-12 DIAGNOSIS — I872 Venous insufficiency (chronic) (peripheral): Secondary | ICD-10-CM | POA: Diagnosis not present

## 2022-03-12 DIAGNOSIS — M810 Age-related osteoporosis without current pathological fracture: Secondary | ICD-10-CM | POA: Diagnosis not present

## 2022-03-12 DIAGNOSIS — I700 Atherosclerosis of aorta: Secondary | ICD-10-CM | POA: Diagnosis not present

## 2022-03-12 DIAGNOSIS — K580 Irritable bowel syndrome with diarrhea: Secondary | ICD-10-CM | POA: Diagnosis not present

## 2022-03-12 DIAGNOSIS — L97322 Non-pressure chronic ulcer of left ankle with fat layer exposed: Secondary | ICD-10-CM | POA: Diagnosis not present

## 2022-03-12 DIAGNOSIS — I10 Essential (primary) hypertension: Secondary | ICD-10-CM | POA: Diagnosis not present

## 2022-03-16 DIAGNOSIS — J449 Chronic obstructive pulmonary disease, unspecified: Secondary | ICD-10-CM | POA: Diagnosis not present

## 2022-03-16 DIAGNOSIS — Z20828 Contact with and (suspected) exposure to other viral communicable diseases: Secondary | ICD-10-CM | POA: Diagnosis not present

## 2022-03-16 DIAGNOSIS — J019 Acute sinusitis, unspecified: Secondary | ICD-10-CM | POA: Diagnosis not present

## 2022-03-16 DIAGNOSIS — Z6822 Body mass index (BMI) 22.0-22.9, adult: Secondary | ICD-10-CM | POA: Diagnosis not present

## 2022-03-17 DIAGNOSIS — J449 Chronic obstructive pulmonary disease, unspecified: Secondary | ICD-10-CM | POA: Diagnosis not present

## 2022-03-17 DIAGNOSIS — I872 Venous insufficiency (chronic) (peripheral): Secondary | ICD-10-CM | POA: Diagnosis not present

## 2022-03-17 DIAGNOSIS — L97322 Non-pressure chronic ulcer of left ankle with fat layer exposed: Secondary | ICD-10-CM | POA: Diagnosis not present

## 2022-03-17 DIAGNOSIS — R69 Illness, unspecified: Secondary | ICD-10-CM | POA: Diagnosis not present

## 2022-03-17 DIAGNOSIS — K580 Irritable bowel syndrome with diarrhea: Secondary | ICD-10-CM | POA: Diagnosis not present

## 2022-03-17 DIAGNOSIS — I700 Atherosclerosis of aorta: Secondary | ICD-10-CM | POA: Diagnosis not present

## 2022-03-17 DIAGNOSIS — Z8616 Personal history of COVID-19: Secondary | ICD-10-CM | POA: Diagnosis not present

## 2022-03-17 DIAGNOSIS — D500 Iron deficiency anemia secondary to blood loss (chronic): Secondary | ICD-10-CM | POA: Diagnosis not present

## 2022-03-17 DIAGNOSIS — Z7982 Long term (current) use of aspirin: Secondary | ICD-10-CM | POA: Diagnosis not present

## 2022-03-17 DIAGNOSIS — Z9181 History of falling: Secondary | ICD-10-CM | POA: Diagnosis not present

## 2022-03-17 DIAGNOSIS — I10 Essential (primary) hypertension: Secondary | ICD-10-CM | POA: Diagnosis not present

## 2022-03-17 DIAGNOSIS — I890 Lymphedema, not elsewhere classified: Secondary | ICD-10-CM | POA: Diagnosis not present

## 2022-03-17 DIAGNOSIS — M810 Age-related osteoporosis without current pathological fracture: Secondary | ICD-10-CM | POA: Diagnosis not present

## 2022-03-18 DIAGNOSIS — Z7983 Long term (current) use of bisphosphonates: Secondary | ICD-10-CM | POA: Diagnosis not present

## 2022-03-18 DIAGNOSIS — K219 Gastro-esophageal reflux disease without esophagitis: Secondary | ICD-10-CM | POA: Diagnosis not present

## 2022-03-18 DIAGNOSIS — D649 Anemia, unspecified: Secondary | ICD-10-CM | POA: Diagnosis not present

## 2022-03-18 DIAGNOSIS — Z87891 Personal history of nicotine dependence: Secondary | ICD-10-CM | POA: Diagnosis not present

## 2022-03-18 DIAGNOSIS — K4090 Unilateral inguinal hernia, without obstruction or gangrene, not specified as recurrent: Secondary | ICD-10-CM | POA: Diagnosis not present

## 2022-03-18 DIAGNOSIS — R69 Illness, unspecified: Secondary | ICD-10-CM | POA: Diagnosis not present

## 2022-03-18 DIAGNOSIS — I10 Essential (primary) hypertension: Secondary | ICD-10-CM | POA: Diagnosis not present

## 2022-03-18 DIAGNOSIS — M1990 Unspecified osteoarthritis, unspecified site: Secondary | ICD-10-CM | POA: Diagnosis not present

## 2022-03-18 DIAGNOSIS — I701 Atherosclerosis of renal artery: Secondary | ICD-10-CM | POA: Diagnosis not present

## 2022-03-18 DIAGNOSIS — Z792 Long term (current) use of antibiotics: Secondary | ICD-10-CM | POA: Diagnosis not present

## 2022-03-18 DIAGNOSIS — I872 Venous insufficiency (chronic) (peripheral): Secondary | ICD-10-CM | POA: Diagnosis not present

## 2022-03-18 DIAGNOSIS — R600 Localized edema: Secondary | ICD-10-CM | POA: Diagnosis not present

## 2022-03-18 DIAGNOSIS — F419 Anxiety disorder, unspecified: Secondary | ICD-10-CM | POA: Diagnosis not present

## 2022-03-18 DIAGNOSIS — K838 Other specified diseases of biliary tract: Secondary | ICD-10-CM | POA: Diagnosis not present

## 2022-03-18 DIAGNOSIS — M4316 Spondylolisthesis, lumbar region: Secondary | ICD-10-CM | POA: Diagnosis not present

## 2022-03-18 DIAGNOSIS — Z8616 Personal history of COVID-19: Secondary | ICD-10-CM | POA: Diagnosis not present

## 2022-03-18 DIAGNOSIS — I700 Atherosclerosis of aorta: Secondary | ICD-10-CM | POA: Diagnosis not present

## 2022-03-18 DIAGNOSIS — I70203 Unspecified atherosclerosis of native arteries of extremities, bilateral legs: Secondary | ICD-10-CM | POA: Diagnosis not present

## 2022-03-18 DIAGNOSIS — L97922 Non-pressure chronic ulcer of unspecified part of left lower leg with fat layer exposed: Secondary | ICD-10-CM | POA: Diagnosis not present

## 2022-03-18 DIAGNOSIS — M5136 Other intervertebral disc degeneration, lumbar region: Secondary | ICD-10-CM | POA: Diagnosis not present

## 2022-03-18 DIAGNOSIS — Z7982 Long term (current) use of aspirin: Secondary | ICD-10-CM | POA: Diagnosis not present

## 2022-03-18 DIAGNOSIS — L97222 Non-pressure chronic ulcer of left calf with fat layer exposed: Secondary | ICD-10-CM | POA: Diagnosis not present

## 2022-03-18 DIAGNOSIS — M810 Age-related osteoporosis without current pathological fracture: Secondary | ICD-10-CM | POA: Diagnosis not present

## 2022-03-18 DIAGNOSIS — I890 Lymphedema, not elsewhere classified: Secondary | ICD-10-CM | POA: Diagnosis not present

## 2022-03-18 DIAGNOSIS — J449 Chronic obstructive pulmonary disease, unspecified: Secondary | ICD-10-CM | POA: Diagnosis not present

## 2022-03-18 DIAGNOSIS — I8392 Asymptomatic varicose veins of left lower extremity: Secondary | ICD-10-CM | POA: Diagnosis not present

## 2022-03-20 DIAGNOSIS — M810 Age-related osteoporosis without current pathological fracture: Secondary | ICD-10-CM | POA: Diagnosis not present

## 2022-03-20 DIAGNOSIS — I10 Essential (primary) hypertension: Secondary | ICD-10-CM | POA: Diagnosis not present

## 2022-03-20 DIAGNOSIS — D500 Iron deficiency anemia secondary to blood loss (chronic): Secondary | ICD-10-CM | POA: Diagnosis not present

## 2022-03-20 DIAGNOSIS — R69 Illness, unspecified: Secondary | ICD-10-CM | POA: Diagnosis not present

## 2022-03-20 DIAGNOSIS — L97322 Non-pressure chronic ulcer of left ankle with fat layer exposed: Secondary | ICD-10-CM | POA: Diagnosis not present

## 2022-03-20 DIAGNOSIS — I890 Lymphedema, not elsewhere classified: Secondary | ICD-10-CM | POA: Diagnosis not present

## 2022-03-20 DIAGNOSIS — I872 Venous insufficiency (chronic) (peripheral): Secondary | ICD-10-CM | POA: Diagnosis not present

## 2022-03-20 DIAGNOSIS — I700 Atherosclerosis of aorta: Secondary | ICD-10-CM | POA: Diagnosis not present

## 2022-03-20 DIAGNOSIS — Z9181 History of falling: Secondary | ICD-10-CM | POA: Diagnosis not present

## 2022-03-20 DIAGNOSIS — Z8616 Personal history of COVID-19: Secondary | ICD-10-CM | POA: Diagnosis not present

## 2022-03-20 DIAGNOSIS — J449 Chronic obstructive pulmonary disease, unspecified: Secondary | ICD-10-CM | POA: Diagnosis not present

## 2022-03-20 DIAGNOSIS — Z7982 Long term (current) use of aspirin: Secondary | ICD-10-CM | POA: Diagnosis not present

## 2022-03-20 DIAGNOSIS — K580 Irritable bowel syndrome with diarrhea: Secondary | ICD-10-CM | POA: Diagnosis not present

## 2022-03-23 DIAGNOSIS — I890 Lymphedema, not elsewhere classified: Secondary | ICD-10-CM | POA: Diagnosis not present

## 2022-03-23 DIAGNOSIS — K580 Irritable bowel syndrome with diarrhea: Secondary | ICD-10-CM | POA: Diagnosis not present

## 2022-03-23 DIAGNOSIS — M810 Age-related osteoporosis without current pathological fracture: Secondary | ICD-10-CM | POA: Diagnosis not present

## 2022-03-23 DIAGNOSIS — R69 Illness, unspecified: Secondary | ICD-10-CM | POA: Diagnosis not present

## 2022-03-23 DIAGNOSIS — L97322 Non-pressure chronic ulcer of left ankle with fat layer exposed: Secondary | ICD-10-CM | POA: Diagnosis not present

## 2022-03-23 DIAGNOSIS — I872 Venous insufficiency (chronic) (peripheral): Secondary | ICD-10-CM | POA: Diagnosis not present

## 2022-03-23 DIAGNOSIS — D500 Iron deficiency anemia secondary to blood loss (chronic): Secondary | ICD-10-CM | POA: Diagnosis not present

## 2022-03-23 DIAGNOSIS — I700 Atherosclerosis of aorta: Secondary | ICD-10-CM | POA: Diagnosis not present

## 2022-03-23 DIAGNOSIS — Z7982 Long term (current) use of aspirin: Secondary | ICD-10-CM | POA: Diagnosis not present

## 2022-03-23 DIAGNOSIS — Z9181 History of falling: Secondary | ICD-10-CM | POA: Diagnosis not present

## 2022-03-23 DIAGNOSIS — J449 Chronic obstructive pulmonary disease, unspecified: Secondary | ICD-10-CM | POA: Diagnosis not present

## 2022-03-23 DIAGNOSIS — I10 Essential (primary) hypertension: Secondary | ICD-10-CM | POA: Diagnosis not present

## 2022-03-23 DIAGNOSIS — Z8616 Personal history of COVID-19: Secondary | ICD-10-CM | POA: Diagnosis not present

## 2022-03-25 DIAGNOSIS — I890 Lymphedema, not elsewhere classified: Secondary | ICD-10-CM | POA: Diagnosis not present

## 2022-03-25 DIAGNOSIS — I700 Atherosclerosis of aorta: Secondary | ICD-10-CM | POA: Diagnosis not present

## 2022-03-25 DIAGNOSIS — R69 Illness, unspecified: Secondary | ICD-10-CM | POA: Diagnosis not present

## 2022-03-25 DIAGNOSIS — M810 Age-related osteoporosis without current pathological fracture: Secondary | ICD-10-CM | POA: Diagnosis not present

## 2022-03-25 DIAGNOSIS — D500 Iron deficiency anemia secondary to blood loss (chronic): Secondary | ICD-10-CM | POA: Diagnosis not present

## 2022-03-25 DIAGNOSIS — Z9181 History of falling: Secondary | ICD-10-CM | POA: Diagnosis not present

## 2022-03-25 DIAGNOSIS — J449 Chronic obstructive pulmonary disease, unspecified: Secondary | ICD-10-CM | POA: Diagnosis not present

## 2022-03-25 DIAGNOSIS — Z7982 Long term (current) use of aspirin: Secondary | ICD-10-CM | POA: Diagnosis not present

## 2022-03-25 DIAGNOSIS — I872 Venous insufficiency (chronic) (peripheral): Secondary | ICD-10-CM | POA: Diagnosis not present

## 2022-03-25 DIAGNOSIS — L97322 Non-pressure chronic ulcer of left ankle with fat layer exposed: Secondary | ICD-10-CM | POA: Diagnosis not present

## 2022-03-25 DIAGNOSIS — K580 Irritable bowel syndrome with diarrhea: Secondary | ICD-10-CM | POA: Diagnosis not present

## 2022-03-25 DIAGNOSIS — Z8616 Personal history of COVID-19: Secondary | ICD-10-CM | POA: Diagnosis not present

## 2022-03-25 DIAGNOSIS — I10 Essential (primary) hypertension: Secondary | ICD-10-CM | POA: Diagnosis not present

## 2022-03-27 DIAGNOSIS — J449 Chronic obstructive pulmonary disease, unspecified: Secondary | ICD-10-CM | POA: Diagnosis not present

## 2022-03-27 DIAGNOSIS — Z8616 Personal history of COVID-19: Secondary | ICD-10-CM | POA: Diagnosis not present

## 2022-03-27 DIAGNOSIS — I890 Lymphedema, not elsewhere classified: Secondary | ICD-10-CM | POA: Diagnosis not present

## 2022-03-27 DIAGNOSIS — I10 Essential (primary) hypertension: Secondary | ICD-10-CM | POA: Diagnosis not present

## 2022-03-27 DIAGNOSIS — R69 Illness, unspecified: Secondary | ICD-10-CM | POA: Diagnosis not present

## 2022-03-27 DIAGNOSIS — K580 Irritable bowel syndrome with diarrhea: Secondary | ICD-10-CM | POA: Diagnosis not present

## 2022-03-27 DIAGNOSIS — D500 Iron deficiency anemia secondary to blood loss (chronic): Secondary | ICD-10-CM | POA: Diagnosis not present

## 2022-03-27 DIAGNOSIS — I700 Atherosclerosis of aorta: Secondary | ICD-10-CM | POA: Diagnosis not present

## 2022-03-27 DIAGNOSIS — Z7982 Long term (current) use of aspirin: Secondary | ICD-10-CM | POA: Diagnosis not present

## 2022-03-27 DIAGNOSIS — M810 Age-related osteoporosis without current pathological fracture: Secondary | ICD-10-CM | POA: Diagnosis not present

## 2022-03-27 DIAGNOSIS — L97322 Non-pressure chronic ulcer of left ankle with fat layer exposed: Secondary | ICD-10-CM | POA: Diagnosis not present

## 2022-03-27 DIAGNOSIS — I872 Venous insufficiency (chronic) (peripheral): Secondary | ICD-10-CM | POA: Diagnosis not present

## 2022-03-27 DIAGNOSIS — Z9181 History of falling: Secondary | ICD-10-CM | POA: Diagnosis not present

## 2022-03-30 DIAGNOSIS — Z7982 Long term (current) use of aspirin: Secondary | ICD-10-CM | POA: Diagnosis not present

## 2022-03-30 DIAGNOSIS — M810 Age-related osteoporosis without current pathological fracture: Secondary | ICD-10-CM | POA: Diagnosis not present

## 2022-03-30 DIAGNOSIS — J449 Chronic obstructive pulmonary disease, unspecified: Secondary | ICD-10-CM | POA: Diagnosis not present

## 2022-03-30 DIAGNOSIS — L97322 Non-pressure chronic ulcer of left ankle with fat layer exposed: Secondary | ICD-10-CM | POA: Diagnosis not present

## 2022-03-30 DIAGNOSIS — I10 Essential (primary) hypertension: Secondary | ICD-10-CM | POA: Diagnosis not present

## 2022-03-30 DIAGNOSIS — I700 Atherosclerosis of aorta: Secondary | ICD-10-CM | POA: Diagnosis not present

## 2022-03-30 DIAGNOSIS — R69 Illness, unspecified: Secondary | ICD-10-CM | POA: Diagnosis not present

## 2022-03-30 DIAGNOSIS — I872 Venous insufficiency (chronic) (peripheral): Secondary | ICD-10-CM | POA: Diagnosis not present

## 2022-03-30 DIAGNOSIS — I890 Lymphedema, not elsewhere classified: Secondary | ICD-10-CM | POA: Diagnosis not present

## 2022-03-30 DIAGNOSIS — Z9181 History of falling: Secondary | ICD-10-CM | POA: Diagnosis not present

## 2022-03-30 DIAGNOSIS — K580 Irritable bowel syndrome with diarrhea: Secondary | ICD-10-CM | POA: Diagnosis not present

## 2022-03-30 DIAGNOSIS — D500 Iron deficiency anemia secondary to blood loss (chronic): Secondary | ICD-10-CM | POA: Diagnosis not present

## 2022-03-30 DIAGNOSIS — Z8616 Personal history of COVID-19: Secondary | ICD-10-CM | POA: Diagnosis not present

## 2022-04-01 DIAGNOSIS — D500 Iron deficiency anemia secondary to blood loss (chronic): Secondary | ICD-10-CM | POA: Diagnosis not present

## 2022-04-01 DIAGNOSIS — M810 Age-related osteoporosis without current pathological fracture: Secondary | ICD-10-CM | POA: Diagnosis not present

## 2022-04-01 DIAGNOSIS — L97322 Non-pressure chronic ulcer of left ankle with fat layer exposed: Secondary | ICD-10-CM | POA: Diagnosis not present

## 2022-04-01 DIAGNOSIS — I700 Atherosclerosis of aorta: Secondary | ICD-10-CM | POA: Diagnosis not present

## 2022-04-01 DIAGNOSIS — K580 Irritable bowel syndrome with diarrhea: Secondary | ICD-10-CM | POA: Diagnosis not present

## 2022-04-01 DIAGNOSIS — I872 Venous insufficiency (chronic) (peripheral): Secondary | ICD-10-CM | POA: Diagnosis not present

## 2022-04-01 DIAGNOSIS — Z7982 Long term (current) use of aspirin: Secondary | ICD-10-CM | POA: Diagnosis not present

## 2022-04-01 DIAGNOSIS — I10 Essential (primary) hypertension: Secondary | ICD-10-CM | POA: Diagnosis not present

## 2022-04-01 DIAGNOSIS — R69 Illness, unspecified: Secondary | ICD-10-CM | POA: Diagnosis not present

## 2022-04-01 DIAGNOSIS — I890 Lymphedema, not elsewhere classified: Secondary | ICD-10-CM | POA: Diagnosis not present

## 2022-04-01 DIAGNOSIS — Z8616 Personal history of COVID-19: Secondary | ICD-10-CM | POA: Diagnosis not present

## 2022-04-01 DIAGNOSIS — J449 Chronic obstructive pulmonary disease, unspecified: Secondary | ICD-10-CM | POA: Diagnosis not present

## 2022-04-01 DIAGNOSIS — Z9181 History of falling: Secondary | ICD-10-CM | POA: Diagnosis not present

## 2022-04-03 DIAGNOSIS — I700 Atherosclerosis of aorta: Secondary | ICD-10-CM | POA: Diagnosis not present

## 2022-04-03 DIAGNOSIS — Z9181 History of falling: Secondary | ICD-10-CM | POA: Diagnosis not present

## 2022-04-03 DIAGNOSIS — L97322 Non-pressure chronic ulcer of left ankle with fat layer exposed: Secondary | ICD-10-CM | POA: Diagnosis not present

## 2022-04-03 DIAGNOSIS — K580 Irritable bowel syndrome with diarrhea: Secondary | ICD-10-CM | POA: Diagnosis not present

## 2022-04-03 DIAGNOSIS — J449 Chronic obstructive pulmonary disease, unspecified: Secondary | ICD-10-CM | POA: Diagnosis not present

## 2022-04-03 DIAGNOSIS — I10 Essential (primary) hypertension: Secondary | ICD-10-CM | POA: Diagnosis not present

## 2022-04-03 DIAGNOSIS — Z8616 Personal history of COVID-19: Secondary | ICD-10-CM | POA: Diagnosis not present

## 2022-04-03 DIAGNOSIS — I890 Lymphedema, not elsewhere classified: Secondary | ICD-10-CM | POA: Diagnosis not present

## 2022-04-03 DIAGNOSIS — D500 Iron deficiency anemia secondary to blood loss (chronic): Secondary | ICD-10-CM | POA: Diagnosis not present

## 2022-04-03 DIAGNOSIS — M810 Age-related osteoporosis without current pathological fracture: Secondary | ICD-10-CM | POA: Diagnosis not present

## 2022-04-03 DIAGNOSIS — I872 Venous insufficiency (chronic) (peripheral): Secondary | ICD-10-CM | POA: Diagnosis not present

## 2022-04-03 DIAGNOSIS — R69 Illness, unspecified: Secondary | ICD-10-CM | POA: Diagnosis not present

## 2022-04-03 DIAGNOSIS — Z7982 Long term (current) use of aspirin: Secondary | ICD-10-CM | POA: Diagnosis not present

## 2022-04-06 DIAGNOSIS — M810 Age-related osteoporosis without current pathological fracture: Secondary | ICD-10-CM | POA: Diagnosis not present

## 2022-04-06 DIAGNOSIS — Z7982 Long term (current) use of aspirin: Secondary | ICD-10-CM | POA: Diagnosis not present

## 2022-04-06 DIAGNOSIS — I890 Lymphedema, not elsewhere classified: Secondary | ICD-10-CM | POA: Diagnosis not present

## 2022-04-06 DIAGNOSIS — Z9181 History of falling: Secondary | ICD-10-CM | POA: Diagnosis not present

## 2022-04-06 DIAGNOSIS — I10 Essential (primary) hypertension: Secondary | ICD-10-CM | POA: Diagnosis not present

## 2022-04-06 DIAGNOSIS — D500 Iron deficiency anemia secondary to blood loss (chronic): Secondary | ICD-10-CM | POA: Diagnosis not present

## 2022-04-06 DIAGNOSIS — J449 Chronic obstructive pulmonary disease, unspecified: Secondary | ICD-10-CM | POA: Diagnosis not present

## 2022-04-06 DIAGNOSIS — L97322 Non-pressure chronic ulcer of left ankle with fat layer exposed: Secondary | ICD-10-CM | POA: Diagnosis not present

## 2022-04-06 DIAGNOSIS — R69 Illness, unspecified: Secondary | ICD-10-CM | POA: Diagnosis not present

## 2022-04-06 DIAGNOSIS — Z8616 Personal history of COVID-19: Secondary | ICD-10-CM | POA: Diagnosis not present

## 2022-04-06 DIAGNOSIS — K580 Irritable bowel syndrome with diarrhea: Secondary | ICD-10-CM | POA: Diagnosis not present

## 2022-04-06 DIAGNOSIS — I872 Venous insufficiency (chronic) (peripheral): Secondary | ICD-10-CM | POA: Diagnosis not present

## 2022-04-06 DIAGNOSIS — I700 Atherosclerosis of aorta: Secondary | ICD-10-CM | POA: Diagnosis not present

## 2022-04-08 DIAGNOSIS — I890 Lymphedema, not elsewhere classified: Secondary | ICD-10-CM | POA: Diagnosis not present

## 2022-04-08 DIAGNOSIS — D500 Iron deficiency anemia secondary to blood loss (chronic): Secondary | ICD-10-CM | POA: Diagnosis not present

## 2022-04-08 DIAGNOSIS — Z7982 Long term (current) use of aspirin: Secondary | ICD-10-CM | POA: Diagnosis not present

## 2022-04-08 DIAGNOSIS — R69 Illness, unspecified: Secondary | ICD-10-CM | POA: Diagnosis not present

## 2022-04-08 DIAGNOSIS — Z8616 Personal history of COVID-19: Secondary | ICD-10-CM | POA: Diagnosis not present

## 2022-04-08 DIAGNOSIS — M810 Age-related osteoporosis without current pathological fracture: Secondary | ICD-10-CM | POA: Diagnosis not present

## 2022-04-08 DIAGNOSIS — K580 Irritable bowel syndrome with diarrhea: Secondary | ICD-10-CM | POA: Diagnosis not present

## 2022-04-08 DIAGNOSIS — J449 Chronic obstructive pulmonary disease, unspecified: Secondary | ICD-10-CM | POA: Diagnosis not present

## 2022-04-08 DIAGNOSIS — Z9181 History of falling: Secondary | ICD-10-CM | POA: Diagnosis not present

## 2022-04-08 DIAGNOSIS — I872 Venous insufficiency (chronic) (peripheral): Secondary | ICD-10-CM | POA: Diagnosis not present

## 2022-04-08 DIAGNOSIS — L97322 Non-pressure chronic ulcer of left ankle with fat layer exposed: Secondary | ICD-10-CM | POA: Diagnosis not present

## 2022-04-08 DIAGNOSIS — I700 Atherosclerosis of aorta: Secondary | ICD-10-CM | POA: Diagnosis not present

## 2022-04-08 DIAGNOSIS — I10 Essential (primary) hypertension: Secondary | ICD-10-CM | POA: Diagnosis not present

## 2022-04-10 DIAGNOSIS — K580 Irritable bowel syndrome with diarrhea: Secondary | ICD-10-CM | POA: Diagnosis not present

## 2022-04-10 DIAGNOSIS — R69 Illness, unspecified: Secondary | ICD-10-CM | POA: Diagnosis not present

## 2022-04-10 DIAGNOSIS — L97322 Non-pressure chronic ulcer of left ankle with fat layer exposed: Secondary | ICD-10-CM | POA: Diagnosis not present

## 2022-04-10 DIAGNOSIS — I10 Essential (primary) hypertension: Secondary | ICD-10-CM | POA: Diagnosis not present

## 2022-04-10 DIAGNOSIS — D500 Iron deficiency anemia secondary to blood loss (chronic): Secondary | ICD-10-CM | POA: Diagnosis not present

## 2022-04-10 DIAGNOSIS — I700 Atherosclerosis of aorta: Secondary | ICD-10-CM | POA: Diagnosis not present

## 2022-04-10 DIAGNOSIS — Z8616 Personal history of COVID-19: Secondary | ICD-10-CM | POA: Diagnosis not present

## 2022-04-10 DIAGNOSIS — Z7982 Long term (current) use of aspirin: Secondary | ICD-10-CM | POA: Diagnosis not present

## 2022-04-10 DIAGNOSIS — J449 Chronic obstructive pulmonary disease, unspecified: Secondary | ICD-10-CM | POA: Diagnosis not present

## 2022-04-10 DIAGNOSIS — I872 Venous insufficiency (chronic) (peripheral): Secondary | ICD-10-CM | POA: Diagnosis not present

## 2022-04-10 DIAGNOSIS — Z9181 History of falling: Secondary | ICD-10-CM | POA: Diagnosis not present

## 2022-04-10 DIAGNOSIS — I890 Lymphedema, not elsewhere classified: Secondary | ICD-10-CM | POA: Diagnosis not present

## 2022-04-10 DIAGNOSIS — M810 Age-related osteoporosis without current pathological fracture: Secondary | ICD-10-CM | POA: Diagnosis not present

## 2022-04-14 DIAGNOSIS — M810 Age-related osteoporosis without current pathological fracture: Secondary | ICD-10-CM | POA: Diagnosis not present

## 2022-04-14 DIAGNOSIS — J449 Chronic obstructive pulmonary disease, unspecified: Secondary | ICD-10-CM | POA: Diagnosis not present

## 2022-04-14 DIAGNOSIS — L97322 Non-pressure chronic ulcer of left ankle with fat layer exposed: Secondary | ICD-10-CM | POA: Diagnosis not present

## 2022-04-14 DIAGNOSIS — R69 Illness, unspecified: Secondary | ICD-10-CM | POA: Diagnosis not present

## 2022-04-14 DIAGNOSIS — I890 Lymphedema, not elsewhere classified: Secondary | ICD-10-CM | POA: Diagnosis not present

## 2022-04-14 DIAGNOSIS — Z8616 Personal history of COVID-19: Secondary | ICD-10-CM | POA: Diagnosis not present

## 2022-04-14 DIAGNOSIS — D500 Iron deficiency anemia secondary to blood loss (chronic): Secondary | ICD-10-CM | POA: Diagnosis not present

## 2022-04-14 DIAGNOSIS — Z7982 Long term (current) use of aspirin: Secondary | ICD-10-CM | POA: Diagnosis not present

## 2022-04-14 DIAGNOSIS — I872 Venous insufficiency (chronic) (peripheral): Secondary | ICD-10-CM | POA: Diagnosis not present

## 2022-04-14 DIAGNOSIS — K580 Irritable bowel syndrome with diarrhea: Secondary | ICD-10-CM | POA: Diagnosis not present

## 2022-04-14 DIAGNOSIS — I700 Atherosclerosis of aorta: Secondary | ICD-10-CM | POA: Diagnosis not present

## 2022-04-14 DIAGNOSIS — Z9181 History of falling: Secondary | ICD-10-CM | POA: Diagnosis not present

## 2022-04-14 DIAGNOSIS — I10 Essential (primary) hypertension: Secondary | ICD-10-CM | POA: Diagnosis not present

## 2022-04-17 DIAGNOSIS — I890 Lymphedema, not elsewhere classified: Secondary | ICD-10-CM | POA: Diagnosis not present

## 2022-04-17 DIAGNOSIS — M810 Age-related osteoporosis without current pathological fracture: Secondary | ICD-10-CM | POA: Diagnosis not present

## 2022-04-17 DIAGNOSIS — Z7982 Long term (current) use of aspirin: Secondary | ICD-10-CM | POA: Diagnosis not present

## 2022-04-17 DIAGNOSIS — K580 Irritable bowel syndrome with diarrhea: Secondary | ICD-10-CM | POA: Diagnosis not present

## 2022-04-17 DIAGNOSIS — R69 Illness, unspecified: Secondary | ICD-10-CM | POA: Diagnosis not present

## 2022-04-17 DIAGNOSIS — Z9181 History of falling: Secondary | ICD-10-CM | POA: Diagnosis not present

## 2022-04-17 DIAGNOSIS — D500 Iron deficiency anemia secondary to blood loss (chronic): Secondary | ICD-10-CM | POA: Diagnosis not present

## 2022-04-17 DIAGNOSIS — Z8616 Personal history of COVID-19: Secondary | ICD-10-CM | POA: Diagnosis not present

## 2022-04-17 DIAGNOSIS — I10 Essential (primary) hypertension: Secondary | ICD-10-CM | POA: Diagnosis not present

## 2022-04-17 DIAGNOSIS — I700 Atherosclerosis of aorta: Secondary | ICD-10-CM | POA: Diagnosis not present

## 2022-04-17 DIAGNOSIS — J449 Chronic obstructive pulmonary disease, unspecified: Secondary | ICD-10-CM | POA: Diagnosis not present

## 2022-04-17 DIAGNOSIS — L97322 Non-pressure chronic ulcer of left ankle with fat layer exposed: Secondary | ICD-10-CM | POA: Diagnosis not present

## 2022-04-17 DIAGNOSIS — I872 Venous insufficiency (chronic) (peripheral): Secondary | ICD-10-CM | POA: Diagnosis not present

## 2022-04-21 DIAGNOSIS — M810 Age-related osteoporosis without current pathological fracture: Secondary | ICD-10-CM | POA: Diagnosis not present

## 2022-04-21 DIAGNOSIS — I872 Venous insufficiency (chronic) (peripheral): Secondary | ICD-10-CM | POA: Diagnosis not present

## 2022-04-21 DIAGNOSIS — I700 Atherosclerosis of aorta: Secondary | ICD-10-CM | POA: Diagnosis not present

## 2022-04-21 DIAGNOSIS — I890 Lymphedema, not elsewhere classified: Secondary | ICD-10-CM | POA: Diagnosis not present

## 2022-04-21 DIAGNOSIS — Z7982 Long term (current) use of aspirin: Secondary | ICD-10-CM | POA: Diagnosis not present

## 2022-04-21 DIAGNOSIS — Z9181 History of falling: Secondary | ICD-10-CM | POA: Diagnosis not present

## 2022-04-21 DIAGNOSIS — Z8616 Personal history of COVID-19: Secondary | ICD-10-CM | POA: Diagnosis not present

## 2022-04-21 DIAGNOSIS — K580 Irritable bowel syndrome with diarrhea: Secondary | ICD-10-CM | POA: Diagnosis not present

## 2022-04-21 DIAGNOSIS — I10 Essential (primary) hypertension: Secondary | ICD-10-CM | POA: Diagnosis not present

## 2022-04-21 DIAGNOSIS — D500 Iron deficiency anemia secondary to blood loss (chronic): Secondary | ICD-10-CM | POA: Diagnosis not present

## 2022-04-21 DIAGNOSIS — R69 Illness, unspecified: Secondary | ICD-10-CM | POA: Diagnosis not present

## 2022-04-21 DIAGNOSIS — J449 Chronic obstructive pulmonary disease, unspecified: Secondary | ICD-10-CM | POA: Diagnosis not present

## 2022-04-21 DIAGNOSIS — L97322 Non-pressure chronic ulcer of left ankle with fat layer exposed: Secondary | ICD-10-CM | POA: Diagnosis not present

## 2022-04-22 DIAGNOSIS — Z7982 Long term (current) use of aspirin: Secondary | ICD-10-CM | POA: Diagnosis not present

## 2022-04-22 DIAGNOSIS — I872 Venous insufficiency (chronic) (peripheral): Secondary | ICD-10-CM | POA: Diagnosis not present

## 2022-04-22 DIAGNOSIS — Z79899 Other long term (current) drug therapy: Secondary | ICD-10-CM | POA: Diagnosis not present

## 2022-04-22 DIAGNOSIS — I890 Lymphedema, not elsewhere classified: Secondary | ICD-10-CM | POA: Diagnosis not present

## 2022-04-22 DIAGNOSIS — K4090 Unilateral inguinal hernia, without obstruction or gangrene, not specified as recurrent: Secondary | ICD-10-CM | POA: Diagnosis not present

## 2022-04-22 DIAGNOSIS — I10 Essential (primary) hypertension: Secondary | ICD-10-CM | POA: Diagnosis not present

## 2022-04-22 DIAGNOSIS — L97321 Non-pressure chronic ulcer of left ankle limited to breakdown of skin: Secondary | ICD-10-CM | POA: Diagnosis not present

## 2022-04-22 DIAGNOSIS — J449 Chronic obstructive pulmonary disease, unspecified: Secondary | ICD-10-CM | POA: Diagnosis not present

## 2022-04-22 DIAGNOSIS — X58XXXA Exposure to other specified factors, initial encounter: Secondary | ICD-10-CM | POA: Diagnosis not present

## 2022-04-22 DIAGNOSIS — Z87891 Personal history of nicotine dependence: Secondary | ICD-10-CM | POA: Diagnosis not present

## 2022-04-22 DIAGNOSIS — S91101A Unspecified open wound of right great toe without damage to nail, initial encounter: Secondary | ICD-10-CM | POA: Diagnosis not present

## 2022-04-22 DIAGNOSIS — L97329 Non-pressure chronic ulcer of left ankle with unspecified severity: Secondary | ICD-10-CM | POA: Diagnosis not present

## 2022-04-22 DIAGNOSIS — Z7983 Long term (current) use of bisphosphonates: Secondary | ICD-10-CM | POA: Diagnosis not present

## 2022-04-27 DIAGNOSIS — L280 Lichen simplex chronicus: Secondary | ICD-10-CM | POA: Diagnosis not present

## 2022-04-27 DIAGNOSIS — L309 Dermatitis, unspecified: Secondary | ICD-10-CM | POA: Diagnosis not present

## 2022-04-28 DIAGNOSIS — E440 Moderate protein-calorie malnutrition: Secondary | ICD-10-CM | POA: Diagnosis not present

## 2022-04-28 DIAGNOSIS — Z23 Encounter for immunization: Secondary | ICD-10-CM | POA: Diagnosis not present

## 2022-04-28 DIAGNOSIS — Z9189 Other specified personal risk factors, not elsewhere classified: Secondary | ICD-10-CM | POA: Diagnosis not present

## 2022-04-28 DIAGNOSIS — F331 Major depressive disorder, recurrent, moderate: Secondary | ICD-10-CM | POA: Diagnosis not present

## 2022-04-28 DIAGNOSIS — E782 Mixed hyperlipidemia: Secondary | ICD-10-CM | POA: Diagnosis not present

## 2022-04-28 DIAGNOSIS — I10 Essential (primary) hypertension: Secondary | ICD-10-CM | POA: Diagnosis not present

## 2022-04-28 DIAGNOSIS — D500 Iron deficiency anemia secondary to blood loss (chronic): Secondary | ICD-10-CM | POA: Diagnosis not present

## 2022-04-28 DIAGNOSIS — Z1212 Encounter for screening for malignant neoplasm of rectum: Secondary | ICD-10-CM | POA: Diagnosis not present

## 2022-04-28 DIAGNOSIS — Z7982 Long term (current) use of aspirin: Secondary | ICD-10-CM | POA: Diagnosis not present

## 2022-04-28 DIAGNOSIS — I700 Atherosclerosis of aorta: Secondary | ICD-10-CM | POA: Diagnosis not present

## 2022-04-28 DIAGNOSIS — K580 Irritable bowel syndrome with diarrhea: Secondary | ICD-10-CM | POA: Diagnosis not present

## 2022-04-28 DIAGNOSIS — K7581 Nonalcoholic steatohepatitis (NASH): Secondary | ICD-10-CM | POA: Diagnosis not present

## 2022-04-28 DIAGNOSIS — R7301 Impaired fasting glucose: Secondary | ICD-10-CM | POA: Diagnosis not present

## 2022-04-28 DIAGNOSIS — E7849 Other hyperlipidemia: Secondary | ICD-10-CM | POA: Diagnosis not present

## 2022-04-28 DIAGNOSIS — I872 Venous insufficiency (chronic) (peripheral): Secondary | ICD-10-CM | POA: Diagnosis not present

## 2022-04-28 DIAGNOSIS — D692 Other nonthrombocytopenic purpura: Secondary | ICD-10-CM | POA: Diagnosis not present

## 2022-04-28 DIAGNOSIS — L97322 Non-pressure chronic ulcer of left ankle with fat layer exposed: Secondary | ICD-10-CM | POA: Diagnosis not present

## 2022-04-28 DIAGNOSIS — I890 Lymphedema, not elsewhere classified: Secondary | ICD-10-CM | POA: Diagnosis not present

## 2022-04-28 DIAGNOSIS — L280 Lichen simplex chronicus: Secondary | ICD-10-CM | POA: Diagnosis not present

## 2022-04-28 DIAGNOSIS — M810 Age-related osteoporosis without current pathological fracture: Secondary | ICD-10-CM | POA: Diagnosis not present

## 2022-04-28 DIAGNOSIS — R4582 Worries: Secondary | ICD-10-CM | POA: Diagnosis not present

## 2022-04-28 DIAGNOSIS — Z8616 Personal history of COVID-19: Secondary | ICD-10-CM | POA: Diagnosis not present

## 2022-04-28 DIAGNOSIS — I739 Peripheral vascular disease, unspecified: Secondary | ICD-10-CM | POA: Diagnosis not present

## 2022-04-28 DIAGNOSIS — R69 Illness, unspecified: Secondary | ICD-10-CM | POA: Diagnosis not present

## 2022-04-28 DIAGNOSIS — Z9181 History of falling: Secondary | ICD-10-CM | POA: Diagnosis not present

## 2022-04-28 DIAGNOSIS — J449 Chronic obstructive pulmonary disease, unspecified: Secondary | ICD-10-CM | POA: Diagnosis not present

## 2022-05-01 DIAGNOSIS — D500 Iron deficiency anemia secondary to blood loss (chronic): Secondary | ICD-10-CM | POA: Diagnosis not present

## 2022-05-01 DIAGNOSIS — R69 Illness, unspecified: Secondary | ICD-10-CM | POA: Diagnosis not present

## 2022-05-01 DIAGNOSIS — Z7982 Long term (current) use of aspirin: Secondary | ICD-10-CM | POA: Diagnosis not present

## 2022-05-01 DIAGNOSIS — Z9181 History of falling: Secondary | ICD-10-CM | POA: Diagnosis not present

## 2022-05-01 DIAGNOSIS — I700 Atherosclerosis of aorta: Secondary | ICD-10-CM | POA: Diagnosis not present

## 2022-05-01 DIAGNOSIS — I872 Venous insufficiency (chronic) (peripheral): Secondary | ICD-10-CM | POA: Diagnosis not present

## 2022-05-01 DIAGNOSIS — Z8616 Personal history of COVID-19: Secondary | ICD-10-CM | POA: Diagnosis not present

## 2022-05-01 DIAGNOSIS — L97322 Non-pressure chronic ulcer of left ankle with fat layer exposed: Secondary | ICD-10-CM | POA: Diagnosis not present

## 2022-05-01 DIAGNOSIS — I10 Essential (primary) hypertension: Secondary | ICD-10-CM | POA: Diagnosis not present

## 2022-05-01 DIAGNOSIS — I890 Lymphedema, not elsewhere classified: Secondary | ICD-10-CM | POA: Diagnosis not present

## 2022-05-01 DIAGNOSIS — J449 Chronic obstructive pulmonary disease, unspecified: Secondary | ICD-10-CM | POA: Diagnosis not present

## 2022-05-01 DIAGNOSIS — K580 Irritable bowel syndrome with diarrhea: Secondary | ICD-10-CM | POA: Diagnosis not present

## 2022-05-01 DIAGNOSIS — M810 Age-related osteoporosis without current pathological fracture: Secondary | ICD-10-CM | POA: Diagnosis not present

## 2022-05-04 DIAGNOSIS — R69 Illness, unspecified: Secondary | ICD-10-CM | POA: Diagnosis not present

## 2022-05-04 DIAGNOSIS — L97322 Non-pressure chronic ulcer of left ankle with fat layer exposed: Secondary | ICD-10-CM | POA: Diagnosis not present

## 2022-05-04 DIAGNOSIS — I10 Essential (primary) hypertension: Secondary | ICD-10-CM | POA: Diagnosis not present

## 2022-05-04 DIAGNOSIS — K580 Irritable bowel syndrome with diarrhea: Secondary | ICD-10-CM | POA: Diagnosis not present

## 2022-05-04 DIAGNOSIS — Z8616 Personal history of COVID-19: Secondary | ICD-10-CM | POA: Diagnosis not present

## 2022-05-04 DIAGNOSIS — I700 Atherosclerosis of aorta: Secondary | ICD-10-CM | POA: Diagnosis not present

## 2022-05-04 DIAGNOSIS — D500 Iron deficiency anemia secondary to blood loss (chronic): Secondary | ICD-10-CM | POA: Diagnosis not present

## 2022-05-04 DIAGNOSIS — Z9181 History of falling: Secondary | ICD-10-CM | POA: Diagnosis not present

## 2022-05-04 DIAGNOSIS — J449 Chronic obstructive pulmonary disease, unspecified: Secondary | ICD-10-CM | POA: Diagnosis not present

## 2022-05-04 DIAGNOSIS — I890 Lymphedema, not elsewhere classified: Secondary | ICD-10-CM | POA: Diagnosis not present

## 2022-05-04 DIAGNOSIS — M810 Age-related osteoporosis without current pathological fracture: Secondary | ICD-10-CM | POA: Diagnosis not present

## 2022-05-04 DIAGNOSIS — Z7982 Long term (current) use of aspirin: Secondary | ICD-10-CM | POA: Diagnosis not present

## 2022-05-04 DIAGNOSIS — I872 Venous insufficiency (chronic) (peripheral): Secondary | ICD-10-CM | POA: Diagnosis not present

## 2022-05-07 DIAGNOSIS — I10 Essential (primary) hypertension: Secondary | ICD-10-CM | POA: Diagnosis not present

## 2022-05-07 DIAGNOSIS — I700 Atherosclerosis of aorta: Secondary | ICD-10-CM | POA: Diagnosis not present

## 2022-05-07 DIAGNOSIS — Z8616 Personal history of COVID-19: Secondary | ICD-10-CM | POA: Diagnosis not present

## 2022-05-07 DIAGNOSIS — D500 Iron deficiency anemia secondary to blood loss (chronic): Secondary | ICD-10-CM | POA: Diagnosis not present

## 2022-05-07 DIAGNOSIS — I872 Venous insufficiency (chronic) (peripheral): Secondary | ICD-10-CM | POA: Diagnosis not present

## 2022-05-07 DIAGNOSIS — L97322 Non-pressure chronic ulcer of left ankle with fat layer exposed: Secondary | ICD-10-CM | POA: Diagnosis not present

## 2022-05-07 DIAGNOSIS — Z9181 History of falling: Secondary | ICD-10-CM | POA: Diagnosis not present

## 2022-05-07 DIAGNOSIS — R69 Illness, unspecified: Secondary | ICD-10-CM | POA: Diagnosis not present

## 2022-05-07 DIAGNOSIS — J449 Chronic obstructive pulmonary disease, unspecified: Secondary | ICD-10-CM | POA: Diagnosis not present

## 2022-05-07 DIAGNOSIS — Z7982 Long term (current) use of aspirin: Secondary | ICD-10-CM | POA: Diagnosis not present

## 2022-05-07 DIAGNOSIS — I890 Lymphedema, not elsewhere classified: Secondary | ICD-10-CM | POA: Diagnosis not present

## 2022-05-07 DIAGNOSIS — M810 Age-related osteoporosis without current pathological fracture: Secondary | ICD-10-CM | POA: Diagnosis not present

## 2022-05-07 DIAGNOSIS — K580 Irritable bowel syndrome with diarrhea: Secondary | ICD-10-CM | POA: Diagnosis not present

## 2022-05-12 DIAGNOSIS — I10 Essential (primary) hypertension: Secondary | ICD-10-CM | POA: Diagnosis not present

## 2022-05-12 DIAGNOSIS — Z9181 History of falling: Secondary | ICD-10-CM | POA: Diagnosis not present

## 2022-05-12 DIAGNOSIS — I700 Atherosclerosis of aorta: Secondary | ICD-10-CM | POA: Diagnosis not present

## 2022-05-12 DIAGNOSIS — K580 Irritable bowel syndrome with diarrhea: Secondary | ICD-10-CM | POA: Diagnosis not present

## 2022-05-12 DIAGNOSIS — Z7982 Long term (current) use of aspirin: Secondary | ICD-10-CM | POA: Diagnosis not present

## 2022-05-12 DIAGNOSIS — M810 Age-related osteoporosis without current pathological fracture: Secondary | ICD-10-CM | POA: Diagnosis not present

## 2022-05-12 DIAGNOSIS — J449 Chronic obstructive pulmonary disease, unspecified: Secondary | ICD-10-CM | POA: Diagnosis not present

## 2022-05-12 DIAGNOSIS — Z8616 Personal history of COVID-19: Secondary | ICD-10-CM | POA: Diagnosis not present

## 2022-05-12 DIAGNOSIS — I890 Lymphedema, not elsewhere classified: Secondary | ICD-10-CM | POA: Diagnosis not present

## 2022-05-12 DIAGNOSIS — I872 Venous insufficiency (chronic) (peripheral): Secondary | ICD-10-CM | POA: Diagnosis not present

## 2022-05-12 DIAGNOSIS — L97322 Non-pressure chronic ulcer of left ankle with fat layer exposed: Secondary | ICD-10-CM | POA: Diagnosis not present

## 2022-05-12 DIAGNOSIS — D500 Iron deficiency anemia secondary to blood loss (chronic): Secondary | ICD-10-CM | POA: Diagnosis not present

## 2022-05-12 DIAGNOSIS — R69 Illness, unspecified: Secondary | ICD-10-CM | POA: Diagnosis not present

## 2022-05-15 DIAGNOSIS — I700 Atherosclerosis of aorta: Secondary | ICD-10-CM | POA: Diagnosis not present

## 2022-05-15 DIAGNOSIS — Z9181 History of falling: Secondary | ICD-10-CM | POA: Diagnosis not present

## 2022-05-15 DIAGNOSIS — I872 Venous insufficiency (chronic) (peripheral): Secondary | ICD-10-CM | POA: Diagnosis not present

## 2022-05-15 DIAGNOSIS — R69 Illness, unspecified: Secondary | ICD-10-CM | POA: Diagnosis not present

## 2022-05-15 DIAGNOSIS — I10 Essential (primary) hypertension: Secondary | ICD-10-CM | POA: Diagnosis not present

## 2022-05-15 DIAGNOSIS — D500 Iron deficiency anemia secondary to blood loss (chronic): Secondary | ICD-10-CM | POA: Diagnosis not present

## 2022-05-15 DIAGNOSIS — Z7982 Long term (current) use of aspirin: Secondary | ICD-10-CM | POA: Diagnosis not present

## 2022-05-15 DIAGNOSIS — L97322 Non-pressure chronic ulcer of left ankle with fat layer exposed: Secondary | ICD-10-CM | POA: Diagnosis not present

## 2022-05-15 DIAGNOSIS — I890 Lymphedema, not elsewhere classified: Secondary | ICD-10-CM | POA: Diagnosis not present

## 2022-05-15 DIAGNOSIS — M810 Age-related osteoporosis without current pathological fracture: Secondary | ICD-10-CM | POA: Diagnosis not present

## 2022-05-15 DIAGNOSIS — Z8616 Personal history of COVID-19: Secondary | ICD-10-CM | POA: Diagnosis not present

## 2022-05-15 DIAGNOSIS — K580 Irritable bowel syndrome with diarrhea: Secondary | ICD-10-CM | POA: Diagnosis not present

## 2022-05-15 DIAGNOSIS — J449 Chronic obstructive pulmonary disease, unspecified: Secondary | ICD-10-CM | POA: Diagnosis not present

## 2022-05-20 DIAGNOSIS — I890 Lymphedema, not elsewhere classified: Secondary | ICD-10-CM | POA: Diagnosis not present

## 2022-05-20 DIAGNOSIS — L97329 Non-pressure chronic ulcer of left ankle with unspecified severity: Secondary | ICD-10-CM | POA: Diagnosis not present

## 2022-05-20 DIAGNOSIS — L97222 Non-pressure chronic ulcer of left calf with fat layer exposed: Secondary | ICD-10-CM | POA: Diagnosis not present

## 2022-05-20 DIAGNOSIS — K4090 Unilateral inguinal hernia, without obstruction or gangrene, not specified as recurrent: Secondary | ICD-10-CM | POA: Diagnosis not present

## 2022-05-20 DIAGNOSIS — Z7982 Long term (current) use of aspirin: Secondary | ICD-10-CM | POA: Diagnosis not present

## 2022-05-20 DIAGNOSIS — Z87891 Personal history of nicotine dependence: Secondary | ICD-10-CM | POA: Diagnosis not present

## 2022-05-20 DIAGNOSIS — J449 Chronic obstructive pulmonary disease, unspecified: Secondary | ICD-10-CM | POA: Diagnosis not present

## 2022-05-20 DIAGNOSIS — I872 Venous insufficiency (chronic) (peripheral): Secondary | ICD-10-CM | POA: Diagnosis not present

## 2022-05-20 DIAGNOSIS — Z79899 Other long term (current) drug therapy: Secondary | ICD-10-CM | POA: Diagnosis not present

## 2022-05-20 DIAGNOSIS — S91101A Unspecified open wound of right great toe without damage to nail, initial encounter: Secondary | ICD-10-CM | POA: Diagnosis not present

## 2022-05-20 DIAGNOSIS — I10 Essential (primary) hypertension: Secondary | ICD-10-CM | POA: Diagnosis not present

## 2022-05-20 DIAGNOSIS — X58XXXA Exposure to other specified factors, initial encounter: Secondary | ICD-10-CM | POA: Diagnosis not present

## 2022-05-20 DIAGNOSIS — Z7983 Long term (current) use of bisphosphonates: Secondary | ICD-10-CM | POA: Diagnosis not present

## 2022-05-20 DIAGNOSIS — S91104A Unspecified open wound of right lesser toe(s) without damage to nail, initial encounter: Secondary | ICD-10-CM | POA: Diagnosis not present

## 2022-05-21 DIAGNOSIS — D500 Iron deficiency anemia secondary to blood loss (chronic): Secondary | ICD-10-CM | POA: Diagnosis not present

## 2022-05-21 DIAGNOSIS — Z8616 Personal history of COVID-19: Secondary | ICD-10-CM | POA: Diagnosis not present

## 2022-05-21 DIAGNOSIS — Z9181 History of falling: Secondary | ICD-10-CM | POA: Diagnosis not present

## 2022-05-21 DIAGNOSIS — K580 Irritable bowel syndrome with diarrhea: Secondary | ICD-10-CM | POA: Diagnosis not present

## 2022-05-21 DIAGNOSIS — I700 Atherosclerosis of aorta: Secondary | ICD-10-CM | POA: Diagnosis not present

## 2022-05-21 DIAGNOSIS — I872 Venous insufficiency (chronic) (peripheral): Secondary | ICD-10-CM | POA: Diagnosis not present

## 2022-05-21 DIAGNOSIS — I10 Essential (primary) hypertension: Secondary | ICD-10-CM | POA: Diagnosis not present

## 2022-05-21 DIAGNOSIS — M810 Age-related osteoporosis without current pathological fracture: Secondary | ICD-10-CM | POA: Diagnosis not present

## 2022-05-21 DIAGNOSIS — Z7982 Long term (current) use of aspirin: Secondary | ICD-10-CM | POA: Diagnosis not present

## 2022-05-21 DIAGNOSIS — I890 Lymphedema, not elsewhere classified: Secondary | ICD-10-CM | POA: Diagnosis not present

## 2022-05-21 DIAGNOSIS — R69 Illness, unspecified: Secondary | ICD-10-CM | POA: Diagnosis not present

## 2022-05-21 DIAGNOSIS — J449 Chronic obstructive pulmonary disease, unspecified: Secondary | ICD-10-CM | POA: Diagnosis not present

## 2022-05-21 DIAGNOSIS — L97322 Non-pressure chronic ulcer of left ankle with fat layer exposed: Secondary | ICD-10-CM | POA: Diagnosis not present

## 2022-05-26 DIAGNOSIS — I10 Essential (primary) hypertension: Secondary | ICD-10-CM | POA: Diagnosis not present

## 2022-05-26 DIAGNOSIS — R69 Illness, unspecified: Secondary | ICD-10-CM | POA: Diagnosis not present

## 2022-05-26 DIAGNOSIS — M810 Age-related osteoporosis without current pathological fracture: Secondary | ICD-10-CM | POA: Diagnosis not present

## 2022-05-26 DIAGNOSIS — I872 Venous insufficiency (chronic) (peripheral): Secondary | ICD-10-CM | POA: Diagnosis not present

## 2022-05-26 DIAGNOSIS — I890 Lymphedema, not elsewhere classified: Secondary | ICD-10-CM | POA: Diagnosis not present

## 2022-05-26 DIAGNOSIS — K580 Irritable bowel syndrome with diarrhea: Secondary | ICD-10-CM | POA: Diagnosis not present

## 2022-05-26 DIAGNOSIS — I700 Atherosclerosis of aorta: Secondary | ICD-10-CM | POA: Diagnosis not present

## 2022-05-26 DIAGNOSIS — Z9181 History of falling: Secondary | ICD-10-CM | POA: Diagnosis not present

## 2022-05-26 DIAGNOSIS — D500 Iron deficiency anemia secondary to blood loss (chronic): Secondary | ICD-10-CM | POA: Diagnosis not present

## 2022-05-26 DIAGNOSIS — J449 Chronic obstructive pulmonary disease, unspecified: Secondary | ICD-10-CM | POA: Diagnosis not present

## 2022-05-26 DIAGNOSIS — Z7982 Long term (current) use of aspirin: Secondary | ICD-10-CM | POA: Diagnosis not present

## 2022-05-26 DIAGNOSIS — L97322 Non-pressure chronic ulcer of left ankle with fat layer exposed: Secondary | ICD-10-CM | POA: Diagnosis not present

## 2022-05-26 DIAGNOSIS — Z8616 Personal history of COVID-19: Secondary | ICD-10-CM | POA: Diagnosis not present

## 2022-05-29 DIAGNOSIS — K580 Irritable bowel syndrome with diarrhea: Secondary | ICD-10-CM | POA: Diagnosis not present

## 2022-05-29 DIAGNOSIS — M810 Age-related osteoporosis without current pathological fracture: Secondary | ICD-10-CM | POA: Diagnosis not present

## 2022-05-29 DIAGNOSIS — Z9181 History of falling: Secondary | ICD-10-CM | POA: Diagnosis not present

## 2022-05-29 DIAGNOSIS — L97322 Non-pressure chronic ulcer of left ankle with fat layer exposed: Secondary | ICD-10-CM | POA: Diagnosis not present

## 2022-05-29 DIAGNOSIS — Z7982 Long term (current) use of aspirin: Secondary | ICD-10-CM | POA: Diagnosis not present

## 2022-05-29 DIAGNOSIS — J449 Chronic obstructive pulmonary disease, unspecified: Secondary | ICD-10-CM | POA: Diagnosis not present

## 2022-05-29 DIAGNOSIS — Z8616 Personal history of COVID-19: Secondary | ICD-10-CM | POA: Diagnosis not present

## 2022-05-29 DIAGNOSIS — I10 Essential (primary) hypertension: Secondary | ICD-10-CM | POA: Diagnosis not present

## 2022-05-29 DIAGNOSIS — I872 Venous insufficiency (chronic) (peripheral): Secondary | ICD-10-CM | POA: Diagnosis not present

## 2022-05-29 DIAGNOSIS — D500 Iron deficiency anemia secondary to blood loss (chronic): Secondary | ICD-10-CM | POA: Diagnosis not present

## 2022-05-29 DIAGNOSIS — I700 Atherosclerosis of aorta: Secondary | ICD-10-CM | POA: Diagnosis not present

## 2022-05-29 DIAGNOSIS — R69 Illness, unspecified: Secondary | ICD-10-CM | POA: Diagnosis not present

## 2022-05-29 DIAGNOSIS — I890 Lymphedema, not elsewhere classified: Secondary | ICD-10-CM | POA: Diagnosis not present

## 2022-06-02 DIAGNOSIS — Z8616 Personal history of COVID-19: Secondary | ICD-10-CM | POA: Diagnosis not present

## 2022-06-02 DIAGNOSIS — Z9181 History of falling: Secondary | ICD-10-CM | POA: Diagnosis not present

## 2022-06-02 DIAGNOSIS — D500 Iron deficiency anemia secondary to blood loss (chronic): Secondary | ICD-10-CM | POA: Diagnosis not present

## 2022-06-02 DIAGNOSIS — J449 Chronic obstructive pulmonary disease, unspecified: Secondary | ICD-10-CM | POA: Diagnosis not present

## 2022-06-02 DIAGNOSIS — L97322 Non-pressure chronic ulcer of left ankle with fat layer exposed: Secondary | ICD-10-CM | POA: Diagnosis not present

## 2022-06-02 DIAGNOSIS — I700 Atherosclerosis of aorta: Secondary | ICD-10-CM | POA: Diagnosis not present

## 2022-06-02 DIAGNOSIS — R69 Illness, unspecified: Secondary | ICD-10-CM | POA: Diagnosis not present

## 2022-06-02 DIAGNOSIS — I10 Essential (primary) hypertension: Secondary | ICD-10-CM | POA: Diagnosis not present

## 2022-06-02 DIAGNOSIS — I890 Lymphedema, not elsewhere classified: Secondary | ICD-10-CM | POA: Diagnosis not present

## 2022-06-02 DIAGNOSIS — Z7982 Long term (current) use of aspirin: Secondary | ICD-10-CM | POA: Diagnosis not present

## 2022-06-02 DIAGNOSIS — M810 Age-related osteoporosis without current pathological fracture: Secondary | ICD-10-CM | POA: Diagnosis not present

## 2022-06-02 DIAGNOSIS — I872 Venous insufficiency (chronic) (peripheral): Secondary | ICD-10-CM | POA: Diagnosis not present

## 2022-06-02 DIAGNOSIS — K580 Irritable bowel syndrome with diarrhea: Secondary | ICD-10-CM | POA: Diagnosis not present

## 2022-06-04 DIAGNOSIS — J449 Chronic obstructive pulmonary disease, unspecified: Secondary | ICD-10-CM | POA: Diagnosis not present

## 2022-06-04 DIAGNOSIS — I872 Venous insufficiency (chronic) (peripheral): Secondary | ICD-10-CM | POA: Diagnosis not present

## 2022-06-04 DIAGNOSIS — I890 Lymphedema, not elsewhere classified: Secondary | ICD-10-CM | POA: Diagnosis not present

## 2022-06-04 DIAGNOSIS — I10 Essential (primary) hypertension: Secondary | ICD-10-CM | POA: Diagnosis not present

## 2022-06-04 DIAGNOSIS — D500 Iron deficiency anemia secondary to blood loss (chronic): Secondary | ICD-10-CM | POA: Diagnosis not present

## 2022-06-04 DIAGNOSIS — Z7982 Long term (current) use of aspirin: Secondary | ICD-10-CM | POA: Diagnosis not present

## 2022-06-04 DIAGNOSIS — L97322 Non-pressure chronic ulcer of left ankle with fat layer exposed: Secondary | ICD-10-CM | POA: Diagnosis not present

## 2022-06-04 DIAGNOSIS — Z9181 History of falling: Secondary | ICD-10-CM | POA: Diagnosis not present

## 2022-06-04 DIAGNOSIS — R69 Illness, unspecified: Secondary | ICD-10-CM | POA: Diagnosis not present

## 2022-06-04 DIAGNOSIS — I700 Atherosclerosis of aorta: Secondary | ICD-10-CM | POA: Diagnosis not present

## 2022-06-04 DIAGNOSIS — Z8616 Personal history of COVID-19: Secondary | ICD-10-CM | POA: Diagnosis not present

## 2022-06-04 DIAGNOSIS — M810 Age-related osteoporosis without current pathological fracture: Secondary | ICD-10-CM | POA: Diagnosis not present

## 2022-06-04 DIAGNOSIS — K580 Irritable bowel syndrome with diarrhea: Secondary | ICD-10-CM | POA: Diagnosis not present

## 2022-06-08 DIAGNOSIS — L97512 Non-pressure chronic ulcer of other part of right foot with fat layer exposed: Secondary | ICD-10-CM | POA: Diagnosis not present

## 2022-06-08 DIAGNOSIS — I739 Peripheral vascular disease, unspecified: Secondary | ICD-10-CM | POA: Diagnosis not present

## 2022-06-08 DIAGNOSIS — B351 Tinea unguium: Secondary | ICD-10-CM | POA: Diagnosis not present

## 2022-06-09 DIAGNOSIS — I890 Lymphedema, not elsewhere classified: Secondary | ICD-10-CM | POA: Diagnosis not present

## 2022-06-09 DIAGNOSIS — Z8616 Personal history of COVID-19: Secondary | ICD-10-CM | POA: Diagnosis not present

## 2022-06-09 DIAGNOSIS — D500 Iron deficiency anemia secondary to blood loss (chronic): Secondary | ICD-10-CM | POA: Diagnosis not present

## 2022-06-09 DIAGNOSIS — R69 Illness, unspecified: Secondary | ICD-10-CM | POA: Diagnosis not present

## 2022-06-09 DIAGNOSIS — Z7982 Long term (current) use of aspirin: Secondary | ICD-10-CM | POA: Diagnosis not present

## 2022-06-09 DIAGNOSIS — I10 Essential (primary) hypertension: Secondary | ICD-10-CM | POA: Diagnosis not present

## 2022-06-09 DIAGNOSIS — I872 Venous insufficiency (chronic) (peripheral): Secondary | ICD-10-CM | POA: Diagnosis not present

## 2022-06-09 DIAGNOSIS — J449 Chronic obstructive pulmonary disease, unspecified: Secondary | ICD-10-CM | POA: Diagnosis not present

## 2022-06-09 DIAGNOSIS — I700 Atherosclerosis of aorta: Secondary | ICD-10-CM | POA: Diagnosis not present

## 2022-06-09 DIAGNOSIS — M810 Age-related osteoporosis without current pathological fracture: Secondary | ICD-10-CM | POA: Diagnosis not present

## 2022-06-09 DIAGNOSIS — L97322 Non-pressure chronic ulcer of left ankle with fat layer exposed: Secondary | ICD-10-CM | POA: Diagnosis not present

## 2022-06-09 DIAGNOSIS — Z9181 History of falling: Secondary | ICD-10-CM | POA: Diagnosis not present

## 2022-06-09 DIAGNOSIS — K580 Irritable bowel syndrome with diarrhea: Secondary | ICD-10-CM | POA: Diagnosis not present

## 2022-06-12 DIAGNOSIS — K580 Irritable bowel syndrome with diarrhea: Secondary | ICD-10-CM | POA: Diagnosis not present

## 2022-06-12 DIAGNOSIS — I10 Essential (primary) hypertension: Secondary | ICD-10-CM | POA: Diagnosis not present

## 2022-06-12 DIAGNOSIS — Z8616 Personal history of COVID-19: Secondary | ICD-10-CM | POA: Diagnosis not present

## 2022-06-12 DIAGNOSIS — I890 Lymphedema, not elsewhere classified: Secondary | ICD-10-CM | POA: Diagnosis not present

## 2022-06-12 DIAGNOSIS — R69 Illness, unspecified: Secondary | ICD-10-CM | POA: Diagnosis not present

## 2022-06-12 DIAGNOSIS — D500 Iron deficiency anemia secondary to blood loss (chronic): Secondary | ICD-10-CM | POA: Diagnosis not present

## 2022-06-12 DIAGNOSIS — Z9181 History of falling: Secondary | ICD-10-CM | POA: Diagnosis not present

## 2022-06-12 DIAGNOSIS — I700 Atherosclerosis of aorta: Secondary | ICD-10-CM | POA: Diagnosis not present

## 2022-06-12 DIAGNOSIS — Z7982 Long term (current) use of aspirin: Secondary | ICD-10-CM | POA: Diagnosis not present

## 2022-06-12 DIAGNOSIS — L97322 Non-pressure chronic ulcer of left ankle with fat layer exposed: Secondary | ICD-10-CM | POA: Diagnosis not present

## 2022-06-12 DIAGNOSIS — M810 Age-related osteoporosis without current pathological fracture: Secondary | ICD-10-CM | POA: Diagnosis not present

## 2022-06-12 DIAGNOSIS — I872 Venous insufficiency (chronic) (peripheral): Secondary | ICD-10-CM | POA: Diagnosis not present

## 2022-06-12 DIAGNOSIS — J449 Chronic obstructive pulmonary disease, unspecified: Secondary | ICD-10-CM | POA: Diagnosis not present

## 2022-06-15 DIAGNOSIS — Z7982 Long term (current) use of aspirin: Secondary | ICD-10-CM | POA: Diagnosis not present

## 2022-06-15 DIAGNOSIS — L97322 Non-pressure chronic ulcer of left ankle with fat layer exposed: Secondary | ICD-10-CM | POA: Diagnosis not present

## 2022-06-15 DIAGNOSIS — I700 Atherosclerosis of aorta: Secondary | ICD-10-CM | POA: Diagnosis not present

## 2022-06-15 DIAGNOSIS — J449 Chronic obstructive pulmonary disease, unspecified: Secondary | ICD-10-CM | POA: Diagnosis not present

## 2022-06-15 DIAGNOSIS — I10 Essential (primary) hypertension: Secondary | ICD-10-CM | POA: Diagnosis not present

## 2022-06-15 DIAGNOSIS — K580 Irritable bowel syndrome with diarrhea: Secondary | ICD-10-CM | POA: Diagnosis not present

## 2022-06-15 DIAGNOSIS — I890 Lymphedema, not elsewhere classified: Secondary | ICD-10-CM | POA: Diagnosis not present

## 2022-06-15 DIAGNOSIS — Z8616 Personal history of COVID-19: Secondary | ICD-10-CM | POA: Diagnosis not present

## 2022-06-15 DIAGNOSIS — R69 Illness, unspecified: Secondary | ICD-10-CM | POA: Diagnosis not present

## 2022-06-15 DIAGNOSIS — Z9181 History of falling: Secondary | ICD-10-CM | POA: Diagnosis not present

## 2022-06-15 DIAGNOSIS — M810 Age-related osteoporosis without current pathological fracture: Secondary | ICD-10-CM | POA: Diagnosis not present

## 2022-06-15 DIAGNOSIS — D500 Iron deficiency anemia secondary to blood loss (chronic): Secondary | ICD-10-CM | POA: Diagnosis not present

## 2022-06-15 DIAGNOSIS — I872 Venous insufficiency (chronic) (peripheral): Secondary | ICD-10-CM | POA: Diagnosis not present

## 2022-06-18 DIAGNOSIS — I10 Essential (primary) hypertension: Secondary | ICD-10-CM | POA: Diagnosis not present

## 2022-06-18 DIAGNOSIS — I872 Venous insufficiency (chronic) (peripheral): Secondary | ICD-10-CM | POA: Diagnosis not present

## 2022-06-18 DIAGNOSIS — K580 Irritable bowel syndrome with diarrhea: Secondary | ICD-10-CM | POA: Diagnosis not present

## 2022-06-18 DIAGNOSIS — I700 Atherosclerosis of aorta: Secondary | ICD-10-CM | POA: Diagnosis not present

## 2022-06-18 DIAGNOSIS — D500 Iron deficiency anemia secondary to blood loss (chronic): Secondary | ICD-10-CM | POA: Diagnosis not present

## 2022-06-18 DIAGNOSIS — I890 Lymphedema, not elsewhere classified: Secondary | ICD-10-CM | POA: Diagnosis not present

## 2022-06-18 DIAGNOSIS — J449 Chronic obstructive pulmonary disease, unspecified: Secondary | ICD-10-CM | POA: Diagnosis not present

## 2022-06-18 DIAGNOSIS — M810 Age-related osteoporosis without current pathological fracture: Secondary | ICD-10-CM | POA: Diagnosis not present

## 2022-06-18 DIAGNOSIS — L97322 Non-pressure chronic ulcer of left ankle with fat layer exposed: Secondary | ICD-10-CM | POA: Diagnosis not present

## 2022-06-18 DIAGNOSIS — R69 Illness, unspecified: Secondary | ICD-10-CM | POA: Diagnosis not present

## 2022-06-18 DIAGNOSIS — Z9181 History of falling: Secondary | ICD-10-CM | POA: Diagnosis not present

## 2022-06-18 DIAGNOSIS — Z7982 Long term (current) use of aspirin: Secondary | ICD-10-CM | POA: Diagnosis not present

## 2022-06-18 DIAGNOSIS — Z8616 Personal history of COVID-19: Secondary | ICD-10-CM | POA: Diagnosis not present

## 2022-06-23 DIAGNOSIS — Z9181 History of falling: Secondary | ICD-10-CM | POA: Diagnosis not present

## 2022-06-23 DIAGNOSIS — D500 Iron deficiency anemia secondary to blood loss (chronic): Secondary | ICD-10-CM | POA: Diagnosis not present

## 2022-06-23 DIAGNOSIS — K580 Irritable bowel syndrome with diarrhea: Secondary | ICD-10-CM | POA: Diagnosis not present

## 2022-06-23 DIAGNOSIS — I700 Atherosclerosis of aorta: Secondary | ICD-10-CM | POA: Diagnosis not present

## 2022-06-23 DIAGNOSIS — L97322 Non-pressure chronic ulcer of left ankle with fat layer exposed: Secondary | ICD-10-CM | POA: Diagnosis not present

## 2022-06-23 DIAGNOSIS — M810 Age-related osteoporosis without current pathological fracture: Secondary | ICD-10-CM | POA: Diagnosis not present

## 2022-06-23 DIAGNOSIS — J449 Chronic obstructive pulmonary disease, unspecified: Secondary | ICD-10-CM | POA: Diagnosis not present

## 2022-06-23 DIAGNOSIS — Z7982 Long term (current) use of aspirin: Secondary | ICD-10-CM | POA: Diagnosis not present

## 2022-06-23 DIAGNOSIS — R69 Illness, unspecified: Secondary | ICD-10-CM | POA: Diagnosis not present

## 2022-06-23 DIAGNOSIS — Z8616 Personal history of COVID-19: Secondary | ICD-10-CM | POA: Diagnosis not present

## 2022-06-23 DIAGNOSIS — I872 Venous insufficiency (chronic) (peripheral): Secondary | ICD-10-CM | POA: Diagnosis not present

## 2022-06-23 DIAGNOSIS — I10 Essential (primary) hypertension: Secondary | ICD-10-CM | POA: Diagnosis not present

## 2022-06-23 DIAGNOSIS — I890 Lymphedema, not elsewhere classified: Secondary | ICD-10-CM | POA: Diagnosis not present

## 2022-06-24 DIAGNOSIS — I700 Atherosclerosis of aorta: Secondary | ICD-10-CM | POA: Diagnosis not present

## 2022-06-24 DIAGNOSIS — I10 Essential (primary) hypertension: Secondary | ICD-10-CM | POA: Diagnosis not present

## 2022-06-24 DIAGNOSIS — J449 Chronic obstructive pulmonary disease, unspecified: Secondary | ICD-10-CM | POA: Diagnosis not present

## 2022-06-24 DIAGNOSIS — I70203 Unspecified atherosclerosis of native arteries of extremities, bilateral legs: Secondary | ICD-10-CM | POA: Diagnosis not present

## 2022-06-24 DIAGNOSIS — L97519 Non-pressure chronic ulcer of other part of right foot with unspecified severity: Secondary | ICD-10-CM | POA: Diagnosis not present

## 2022-06-24 DIAGNOSIS — I872 Venous insufficiency (chronic) (peripheral): Secondary | ICD-10-CM | POA: Diagnosis not present

## 2022-06-24 DIAGNOSIS — L97321 Non-pressure chronic ulcer of left ankle limited to breakdown of skin: Secondary | ICD-10-CM | POA: Diagnosis not present

## 2022-06-24 DIAGNOSIS — Z7983 Long term (current) use of bisphosphonates: Secondary | ICD-10-CM | POA: Diagnosis not present

## 2022-06-24 DIAGNOSIS — L97922 Non-pressure chronic ulcer of unspecified part of left lower leg with fat layer exposed: Secondary | ICD-10-CM | POA: Diagnosis not present

## 2022-06-24 DIAGNOSIS — Z87891 Personal history of nicotine dependence: Secondary | ICD-10-CM | POA: Diagnosis not present

## 2022-06-24 DIAGNOSIS — M4316 Spondylolisthesis, lumbar region: Secondary | ICD-10-CM | POA: Diagnosis not present

## 2022-06-24 DIAGNOSIS — M5136 Other intervertebral disc degeneration, lumbar region: Secondary | ICD-10-CM | POA: Diagnosis not present

## 2022-06-24 DIAGNOSIS — I701 Atherosclerosis of renal artery: Secondary | ICD-10-CM | POA: Diagnosis not present

## 2022-06-24 DIAGNOSIS — Z7982 Long term (current) use of aspirin: Secondary | ICD-10-CM | POA: Diagnosis not present

## 2022-06-26 DIAGNOSIS — I10 Essential (primary) hypertension: Secondary | ICD-10-CM | POA: Diagnosis not present

## 2022-06-26 DIAGNOSIS — L97322 Non-pressure chronic ulcer of left ankle with fat layer exposed: Secondary | ICD-10-CM | POA: Diagnosis not present

## 2022-06-26 DIAGNOSIS — D500 Iron deficiency anemia secondary to blood loss (chronic): Secondary | ICD-10-CM | POA: Diagnosis not present

## 2022-06-26 DIAGNOSIS — I890 Lymphedema, not elsewhere classified: Secondary | ICD-10-CM | POA: Diagnosis not present

## 2022-06-26 DIAGNOSIS — I872 Venous insufficiency (chronic) (peripheral): Secondary | ICD-10-CM | POA: Diagnosis not present

## 2022-06-26 DIAGNOSIS — Z7982 Long term (current) use of aspirin: Secondary | ICD-10-CM | POA: Diagnosis not present

## 2022-06-26 DIAGNOSIS — J449 Chronic obstructive pulmonary disease, unspecified: Secondary | ICD-10-CM | POA: Diagnosis not present

## 2022-06-26 DIAGNOSIS — I700 Atherosclerosis of aorta: Secondary | ICD-10-CM | POA: Diagnosis not present

## 2022-06-26 DIAGNOSIS — Z8616 Personal history of COVID-19: Secondary | ICD-10-CM | POA: Diagnosis not present

## 2022-06-26 DIAGNOSIS — M810 Age-related osteoporosis without current pathological fracture: Secondary | ICD-10-CM | POA: Diagnosis not present

## 2022-06-26 DIAGNOSIS — R69 Illness, unspecified: Secondary | ICD-10-CM | POA: Diagnosis not present

## 2022-06-26 DIAGNOSIS — Z9181 History of falling: Secondary | ICD-10-CM | POA: Diagnosis not present

## 2022-06-26 DIAGNOSIS — K580 Irritable bowel syndrome with diarrhea: Secondary | ICD-10-CM | POA: Diagnosis not present

## 2022-06-29 DIAGNOSIS — I700 Atherosclerosis of aorta: Secondary | ICD-10-CM | POA: Diagnosis not present

## 2022-06-29 DIAGNOSIS — I890 Lymphedema, not elsewhere classified: Secondary | ICD-10-CM | POA: Diagnosis not present

## 2022-06-29 DIAGNOSIS — K580 Irritable bowel syndrome with diarrhea: Secondary | ICD-10-CM | POA: Diagnosis not present

## 2022-06-29 DIAGNOSIS — D500 Iron deficiency anemia secondary to blood loss (chronic): Secondary | ICD-10-CM | POA: Diagnosis not present

## 2022-06-29 DIAGNOSIS — J449 Chronic obstructive pulmonary disease, unspecified: Secondary | ICD-10-CM | POA: Diagnosis not present

## 2022-06-29 DIAGNOSIS — Z8616 Personal history of COVID-19: Secondary | ICD-10-CM | POA: Diagnosis not present

## 2022-06-29 DIAGNOSIS — Z9181 History of falling: Secondary | ICD-10-CM | POA: Diagnosis not present

## 2022-06-29 DIAGNOSIS — I10 Essential (primary) hypertension: Secondary | ICD-10-CM | POA: Diagnosis not present

## 2022-06-29 DIAGNOSIS — M810 Age-related osteoporosis without current pathological fracture: Secondary | ICD-10-CM | POA: Diagnosis not present

## 2022-06-29 DIAGNOSIS — Z7982 Long term (current) use of aspirin: Secondary | ICD-10-CM | POA: Diagnosis not present

## 2022-06-29 DIAGNOSIS — L97512 Non-pressure chronic ulcer of other part of right foot with fat layer exposed: Secondary | ICD-10-CM | POA: Diagnosis not present

## 2022-06-29 DIAGNOSIS — L97322 Non-pressure chronic ulcer of left ankle with fat layer exposed: Secondary | ICD-10-CM | POA: Diagnosis not present

## 2022-06-29 DIAGNOSIS — R69 Illness, unspecified: Secondary | ICD-10-CM | POA: Diagnosis not present

## 2022-06-29 DIAGNOSIS — I872 Venous insufficiency (chronic) (peripheral): Secondary | ICD-10-CM | POA: Diagnosis not present

## 2022-06-29 DIAGNOSIS — I739 Peripheral vascular disease, unspecified: Secondary | ICD-10-CM | POA: Diagnosis not present

## 2022-07-02 DIAGNOSIS — J449 Chronic obstructive pulmonary disease, unspecified: Secondary | ICD-10-CM | POA: Diagnosis not present

## 2022-07-02 DIAGNOSIS — Z9181 History of falling: Secondary | ICD-10-CM | POA: Diagnosis not present

## 2022-07-02 DIAGNOSIS — R69 Illness, unspecified: Secondary | ICD-10-CM | POA: Diagnosis not present

## 2022-07-02 DIAGNOSIS — D500 Iron deficiency anemia secondary to blood loss (chronic): Secondary | ICD-10-CM | POA: Diagnosis not present

## 2022-07-02 DIAGNOSIS — L97322 Non-pressure chronic ulcer of left ankle with fat layer exposed: Secondary | ICD-10-CM | POA: Diagnosis not present

## 2022-07-02 DIAGNOSIS — I700 Atherosclerosis of aorta: Secondary | ICD-10-CM | POA: Diagnosis not present

## 2022-07-02 DIAGNOSIS — Z7982 Long term (current) use of aspirin: Secondary | ICD-10-CM | POA: Diagnosis not present

## 2022-07-02 DIAGNOSIS — K580 Irritable bowel syndrome with diarrhea: Secondary | ICD-10-CM | POA: Diagnosis not present

## 2022-07-02 DIAGNOSIS — I890 Lymphedema, not elsewhere classified: Secondary | ICD-10-CM | POA: Diagnosis not present

## 2022-07-02 DIAGNOSIS — I872 Venous insufficiency (chronic) (peripheral): Secondary | ICD-10-CM | POA: Diagnosis not present

## 2022-07-02 DIAGNOSIS — Z8616 Personal history of COVID-19: Secondary | ICD-10-CM | POA: Diagnosis not present

## 2022-07-02 DIAGNOSIS — I10 Essential (primary) hypertension: Secondary | ICD-10-CM | POA: Diagnosis not present

## 2022-07-02 DIAGNOSIS — M810 Age-related osteoporosis without current pathological fracture: Secondary | ICD-10-CM | POA: Diagnosis not present

## 2022-07-07 DIAGNOSIS — D500 Iron deficiency anemia secondary to blood loss (chronic): Secondary | ICD-10-CM | POA: Diagnosis not present

## 2022-07-07 DIAGNOSIS — J449 Chronic obstructive pulmonary disease, unspecified: Secondary | ICD-10-CM | POA: Diagnosis not present

## 2022-07-07 DIAGNOSIS — I890 Lymphedema, not elsewhere classified: Secondary | ICD-10-CM | POA: Diagnosis not present

## 2022-07-07 DIAGNOSIS — I10 Essential (primary) hypertension: Secondary | ICD-10-CM | POA: Diagnosis not present

## 2022-07-07 DIAGNOSIS — Z9181 History of falling: Secondary | ICD-10-CM | POA: Diagnosis not present

## 2022-07-07 DIAGNOSIS — M810 Age-related osteoporosis without current pathological fracture: Secondary | ICD-10-CM | POA: Diagnosis not present

## 2022-07-07 DIAGNOSIS — I700 Atherosclerosis of aorta: Secondary | ICD-10-CM | POA: Diagnosis not present

## 2022-07-07 DIAGNOSIS — R69 Illness, unspecified: Secondary | ICD-10-CM | POA: Diagnosis not present

## 2022-07-07 DIAGNOSIS — Z8616 Personal history of COVID-19: Secondary | ICD-10-CM | POA: Diagnosis not present

## 2022-07-07 DIAGNOSIS — Z7982 Long term (current) use of aspirin: Secondary | ICD-10-CM | POA: Diagnosis not present

## 2022-07-07 DIAGNOSIS — L97322 Non-pressure chronic ulcer of left ankle with fat layer exposed: Secondary | ICD-10-CM | POA: Diagnosis not present

## 2022-07-07 DIAGNOSIS — K580 Irritable bowel syndrome with diarrhea: Secondary | ICD-10-CM | POA: Diagnosis not present

## 2022-07-07 DIAGNOSIS — I872 Venous insufficiency (chronic) (peripheral): Secondary | ICD-10-CM | POA: Diagnosis not present

## 2022-07-09 DIAGNOSIS — I700 Atherosclerosis of aorta: Secondary | ICD-10-CM | POA: Diagnosis not present

## 2022-07-09 DIAGNOSIS — R69 Illness, unspecified: Secondary | ICD-10-CM | POA: Diagnosis not present

## 2022-07-09 DIAGNOSIS — L97322 Non-pressure chronic ulcer of left ankle with fat layer exposed: Secondary | ICD-10-CM | POA: Diagnosis not present

## 2022-07-09 DIAGNOSIS — I872 Venous insufficiency (chronic) (peripheral): Secondary | ICD-10-CM | POA: Diagnosis not present

## 2022-07-09 DIAGNOSIS — D500 Iron deficiency anemia secondary to blood loss (chronic): Secondary | ICD-10-CM | POA: Diagnosis not present

## 2022-07-09 DIAGNOSIS — J449 Chronic obstructive pulmonary disease, unspecified: Secondary | ICD-10-CM | POA: Diagnosis not present

## 2022-07-09 DIAGNOSIS — I10 Essential (primary) hypertension: Secondary | ICD-10-CM | POA: Diagnosis not present

## 2022-07-09 DIAGNOSIS — K580 Irritable bowel syndrome with diarrhea: Secondary | ICD-10-CM | POA: Diagnosis not present

## 2022-07-09 DIAGNOSIS — Z9181 History of falling: Secondary | ICD-10-CM | POA: Diagnosis not present

## 2022-07-09 DIAGNOSIS — Z8616 Personal history of COVID-19: Secondary | ICD-10-CM | POA: Diagnosis not present

## 2022-07-09 DIAGNOSIS — Z7982 Long term (current) use of aspirin: Secondary | ICD-10-CM | POA: Diagnosis not present

## 2022-07-09 DIAGNOSIS — M810 Age-related osteoporosis without current pathological fracture: Secondary | ICD-10-CM | POA: Diagnosis not present

## 2022-07-09 DIAGNOSIS — I890 Lymphedema, not elsewhere classified: Secondary | ICD-10-CM | POA: Diagnosis not present

## 2022-07-13 DIAGNOSIS — I700 Atherosclerosis of aorta: Secondary | ICD-10-CM | POA: Diagnosis not present

## 2022-07-13 DIAGNOSIS — J449 Chronic obstructive pulmonary disease, unspecified: Secondary | ICD-10-CM | POA: Diagnosis not present

## 2022-07-13 DIAGNOSIS — Z7982 Long term (current) use of aspirin: Secondary | ICD-10-CM | POA: Diagnosis not present

## 2022-07-13 DIAGNOSIS — Z9181 History of falling: Secondary | ICD-10-CM | POA: Diagnosis not present

## 2022-07-13 DIAGNOSIS — K580 Irritable bowel syndrome with diarrhea: Secondary | ICD-10-CM | POA: Diagnosis not present

## 2022-07-13 DIAGNOSIS — D500 Iron deficiency anemia secondary to blood loss (chronic): Secondary | ICD-10-CM | POA: Diagnosis not present

## 2022-07-13 DIAGNOSIS — I10 Essential (primary) hypertension: Secondary | ICD-10-CM | POA: Diagnosis not present

## 2022-07-13 DIAGNOSIS — L97322 Non-pressure chronic ulcer of left ankle with fat layer exposed: Secondary | ICD-10-CM | POA: Diagnosis not present

## 2022-07-13 DIAGNOSIS — I890 Lymphedema, not elsewhere classified: Secondary | ICD-10-CM | POA: Diagnosis not present

## 2022-07-13 DIAGNOSIS — R69 Illness, unspecified: Secondary | ICD-10-CM | POA: Diagnosis not present

## 2022-07-13 DIAGNOSIS — M810 Age-related osteoporosis without current pathological fracture: Secondary | ICD-10-CM | POA: Diagnosis not present

## 2022-07-13 DIAGNOSIS — Z8616 Personal history of COVID-19: Secondary | ICD-10-CM | POA: Diagnosis not present

## 2022-07-13 DIAGNOSIS — I872 Venous insufficiency (chronic) (peripheral): Secondary | ICD-10-CM | POA: Diagnosis not present

## 2022-07-15 DIAGNOSIS — I10 Essential (primary) hypertension: Secondary | ICD-10-CM | POA: Diagnosis not present

## 2022-07-15 DIAGNOSIS — Z87891 Personal history of nicotine dependence: Secondary | ICD-10-CM | POA: Diagnosis not present

## 2022-07-15 DIAGNOSIS — M4316 Spondylolisthesis, lumbar region: Secondary | ICD-10-CM | POA: Diagnosis not present

## 2022-07-15 DIAGNOSIS — M5136 Other intervertebral disc degeneration, lumbar region: Secondary | ICD-10-CM | POA: Diagnosis not present

## 2022-07-15 DIAGNOSIS — J449 Chronic obstructive pulmonary disease, unspecified: Secondary | ICD-10-CM | POA: Diagnosis not present

## 2022-07-15 DIAGNOSIS — I701 Atherosclerosis of renal artery: Secondary | ICD-10-CM | POA: Diagnosis not present

## 2022-07-15 DIAGNOSIS — Z7982 Long term (current) use of aspirin: Secondary | ICD-10-CM | POA: Diagnosis not present

## 2022-07-15 DIAGNOSIS — L97321 Non-pressure chronic ulcer of left ankle limited to breakdown of skin: Secondary | ICD-10-CM | POA: Diagnosis not present

## 2022-07-15 DIAGNOSIS — I700 Atherosclerosis of aorta: Secondary | ICD-10-CM | POA: Diagnosis not present

## 2022-07-15 DIAGNOSIS — L97519 Non-pressure chronic ulcer of other part of right foot with unspecified severity: Secondary | ICD-10-CM | POA: Diagnosis not present

## 2022-07-15 DIAGNOSIS — I70203 Unspecified atherosclerosis of native arteries of extremities, bilateral legs: Secondary | ICD-10-CM | POA: Diagnosis not present

## 2022-07-15 DIAGNOSIS — I872 Venous insufficiency (chronic) (peripheral): Secondary | ICD-10-CM | POA: Diagnosis not present

## 2022-07-15 DIAGNOSIS — L97922 Non-pressure chronic ulcer of unspecified part of left lower leg with fat layer exposed: Secondary | ICD-10-CM | POA: Diagnosis not present

## 2022-07-15 DIAGNOSIS — Z7983 Long term (current) use of bisphosphonates: Secondary | ICD-10-CM | POA: Diagnosis not present

## 2022-07-16 DIAGNOSIS — K580 Irritable bowel syndrome with diarrhea: Secondary | ICD-10-CM | POA: Diagnosis not present

## 2022-07-16 DIAGNOSIS — Z7982 Long term (current) use of aspirin: Secondary | ICD-10-CM | POA: Diagnosis not present

## 2022-07-16 DIAGNOSIS — I890 Lymphedema, not elsewhere classified: Secondary | ICD-10-CM | POA: Diagnosis not present

## 2022-07-16 DIAGNOSIS — Z8616 Personal history of COVID-19: Secondary | ICD-10-CM | POA: Diagnosis not present

## 2022-07-16 DIAGNOSIS — I872 Venous insufficiency (chronic) (peripheral): Secondary | ICD-10-CM | POA: Diagnosis not present

## 2022-07-16 DIAGNOSIS — I10 Essential (primary) hypertension: Secondary | ICD-10-CM | POA: Diagnosis not present

## 2022-07-16 DIAGNOSIS — J449 Chronic obstructive pulmonary disease, unspecified: Secondary | ICD-10-CM | POA: Diagnosis not present

## 2022-07-16 DIAGNOSIS — R69 Illness, unspecified: Secondary | ICD-10-CM | POA: Diagnosis not present

## 2022-07-16 DIAGNOSIS — M810 Age-related osteoporosis without current pathological fracture: Secondary | ICD-10-CM | POA: Diagnosis not present

## 2022-07-16 DIAGNOSIS — L97322 Non-pressure chronic ulcer of left ankle with fat layer exposed: Secondary | ICD-10-CM | POA: Diagnosis not present

## 2022-07-16 DIAGNOSIS — D500 Iron deficiency anemia secondary to blood loss (chronic): Secondary | ICD-10-CM | POA: Diagnosis not present

## 2022-07-16 DIAGNOSIS — I700 Atherosclerosis of aorta: Secondary | ICD-10-CM | POA: Diagnosis not present

## 2022-07-16 DIAGNOSIS — Z9181 History of falling: Secondary | ICD-10-CM | POA: Diagnosis not present

## 2022-07-24 DIAGNOSIS — I700 Atherosclerosis of aorta: Secondary | ICD-10-CM | POA: Diagnosis not present

## 2022-07-24 DIAGNOSIS — I10 Essential (primary) hypertension: Secondary | ICD-10-CM | POA: Diagnosis not present

## 2022-07-24 DIAGNOSIS — L97322 Non-pressure chronic ulcer of left ankle with fat layer exposed: Secondary | ICD-10-CM | POA: Diagnosis not present

## 2022-07-24 DIAGNOSIS — F321 Major depressive disorder, single episode, moderate: Secondary | ICD-10-CM | POA: Diagnosis not present

## 2022-07-24 DIAGNOSIS — Z7982 Long term (current) use of aspirin: Secondary | ICD-10-CM | POA: Diagnosis not present

## 2022-07-24 DIAGNOSIS — M810 Age-related osteoporosis without current pathological fracture: Secondary | ICD-10-CM | POA: Diagnosis not present

## 2022-07-24 DIAGNOSIS — K580 Irritable bowel syndrome with diarrhea: Secondary | ICD-10-CM | POA: Diagnosis not present

## 2022-07-24 DIAGNOSIS — I872 Venous insufficiency (chronic) (peripheral): Secondary | ICD-10-CM | POA: Diagnosis not present

## 2022-07-24 DIAGNOSIS — F419 Anxiety disorder, unspecified: Secondary | ICD-10-CM | POA: Diagnosis not present

## 2022-07-24 DIAGNOSIS — Z8616 Personal history of COVID-19: Secondary | ICD-10-CM | POA: Diagnosis not present

## 2022-07-24 DIAGNOSIS — D500 Iron deficiency anemia secondary to blood loss (chronic): Secondary | ICD-10-CM | POA: Diagnosis not present

## 2022-07-24 DIAGNOSIS — J449 Chronic obstructive pulmonary disease, unspecified: Secondary | ICD-10-CM | POA: Diagnosis not present

## 2022-07-24 DIAGNOSIS — Z9181 History of falling: Secondary | ICD-10-CM | POA: Diagnosis not present

## 2022-07-24 DIAGNOSIS — I890 Lymphedema, not elsewhere classified: Secondary | ICD-10-CM | POA: Diagnosis not present

## 2022-07-27 DIAGNOSIS — I890 Lymphedema, not elsewhere classified: Secondary | ICD-10-CM | POA: Diagnosis not present

## 2022-07-27 DIAGNOSIS — I700 Atherosclerosis of aorta: Secondary | ICD-10-CM | POA: Diagnosis not present

## 2022-07-27 DIAGNOSIS — F321 Major depressive disorder, single episode, moderate: Secondary | ICD-10-CM | POA: Diagnosis not present

## 2022-07-27 DIAGNOSIS — K580 Irritable bowel syndrome with diarrhea: Secondary | ICD-10-CM | POA: Diagnosis not present

## 2022-07-27 DIAGNOSIS — J449 Chronic obstructive pulmonary disease, unspecified: Secondary | ICD-10-CM | POA: Diagnosis not present

## 2022-07-27 DIAGNOSIS — D500 Iron deficiency anemia secondary to blood loss (chronic): Secondary | ICD-10-CM | POA: Diagnosis not present

## 2022-07-27 DIAGNOSIS — L97322 Non-pressure chronic ulcer of left ankle with fat layer exposed: Secondary | ICD-10-CM | POA: Diagnosis not present

## 2022-07-27 DIAGNOSIS — Z7982 Long term (current) use of aspirin: Secondary | ICD-10-CM | POA: Diagnosis not present

## 2022-07-27 DIAGNOSIS — M810 Age-related osteoporosis without current pathological fracture: Secondary | ICD-10-CM | POA: Diagnosis not present

## 2022-07-27 DIAGNOSIS — I10 Essential (primary) hypertension: Secondary | ICD-10-CM | POA: Diagnosis not present

## 2022-07-27 DIAGNOSIS — Z8616 Personal history of COVID-19: Secondary | ICD-10-CM | POA: Diagnosis not present

## 2022-07-27 DIAGNOSIS — Z9181 History of falling: Secondary | ICD-10-CM | POA: Diagnosis not present

## 2022-07-27 DIAGNOSIS — I872 Venous insufficiency (chronic) (peripheral): Secondary | ICD-10-CM | POA: Diagnosis not present

## 2022-07-27 DIAGNOSIS — F419 Anxiety disorder, unspecified: Secondary | ICD-10-CM | POA: Diagnosis not present

## 2022-07-30 DIAGNOSIS — Z8616 Personal history of COVID-19: Secondary | ICD-10-CM | POA: Diagnosis not present

## 2022-07-30 DIAGNOSIS — I890 Lymphedema, not elsewhere classified: Secondary | ICD-10-CM | POA: Diagnosis not present

## 2022-07-30 DIAGNOSIS — F321 Major depressive disorder, single episode, moderate: Secondary | ICD-10-CM | POA: Diagnosis not present

## 2022-07-30 DIAGNOSIS — F419 Anxiety disorder, unspecified: Secondary | ICD-10-CM | POA: Diagnosis not present

## 2022-07-30 DIAGNOSIS — Z7982 Long term (current) use of aspirin: Secondary | ICD-10-CM | POA: Diagnosis not present

## 2022-07-30 DIAGNOSIS — J449 Chronic obstructive pulmonary disease, unspecified: Secondary | ICD-10-CM | POA: Diagnosis not present

## 2022-07-30 DIAGNOSIS — I872 Venous insufficiency (chronic) (peripheral): Secondary | ICD-10-CM | POA: Diagnosis not present

## 2022-07-30 DIAGNOSIS — I700 Atherosclerosis of aorta: Secondary | ICD-10-CM | POA: Diagnosis not present

## 2022-07-30 DIAGNOSIS — I10 Essential (primary) hypertension: Secondary | ICD-10-CM | POA: Diagnosis not present

## 2022-07-30 DIAGNOSIS — Z9181 History of falling: Secondary | ICD-10-CM | POA: Diagnosis not present

## 2022-07-30 DIAGNOSIS — K580 Irritable bowel syndrome with diarrhea: Secondary | ICD-10-CM | POA: Diagnosis not present

## 2022-07-30 DIAGNOSIS — L97322 Non-pressure chronic ulcer of left ankle with fat layer exposed: Secondary | ICD-10-CM | POA: Diagnosis not present

## 2022-07-30 DIAGNOSIS — D500 Iron deficiency anemia secondary to blood loss (chronic): Secondary | ICD-10-CM | POA: Diagnosis not present

## 2022-07-30 DIAGNOSIS — M810 Age-related osteoporosis without current pathological fracture: Secondary | ICD-10-CM | POA: Diagnosis not present

## 2022-08-04 DIAGNOSIS — M810 Age-related osteoporosis without current pathological fracture: Secondary | ICD-10-CM | POA: Diagnosis not present

## 2022-08-04 DIAGNOSIS — I872 Venous insufficiency (chronic) (peripheral): Secondary | ICD-10-CM | POA: Diagnosis not present

## 2022-08-04 DIAGNOSIS — Z8616 Personal history of COVID-19: Secondary | ICD-10-CM | POA: Diagnosis not present

## 2022-08-04 DIAGNOSIS — Z9181 History of falling: Secondary | ICD-10-CM | POA: Diagnosis not present

## 2022-08-04 DIAGNOSIS — I10 Essential (primary) hypertension: Secondary | ICD-10-CM | POA: Diagnosis not present

## 2022-08-04 DIAGNOSIS — J449 Chronic obstructive pulmonary disease, unspecified: Secondary | ICD-10-CM | POA: Diagnosis not present

## 2022-08-04 DIAGNOSIS — L97322 Non-pressure chronic ulcer of left ankle with fat layer exposed: Secondary | ICD-10-CM | POA: Diagnosis not present

## 2022-08-04 DIAGNOSIS — F321 Major depressive disorder, single episode, moderate: Secondary | ICD-10-CM | POA: Diagnosis not present

## 2022-08-04 DIAGNOSIS — F419 Anxiety disorder, unspecified: Secondary | ICD-10-CM | POA: Diagnosis not present

## 2022-08-04 DIAGNOSIS — Z7982 Long term (current) use of aspirin: Secondary | ICD-10-CM | POA: Diagnosis not present

## 2022-08-04 DIAGNOSIS — I890 Lymphedema, not elsewhere classified: Secondary | ICD-10-CM | POA: Diagnosis not present

## 2022-08-04 DIAGNOSIS — D500 Iron deficiency anemia secondary to blood loss (chronic): Secondary | ICD-10-CM | POA: Diagnosis not present

## 2022-08-04 DIAGNOSIS — I700 Atherosclerosis of aorta: Secondary | ICD-10-CM | POA: Diagnosis not present

## 2022-08-04 DIAGNOSIS — K580 Irritable bowel syndrome with diarrhea: Secondary | ICD-10-CM | POA: Diagnosis not present

## 2022-08-07 DIAGNOSIS — I890 Lymphedema, not elsewhere classified: Secondary | ICD-10-CM | POA: Diagnosis not present

## 2022-08-07 DIAGNOSIS — Z8616 Personal history of COVID-19: Secondary | ICD-10-CM | POA: Diagnosis not present

## 2022-08-07 DIAGNOSIS — K580 Irritable bowel syndrome with diarrhea: Secondary | ICD-10-CM | POA: Diagnosis not present

## 2022-08-07 DIAGNOSIS — F419 Anxiety disorder, unspecified: Secondary | ICD-10-CM | POA: Diagnosis not present

## 2022-08-07 DIAGNOSIS — Z7982 Long term (current) use of aspirin: Secondary | ICD-10-CM | POA: Diagnosis not present

## 2022-08-07 DIAGNOSIS — J449 Chronic obstructive pulmonary disease, unspecified: Secondary | ICD-10-CM | POA: Diagnosis not present

## 2022-08-07 DIAGNOSIS — I700 Atherosclerosis of aorta: Secondary | ICD-10-CM | POA: Diagnosis not present

## 2022-08-07 DIAGNOSIS — I872 Venous insufficiency (chronic) (peripheral): Secondary | ICD-10-CM | POA: Diagnosis not present

## 2022-08-07 DIAGNOSIS — L97322 Non-pressure chronic ulcer of left ankle with fat layer exposed: Secondary | ICD-10-CM | POA: Diagnosis not present

## 2022-08-07 DIAGNOSIS — M810 Age-related osteoporosis without current pathological fracture: Secondary | ICD-10-CM | POA: Diagnosis not present

## 2022-08-07 DIAGNOSIS — F321 Major depressive disorder, single episode, moderate: Secondary | ICD-10-CM | POA: Diagnosis not present

## 2022-08-07 DIAGNOSIS — D500 Iron deficiency anemia secondary to blood loss (chronic): Secondary | ICD-10-CM | POA: Diagnosis not present

## 2022-08-07 DIAGNOSIS — Z9181 History of falling: Secondary | ICD-10-CM | POA: Diagnosis not present

## 2022-08-07 DIAGNOSIS — I10 Essential (primary) hypertension: Secondary | ICD-10-CM | POA: Diagnosis not present

## 2022-08-10 DIAGNOSIS — Z7982 Long term (current) use of aspirin: Secondary | ICD-10-CM | POA: Diagnosis not present

## 2022-08-10 DIAGNOSIS — F419 Anxiety disorder, unspecified: Secondary | ICD-10-CM | POA: Diagnosis not present

## 2022-08-10 DIAGNOSIS — I10 Essential (primary) hypertension: Secondary | ICD-10-CM | POA: Diagnosis not present

## 2022-08-10 DIAGNOSIS — I890 Lymphedema, not elsewhere classified: Secondary | ICD-10-CM | POA: Diagnosis not present

## 2022-08-10 DIAGNOSIS — Z8616 Personal history of COVID-19: Secondary | ICD-10-CM | POA: Diagnosis not present

## 2022-08-10 DIAGNOSIS — M810 Age-related osteoporosis without current pathological fracture: Secondary | ICD-10-CM | POA: Diagnosis not present

## 2022-08-10 DIAGNOSIS — I700 Atherosclerosis of aorta: Secondary | ICD-10-CM | POA: Diagnosis not present

## 2022-08-10 DIAGNOSIS — L97322 Non-pressure chronic ulcer of left ankle with fat layer exposed: Secondary | ICD-10-CM | POA: Diagnosis not present

## 2022-08-10 DIAGNOSIS — I872 Venous insufficiency (chronic) (peripheral): Secondary | ICD-10-CM | POA: Diagnosis not present

## 2022-08-10 DIAGNOSIS — K580 Irritable bowel syndrome with diarrhea: Secondary | ICD-10-CM | POA: Diagnosis not present

## 2022-08-10 DIAGNOSIS — Z9181 History of falling: Secondary | ICD-10-CM | POA: Diagnosis not present

## 2022-08-10 DIAGNOSIS — D500 Iron deficiency anemia secondary to blood loss (chronic): Secondary | ICD-10-CM | POA: Diagnosis not present

## 2022-08-10 DIAGNOSIS — J449 Chronic obstructive pulmonary disease, unspecified: Secondary | ICD-10-CM | POA: Diagnosis not present

## 2022-08-10 DIAGNOSIS — F321 Major depressive disorder, single episode, moderate: Secondary | ICD-10-CM | POA: Diagnosis not present

## 2022-08-14 DIAGNOSIS — I700 Atherosclerosis of aorta: Secondary | ICD-10-CM | POA: Diagnosis not present

## 2022-08-14 DIAGNOSIS — I872 Venous insufficiency (chronic) (peripheral): Secondary | ICD-10-CM | POA: Diagnosis not present

## 2022-08-14 DIAGNOSIS — Z7982 Long term (current) use of aspirin: Secondary | ICD-10-CM | POA: Diagnosis not present

## 2022-08-14 DIAGNOSIS — F419 Anxiety disorder, unspecified: Secondary | ICD-10-CM | POA: Diagnosis not present

## 2022-08-14 DIAGNOSIS — Z9181 History of falling: Secondary | ICD-10-CM | POA: Diagnosis not present

## 2022-08-14 DIAGNOSIS — F321 Major depressive disorder, single episode, moderate: Secondary | ICD-10-CM | POA: Diagnosis not present

## 2022-08-14 DIAGNOSIS — I10 Essential (primary) hypertension: Secondary | ICD-10-CM | POA: Diagnosis not present

## 2022-08-14 DIAGNOSIS — D500 Iron deficiency anemia secondary to blood loss (chronic): Secondary | ICD-10-CM | POA: Diagnosis not present

## 2022-08-14 DIAGNOSIS — L97322 Non-pressure chronic ulcer of left ankle with fat layer exposed: Secondary | ICD-10-CM | POA: Diagnosis not present

## 2022-08-14 DIAGNOSIS — M810 Age-related osteoporosis without current pathological fracture: Secondary | ICD-10-CM | POA: Diagnosis not present

## 2022-08-14 DIAGNOSIS — J449 Chronic obstructive pulmonary disease, unspecified: Secondary | ICD-10-CM | POA: Diagnosis not present

## 2022-08-14 DIAGNOSIS — K580 Irritable bowel syndrome with diarrhea: Secondary | ICD-10-CM | POA: Diagnosis not present

## 2022-08-14 DIAGNOSIS — Z8616 Personal history of COVID-19: Secondary | ICD-10-CM | POA: Diagnosis not present

## 2022-08-14 DIAGNOSIS — I890 Lymphedema, not elsewhere classified: Secondary | ICD-10-CM | POA: Diagnosis not present

## 2022-08-17 DIAGNOSIS — L97322 Non-pressure chronic ulcer of left ankle with fat layer exposed: Secondary | ICD-10-CM | POA: Diagnosis not present

## 2022-08-17 DIAGNOSIS — K580 Irritable bowel syndrome with diarrhea: Secondary | ICD-10-CM | POA: Diagnosis not present

## 2022-08-17 DIAGNOSIS — Z7982 Long term (current) use of aspirin: Secondary | ICD-10-CM | POA: Diagnosis not present

## 2022-08-17 DIAGNOSIS — M810 Age-related osteoporosis without current pathological fracture: Secondary | ICD-10-CM | POA: Diagnosis not present

## 2022-08-17 DIAGNOSIS — F321 Major depressive disorder, single episode, moderate: Secondary | ICD-10-CM | POA: Diagnosis not present

## 2022-08-17 DIAGNOSIS — D500 Iron deficiency anemia secondary to blood loss (chronic): Secondary | ICD-10-CM | POA: Diagnosis not present

## 2022-08-17 DIAGNOSIS — I872 Venous insufficiency (chronic) (peripheral): Secondary | ICD-10-CM | POA: Diagnosis not present

## 2022-08-17 DIAGNOSIS — Z8616 Personal history of COVID-19: Secondary | ICD-10-CM | POA: Diagnosis not present

## 2022-08-17 DIAGNOSIS — Z9181 History of falling: Secondary | ICD-10-CM | POA: Diagnosis not present

## 2022-08-17 DIAGNOSIS — I10 Essential (primary) hypertension: Secondary | ICD-10-CM | POA: Diagnosis not present

## 2022-08-17 DIAGNOSIS — F419 Anxiety disorder, unspecified: Secondary | ICD-10-CM | POA: Diagnosis not present

## 2022-08-17 DIAGNOSIS — I700 Atherosclerosis of aorta: Secondary | ICD-10-CM | POA: Diagnosis not present

## 2022-08-17 DIAGNOSIS — J449 Chronic obstructive pulmonary disease, unspecified: Secondary | ICD-10-CM | POA: Diagnosis not present

## 2022-08-17 DIAGNOSIS — I890 Lymphedema, not elsewhere classified: Secondary | ICD-10-CM | POA: Diagnosis not present

## 2022-08-19 DIAGNOSIS — Z7982 Long term (current) use of aspirin: Secondary | ICD-10-CM | POA: Diagnosis not present

## 2022-08-19 DIAGNOSIS — I8392 Asymptomatic varicose veins of left lower extremity: Secondary | ICD-10-CM | POA: Diagnosis not present

## 2022-08-19 DIAGNOSIS — L97922 Non-pressure chronic ulcer of unspecified part of left lower leg with fat layer exposed: Secondary | ICD-10-CM | POA: Diagnosis not present

## 2022-08-19 DIAGNOSIS — J449 Chronic obstructive pulmonary disease, unspecified: Secondary | ICD-10-CM | POA: Diagnosis not present

## 2022-08-19 DIAGNOSIS — I700 Atherosclerosis of aorta: Secondary | ICD-10-CM | POA: Diagnosis not present

## 2022-08-19 DIAGNOSIS — L97321 Non-pressure chronic ulcer of left ankle limited to breakdown of skin: Secondary | ICD-10-CM | POA: Diagnosis not present

## 2022-08-19 DIAGNOSIS — I10 Essential (primary) hypertension: Secondary | ICD-10-CM | POA: Diagnosis not present

## 2022-08-19 DIAGNOSIS — Z7983 Long term (current) use of bisphosphonates: Secondary | ICD-10-CM | POA: Diagnosis not present

## 2022-08-19 DIAGNOSIS — Z87891 Personal history of nicotine dependence: Secondary | ICD-10-CM | POA: Diagnosis not present

## 2022-08-19 DIAGNOSIS — I83019 Varicose veins of right lower extremity with ulcer of unspecified site: Secondary | ICD-10-CM | POA: Diagnosis not present

## 2022-08-19 DIAGNOSIS — Z20822 Contact with and (suspected) exposure to covid-19: Secondary | ICD-10-CM | POA: Diagnosis not present

## 2022-08-19 DIAGNOSIS — R600 Localized edema: Secondary | ICD-10-CM | POA: Diagnosis not present

## 2022-08-19 DIAGNOSIS — M5136 Other intervertebral disc degeneration, lumbar region: Secondary | ICD-10-CM | POA: Diagnosis not present

## 2022-08-19 DIAGNOSIS — I701 Atherosclerosis of renal artery: Secondary | ICD-10-CM | POA: Diagnosis not present

## 2022-08-19 DIAGNOSIS — L97919 Non-pressure chronic ulcer of unspecified part of right lower leg with unspecified severity: Secondary | ICD-10-CM | POA: Diagnosis not present

## 2022-08-19 DIAGNOSIS — K4090 Unilateral inguinal hernia, without obstruction or gangrene, not specified as recurrent: Secondary | ICD-10-CM | POA: Diagnosis not present

## 2022-08-19 DIAGNOSIS — M4316 Spondylolisthesis, lumbar region: Secondary | ICD-10-CM | POA: Diagnosis not present

## 2022-08-19 DIAGNOSIS — Z792 Long term (current) use of antibiotics: Secondary | ICD-10-CM | POA: Diagnosis not present

## 2022-08-19 DIAGNOSIS — I872 Venous insufficiency (chronic) (peripheral): Secondary | ICD-10-CM | POA: Diagnosis not present

## 2022-08-19 DIAGNOSIS — I70203 Unspecified atherosclerosis of native arteries of extremities, bilateral legs: Secondary | ICD-10-CM | POA: Diagnosis not present

## 2022-08-21 DIAGNOSIS — I872 Venous insufficiency (chronic) (peripheral): Secondary | ICD-10-CM | POA: Diagnosis not present

## 2022-08-21 DIAGNOSIS — Z9181 History of falling: Secondary | ICD-10-CM | POA: Diagnosis not present

## 2022-08-21 DIAGNOSIS — Z8616 Personal history of COVID-19: Secondary | ICD-10-CM | POA: Diagnosis not present

## 2022-08-21 DIAGNOSIS — F321 Major depressive disorder, single episode, moderate: Secondary | ICD-10-CM | POA: Diagnosis not present

## 2022-08-21 DIAGNOSIS — I890 Lymphedema, not elsewhere classified: Secondary | ICD-10-CM | POA: Diagnosis not present

## 2022-08-21 DIAGNOSIS — I700 Atherosclerosis of aorta: Secondary | ICD-10-CM | POA: Diagnosis not present

## 2022-08-21 DIAGNOSIS — F419 Anxiety disorder, unspecified: Secondary | ICD-10-CM | POA: Diagnosis not present

## 2022-08-21 DIAGNOSIS — K580 Irritable bowel syndrome with diarrhea: Secondary | ICD-10-CM | POA: Diagnosis not present

## 2022-08-21 DIAGNOSIS — I10 Essential (primary) hypertension: Secondary | ICD-10-CM | POA: Diagnosis not present

## 2022-08-21 DIAGNOSIS — Z7982 Long term (current) use of aspirin: Secondary | ICD-10-CM | POA: Diagnosis not present

## 2022-08-21 DIAGNOSIS — M810 Age-related osteoporosis without current pathological fracture: Secondary | ICD-10-CM | POA: Diagnosis not present

## 2022-08-21 DIAGNOSIS — J449 Chronic obstructive pulmonary disease, unspecified: Secondary | ICD-10-CM | POA: Diagnosis not present

## 2022-08-21 DIAGNOSIS — D500 Iron deficiency anemia secondary to blood loss (chronic): Secondary | ICD-10-CM | POA: Diagnosis not present

## 2022-08-21 DIAGNOSIS — L97322 Non-pressure chronic ulcer of left ankle with fat layer exposed: Secondary | ICD-10-CM | POA: Diagnosis not present

## 2022-08-24 DIAGNOSIS — Z7982 Long term (current) use of aspirin: Secondary | ICD-10-CM | POA: Diagnosis not present

## 2022-08-24 DIAGNOSIS — Z9181 History of falling: Secondary | ICD-10-CM | POA: Diagnosis not present

## 2022-08-24 DIAGNOSIS — K580 Irritable bowel syndrome with diarrhea: Secondary | ICD-10-CM | POA: Diagnosis not present

## 2022-08-24 DIAGNOSIS — I890 Lymphedema, not elsewhere classified: Secondary | ICD-10-CM | POA: Diagnosis not present

## 2022-08-24 DIAGNOSIS — Z8616 Personal history of COVID-19: Secondary | ICD-10-CM | POA: Diagnosis not present

## 2022-08-24 DIAGNOSIS — I872 Venous insufficiency (chronic) (peripheral): Secondary | ICD-10-CM | POA: Diagnosis not present

## 2022-08-24 DIAGNOSIS — I10 Essential (primary) hypertension: Secondary | ICD-10-CM | POA: Diagnosis not present

## 2022-08-24 DIAGNOSIS — M810 Age-related osteoporosis without current pathological fracture: Secondary | ICD-10-CM | POA: Diagnosis not present

## 2022-08-24 DIAGNOSIS — I700 Atherosclerosis of aorta: Secondary | ICD-10-CM | POA: Diagnosis not present

## 2022-08-24 DIAGNOSIS — R69 Illness, unspecified: Secondary | ICD-10-CM | POA: Diagnosis not present

## 2022-08-24 DIAGNOSIS — D500 Iron deficiency anemia secondary to blood loss (chronic): Secondary | ICD-10-CM | POA: Diagnosis not present

## 2022-08-24 DIAGNOSIS — J449 Chronic obstructive pulmonary disease, unspecified: Secondary | ICD-10-CM | POA: Diagnosis not present

## 2022-08-24 DIAGNOSIS — L97322 Non-pressure chronic ulcer of left ankle with fat layer exposed: Secondary | ICD-10-CM | POA: Diagnosis not present

## 2022-08-27 DIAGNOSIS — D500 Iron deficiency anemia secondary to blood loss (chronic): Secondary | ICD-10-CM | POA: Diagnosis not present

## 2022-08-27 DIAGNOSIS — K580 Irritable bowel syndrome with diarrhea: Secondary | ICD-10-CM | POA: Diagnosis not present

## 2022-08-27 DIAGNOSIS — M810 Age-related osteoporosis without current pathological fracture: Secondary | ICD-10-CM | POA: Diagnosis not present

## 2022-08-27 DIAGNOSIS — I700 Atherosclerosis of aorta: Secondary | ICD-10-CM | POA: Diagnosis not present

## 2022-08-27 DIAGNOSIS — L97322 Non-pressure chronic ulcer of left ankle with fat layer exposed: Secondary | ICD-10-CM | POA: Diagnosis not present

## 2022-08-27 DIAGNOSIS — Z9181 History of falling: Secondary | ICD-10-CM | POA: Diagnosis not present

## 2022-08-27 DIAGNOSIS — R69 Illness, unspecified: Secondary | ICD-10-CM | POA: Diagnosis not present

## 2022-08-27 DIAGNOSIS — Z7982 Long term (current) use of aspirin: Secondary | ICD-10-CM | POA: Diagnosis not present

## 2022-08-27 DIAGNOSIS — I10 Essential (primary) hypertension: Secondary | ICD-10-CM | POA: Diagnosis not present

## 2022-08-27 DIAGNOSIS — I890 Lymphedema, not elsewhere classified: Secondary | ICD-10-CM | POA: Diagnosis not present

## 2022-08-27 DIAGNOSIS — I872 Venous insufficiency (chronic) (peripheral): Secondary | ICD-10-CM | POA: Diagnosis not present

## 2022-08-27 DIAGNOSIS — Z8616 Personal history of COVID-19: Secondary | ICD-10-CM | POA: Diagnosis not present

## 2022-08-27 DIAGNOSIS — J449 Chronic obstructive pulmonary disease, unspecified: Secondary | ICD-10-CM | POA: Diagnosis not present

## 2022-09-01 DIAGNOSIS — K580 Irritable bowel syndrome with diarrhea: Secondary | ICD-10-CM | POA: Diagnosis not present

## 2022-09-01 DIAGNOSIS — I10 Essential (primary) hypertension: Secondary | ICD-10-CM | POA: Diagnosis not present

## 2022-09-01 DIAGNOSIS — I872 Venous insufficiency (chronic) (peripheral): Secondary | ICD-10-CM | POA: Diagnosis not present

## 2022-09-01 DIAGNOSIS — Z9181 History of falling: Secondary | ICD-10-CM | POA: Diagnosis not present

## 2022-09-01 DIAGNOSIS — J449 Chronic obstructive pulmonary disease, unspecified: Secondary | ICD-10-CM | POA: Diagnosis not present

## 2022-09-01 DIAGNOSIS — M810 Age-related osteoporosis without current pathological fracture: Secondary | ICD-10-CM | POA: Diagnosis not present

## 2022-09-01 DIAGNOSIS — Z8616 Personal history of COVID-19: Secondary | ICD-10-CM | POA: Diagnosis not present

## 2022-09-01 DIAGNOSIS — I700 Atherosclerosis of aorta: Secondary | ICD-10-CM | POA: Diagnosis not present

## 2022-09-01 DIAGNOSIS — L97322 Non-pressure chronic ulcer of left ankle with fat layer exposed: Secondary | ICD-10-CM | POA: Diagnosis not present

## 2022-09-01 DIAGNOSIS — D500 Iron deficiency anemia secondary to blood loss (chronic): Secondary | ICD-10-CM | POA: Diagnosis not present

## 2022-09-01 DIAGNOSIS — Z7982 Long term (current) use of aspirin: Secondary | ICD-10-CM | POA: Diagnosis not present

## 2022-09-01 DIAGNOSIS — R69 Illness, unspecified: Secondary | ICD-10-CM | POA: Diagnosis not present

## 2022-09-01 DIAGNOSIS — I890 Lymphedema, not elsewhere classified: Secondary | ICD-10-CM | POA: Diagnosis not present

## 2022-09-04 DIAGNOSIS — R69 Illness, unspecified: Secondary | ICD-10-CM | POA: Diagnosis not present

## 2022-09-04 DIAGNOSIS — L97322 Non-pressure chronic ulcer of left ankle with fat layer exposed: Secondary | ICD-10-CM | POA: Diagnosis not present

## 2022-09-04 DIAGNOSIS — M810 Age-related osteoporosis without current pathological fracture: Secondary | ICD-10-CM | POA: Diagnosis not present

## 2022-09-04 DIAGNOSIS — Z9181 History of falling: Secondary | ICD-10-CM | POA: Diagnosis not present

## 2022-09-04 DIAGNOSIS — Z8616 Personal history of COVID-19: Secondary | ICD-10-CM | POA: Diagnosis not present

## 2022-09-04 DIAGNOSIS — K580 Irritable bowel syndrome with diarrhea: Secondary | ICD-10-CM | POA: Diagnosis not present

## 2022-09-04 DIAGNOSIS — I10 Essential (primary) hypertension: Secondary | ICD-10-CM | POA: Diagnosis not present

## 2022-09-04 DIAGNOSIS — I872 Venous insufficiency (chronic) (peripheral): Secondary | ICD-10-CM | POA: Diagnosis not present

## 2022-09-04 DIAGNOSIS — Z7982 Long term (current) use of aspirin: Secondary | ICD-10-CM | POA: Diagnosis not present

## 2022-09-04 DIAGNOSIS — I890 Lymphedema, not elsewhere classified: Secondary | ICD-10-CM | POA: Diagnosis not present

## 2022-09-04 DIAGNOSIS — I700 Atherosclerosis of aorta: Secondary | ICD-10-CM | POA: Diagnosis not present

## 2022-09-04 DIAGNOSIS — D500 Iron deficiency anemia secondary to blood loss (chronic): Secondary | ICD-10-CM | POA: Diagnosis not present

## 2022-09-04 DIAGNOSIS — J449 Chronic obstructive pulmonary disease, unspecified: Secondary | ICD-10-CM | POA: Diagnosis not present

## 2022-09-07 DIAGNOSIS — I872 Venous insufficiency (chronic) (peripheral): Secondary | ICD-10-CM | POA: Diagnosis not present

## 2022-09-07 DIAGNOSIS — I700 Atherosclerosis of aorta: Secondary | ICD-10-CM | POA: Diagnosis not present

## 2022-09-07 DIAGNOSIS — Z8616 Personal history of COVID-19: Secondary | ICD-10-CM | POA: Diagnosis not present

## 2022-09-07 DIAGNOSIS — I10 Essential (primary) hypertension: Secondary | ICD-10-CM | POA: Diagnosis not present

## 2022-09-07 DIAGNOSIS — J449 Chronic obstructive pulmonary disease, unspecified: Secondary | ICD-10-CM | POA: Diagnosis not present

## 2022-09-07 DIAGNOSIS — R69 Illness, unspecified: Secondary | ICD-10-CM | POA: Diagnosis not present

## 2022-09-07 DIAGNOSIS — K580 Irritable bowel syndrome with diarrhea: Secondary | ICD-10-CM | POA: Diagnosis not present

## 2022-09-07 DIAGNOSIS — Z9181 History of falling: Secondary | ICD-10-CM | POA: Diagnosis not present

## 2022-09-07 DIAGNOSIS — L97322 Non-pressure chronic ulcer of left ankle with fat layer exposed: Secondary | ICD-10-CM | POA: Diagnosis not present

## 2022-09-07 DIAGNOSIS — D500 Iron deficiency anemia secondary to blood loss (chronic): Secondary | ICD-10-CM | POA: Diagnosis not present

## 2022-09-07 DIAGNOSIS — I890 Lymphedema, not elsewhere classified: Secondary | ICD-10-CM | POA: Diagnosis not present

## 2022-09-07 DIAGNOSIS — Z7982 Long term (current) use of aspirin: Secondary | ICD-10-CM | POA: Diagnosis not present

## 2022-09-07 DIAGNOSIS — M810 Age-related osteoporosis without current pathological fracture: Secondary | ICD-10-CM | POA: Diagnosis not present

## 2022-09-10 DIAGNOSIS — Z9181 History of falling: Secondary | ICD-10-CM | POA: Diagnosis not present

## 2022-09-10 DIAGNOSIS — I10 Essential (primary) hypertension: Secondary | ICD-10-CM | POA: Diagnosis not present

## 2022-09-10 DIAGNOSIS — Z8616 Personal history of COVID-19: Secondary | ICD-10-CM | POA: Diagnosis not present

## 2022-09-10 DIAGNOSIS — Z7982 Long term (current) use of aspirin: Secondary | ICD-10-CM | POA: Diagnosis not present

## 2022-09-10 DIAGNOSIS — M810 Age-related osteoporosis without current pathological fracture: Secondary | ICD-10-CM | POA: Diagnosis not present

## 2022-09-10 DIAGNOSIS — I872 Venous insufficiency (chronic) (peripheral): Secondary | ICD-10-CM | POA: Diagnosis not present

## 2022-09-10 DIAGNOSIS — K580 Irritable bowel syndrome with diarrhea: Secondary | ICD-10-CM | POA: Diagnosis not present

## 2022-09-10 DIAGNOSIS — D500 Iron deficiency anemia secondary to blood loss (chronic): Secondary | ICD-10-CM | POA: Diagnosis not present

## 2022-09-10 DIAGNOSIS — I700 Atherosclerosis of aorta: Secondary | ICD-10-CM | POA: Diagnosis not present

## 2022-09-10 DIAGNOSIS — R69 Illness, unspecified: Secondary | ICD-10-CM | POA: Diagnosis not present

## 2022-09-10 DIAGNOSIS — L97322 Non-pressure chronic ulcer of left ankle with fat layer exposed: Secondary | ICD-10-CM | POA: Diagnosis not present

## 2022-09-10 DIAGNOSIS — I890 Lymphedema, not elsewhere classified: Secondary | ICD-10-CM | POA: Diagnosis not present

## 2022-09-10 DIAGNOSIS — J449 Chronic obstructive pulmonary disease, unspecified: Secondary | ICD-10-CM | POA: Diagnosis not present

## 2022-09-15 DIAGNOSIS — L97322 Non-pressure chronic ulcer of left ankle with fat layer exposed: Secondary | ICD-10-CM | POA: Diagnosis not present

## 2022-09-15 DIAGNOSIS — K580 Irritable bowel syndrome with diarrhea: Secondary | ICD-10-CM | POA: Diagnosis not present

## 2022-09-15 DIAGNOSIS — J449 Chronic obstructive pulmonary disease, unspecified: Secondary | ICD-10-CM | POA: Diagnosis not present

## 2022-09-15 DIAGNOSIS — Z8616 Personal history of COVID-19: Secondary | ICD-10-CM | POA: Diagnosis not present

## 2022-09-15 DIAGNOSIS — Z9181 History of falling: Secondary | ICD-10-CM | POA: Diagnosis not present

## 2022-09-15 DIAGNOSIS — I700 Atherosclerosis of aorta: Secondary | ICD-10-CM | POA: Diagnosis not present

## 2022-09-15 DIAGNOSIS — I10 Essential (primary) hypertension: Secondary | ICD-10-CM | POA: Diagnosis not present

## 2022-09-15 DIAGNOSIS — Z7982 Long term (current) use of aspirin: Secondary | ICD-10-CM | POA: Diagnosis not present

## 2022-09-15 DIAGNOSIS — I890 Lymphedema, not elsewhere classified: Secondary | ICD-10-CM | POA: Diagnosis not present

## 2022-09-15 DIAGNOSIS — R69 Illness, unspecified: Secondary | ICD-10-CM | POA: Diagnosis not present

## 2022-09-15 DIAGNOSIS — M810 Age-related osteoporosis without current pathological fracture: Secondary | ICD-10-CM | POA: Diagnosis not present

## 2022-09-15 DIAGNOSIS — D500 Iron deficiency anemia secondary to blood loss (chronic): Secondary | ICD-10-CM | POA: Diagnosis not present

## 2022-09-15 DIAGNOSIS — I872 Venous insufficiency (chronic) (peripheral): Secondary | ICD-10-CM | POA: Diagnosis not present

## 2022-09-18 DIAGNOSIS — I700 Atherosclerosis of aorta: Secondary | ICD-10-CM | POA: Diagnosis not present

## 2022-09-18 DIAGNOSIS — Z9181 History of falling: Secondary | ICD-10-CM | POA: Diagnosis not present

## 2022-09-18 DIAGNOSIS — L97322 Non-pressure chronic ulcer of left ankle with fat layer exposed: Secondary | ICD-10-CM | POA: Diagnosis not present

## 2022-09-18 DIAGNOSIS — R69 Illness, unspecified: Secondary | ICD-10-CM | POA: Diagnosis not present

## 2022-09-18 DIAGNOSIS — M810 Age-related osteoporosis without current pathological fracture: Secondary | ICD-10-CM | POA: Diagnosis not present

## 2022-09-18 DIAGNOSIS — J449 Chronic obstructive pulmonary disease, unspecified: Secondary | ICD-10-CM | POA: Diagnosis not present

## 2022-09-18 DIAGNOSIS — I10 Essential (primary) hypertension: Secondary | ICD-10-CM | POA: Diagnosis not present

## 2022-09-18 DIAGNOSIS — K580 Irritable bowel syndrome with diarrhea: Secondary | ICD-10-CM | POA: Diagnosis not present

## 2022-09-18 DIAGNOSIS — Z8616 Personal history of COVID-19: Secondary | ICD-10-CM | POA: Diagnosis not present

## 2022-09-18 DIAGNOSIS — I890 Lymphedema, not elsewhere classified: Secondary | ICD-10-CM | POA: Diagnosis not present

## 2022-09-18 DIAGNOSIS — Z7982 Long term (current) use of aspirin: Secondary | ICD-10-CM | POA: Diagnosis not present

## 2022-09-18 DIAGNOSIS — I872 Venous insufficiency (chronic) (peripheral): Secondary | ICD-10-CM | POA: Diagnosis not present

## 2022-09-18 DIAGNOSIS — D500 Iron deficiency anemia secondary to blood loss (chronic): Secondary | ICD-10-CM | POA: Diagnosis not present

## 2022-09-21 DIAGNOSIS — B351 Tinea unguium: Secondary | ICD-10-CM | POA: Diagnosis not present

## 2022-09-21 DIAGNOSIS — J449 Chronic obstructive pulmonary disease, unspecified: Secondary | ICD-10-CM | POA: Diagnosis not present

## 2022-09-21 DIAGNOSIS — I872 Venous insufficiency (chronic) (peripheral): Secondary | ICD-10-CM | POA: Diagnosis not present

## 2022-09-21 DIAGNOSIS — Z9181 History of falling: Secondary | ICD-10-CM | POA: Diagnosis not present

## 2022-09-21 DIAGNOSIS — D500 Iron deficiency anemia secondary to blood loss (chronic): Secondary | ICD-10-CM | POA: Diagnosis not present

## 2022-09-21 DIAGNOSIS — K580 Irritable bowel syndrome with diarrhea: Secondary | ICD-10-CM | POA: Diagnosis not present

## 2022-09-21 DIAGNOSIS — Z8616 Personal history of COVID-19: Secondary | ICD-10-CM | POA: Diagnosis not present

## 2022-09-21 DIAGNOSIS — R69 Illness, unspecified: Secondary | ICD-10-CM | POA: Diagnosis not present

## 2022-09-21 DIAGNOSIS — I890 Lymphedema, not elsewhere classified: Secondary | ICD-10-CM | POA: Diagnosis not present

## 2022-09-21 DIAGNOSIS — L851 Acquired keratosis [keratoderma] palmaris et plantaris: Secondary | ICD-10-CM | POA: Diagnosis not present

## 2022-09-21 DIAGNOSIS — I700 Atherosclerosis of aorta: Secondary | ICD-10-CM | POA: Diagnosis not present

## 2022-09-21 DIAGNOSIS — I10 Essential (primary) hypertension: Secondary | ICD-10-CM | POA: Diagnosis not present

## 2022-09-21 DIAGNOSIS — Z7982 Long term (current) use of aspirin: Secondary | ICD-10-CM | POA: Diagnosis not present

## 2022-09-21 DIAGNOSIS — I739 Peripheral vascular disease, unspecified: Secondary | ICD-10-CM | POA: Diagnosis not present

## 2022-09-21 DIAGNOSIS — M810 Age-related osteoporosis without current pathological fracture: Secondary | ICD-10-CM | POA: Diagnosis not present

## 2022-09-21 DIAGNOSIS — M79675 Pain in left toe(s): Secondary | ICD-10-CM | POA: Diagnosis not present

## 2022-09-21 DIAGNOSIS — L97322 Non-pressure chronic ulcer of left ankle with fat layer exposed: Secondary | ICD-10-CM | POA: Diagnosis not present

## 2022-09-21 DIAGNOSIS — M79674 Pain in right toe(s): Secondary | ICD-10-CM | POA: Diagnosis not present

## 2022-09-23 DIAGNOSIS — I700 Atherosclerosis of aorta: Secondary | ICD-10-CM | POA: Diagnosis not present

## 2022-09-23 DIAGNOSIS — I7092 Chronic total occlusion of artery of the extremities: Secondary | ICD-10-CM | POA: Diagnosis not present

## 2022-09-23 DIAGNOSIS — L97819 Non-pressure chronic ulcer of other part of right lower leg with unspecified severity: Secondary | ICD-10-CM | POA: Diagnosis not present

## 2022-09-23 DIAGNOSIS — I10 Essential (primary) hypertension: Secondary | ICD-10-CM | POA: Diagnosis not present

## 2022-09-23 DIAGNOSIS — J449 Chronic obstructive pulmonary disease, unspecified: Secondary | ICD-10-CM | POA: Diagnosis not present

## 2022-09-23 DIAGNOSIS — I83019 Varicose veins of right lower extremity with ulcer of unspecified site: Secondary | ICD-10-CM | POA: Diagnosis not present

## 2022-09-23 DIAGNOSIS — I872 Venous insufficiency (chronic) (peripheral): Secondary | ICD-10-CM | POA: Diagnosis not present

## 2022-09-23 DIAGNOSIS — I83028 Varicose veins of left lower extremity with ulcer other part of lower leg: Secondary | ICD-10-CM | POA: Diagnosis not present

## 2022-09-23 DIAGNOSIS — Z79899 Other long term (current) drug therapy: Secondary | ICD-10-CM | POA: Diagnosis not present

## 2022-09-23 DIAGNOSIS — I83018 Varicose veins of right lower extremity with ulcer other part of lower leg: Secondary | ICD-10-CM | POA: Diagnosis not present

## 2022-09-23 DIAGNOSIS — I739 Peripheral vascular disease, unspecified: Secondary | ICD-10-CM | POA: Diagnosis not present

## 2022-09-23 DIAGNOSIS — L97919 Non-pressure chronic ulcer of unspecified part of right lower leg with unspecified severity: Secondary | ICD-10-CM | POA: Diagnosis not present

## 2022-09-23 DIAGNOSIS — L97321 Non-pressure chronic ulcer of left ankle limited to breakdown of skin: Secondary | ICD-10-CM | POA: Diagnosis not present

## 2022-09-23 DIAGNOSIS — Z87891 Personal history of nicotine dependence: Secondary | ICD-10-CM | POA: Diagnosis not present

## 2022-09-23 DIAGNOSIS — L97829 Non-pressure chronic ulcer of other part of left lower leg with unspecified severity: Secondary | ICD-10-CM | POA: Diagnosis not present

## 2022-09-29 DIAGNOSIS — I872 Venous insufficiency (chronic) (peripheral): Secondary | ICD-10-CM | POA: Diagnosis not present

## 2022-09-29 DIAGNOSIS — Z9181 History of falling: Secondary | ICD-10-CM | POA: Diagnosis not present

## 2022-09-29 DIAGNOSIS — J449 Chronic obstructive pulmonary disease, unspecified: Secondary | ICD-10-CM | POA: Diagnosis not present

## 2022-09-29 DIAGNOSIS — I10 Essential (primary) hypertension: Secondary | ICD-10-CM | POA: Diagnosis not present

## 2022-09-29 DIAGNOSIS — Z8616 Personal history of COVID-19: Secondary | ICD-10-CM | POA: Diagnosis not present

## 2022-09-29 DIAGNOSIS — D500 Iron deficiency anemia secondary to blood loss (chronic): Secondary | ICD-10-CM | POA: Diagnosis not present

## 2022-09-29 DIAGNOSIS — K580 Irritable bowel syndrome with diarrhea: Secondary | ICD-10-CM | POA: Diagnosis not present

## 2022-09-29 DIAGNOSIS — Z7982 Long term (current) use of aspirin: Secondary | ICD-10-CM | POA: Diagnosis not present

## 2022-09-29 DIAGNOSIS — R69 Illness, unspecified: Secondary | ICD-10-CM | POA: Diagnosis not present

## 2022-09-29 DIAGNOSIS — M810 Age-related osteoporosis without current pathological fracture: Secondary | ICD-10-CM | POA: Diagnosis not present

## 2022-09-29 DIAGNOSIS — L97322 Non-pressure chronic ulcer of left ankle with fat layer exposed: Secondary | ICD-10-CM | POA: Diagnosis not present

## 2022-09-29 DIAGNOSIS — I890 Lymphedema, not elsewhere classified: Secondary | ICD-10-CM | POA: Diagnosis not present

## 2022-09-29 DIAGNOSIS — I700 Atherosclerosis of aorta: Secondary | ICD-10-CM | POA: Diagnosis not present

## 2022-10-01 DIAGNOSIS — Z9181 History of falling: Secondary | ICD-10-CM | POA: Diagnosis not present

## 2022-10-01 DIAGNOSIS — K580 Irritable bowel syndrome with diarrhea: Secondary | ICD-10-CM | POA: Diagnosis not present

## 2022-10-01 DIAGNOSIS — M810 Age-related osteoporosis without current pathological fracture: Secondary | ICD-10-CM | POA: Diagnosis not present

## 2022-10-01 DIAGNOSIS — I872 Venous insufficiency (chronic) (peripheral): Secondary | ICD-10-CM | POA: Diagnosis not present

## 2022-10-01 DIAGNOSIS — J449 Chronic obstructive pulmonary disease, unspecified: Secondary | ICD-10-CM | POA: Diagnosis not present

## 2022-10-01 DIAGNOSIS — D500 Iron deficiency anemia secondary to blood loss (chronic): Secondary | ICD-10-CM | POA: Diagnosis not present

## 2022-10-01 DIAGNOSIS — E7801 Familial hypercholesterolemia: Secondary | ICD-10-CM | POA: Diagnosis not present

## 2022-10-01 DIAGNOSIS — I10 Essential (primary) hypertension: Secondary | ICD-10-CM | POA: Diagnosis not present

## 2022-10-01 DIAGNOSIS — Z8616 Personal history of COVID-19: Secondary | ICD-10-CM | POA: Diagnosis not present

## 2022-10-01 DIAGNOSIS — I700 Atherosclerosis of aorta: Secondary | ICD-10-CM | POA: Diagnosis not present

## 2022-10-01 DIAGNOSIS — Z1329 Encounter for screening for other suspected endocrine disorder: Secondary | ICD-10-CM | POA: Diagnosis not present

## 2022-10-01 DIAGNOSIS — I890 Lymphedema, not elsewhere classified: Secondary | ICD-10-CM | POA: Diagnosis not present

## 2022-10-01 DIAGNOSIS — K7581 Nonalcoholic steatohepatitis (NASH): Secondary | ICD-10-CM | POA: Diagnosis not present

## 2022-10-01 DIAGNOSIS — E1165 Type 2 diabetes mellitus with hyperglycemia: Secondary | ICD-10-CM | POA: Diagnosis not present

## 2022-10-01 DIAGNOSIS — L97322 Non-pressure chronic ulcer of left ankle with fat layer exposed: Secondary | ICD-10-CM | POA: Diagnosis not present

## 2022-10-01 DIAGNOSIS — D519 Vitamin B12 deficiency anemia, unspecified: Secondary | ICD-10-CM | POA: Diagnosis not present

## 2022-10-01 DIAGNOSIS — Z7982 Long term (current) use of aspirin: Secondary | ICD-10-CM | POA: Diagnosis not present

## 2022-10-01 DIAGNOSIS — D649 Anemia, unspecified: Secondary | ICD-10-CM | POA: Diagnosis not present

## 2022-10-01 DIAGNOSIS — E7800 Pure hypercholesterolemia, unspecified: Secondary | ICD-10-CM | POA: Diagnosis not present

## 2022-10-01 DIAGNOSIS — D529 Folate deficiency anemia, unspecified: Secondary | ICD-10-CM | POA: Diagnosis not present

## 2022-10-01 DIAGNOSIS — Z87891 Personal history of nicotine dependence: Secondary | ICD-10-CM | POA: Diagnosis not present

## 2022-10-01 DIAGNOSIS — R69 Illness, unspecified: Secondary | ICD-10-CM | POA: Diagnosis not present

## 2022-10-05 DIAGNOSIS — J449 Chronic obstructive pulmonary disease, unspecified: Secondary | ICD-10-CM | POA: Diagnosis not present

## 2022-10-05 DIAGNOSIS — K580 Irritable bowel syndrome with diarrhea: Secondary | ICD-10-CM | POA: Diagnosis not present

## 2022-10-05 DIAGNOSIS — R69 Illness, unspecified: Secondary | ICD-10-CM | POA: Diagnosis not present

## 2022-10-05 DIAGNOSIS — I10 Essential (primary) hypertension: Secondary | ICD-10-CM | POA: Diagnosis not present

## 2022-10-05 DIAGNOSIS — D500 Iron deficiency anemia secondary to blood loss (chronic): Secondary | ICD-10-CM | POA: Diagnosis not present

## 2022-10-05 DIAGNOSIS — Z7982 Long term (current) use of aspirin: Secondary | ICD-10-CM | POA: Diagnosis not present

## 2022-10-05 DIAGNOSIS — I872 Venous insufficiency (chronic) (peripheral): Secondary | ICD-10-CM | POA: Diagnosis not present

## 2022-10-05 DIAGNOSIS — Z8616 Personal history of COVID-19: Secondary | ICD-10-CM | POA: Diagnosis not present

## 2022-10-05 DIAGNOSIS — L97322 Non-pressure chronic ulcer of left ankle with fat layer exposed: Secondary | ICD-10-CM | POA: Diagnosis not present

## 2022-10-05 DIAGNOSIS — I890 Lymphedema, not elsewhere classified: Secondary | ICD-10-CM | POA: Diagnosis not present

## 2022-10-05 DIAGNOSIS — M810 Age-related osteoporosis without current pathological fracture: Secondary | ICD-10-CM | POA: Diagnosis not present

## 2022-10-05 DIAGNOSIS — Z9181 History of falling: Secondary | ICD-10-CM | POA: Diagnosis not present

## 2022-10-05 DIAGNOSIS — I700 Atherosclerosis of aorta: Secondary | ICD-10-CM | POA: Diagnosis not present

## 2022-10-06 DIAGNOSIS — I872 Venous insufficiency (chronic) (peripheral): Secondary | ICD-10-CM | POA: Diagnosis not present

## 2022-10-06 DIAGNOSIS — E440 Moderate protein-calorie malnutrition: Secondary | ICD-10-CM | POA: Diagnosis not present

## 2022-10-06 DIAGNOSIS — Z23 Encounter for immunization: Secondary | ICD-10-CM | POA: Diagnosis not present

## 2022-10-06 DIAGNOSIS — M25511 Pain in right shoulder: Secondary | ICD-10-CM | POA: Diagnosis not present

## 2022-10-06 DIAGNOSIS — K7581 Nonalcoholic steatohepatitis (NASH): Secondary | ICD-10-CM | POA: Diagnosis not present

## 2022-10-06 DIAGNOSIS — R4582 Worries: Secondary | ICD-10-CM | POA: Diagnosis not present

## 2022-10-06 DIAGNOSIS — D692 Other nonthrombocytopenic purpura: Secondary | ICD-10-CM | POA: Diagnosis not present

## 2022-10-06 DIAGNOSIS — E7849 Other hyperlipidemia: Secondary | ICD-10-CM | POA: Diagnosis not present

## 2022-10-06 DIAGNOSIS — I10 Essential (primary) hypertension: Secondary | ICD-10-CM | POA: Diagnosis not present

## 2022-10-06 DIAGNOSIS — K580 Irritable bowel syndrome with diarrhea: Secondary | ICD-10-CM | POA: Diagnosis not present

## 2022-10-06 DIAGNOSIS — Z0001 Encounter for general adult medical examination with abnormal findings: Secondary | ICD-10-CM | POA: Diagnosis not present

## 2022-10-06 DIAGNOSIS — D500 Iron deficiency anemia secondary to blood loss (chronic): Secondary | ICD-10-CM | POA: Diagnosis not present

## 2022-10-06 DIAGNOSIS — R69 Illness, unspecified: Secondary | ICD-10-CM | POA: Diagnosis not present

## 2022-10-07 DIAGNOSIS — I872 Venous insufficiency (chronic) (peripheral): Secondary | ICD-10-CM | POA: Diagnosis not present

## 2022-10-07 DIAGNOSIS — I700 Atherosclerosis of aorta: Secondary | ICD-10-CM | POA: Diagnosis not present

## 2022-10-07 DIAGNOSIS — I10 Essential (primary) hypertension: Secondary | ICD-10-CM | POA: Diagnosis not present

## 2022-10-07 DIAGNOSIS — K580 Irritable bowel syndrome with diarrhea: Secondary | ICD-10-CM | POA: Diagnosis not present

## 2022-10-07 DIAGNOSIS — Z9181 History of falling: Secondary | ICD-10-CM | POA: Diagnosis not present

## 2022-10-07 DIAGNOSIS — J449 Chronic obstructive pulmonary disease, unspecified: Secondary | ICD-10-CM | POA: Diagnosis not present

## 2022-10-07 DIAGNOSIS — Z8616 Personal history of COVID-19: Secondary | ICD-10-CM | POA: Diagnosis not present

## 2022-10-07 DIAGNOSIS — D500 Iron deficiency anemia secondary to blood loss (chronic): Secondary | ICD-10-CM | POA: Diagnosis not present

## 2022-10-07 DIAGNOSIS — Z7982 Long term (current) use of aspirin: Secondary | ICD-10-CM | POA: Diagnosis not present

## 2022-10-07 DIAGNOSIS — M810 Age-related osteoporosis without current pathological fracture: Secondary | ICD-10-CM | POA: Diagnosis not present

## 2022-10-07 DIAGNOSIS — I890 Lymphedema, not elsewhere classified: Secondary | ICD-10-CM | POA: Diagnosis not present

## 2022-10-07 DIAGNOSIS — R69 Illness, unspecified: Secondary | ICD-10-CM | POA: Diagnosis not present

## 2022-10-07 DIAGNOSIS — L97322 Non-pressure chronic ulcer of left ankle with fat layer exposed: Secondary | ICD-10-CM | POA: Diagnosis not present

## 2022-10-13 DIAGNOSIS — Z23 Encounter for immunization: Secondary | ICD-10-CM | POA: Diagnosis not present

## 2022-10-14 DIAGNOSIS — I872 Venous insufficiency (chronic) (peripheral): Secondary | ICD-10-CM | POA: Diagnosis not present

## 2022-10-14 DIAGNOSIS — Z8616 Personal history of COVID-19: Secondary | ICD-10-CM | POA: Diagnosis not present

## 2022-10-14 DIAGNOSIS — Z9181 History of falling: Secondary | ICD-10-CM | POA: Diagnosis not present

## 2022-10-14 DIAGNOSIS — I700 Atherosclerosis of aorta: Secondary | ICD-10-CM | POA: Diagnosis not present

## 2022-10-14 DIAGNOSIS — D500 Iron deficiency anemia secondary to blood loss (chronic): Secondary | ICD-10-CM | POA: Diagnosis not present

## 2022-10-14 DIAGNOSIS — I890 Lymphedema, not elsewhere classified: Secondary | ICD-10-CM | POA: Diagnosis not present

## 2022-10-14 DIAGNOSIS — L97322 Non-pressure chronic ulcer of left ankle with fat layer exposed: Secondary | ICD-10-CM | POA: Diagnosis not present

## 2022-10-14 DIAGNOSIS — J449 Chronic obstructive pulmonary disease, unspecified: Secondary | ICD-10-CM | POA: Diagnosis not present

## 2022-10-14 DIAGNOSIS — I10 Essential (primary) hypertension: Secondary | ICD-10-CM | POA: Diagnosis not present

## 2022-10-14 DIAGNOSIS — M810 Age-related osteoporosis without current pathological fracture: Secondary | ICD-10-CM | POA: Diagnosis not present

## 2022-10-14 DIAGNOSIS — R69 Illness, unspecified: Secondary | ICD-10-CM | POA: Diagnosis not present

## 2022-10-14 DIAGNOSIS — Z7982 Long term (current) use of aspirin: Secondary | ICD-10-CM | POA: Diagnosis not present

## 2022-10-14 DIAGNOSIS — K580 Irritable bowel syndrome with diarrhea: Secondary | ICD-10-CM | POA: Diagnosis not present

## 2022-10-16 DIAGNOSIS — D500 Iron deficiency anemia secondary to blood loss (chronic): Secondary | ICD-10-CM | POA: Diagnosis not present

## 2022-10-16 DIAGNOSIS — Z7982 Long term (current) use of aspirin: Secondary | ICD-10-CM | POA: Diagnosis not present

## 2022-10-16 DIAGNOSIS — Z8616 Personal history of COVID-19: Secondary | ICD-10-CM | POA: Diagnosis not present

## 2022-10-16 DIAGNOSIS — I700 Atherosclerosis of aorta: Secondary | ICD-10-CM | POA: Diagnosis not present

## 2022-10-16 DIAGNOSIS — R69 Illness, unspecified: Secondary | ICD-10-CM | POA: Diagnosis not present

## 2022-10-16 DIAGNOSIS — I10 Essential (primary) hypertension: Secondary | ICD-10-CM | POA: Diagnosis not present

## 2022-10-16 DIAGNOSIS — Z9181 History of falling: Secondary | ICD-10-CM | POA: Diagnosis not present

## 2022-10-16 DIAGNOSIS — J449 Chronic obstructive pulmonary disease, unspecified: Secondary | ICD-10-CM | POA: Diagnosis not present

## 2022-10-16 DIAGNOSIS — M810 Age-related osteoporosis without current pathological fracture: Secondary | ICD-10-CM | POA: Diagnosis not present

## 2022-10-16 DIAGNOSIS — L97322 Non-pressure chronic ulcer of left ankle with fat layer exposed: Secondary | ICD-10-CM | POA: Diagnosis not present

## 2022-10-16 DIAGNOSIS — I890 Lymphedema, not elsewhere classified: Secondary | ICD-10-CM | POA: Diagnosis not present

## 2022-10-16 DIAGNOSIS — I872 Venous insufficiency (chronic) (peripheral): Secondary | ICD-10-CM | POA: Diagnosis not present

## 2022-10-16 DIAGNOSIS — K580 Irritable bowel syndrome with diarrhea: Secondary | ICD-10-CM | POA: Diagnosis not present

## 2022-10-19 DIAGNOSIS — L97322 Non-pressure chronic ulcer of left ankle with fat layer exposed: Secondary | ICD-10-CM | POA: Diagnosis not present

## 2022-10-19 DIAGNOSIS — I10 Essential (primary) hypertension: Secondary | ICD-10-CM | POA: Diagnosis not present

## 2022-10-19 DIAGNOSIS — Z8616 Personal history of COVID-19: Secondary | ICD-10-CM | POA: Diagnosis not present

## 2022-10-19 DIAGNOSIS — I700 Atherosclerosis of aorta: Secondary | ICD-10-CM | POA: Diagnosis not present

## 2022-10-19 DIAGNOSIS — R69 Illness, unspecified: Secondary | ICD-10-CM | POA: Diagnosis not present

## 2022-10-19 DIAGNOSIS — I890 Lymphedema, not elsewhere classified: Secondary | ICD-10-CM | POA: Diagnosis not present

## 2022-10-19 DIAGNOSIS — J449 Chronic obstructive pulmonary disease, unspecified: Secondary | ICD-10-CM | POA: Diagnosis not present

## 2022-10-19 DIAGNOSIS — Z9181 History of falling: Secondary | ICD-10-CM | POA: Diagnosis not present

## 2022-10-19 DIAGNOSIS — K580 Irritable bowel syndrome with diarrhea: Secondary | ICD-10-CM | POA: Diagnosis not present

## 2022-10-19 DIAGNOSIS — D500 Iron deficiency anemia secondary to blood loss (chronic): Secondary | ICD-10-CM | POA: Diagnosis not present

## 2022-10-19 DIAGNOSIS — I872 Venous insufficiency (chronic) (peripheral): Secondary | ICD-10-CM | POA: Diagnosis not present

## 2022-10-19 DIAGNOSIS — M810 Age-related osteoporosis without current pathological fracture: Secondary | ICD-10-CM | POA: Diagnosis not present

## 2022-10-19 DIAGNOSIS — Z7982 Long term (current) use of aspirin: Secondary | ICD-10-CM | POA: Diagnosis not present

## 2022-10-28 DIAGNOSIS — R600 Localized edema: Secondary | ICD-10-CM | POA: Diagnosis not present

## 2022-10-28 DIAGNOSIS — L97919 Non-pressure chronic ulcer of unspecified part of right lower leg with unspecified severity: Secondary | ICD-10-CM | POA: Diagnosis not present

## 2022-10-28 DIAGNOSIS — I70203 Unspecified atherosclerosis of native arteries of extremities, bilateral legs: Secondary | ICD-10-CM | POA: Diagnosis not present

## 2022-10-28 DIAGNOSIS — I83029 Varicose veins of left lower extremity with ulcer of unspecified site: Secondary | ICD-10-CM | POA: Diagnosis not present

## 2022-10-28 DIAGNOSIS — I872 Venous insufficiency (chronic) (peripheral): Secondary | ICD-10-CM | POA: Diagnosis not present

## 2022-10-28 DIAGNOSIS — Z87891 Personal history of nicotine dependence: Secondary | ICD-10-CM | POA: Diagnosis not present

## 2022-10-28 DIAGNOSIS — L280 Lichen simplex chronicus: Secondary | ICD-10-CM | POA: Diagnosis not present

## 2022-10-28 DIAGNOSIS — I10 Essential (primary) hypertension: Secondary | ICD-10-CM | POA: Diagnosis not present

## 2022-10-28 DIAGNOSIS — I890 Lymphedema, not elsewhere classified: Secondary | ICD-10-CM | POA: Diagnosis not present

## 2022-10-28 DIAGNOSIS — I83019 Varicose veins of right lower extremity with ulcer of unspecified site: Secondary | ICD-10-CM | POA: Diagnosis not present

## 2022-10-28 DIAGNOSIS — M810 Age-related osteoporosis without current pathological fracture: Secondary | ICD-10-CM | POA: Diagnosis not present

## 2022-10-28 DIAGNOSIS — Z79899 Other long term (current) drug therapy: Secondary | ICD-10-CM | POA: Diagnosis not present

## 2022-10-28 DIAGNOSIS — L97222 Non-pressure chronic ulcer of left calf with fat layer exposed: Secondary | ICD-10-CM | POA: Diagnosis not present

## 2022-10-29 DIAGNOSIS — Z8616 Personal history of COVID-19: Secondary | ICD-10-CM | POA: Diagnosis not present

## 2022-10-29 DIAGNOSIS — M810 Age-related osteoporosis without current pathological fracture: Secondary | ICD-10-CM | POA: Diagnosis not present

## 2022-10-29 DIAGNOSIS — J449 Chronic obstructive pulmonary disease, unspecified: Secondary | ICD-10-CM | POA: Diagnosis not present

## 2022-10-29 DIAGNOSIS — I700 Atherosclerosis of aorta: Secondary | ICD-10-CM | POA: Diagnosis not present

## 2022-10-29 DIAGNOSIS — R69 Illness, unspecified: Secondary | ICD-10-CM | POA: Diagnosis not present

## 2022-10-29 DIAGNOSIS — K580 Irritable bowel syndrome with diarrhea: Secondary | ICD-10-CM | POA: Diagnosis not present

## 2022-10-29 DIAGNOSIS — Z7982 Long term (current) use of aspirin: Secondary | ICD-10-CM | POA: Diagnosis not present

## 2022-10-29 DIAGNOSIS — Z9181 History of falling: Secondary | ICD-10-CM | POA: Diagnosis not present

## 2022-10-29 DIAGNOSIS — D500 Iron deficiency anemia secondary to blood loss (chronic): Secondary | ICD-10-CM | POA: Diagnosis not present

## 2022-10-29 DIAGNOSIS — I10 Essential (primary) hypertension: Secondary | ICD-10-CM | POA: Diagnosis not present

## 2022-10-29 DIAGNOSIS — I872 Venous insufficiency (chronic) (peripheral): Secondary | ICD-10-CM | POA: Diagnosis not present

## 2022-10-29 DIAGNOSIS — L97322 Non-pressure chronic ulcer of left ankle with fat layer exposed: Secondary | ICD-10-CM | POA: Diagnosis not present

## 2022-10-29 DIAGNOSIS — I890 Lymphedema, not elsewhere classified: Secondary | ICD-10-CM | POA: Diagnosis not present

## 2022-11-03 DIAGNOSIS — L97322 Non-pressure chronic ulcer of left ankle with fat layer exposed: Secondary | ICD-10-CM | POA: Diagnosis not present

## 2022-11-03 DIAGNOSIS — M810 Age-related osteoporosis without current pathological fracture: Secondary | ICD-10-CM | POA: Diagnosis not present

## 2022-11-03 DIAGNOSIS — I10 Essential (primary) hypertension: Secondary | ICD-10-CM | POA: Diagnosis not present

## 2022-11-03 DIAGNOSIS — I890 Lymphedema, not elsewhere classified: Secondary | ICD-10-CM | POA: Diagnosis not present

## 2022-11-03 DIAGNOSIS — I872 Venous insufficiency (chronic) (peripheral): Secondary | ICD-10-CM | POA: Diagnosis not present

## 2022-11-03 DIAGNOSIS — D500 Iron deficiency anemia secondary to blood loss (chronic): Secondary | ICD-10-CM | POA: Diagnosis not present

## 2022-11-03 DIAGNOSIS — Z7982 Long term (current) use of aspirin: Secondary | ICD-10-CM | POA: Diagnosis not present

## 2022-11-03 DIAGNOSIS — Z9181 History of falling: Secondary | ICD-10-CM | POA: Diagnosis not present

## 2022-11-03 DIAGNOSIS — R69 Illness, unspecified: Secondary | ICD-10-CM | POA: Diagnosis not present

## 2022-11-03 DIAGNOSIS — K580 Irritable bowel syndrome with diarrhea: Secondary | ICD-10-CM | POA: Diagnosis not present

## 2022-11-03 DIAGNOSIS — J449 Chronic obstructive pulmonary disease, unspecified: Secondary | ICD-10-CM | POA: Diagnosis not present

## 2022-11-03 DIAGNOSIS — Z8616 Personal history of COVID-19: Secondary | ICD-10-CM | POA: Diagnosis not present

## 2022-11-03 DIAGNOSIS — I700 Atherosclerosis of aorta: Secondary | ICD-10-CM | POA: Diagnosis not present

## 2022-11-11 DIAGNOSIS — Z79899 Other long term (current) drug therapy: Secondary | ICD-10-CM | POA: Diagnosis not present

## 2022-11-11 DIAGNOSIS — I70203 Unspecified atherosclerosis of native arteries of extremities, bilateral legs: Secondary | ICD-10-CM | POA: Diagnosis not present

## 2022-11-11 DIAGNOSIS — I10 Essential (primary) hypertension: Secondary | ICD-10-CM | POA: Diagnosis not present

## 2022-11-11 DIAGNOSIS — I890 Lymphedema, not elsewhere classified: Secondary | ICD-10-CM | POA: Diagnosis not present

## 2022-11-11 DIAGNOSIS — L97222 Non-pressure chronic ulcer of left calf with fat layer exposed: Secondary | ICD-10-CM | POA: Diagnosis not present

## 2022-11-11 DIAGNOSIS — Z87891 Personal history of nicotine dependence: Secondary | ICD-10-CM | POA: Diagnosis not present

## 2022-11-11 DIAGNOSIS — I83019 Varicose veins of right lower extremity with ulcer of unspecified site: Secondary | ICD-10-CM | POA: Diagnosis not present

## 2022-11-11 DIAGNOSIS — I872 Venous insufficiency (chronic) (peripheral): Secondary | ICD-10-CM | POA: Diagnosis not present

## 2022-11-11 DIAGNOSIS — L97919 Non-pressure chronic ulcer of unspecified part of right lower leg with unspecified severity: Secondary | ICD-10-CM | POA: Diagnosis not present

## 2022-11-11 DIAGNOSIS — I83029 Varicose veins of left lower extremity with ulcer of unspecified site: Secondary | ICD-10-CM | POA: Diagnosis not present

## 2022-11-11 DIAGNOSIS — R600 Localized edema: Secondary | ICD-10-CM | POA: Diagnosis not present

## 2022-11-11 DIAGNOSIS — M810 Age-related osteoporosis without current pathological fracture: Secondary | ICD-10-CM | POA: Diagnosis not present

## 2022-11-12 DIAGNOSIS — J449 Chronic obstructive pulmonary disease, unspecified: Secondary | ICD-10-CM | POA: Diagnosis not present

## 2022-11-12 DIAGNOSIS — I890 Lymphedema, not elsewhere classified: Secondary | ICD-10-CM | POA: Diagnosis not present

## 2022-11-12 DIAGNOSIS — M810 Age-related osteoporosis without current pathological fracture: Secondary | ICD-10-CM | POA: Diagnosis not present

## 2022-11-12 DIAGNOSIS — K580 Irritable bowel syndrome with diarrhea: Secondary | ICD-10-CM | POA: Diagnosis not present

## 2022-11-12 DIAGNOSIS — D500 Iron deficiency anemia secondary to blood loss (chronic): Secondary | ICD-10-CM | POA: Diagnosis not present

## 2022-11-12 DIAGNOSIS — L97322 Non-pressure chronic ulcer of left ankle with fat layer exposed: Secondary | ICD-10-CM | POA: Diagnosis not present

## 2022-11-12 DIAGNOSIS — I10 Essential (primary) hypertension: Secondary | ICD-10-CM | POA: Diagnosis not present

## 2022-11-12 DIAGNOSIS — Z9181 History of falling: Secondary | ICD-10-CM | POA: Diagnosis not present

## 2022-11-12 DIAGNOSIS — R69 Illness, unspecified: Secondary | ICD-10-CM | POA: Diagnosis not present

## 2022-11-12 DIAGNOSIS — I700 Atherosclerosis of aorta: Secondary | ICD-10-CM | POA: Diagnosis not present

## 2022-11-12 DIAGNOSIS — Z7982 Long term (current) use of aspirin: Secondary | ICD-10-CM | POA: Diagnosis not present

## 2022-11-12 DIAGNOSIS — I872 Venous insufficiency (chronic) (peripheral): Secondary | ICD-10-CM | POA: Diagnosis not present

## 2022-11-12 DIAGNOSIS — Z8616 Personal history of COVID-19: Secondary | ICD-10-CM | POA: Diagnosis not present

## 2022-11-17 DIAGNOSIS — I10 Essential (primary) hypertension: Secondary | ICD-10-CM | POA: Diagnosis not present

## 2022-11-17 DIAGNOSIS — K580 Irritable bowel syndrome with diarrhea: Secondary | ICD-10-CM | POA: Diagnosis not present

## 2022-11-17 DIAGNOSIS — M810 Age-related osteoporosis without current pathological fracture: Secondary | ICD-10-CM | POA: Diagnosis not present

## 2022-11-17 DIAGNOSIS — L97322 Non-pressure chronic ulcer of left ankle with fat layer exposed: Secondary | ICD-10-CM | POA: Diagnosis not present

## 2022-11-17 DIAGNOSIS — Z7982 Long term (current) use of aspirin: Secondary | ICD-10-CM | POA: Diagnosis not present

## 2022-11-17 DIAGNOSIS — I700 Atherosclerosis of aorta: Secondary | ICD-10-CM | POA: Diagnosis not present

## 2022-11-17 DIAGNOSIS — Z9181 History of falling: Secondary | ICD-10-CM | POA: Diagnosis not present

## 2022-11-17 DIAGNOSIS — I890 Lymphedema, not elsewhere classified: Secondary | ICD-10-CM | POA: Diagnosis not present

## 2022-11-17 DIAGNOSIS — R69 Illness, unspecified: Secondary | ICD-10-CM | POA: Diagnosis not present

## 2022-11-17 DIAGNOSIS — D500 Iron deficiency anemia secondary to blood loss (chronic): Secondary | ICD-10-CM | POA: Diagnosis not present

## 2022-11-17 DIAGNOSIS — I872 Venous insufficiency (chronic) (peripheral): Secondary | ICD-10-CM | POA: Diagnosis not present

## 2022-11-17 DIAGNOSIS — J449 Chronic obstructive pulmonary disease, unspecified: Secondary | ICD-10-CM | POA: Diagnosis not present

## 2022-11-17 DIAGNOSIS — Z8616 Personal history of COVID-19: Secondary | ICD-10-CM | POA: Diagnosis not present

## 2022-11-24 DIAGNOSIS — L97322 Non-pressure chronic ulcer of left ankle with fat layer exposed: Secondary | ICD-10-CM | POA: Diagnosis not present

## 2022-11-24 DIAGNOSIS — Z7982 Long term (current) use of aspirin: Secondary | ICD-10-CM | POA: Diagnosis not present

## 2022-11-24 DIAGNOSIS — Z8616 Personal history of COVID-19: Secondary | ICD-10-CM | POA: Diagnosis not present

## 2022-11-24 DIAGNOSIS — I872 Venous insufficiency (chronic) (peripheral): Secondary | ICD-10-CM | POA: Diagnosis not present

## 2022-11-24 DIAGNOSIS — D500 Iron deficiency anemia secondary to blood loss (chronic): Secondary | ICD-10-CM | POA: Diagnosis not present

## 2022-11-24 DIAGNOSIS — K580 Irritable bowel syndrome with diarrhea: Secondary | ICD-10-CM | POA: Diagnosis not present

## 2022-11-24 DIAGNOSIS — I10 Essential (primary) hypertension: Secondary | ICD-10-CM | POA: Diagnosis not present

## 2022-11-24 DIAGNOSIS — R69 Illness, unspecified: Secondary | ICD-10-CM | POA: Diagnosis not present

## 2022-11-24 DIAGNOSIS — I700 Atherosclerosis of aorta: Secondary | ICD-10-CM | POA: Diagnosis not present

## 2022-11-24 DIAGNOSIS — Z9181 History of falling: Secondary | ICD-10-CM | POA: Diagnosis not present

## 2022-11-24 DIAGNOSIS — I890 Lymphedema, not elsewhere classified: Secondary | ICD-10-CM | POA: Diagnosis not present

## 2022-11-24 DIAGNOSIS — J449 Chronic obstructive pulmonary disease, unspecified: Secondary | ICD-10-CM | POA: Diagnosis not present

## 2022-11-24 DIAGNOSIS — M810 Age-related osteoporosis without current pathological fracture: Secondary | ICD-10-CM | POA: Diagnosis not present

## 2022-12-01 DIAGNOSIS — I10 Essential (primary) hypertension: Secondary | ICD-10-CM | POA: Diagnosis not present

## 2022-12-01 DIAGNOSIS — J449 Chronic obstructive pulmonary disease, unspecified: Secondary | ICD-10-CM | POA: Diagnosis not present

## 2022-12-01 DIAGNOSIS — I700 Atherosclerosis of aorta: Secondary | ICD-10-CM | POA: Diagnosis not present

## 2022-12-01 DIAGNOSIS — L97322 Non-pressure chronic ulcer of left ankle with fat layer exposed: Secondary | ICD-10-CM | POA: Diagnosis not present

## 2022-12-01 DIAGNOSIS — Z9181 History of falling: Secondary | ICD-10-CM | POA: Diagnosis not present

## 2022-12-01 DIAGNOSIS — Z7982 Long term (current) use of aspirin: Secondary | ICD-10-CM | POA: Diagnosis not present

## 2022-12-01 DIAGNOSIS — Z8616 Personal history of COVID-19: Secondary | ICD-10-CM | POA: Diagnosis not present

## 2022-12-01 DIAGNOSIS — M810 Age-related osteoporosis without current pathological fracture: Secondary | ICD-10-CM | POA: Diagnosis not present

## 2022-12-01 DIAGNOSIS — K580 Irritable bowel syndrome with diarrhea: Secondary | ICD-10-CM | POA: Diagnosis not present

## 2022-12-01 DIAGNOSIS — I872 Venous insufficiency (chronic) (peripheral): Secondary | ICD-10-CM | POA: Diagnosis not present

## 2022-12-01 DIAGNOSIS — D500 Iron deficiency anemia secondary to blood loss (chronic): Secondary | ICD-10-CM | POA: Diagnosis not present

## 2022-12-01 DIAGNOSIS — I890 Lymphedema, not elsewhere classified: Secondary | ICD-10-CM | POA: Diagnosis not present

## 2022-12-01 DIAGNOSIS — R69 Illness, unspecified: Secondary | ICD-10-CM | POA: Diagnosis not present

## 2022-12-07 DIAGNOSIS — M79674 Pain in right toe(s): Secondary | ICD-10-CM | POA: Diagnosis not present

## 2022-12-07 DIAGNOSIS — B351 Tinea unguium: Secondary | ICD-10-CM | POA: Diagnosis not present

## 2022-12-07 DIAGNOSIS — I739 Peripheral vascular disease, unspecified: Secondary | ICD-10-CM | POA: Diagnosis not present

## 2022-12-07 DIAGNOSIS — L851 Acquired keratosis [keratoderma] palmaris et plantaris: Secondary | ICD-10-CM | POA: Diagnosis not present

## 2022-12-07 DIAGNOSIS — M79675 Pain in left toe(s): Secondary | ICD-10-CM | POA: Diagnosis not present

## 2022-12-08 DIAGNOSIS — L97322 Non-pressure chronic ulcer of left ankle with fat layer exposed: Secondary | ICD-10-CM | POA: Diagnosis not present

## 2022-12-08 DIAGNOSIS — I700 Atherosclerosis of aorta: Secondary | ICD-10-CM | POA: Diagnosis not present

## 2022-12-08 DIAGNOSIS — J449 Chronic obstructive pulmonary disease, unspecified: Secondary | ICD-10-CM | POA: Diagnosis not present

## 2022-12-08 DIAGNOSIS — I872 Venous insufficiency (chronic) (peripheral): Secondary | ICD-10-CM | POA: Diagnosis not present

## 2022-12-08 DIAGNOSIS — Z7982 Long term (current) use of aspirin: Secondary | ICD-10-CM | POA: Diagnosis not present

## 2022-12-08 DIAGNOSIS — Z9181 History of falling: Secondary | ICD-10-CM | POA: Diagnosis not present

## 2022-12-08 DIAGNOSIS — I890 Lymphedema, not elsewhere classified: Secondary | ICD-10-CM | POA: Diagnosis not present

## 2022-12-08 DIAGNOSIS — R69 Illness, unspecified: Secondary | ICD-10-CM | POA: Diagnosis not present

## 2022-12-08 DIAGNOSIS — K580 Irritable bowel syndrome with diarrhea: Secondary | ICD-10-CM | POA: Diagnosis not present

## 2022-12-08 DIAGNOSIS — Z8616 Personal history of COVID-19: Secondary | ICD-10-CM | POA: Diagnosis not present

## 2022-12-08 DIAGNOSIS — I10 Essential (primary) hypertension: Secondary | ICD-10-CM | POA: Diagnosis not present

## 2022-12-08 DIAGNOSIS — M810 Age-related osteoporosis without current pathological fracture: Secondary | ICD-10-CM | POA: Diagnosis not present

## 2022-12-08 DIAGNOSIS — D500 Iron deficiency anemia secondary to blood loss (chronic): Secondary | ICD-10-CM | POA: Diagnosis not present

## 2022-12-15 DIAGNOSIS — I890 Lymphedema, not elsewhere classified: Secondary | ICD-10-CM | POA: Diagnosis not present

## 2022-12-15 DIAGNOSIS — L97322 Non-pressure chronic ulcer of left ankle with fat layer exposed: Secondary | ICD-10-CM | POA: Diagnosis not present

## 2022-12-15 DIAGNOSIS — J449 Chronic obstructive pulmonary disease, unspecified: Secondary | ICD-10-CM | POA: Diagnosis not present

## 2022-12-15 DIAGNOSIS — I10 Essential (primary) hypertension: Secondary | ICD-10-CM | POA: Diagnosis not present

## 2022-12-15 DIAGNOSIS — Z7982 Long term (current) use of aspirin: Secondary | ICD-10-CM | POA: Diagnosis not present

## 2022-12-15 DIAGNOSIS — I872 Venous insufficiency (chronic) (peripheral): Secondary | ICD-10-CM | POA: Diagnosis not present

## 2022-12-15 DIAGNOSIS — D500 Iron deficiency anemia secondary to blood loss (chronic): Secondary | ICD-10-CM | POA: Diagnosis not present

## 2022-12-15 DIAGNOSIS — M810 Age-related osteoporosis without current pathological fracture: Secondary | ICD-10-CM | POA: Diagnosis not present

## 2022-12-15 DIAGNOSIS — K580 Irritable bowel syndrome with diarrhea: Secondary | ICD-10-CM | POA: Diagnosis not present

## 2022-12-15 DIAGNOSIS — R69 Illness, unspecified: Secondary | ICD-10-CM | POA: Diagnosis not present

## 2022-12-15 DIAGNOSIS — Z8616 Personal history of COVID-19: Secondary | ICD-10-CM | POA: Diagnosis not present

## 2022-12-15 DIAGNOSIS — I700 Atherosclerosis of aorta: Secondary | ICD-10-CM | POA: Diagnosis not present

## 2022-12-15 DIAGNOSIS — Z9181 History of falling: Secondary | ICD-10-CM | POA: Diagnosis not present

## 2022-12-22 DIAGNOSIS — E7801 Familial hypercholesterolemia: Secondary | ICD-10-CM | POA: Diagnosis not present

## 2022-12-22 DIAGNOSIS — R69 Illness, unspecified: Secondary | ICD-10-CM | POA: Diagnosis not present

## 2022-12-22 DIAGNOSIS — I890 Lymphedema, not elsewhere classified: Secondary | ICD-10-CM | POA: Diagnosis not present

## 2022-12-22 DIAGNOSIS — K580 Irritable bowel syndrome with diarrhea: Secondary | ICD-10-CM | POA: Diagnosis not present

## 2022-12-22 DIAGNOSIS — J449 Chronic obstructive pulmonary disease, unspecified: Secondary | ICD-10-CM | POA: Diagnosis not present

## 2022-12-22 DIAGNOSIS — Z1329 Encounter for screening for other suspected endocrine disorder: Secondary | ICD-10-CM | POA: Diagnosis not present

## 2022-12-22 DIAGNOSIS — M810 Age-related osteoporosis without current pathological fracture: Secondary | ICD-10-CM | POA: Diagnosis not present

## 2022-12-22 DIAGNOSIS — I872 Venous insufficiency (chronic) (peripheral): Secondary | ICD-10-CM | POA: Diagnosis not present

## 2022-12-22 DIAGNOSIS — Z7982 Long term (current) use of aspirin: Secondary | ICD-10-CM | POA: Diagnosis not present

## 2022-12-22 DIAGNOSIS — L97322 Non-pressure chronic ulcer of left ankle with fat layer exposed: Secondary | ICD-10-CM | POA: Diagnosis not present

## 2022-12-22 DIAGNOSIS — Z8616 Personal history of COVID-19: Secondary | ICD-10-CM | POA: Diagnosis not present

## 2022-12-22 DIAGNOSIS — D500 Iron deficiency anemia secondary to blood loss (chronic): Secondary | ICD-10-CM | POA: Diagnosis not present

## 2022-12-22 DIAGNOSIS — N1831 Chronic kidney disease, stage 3a: Secondary | ICD-10-CM | POA: Diagnosis not present

## 2022-12-22 DIAGNOSIS — E1165 Type 2 diabetes mellitus with hyperglycemia: Secondary | ICD-10-CM | POA: Diagnosis not present

## 2022-12-22 DIAGNOSIS — I10 Essential (primary) hypertension: Secondary | ICD-10-CM | POA: Diagnosis not present

## 2022-12-22 DIAGNOSIS — Z9181 History of falling: Secondary | ICD-10-CM | POA: Diagnosis not present

## 2022-12-22 DIAGNOSIS — I700 Atherosclerosis of aorta: Secondary | ICD-10-CM | POA: Diagnosis not present

## 2022-12-22 DIAGNOSIS — K7581 Nonalcoholic steatohepatitis (NASH): Secondary | ICD-10-CM | POA: Diagnosis not present

## 2022-12-22 DIAGNOSIS — R932 Abnormal findings on diagnostic imaging of liver and biliary tract: Secondary | ICD-10-CM | POA: Diagnosis not present

## 2022-12-22 DIAGNOSIS — E7849 Other hyperlipidemia: Secondary | ICD-10-CM | POA: Diagnosis not present

## 2022-12-23 DIAGNOSIS — K4090 Unilateral inguinal hernia, without obstruction or gangrene, not specified as recurrent: Secondary | ICD-10-CM | POA: Diagnosis not present

## 2022-12-23 DIAGNOSIS — I701 Atherosclerosis of renal artery: Secondary | ICD-10-CM | POA: Diagnosis not present

## 2022-12-23 DIAGNOSIS — Z7982 Long term (current) use of aspirin: Secondary | ICD-10-CM | POA: Diagnosis not present

## 2022-12-23 DIAGNOSIS — L97922 Non-pressure chronic ulcer of unspecified part of left lower leg with fat layer exposed: Secondary | ICD-10-CM | POA: Diagnosis not present

## 2022-12-23 DIAGNOSIS — I70203 Unspecified atherosclerosis of native arteries of extremities, bilateral legs: Secondary | ICD-10-CM | POA: Diagnosis not present

## 2022-12-23 DIAGNOSIS — L97321 Non-pressure chronic ulcer of left ankle limited to breakdown of skin: Secondary | ICD-10-CM | POA: Diagnosis not present

## 2022-12-23 DIAGNOSIS — M4316 Spondylolisthesis, lumbar region: Secondary | ICD-10-CM | POA: Diagnosis not present

## 2022-12-23 DIAGNOSIS — I872 Venous insufficiency (chronic) (peripheral): Secondary | ICD-10-CM | POA: Diagnosis not present

## 2022-12-23 DIAGNOSIS — J449 Chronic obstructive pulmonary disease, unspecified: Secondary | ICD-10-CM | POA: Diagnosis not present

## 2022-12-23 DIAGNOSIS — I10 Essential (primary) hypertension: Secondary | ICD-10-CM | POA: Diagnosis not present

## 2022-12-23 DIAGNOSIS — S91101A Unspecified open wound of right great toe without damage to nail, initial encounter: Secondary | ICD-10-CM | POA: Diagnosis not present

## 2022-12-23 DIAGNOSIS — I8392 Asymptomatic varicose veins of left lower extremity: Secondary | ICD-10-CM | POA: Diagnosis not present

## 2022-12-23 DIAGNOSIS — M5136 Other intervertebral disc degeneration, lumbar region: Secondary | ICD-10-CM | POA: Diagnosis not present

## 2022-12-23 DIAGNOSIS — I771 Stricture of artery: Secondary | ICD-10-CM | POA: Diagnosis not present

## 2022-12-23 DIAGNOSIS — I700 Atherosclerosis of aorta: Secondary | ICD-10-CM | POA: Diagnosis not present

## 2022-12-23 DIAGNOSIS — Z87891 Personal history of nicotine dependence: Secondary | ICD-10-CM | POA: Diagnosis not present

## 2022-12-23 DIAGNOSIS — Z7983 Long term (current) use of bisphosphonates: Secondary | ICD-10-CM | POA: Diagnosis not present

## 2022-12-23 DIAGNOSIS — L97519 Non-pressure chronic ulcer of other part of right foot with unspecified severity: Secondary | ICD-10-CM | POA: Diagnosis not present

## 2022-12-23 DIAGNOSIS — Z792 Long term (current) use of antibiotics: Secondary | ICD-10-CM | POA: Diagnosis not present

## 2022-12-24 DIAGNOSIS — R030 Elevated blood-pressure reading, without diagnosis of hypertension: Secondary | ICD-10-CM | POA: Diagnosis not present

## 2022-12-24 DIAGNOSIS — L03115 Cellulitis of right lower limb: Secondary | ICD-10-CM | POA: Diagnosis not present

## 2022-12-24 DIAGNOSIS — Z6821 Body mass index (BMI) 21.0-21.9, adult: Secondary | ICD-10-CM | POA: Diagnosis not present

## 2022-12-29 DIAGNOSIS — I890 Lymphedema, not elsewhere classified: Secondary | ICD-10-CM | POA: Diagnosis not present

## 2022-12-29 DIAGNOSIS — R4582 Worries: Secondary | ICD-10-CM | POA: Diagnosis not present

## 2022-12-29 DIAGNOSIS — Z7982 Long term (current) use of aspirin: Secondary | ICD-10-CM | POA: Diagnosis not present

## 2022-12-29 DIAGNOSIS — L280 Lichen simplex chronicus: Secondary | ICD-10-CM | POA: Diagnosis not present

## 2022-12-29 DIAGNOSIS — Z9181 History of falling: Secondary | ICD-10-CM | POA: Diagnosis not present

## 2022-12-29 DIAGNOSIS — M810 Age-related osteoporosis without current pathological fracture: Secondary | ICD-10-CM | POA: Diagnosis not present

## 2022-12-29 DIAGNOSIS — L97322 Non-pressure chronic ulcer of left ankle with fat layer exposed: Secondary | ICD-10-CM | POA: Diagnosis not present

## 2022-12-29 DIAGNOSIS — E7849 Other hyperlipidemia: Secondary | ICD-10-CM | POA: Diagnosis not present

## 2022-12-29 DIAGNOSIS — I10 Essential (primary) hypertension: Secondary | ICD-10-CM | POA: Diagnosis not present

## 2022-12-29 DIAGNOSIS — R69 Illness, unspecified: Secondary | ICD-10-CM | POA: Diagnosis not present

## 2022-12-29 DIAGNOSIS — Z8616 Personal history of COVID-19: Secondary | ICD-10-CM | POA: Diagnosis not present

## 2022-12-29 DIAGNOSIS — I872 Venous insufficiency (chronic) (peripheral): Secondary | ICD-10-CM | POA: Diagnosis not present

## 2022-12-29 DIAGNOSIS — M25511 Pain in right shoulder: Secondary | ICD-10-CM | POA: Diagnosis not present

## 2022-12-29 DIAGNOSIS — K580 Irritable bowel syndrome with diarrhea: Secondary | ICD-10-CM | POA: Diagnosis not present

## 2022-12-29 DIAGNOSIS — K7581 Nonalcoholic steatohepatitis (NASH): Secondary | ICD-10-CM | POA: Diagnosis not present

## 2022-12-29 DIAGNOSIS — E440 Moderate protein-calorie malnutrition: Secondary | ICD-10-CM | POA: Diagnosis not present

## 2022-12-29 DIAGNOSIS — D692 Other nonthrombocytopenic purpura: Secondary | ICD-10-CM | POA: Diagnosis not present

## 2022-12-29 DIAGNOSIS — D500 Iron deficiency anemia secondary to blood loss (chronic): Secondary | ICD-10-CM | POA: Diagnosis not present

## 2022-12-29 DIAGNOSIS — R7301 Impaired fasting glucose: Secondary | ICD-10-CM | POA: Diagnosis not present

## 2022-12-29 DIAGNOSIS — I700 Atherosclerosis of aorta: Secondary | ICD-10-CM | POA: Diagnosis not present

## 2022-12-29 DIAGNOSIS — J449 Chronic obstructive pulmonary disease, unspecified: Secondary | ICD-10-CM | POA: Diagnosis not present

## 2023-01-04 DIAGNOSIS — M810 Age-related osteoporosis without current pathological fracture: Secondary | ICD-10-CM | POA: Diagnosis not present

## 2023-01-04 DIAGNOSIS — R69 Illness, unspecified: Secondary | ICD-10-CM | POA: Diagnosis not present

## 2023-01-04 DIAGNOSIS — Z9181 History of falling: Secondary | ICD-10-CM | POA: Diagnosis not present

## 2023-01-04 DIAGNOSIS — K580 Irritable bowel syndrome with diarrhea: Secondary | ICD-10-CM | POA: Diagnosis not present

## 2023-01-04 DIAGNOSIS — Z7982 Long term (current) use of aspirin: Secondary | ICD-10-CM | POA: Diagnosis not present

## 2023-01-04 DIAGNOSIS — D500 Iron deficiency anemia secondary to blood loss (chronic): Secondary | ICD-10-CM | POA: Diagnosis not present

## 2023-01-04 DIAGNOSIS — I890 Lymphedema, not elsewhere classified: Secondary | ICD-10-CM | POA: Diagnosis not present

## 2023-01-04 DIAGNOSIS — I872 Venous insufficiency (chronic) (peripheral): Secondary | ICD-10-CM | POA: Diagnosis not present

## 2023-01-04 DIAGNOSIS — Z8616 Personal history of COVID-19: Secondary | ICD-10-CM | POA: Diagnosis not present

## 2023-01-04 DIAGNOSIS — I700 Atherosclerosis of aorta: Secondary | ICD-10-CM | POA: Diagnosis not present

## 2023-01-04 DIAGNOSIS — I10 Essential (primary) hypertension: Secondary | ICD-10-CM | POA: Diagnosis not present

## 2023-01-04 DIAGNOSIS — J449 Chronic obstructive pulmonary disease, unspecified: Secondary | ICD-10-CM | POA: Diagnosis not present

## 2023-01-04 DIAGNOSIS — L97322 Non-pressure chronic ulcer of left ankle with fat layer exposed: Secondary | ICD-10-CM | POA: Diagnosis not present

## 2023-01-06 DIAGNOSIS — I700 Atherosclerosis of aorta: Secondary | ICD-10-CM | POA: Diagnosis not present

## 2023-01-06 DIAGNOSIS — I872 Venous insufficiency (chronic) (peripheral): Secondary | ICD-10-CM | POA: Diagnosis not present

## 2023-01-06 DIAGNOSIS — J449 Chronic obstructive pulmonary disease, unspecified: Secondary | ICD-10-CM | POA: Diagnosis not present

## 2023-01-06 DIAGNOSIS — I70203 Unspecified atherosclerosis of native arteries of extremities, bilateral legs: Secondary | ICD-10-CM | POA: Diagnosis not present

## 2023-01-06 DIAGNOSIS — K4090 Unilateral inguinal hernia, without obstruction or gangrene, not specified as recurrent: Secondary | ICD-10-CM | POA: Diagnosis not present

## 2023-01-06 DIAGNOSIS — M4316 Spondylolisthesis, lumbar region: Secondary | ICD-10-CM | POA: Diagnosis not present

## 2023-01-06 DIAGNOSIS — M5136 Other intervertebral disc degeneration, lumbar region: Secondary | ICD-10-CM | POA: Diagnosis not present

## 2023-01-06 DIAGNOSIS — I10 Essential (primary) hypertension: Secondary | ICD-10-CM | POA: Diagnosis not present

## 2023-01-06 DIAGNOSIS — Z87891 Personal history of nicotine dependence: Secondary | ICD-10-CM | POA: Diagnosis not present

## 2023-01-06 DIAGNOSIS — Z7982 Long term (current) use of aspirin: Secondary | ICD-10-CM | POA: Diagnosis not present

## 2023-01-06 DIAGNOSIS — I701 Atherosclerosis of renal artery: Secondary | ICD-10-CM | POA: Diagnosis not present

## 2023-01-06 DIAGNOSIS — Z792 Long term (current) use of antibiotics: Secondary | ICD-10-CM | POA: Diagnosis not present

## 2023-01-06 DIAGNOSIS — L97519 Non-pressure chronic ulcer of other part of right foot with unspecified severity: Secondary | ICD-10-CM | POA: Diagnosis not present

## 2023-01-06 DIAGNOSIS — I771 Stricture of artery: Secondary | ICD-10-CM | POA: Diagnosis not present

## 2023-01-06 DIAGNOSIS — Z7983 Long term (current) use of bisphosphonates: Secondary | ICD-10-CM | POA: Diagnosis not present

## 2023-01-06 DIAGNOSIS — L97922 Non-pressure chronic ulcer of unspecified part of left lower leg with fat layer exposed: Secondary | ICD-10-CM | POA: Diagnosis not present

## 2023-01-06 DIAGNOSIS — I8392 Asymptomatic varicose veins of left lower extremity: Secondary | ICD-10-CM | POA: Diagnosis not present

## 2023-01-07 DIAGNOSIS — I739 Peripheral vascular disease, unspecified: Secondary | ICD-10-CM | POA: Diagnosis not present

## 2023-01-07 DIAGNOSIS — E7849 Other hyperlipidemia: Secondary | ICD-10-CM | POA: Diagnosis not present

## 2023-01-07 DIAGNOSIS — I872 Venous insufficiency (chronic) (peripheral): Secondary | ICD-10-CM | POA: Diagnosis not present

## 2023-01-07 DIAGNOSIS — D500 Iron deficiency anemia secondary to blood loss (chronic): Secondary | ICD-10-CM | POA: Diagnosis not present

## 2023-01-07 DIAGNOSIS — J449 Chronic obstructive pulmonary disease, unspecified: Secondary | ICD-10-CM | POA: Diagnosis not present

## 2023-01-07 DIAGNOSIS — I10 Essential (primary) hypertension: Secondary | ICD-10-CM | POA: Diagnosis not present

## 2023-01-07 DIAGNOSIS — L280 Lichen simplex chronicus: Secondary | ICD-10-CM | POA: Diagnosis not present

## 2023-01-07 DIAGNOSIS — L03115 Cellulitis of right lower limb: Secondary | ICD-10-CM | POA: Diagnosis not present

## 2023-01-07 DIAGNOSIS — E440 Moderate protein-calorie malnutrition: Secondary | ICD-10-CM | POA: Diagnosis not present

## 2023-01-07 DIAGNOSIS — R7301 Impaired fasting glucose: Secondary | ICD-10-CM | POA: Diagnosis not present

## 2023-01-07 DIAGNOSIS — M25511 Pain in right shoulder: Secondary | ICD-10-CM | POA: Diagnosis not present

## 2023-01-07 DIAGNOSIS — D692 Other nonthrombocytopenic purpura: Secondary | ICD-10-CM | POA: Diagnosis not present

## 2023-01-12 DIAGNOSIS — I872 Venous insufficiency (chronic) (peripheral): Secondary | ICD-10-CM | POA: Diagnosis not present

## 2023-01-12 DIAGNOSIS — R69 Illness, unspecified: Secondary | ICD-10-CM | POA: Diagnosis not present

## 2023-01-12 DIAGNOSIS — I10 Essential (primary) hypertension: Secondary | ICD-10-CM | POA: Diagnosis not present

## 2023-01-12 DIAGNOSIS — Z9181 History of falling: Secondary | ICD-10-CM | POA: Diagnosis not present

## 2023-01-12 DIAGNOSIS — I700 Atherosclerosis of aorta: Secondary | ICD-10-CM | POA: Diagnosis not present

## 2023-01-12 DIAGNOSIS — M810 Age-related osteoporosis without current pathological fracture: Secondary | ICD-10-CM | POA: Diagnosis not present

## 2023-01-12 DIAGNOSIS — L97322 Non-pressure chronic ulcer of left ankle with fat layer exposed: Secondary | ICD-10-CM | POA: Diagnosis not present

## 2023-01-12 DIAGNOSIS — D500 Iron deficiency anemia secondary to blood loss (chronic): Secondary | ICD-10-CM | POA: Diagnosis not present

## 2023-01-12 DIAGNOSIS — K580 Irritable bowel syndrome with diarrhea: Secondary | ICD-10-CM | POA: Diagnosis not present

## 2023-01-12 DIAGNOSIS — Z8616 Personal history of COVID-19: Secondary | ICD-10-CM | POA: Diagnosis not present

## 2023-01-12 DIAGNOSIS — Z7982 Long term (current) use of aspirin: Secondary | ICD-10-CM | POA: Diagnosis not present

## 2023-01-12 DIAGNOSIS — I890 Lymphedema, not elsewhere classified: Secondary | ICD-10-CM | POA: Diagnosis not present

## 2023-01-12 DIAGNOSIS — J449 Chronic obstructive pulmonary disease, unspecified: Secondary | ICD-10-CM | POA: Diagnosis not present

## 2023-01-14 DIAGNOSIS — R69 Illness, unspecified: Secondary | ICD-10-CM | POA: Diagnosis not present

## 2023-01-14 DIAGNOSIS — I700 Atherosclerosis of aorta: Secondary | ICD-10-CM | POA: Diagnosis not present

## 2023-01-14 DIAGNOSIS — Z9181 History of falling: Secondary | ICD-10-CM | POA: Diagnosis not present

## 2023-01-14 DIAGNOSIS — J449 Chronic obstructive pulmonary disease, unspecified: Secondary | ICD-10-CM | POA: Diagnosis not present

## 2023-01-14 DIAGNOSIS — I872 Venous insufficiency (chronic) (peripheral): Secondary | ICD-10-CM | POA: Diagnosis not present

## 2023-01-14 DIAGNOSIS — L97322 Non-pressure chronic ulcer of left ankle with fat layer exposed: Secondary | ICD-10-CM | POA: Diagnosis not present

## 2023-01-14 DIAGNOSIS — Z7982 Long term (current) use of aspirin: Secondary | ICD-10-CM | POA: Diagnosis not present

## 2023-01-14 DIAGNOSIS — I890 Lymphedema, not elsewhere classified: Secondary | ICD-10-CM | POA: Diagnosis not present

## 2023-01-14 DIAGNOSIS — D500 Iron deficiency anemia secondary to blood loss (chronic): Secondary | ICD-10-CM | POA: Diagnosis not present

## 2023-01-14 DIAGNOSIS — K580 Irritable bowel syndrome with diarrhea: Secondary | ICD-10-CM | POA: Diagnosis not present

## 2023-01-14 DIAGNOSIS — M810 Age-related osteoporosis without current pathological fracture: Secondary | ICD-10-CM | POA: Diagnosis not present

## 2023-01-14 DIAGNOSIS — I10 Essential (primary) hypertension: Secondary | ICD-10-CM | POA: Diagnosis not present

## 2023-01-14 DIAGNOSIS — Z8616 Personal history of COVID-19: Secondary | ICD-10-CM | POA: Diagnosis not present

## 2023-01-19 DIAGNOSIS — I10 Essential (primary) hypertension: Secondary | ICD-10-CM | POA: Diagnosis not present

## 2023-01-19 DIAGNOSIS — I890 Lymphedema, not elsewhere classified: Secondary | ICD-10-CM | POA: Diagnosis not present

## 2023-01-19 DIAGNOSIS — I872 Venous insufficiency (chronic) (peripheral): Secondary | ICD-10-CM | POA: Diagnosis not present

## 2023-01-19 DIAGNOSIS — K580 Irritable bowel syndrome with diarrhea: Secondary | ICD-10-CM | POA: Diagnosis not present

## 2023-01-19 DIAGNOSIS — I739 Peripheral vascular disease, unspecified: Secondary | ICD-10-CM | POA: Diagnosis not present

## 2023-01-19 DIAGNOSIS — D500 Iron deficiency anemia secondary to blood loss (chronic): Secondary | ICD-10-CM | POA: Diagnosis not present

## 2023-01-19 DIAGNOSIS — Z8616 Personal history of COVID-19: Secondary | ICD-10-CM | POA: Diagnosis not present

## 2023-01-19 DIAGNOSIS — Z7982 Long term (current) use of aspirin: Secondary | ICD-10-CM | POA: Diagnosis not present

## 2023-01-19 DIAGNOSIS — L97322 Non-pressure chronic ulcer of left ankle with fat layer exposed: Secondary | ICD-10-CM | POA: Diagnosis not present

## 2023-01-19 DIAGNOSIS — J449 Chronic obstructive pulmonary disease, unspecified: Secondary | ICD-10-CM | POA: Diagnosis not present

## 2023-01-19 DIAGNOSIS — I700 Atherosclerosis of aorta: Secondary | ICD-10-CM | POA: Diagnosis not present

## 2023-01-19 DIAGNOSIS — R69 Illness, unspecified: Secondary | ICD-10-CM | POA: Diagnosis not present

## 2023-01-19 DIAGNOSIS — Z9181 History of falling: Secondary | ICD-10-CM | POA: Diagnosis not present

## 2023-01-19 DIAGNOSIS — M810 Age-related osteoporosis without current pathological fracture: Secondary | ICD-10-CM | POA: Diagnosis not present

## 2023-01-22 DIAGNOSIS — K580 Irritable bowel syndrome with diarrhea: Secondary | ICD-10-CM | POA: Diagnosis not present

## 2023-01-22 DIAGNOSIS — Z9181 History of falling: Secondary | ICD-10-CM | POA: Diagnosis not present

## 2023-01-22 DIAGNOSIS — I872 Venous insufficiency (chronic) (peripheral): Secondary | ICD-10-CM | POA: Diagnosis not present

## 2023-01-22 DIAGNOSIS — I10 Essential (primary) hypertension: Secondary | ICD-10-CM | POA: Diagnosis not present

## 2023-01-22 DIAGNOSIS — L97322 Non-pressure chronic ulcer of left ankle with fat layer exposed: Secondary | ICD-10-CM | POA: Diagnosis not present

## 2023-01-22 DIAGNOSIS — J449 Chronic obstructive pulmonary disease, unspecified: Secondary | ICD-10-CM | POA: Diagnosis not present

## 2023-01-22 DIAGNOSIS — I890 Lymphedema, not elsewhere classified: Secondary | ICD-10-CM | POA: Diagnosis not present

## 2023-01-22 DIAGNOSIS — M810 Age-related osteoporosis without current pathological fracture: Secondary | ICD-10-CM | POA: Diagnosis not present

## 2023-01-22 DIAGNOSIS — R69 Illness, unspecified: Secondary | ICD-10-CM | POA: Diagnosis not present

## 2023-01-22 DIAGNOSIS — D500 Iron deficiency anemia secondary to blood loss (chronic): Secondary | ICD-10-CM | POA: Diagnosis not present

## 2023-01-22 DIAGNOSIS — I739 Peripheral vascular disease, unspecified: Secondary | ICD-10-CM | POA: Diagnosis not present

## 2023-01-22 DIAGNOSIS — Z8616 Personal history of COVID-19: Secondary | ICD-10-CM | POA: Diagnosis not present

## 2023-01-22 DIAGNOSIS — Z7982 Long term (current) use of aspirin: Secondary | ICD-10-CM | POA: Diagnosis not present

## 2023-01-22 DIAGNOSIS — I700 Atherosclerosis of aorta: Secondary | ICD-10-CM | POA: Diagnosis not present

## 2023-01-25 DIAGNOSIS — I739 Peripheral vascular disease, unspecified: Secondary | ICD-10-CM | POA: Diagnosis not present

## 2023-01-25 DIAGNOSIS — D500 Iron deficiency anemia secondary to blood loss (chronic): Secondary | ICD-10-CM | POA: Diagnosis not present

## 2023-01-25 DIAGNOSIS — R69 Illness, unspecified: Secondary | ICD-10-CM | POA: Diagnosis not present

## 2023-01-25 DIAGNOSIS — M810 Age-related osteoporosis without current pathological fracture: Secondary | ICD-10-CM | POA: Diagnosis not present

## 2023-01-25 DIAGNOSIS — I890 Lymphedema, not elsewhere classified: Secondary | ICD-10-CM | POA: Diagnosis not present

## 2023-01-25 DIAGNOSIS — Z9181 History of falling: Secondary | ICD-10-CM | POA: Diagnosis not present

## 2023-01-25 DIAGNOSIS — I700 Atherosclerosis of aorta: Secondary | ICD-10-CM | POA: Diagnosis not present

## 2023-01-25 DIAGNOSIS — I872 Venous insufficiency (chronic) (peripheral): Secondary | ICD-10-CM | POA: Diagnosis not present

## 2023-01-25 DIAGNOSIS — I10 Essential (primary) hypertension: Secondary | ICD-10-CM | POA: Diagnosis not present

## 2023-01-25 DIAGNOSIS — L97322 Non-pressure chronic ulcer of left ankle with fat layer exposed: Secondary | ICD-10-CM | POA: Diagnosis not present

## 2023-01-25 DIAGNOSIS — J449 Chronic obstructive pulmonary disease, unspecified: Secondary | ICD-10-CM | POA: Diagnosis not present

## 2023-01-25 DIAGNOSIS — Z7982 Long term (current) use of aspirin: Secondary | ICD-10-CM | POA: Diagnosis not present

## 2023-01-25 DIAGNOSIS — Z8616 Personal history of COVID-19: Secondary | ICD-10-CM | POA: Diagnosis not present

## 2023-01-25 DIAGNOSIS — K580 Irritable bowel syndrome with diarrhea: Secondary | ICD-10-CM | POA: Diagnosis not present

## 2023-01-27 DIAGNOSIS — I872 Venous insufficiency (chronic) (peripheral): Secondary | ICD-10-CM | POA: Diagnosis not present

## 2023-01-27 DIAGNOSIS — I70203 Unspecified atherosclerosis of native arteries of extremities, bilateral legs: Secondary | ICD-10-CM | POA: Diagnosis not present

## 2023-01-27 DIAGNOSIS — J449 Chronic obstructive pulmonary disease, unspecified: Secondary | ICD-10-CM | POA: Diagnosis not present

## 2023-01-27 DIAGNOSIS — L97519 Non-pressure chronic ulcer of other part of right foot with unspecified severity: Secondary | ICD-10-CM | POA: Diagnosis not present

## 2023-01-27 DIAGNOSIS — I701 Atherosclerosis of renal artery: Secondary | ICD-10-CM | POA: Diagnosis not present

## 2023-01-27 DIAGNOSIS — K219 Gastro-esophageal reflux disease without esophagitis: Secondary | ICD-10-CM | POA: Diagnosis not present

## 2023-01-27 DIAGNOSIS — I700 Atherosclerosis of aorta: Secondary | ICD-10-CM | POA: Diagnosis not present

## 2023-01-27 DIAGNOSIS — K4090 Unilateral inguinal hernia, without obstruction or gangrene, not specified as recurrent: Secondary | ICD-10-CM | POA: Diagnosis not present

## 2023-01-27 DIAGNOSIS — L97321 Non-pressure chronic ulcer of left ankle limited to breakdown of skin: Secondary | ICD-10-CM | POA: Diagnosis not present

## 2023-01-27 DIAGNOSIS — Z7983 Long term (current) use of bisphosphonates: Secondary | ICD-10-CM | POA: Diagnosis not present

## 2023-01-27 DIAGNOSIS — I890 Lymphedema, not elsewhere classified: Secondary | ICD-10-CM | POA: Diagnosis not present

## 2023-01-27 DIAGNOSIS — Z7982 Long term (current) use of aspirin: Secondary | ICD-10-CM | POA: Diagnosis not present

## 2023-01-27 DIAGNOSIS — M4316 Spondylolisthesis, lumbar region: Secondary | ICD-10-CM | POA: Diagnosis not present

## 2023-01-27 DIAGNOSIS — L97922 Non-pressure chronic ulcer of unspecified part of left lower leg with fat layer exposed: Secondary | ICD-10-CM | POA: Diagnosis not present

## 2023-01-27 DIAGNOSIS — Z87891 Personal history of nicotine dependence: Secondary | ICD-10-CM | POA: Diagnosis not present

## 2023-01-27 DIAGNOSIS — Z792 Long term (current) use of antibiotics: Secondary | ICD-10-CM | POA: Diagnosis not present

## 2023-01-27 DIAGNOSIS — I8392 Asymptomatic varicose veins of left lower extremity: Secondary | ICD-10-CM | POA: Diagnosis not present

## 2023-01-27 DIAGNOSIS — S91101D Unspecified open wound of right great toe without damage to nail, subsequent encounter: Secondary | ICD-10-CM | POA: Diagnosis not present

## 2023-01-27 DIAGNOSIS — I10 Essential (primary) hypertension: Secondary | ICD-10-CM | POA: Diagnosis not present

## 2023-01-27 DIAGNOSIS — M5136 Other intervertebral disc degeneration, lumbar region: Secondary | ICD-10-CM | POA: Diagnosis not present

## 2023-01-27 DIAGNOSIS — X58XXXD Exposure to other specified factors, subsequent encounter: Secondary | ICD-10-CM | POA: Diagnosis not present

## 2023-01-28 DIAGNOSIS — L97322 Non-pressure chronic ulcer of left ankle with fat layer exposed: Secondary | ICD-10-CM | POA: Diagnosis not present

## 2023-01-28 DIAGNOSIS — I700 Atherosclerosis of aorta: Secondary | ICD-10-CM | POA: Diagnosis not present

## 2023-01-28 DIAGNOSIS — D500 Iron deficiency anemia secondary to blood loss (chronic): Secondary | ICD-10-CM | POA: Diagnosis not present

## 2023-01-28 DIAGNOSIS — Z8616 Personal history of COVID-19: Secondary | ICD-10-CM | POA: Diagnosis not present

## 2023-01-28 DIAGNOSIS — J449 Chronic obstructive pulmonary disease, unspecified: Secondary | ICD-10-CM | POA: Diagnosis not present

## 2023-01-28 DIAGNOSIS — K580 Irritable bowel syndrome with diarrhea: Secondary | ICD-10-CM | POA: Diagnosis not present

## 2023-01-28 DIAGNOSIS — I872 Venous insufficiency (chronic) (peripheral): Secondary | ICD-10-CM | POA: Diagnosis not present

## 2023-01-28 DIAGNOSIS — Z7982 Long term (current) use of aspirin: Secondary | ICD-10-CM | POA: Diagnosis not present

## 2023-01-28 DIAGNOSIS — M810 Age-related osteoporosis without current pathological fracture: Secondary | ICD-10-CM | POA: Diagnosis not present

## 2023-01-28 DIAGNOSIS — I10 Essential (primary) hypertension: Secondary | ICD-10-CM | POA: Diagnosis not present

## 2023-01-28 DIAGNOSIS — R69 Illness, unspecified: Secondary | ICD-10-CM | POA: Diagnosis not present

## 2023-01-28 DIAGNOSIS — I890 Lymphedema, not elsewhere classified: Secondary | ICD-10-CM | POA: Diagnosis not present

## 2023-01-28 DIAGNOSIS — Z9181 History of falling: Secondary | ICD-10-CM | POA: Diagnosis not present

## 2023-01-28 DIAGNOSIS — I739 Peripheral vascular disease, unspecified: Secondary | ICD-10-CM | POA: Diagnosis not present

## 2023-02-02 DIAGNOSIS — L97322 Non-pressure chronic ulcer of left ankle with fat layer exposed: Secondary | ICD-10-CM | POA: Diagnosis not present

## 2023-02-02 DIAGNOSIS — Z8616 Personal history of COVID-19: Secondary | ICD-10-CM | POA: Diagnosis not present

## 2023-02-02 DIAGNOSIS — Z7982 Long term (current) use of aspirin: Secondary | ICD-10-CM | POA: Diagnosis not present

## 2023-02-02 DIAGNOSIS — M810 Age-related osteoporosis without current pathological fracture: Secondary | ICD-10-CM | POA: Diagnosis not present

## 2023-02-02 DIAGNOSIS — I890 Lymphedema, not elsewhere classified: Secondary | ICD-10-CM | POA: Diagnosis not present

## 2023-02-02 DIAGNOSIS — J449 Chronic obstructive pulmonary disease, unspecified: Secondary | ICD-10-CM | POA: Diagnosis not present

## 2023-02-02 DIAGNOSIS — I739 Peripheral vascular disease, unspecified: Secondary | ICD-10-CM | POA: Diagnosis not present

## 2023-02-02 DIAGNOSIS — R69 Illness, unspecified: Secondary | ICD-10-CM | POA: Diagnosis not present

## 2023-02-02 DIAGNOSIS — I700 Atherosclerosis of aorta: Secondary | ICD-10-CM | POA: Diagnosis not present

## 2023-02-02 DIAGNOSIS — I10 Essential (primary) hypertension: Secondary | ICD-10-CM | POA: Diagnosis not present

## 2023-02-02 DIAGNOSIS — K580 Irritable bowel syndrome with diarrhea: Secondary | ICD-10-CM | POA: Diagnosis not present

## 2023-02-02 DIAGNOSIS — I872 Venous insufficiency (chronic) (peripheral): Secondary | ICD-10-CM | POA: Diagnosis not present

## 2023-02-02 DIAGNOSIS — D500 Iron deficiency anemia secondary to blood loss (chronic): Secondary | ICD-10-CM | POA: Diagnosis not present

## 2023-02-02 DIAGNOSIS — Z9181 History of falling: Secondary | ICD-10-CM | POA: Diagnosis not present

## 2023-02-05 DIAGNOSIS — M810 Age-related osteoporosis without current pathological fracture: Secondary | ICD-10-CM | POA: Diagnosis not present

## 2023-02-05 DIAGNOSIS — I700 Atherosclerosis of aorta: Secondary | ICD-10-CM | POA: Diagnosis not present

## 2023-02-05 DIAGNOSIS — I10 Essential (primary) hypertension: Secondary | ICD-10-CM | POA: Diagnosis not present

## 2023-02-05 DIAGNOSIS — I890 Lymphedema, not elsewhere classified: Secondary | ICD-10-CM | POA: Diagnosis not present

## 2023-02-05 DIAGNOSIS — I739 Peripheral vascular disease, unspecified: Secondary | ICD-10-CM | POA: Diagnosis not present

## 2023-02-05 DIAGNOSIS — I872 Venous insufficiency (chronic) (peripheral): Secondary | ICD-10-CM | POA: Diagnosis not present

## 2023-02-05 DIAGNOSIS — D500 Iron deficiency anemia secondary to blood loss (chronic): Secondary | ICD-10-CM | POA: Diagnosis not present

## 2023-02-05 DIAGNOSIS — L97322 Non-pressure chronic ulcer of left ankle with fat layer exposed: Secondary | ICD-10-CM | POA: Diagnosis not present

## 2023-02-05 DIAGNOSIS — Z9181 History of falling: Secondary | ICD-10-CM | POA: Diagnosis not present

## 2023-02-05 DIAGNOSIS — K580 Irritable bowel syndrome with diarrhea: Secondary | ICD-10-CM | POA: Diagnosis not present

## 2023-02-05 DIAGNOSIS — Z8616 Personal history of COVID-19: Secondary | ICD-10-CM | POA: Diagnosis not present

## 2023-02-05 DIAGNOSIS — Z7982 Long term (current) use of aspirin: Secondary | ICD-10-CM | POA: Diagnosis not present

## 2023-02-05 DIAGNOSIS — R69 Illness, unspecified: Secondary | ICD-10-CM | POA: Diagnosis not present

## 2023-02-05 DIAGNOSIS — J449 Chronic obstructive pulmonary disease, unspecified: Secondary | ICD-10-CM | POA: Diagnosis not present

## 2023-02-08 DIAGNOSIS — J449 Chronic obstructive pulmonary disease, unspecified: Secondary | ICD-10-CM | POA: Diagnosis not present

## 2023-02-08 DIAGNOSIS — I872 Venous insufficiency (chronic) (peripheral): Secondary | ICD-10-CM | POA: Diagnosis not present

## 2023-02-08 DIAGNOSIS — I739 Peripheral vascular disease, unspecified: Secondary | ICD-10-CM | POA: Diagnosis not present

## 2023-02-08 DIAGNOSIS — I700 Atherosclerosis of aorta: Secondary | ICD-10-CM | POA: Diagnosis not present

## 2023-02-08 DIAGNOSIS — I10 Essential (primary) hypertension: Secondary | ICD-10-CM | POA: Diagnosis not present

## 2023-02-08 DIAGNOSIS — I890 Lymphedema, not elsewhere classified: Secondary | ICD-10-CM | POA: Diagnosis not present

## 2023-02-08 DIAGNOSIS — M810 Age-related osteoporosis without current pathological fracture: Secondary | ICD-10-CM | POA: Diagnosis not present

## 2023-02-08 DIAGNOSIS — D500 Iron deficiency anemia secondary to blood loss (chronic): Secondary | ICD-10-CM | POA: Diagnosis not present

## 2023-02-08 DIAGNOSIS — L97322 Non-pressure chronic ulcer of left ankle with fat layer exposed: Secondary | ICD-10-CM | POA: Diagnosis not present

## 2023-02-08 DIAGNOSIS — K580 Irritable bowel syndrome with diarrhea: Secondary | ICD-10-CM | POA: Diagnosis not present

## 2023-02-08 DIAGNOSIS — Z8616 Personal history of COVID-19: Secondary | ICD-10-CM | POA: Diagnosis not present

## 2023-02-08 DIAGNOSIS — Z7982 Long term (current) use of aspirin: Secondary | ICD-10-CM | POA: Diagnosis not present

## 2023-02-08 DIAGNOSIS — R69 Illness, unspecified: Secondary | ICD-10-CM | POA: Diagnosis not present

## 2023-02-08 DIAGNOSIS — Z9181 History of falling: Secondary | ICD-10-CM | POA: Diagnosis not present

## 2023-02-11 DIAGNOSIS — K580 Irritable bowel syndrome with diarrhea: Secondary | ICD-10-CM | POA: Diagnosis not present

## 2023-02-11 DIAGNOSIS — R69 Illness, unspecified: Secondary | ICD-10-CM | POA: Diagnosis not present

## 2023-02-11 DIAGNOSIS — L97322 Non-pressure chronic ulcer of left ankle with fat layer exposed: Secondary | ICD-10-CM | POA: Diagnosis not present

## 2023-02-11 DIAGNOSIS — Z7982 Long term (current) use of aspirin: Secondary | ICD-10-CM | POA: Diagnosis not present

## 2023-02-11 DIAGNOSIS — Z9181 History of falling: Secondary | ICD-10-CM | POA: Diagnosis not present

## 2023-02-11 DIAGNOSIS — J449 Chronic obstructive pulmonary disease, unspecified: Secondary | ICD-10-CM | POA: Diagnosis not present

## 2023-02-11 DIAGNOSIS — Z8616 Personal history of COVID-19: Secondary | ICD-10-CM | POA: Diagnosis not present

## 2023-02-11 DIAGNOSIS — I890 Lymphedema, not elsewhere classified: Secondary | ICD-10-CM | POA: Diagnosis not present

## 2023-02-11 DIAGNOSIS — I872 Venous insufficiency (chronic) (peripheral): Secondary | ICD-10-CM | POA: Diagnosis not present

## 2023-02-11 DIAGNOSIS — I739 Peripheral vascular disease, unspecified: Secondary | ICD-10-CM | POA: Diagnosis not present

## 2023-02-11 DIAGNOSIS — M810 Age-related osteoporosis without current pathological fracture: Secondary | ICD-10-CM | POA: Diagnosis not present

## 2023-02-11 DIAGNOSIS — I10 Essential (primary) hypertension: Secondary | ICD-10-CM | POA: Diagnosis not present

## 2023-02-11 DIAGNOSIS — I700 Atherosclerosis of aorta: Secondary | ICD-10-CM | POA: Diagnosis not present

## 2023-02-11 DIAGNOSIS — D500 Iron deficiency anemia secondary to blood loss (chronic): Secondary | ICD-10-CM | POA: Diagnosis not present

## 2023-02-16 DIAGNOSIS — I890 Lymphedema, not elsewhere classified: Secondary | ICD-10-CM | POA: Diagnosis not present

## 2023-02-16 DIAGNOSIS — Z8616 Personal history of COVID-19: Secondary | ICD-10-CM | POA: Diagnosis not present

## 2023-02-16 DIAGNOSIS — K580 Irritable bowel syndrome with diarrhea: Secondary | ICD-10-CM | POA: Diagnosis not present

## 2023-02-16 DIAGNOSIS — Z9181 History of falling: Secondary | ICD-10-CM | POA: Diagnosis not present

## 2023-02-16 DIAGNOSIS — D500 Iron deficiency anemia secondary to blood loss (chronic): Secondary | ICD-10-CM | POA: Diagnosis not present

## 2023-02-16 DIAGNOSIS — Z7982 Long term (current) use of aspirin: Secondary | ICD-10-CM | POA: Diagnosis not present

## 2023-02-16 DIAGNOSIS — I872 Venous insufficiency (chronic) (peripheral): Secondary | ICD-10-CM | POA: Diagnosis not present

## 2023-02-16 DIAGNOSIS — I10 Essential (primary) hypertension: Secondary | ICD-10-CM | POA: Diagnosis not present

## 2023-02-16 DIAGNOSIS — I700 Atherosclerosis of aorta: Secondary | ICD-10-CM | POA: Diagnosis not present

## 2023-02-16 DIAGNOSIS — R69 Illness, unspecified: Secondary | ICD-10-CM | POA: Diagnosis not present

## 2023-02-16 DIAGNOSIS — M810 Age-related osteoporosis without current pathological fracture: Secondary | ICD-10-CM | POA: Diagnosis not present

## 2023-02-16 DIAGNOSIS — I739 Peripheral vascular disease, unspecified: Secondary | ICD-10-CM | POA: Diagnosis not present

## 2023-02-16 DIAGNOSIS — J449 Chronic obstructive pulmonary disease, unspecified: Secondary | ICD-10-CM | POA: Diagnosis not present

## 2023-02-16 DIAGNOSIS — L97322 Non-pressure chronic ulcer of left ankle with fat layer exposed: Secondary | ICD-10-CM | POA: Diagnosis not present

## 2023-02-22 DIAGNOSIS — I739 Peripheral vascular disease, unspecified: Secondary | ICD-10-CM | POA: Diagnosis not present

## 2023-02-22 DIAGNOSIS — M79675 Pain in left toe(s): Secondary | ICD-10-CM | POA: Diagnosis not present

## 2023-02-22 DIAGNOSIS — M79674 Pain in right toe(s): Secondary | ICD-10-CM | POA: Diagnosis not present

## 2023-02-22 DIAGNOSIS — B351 Tinea unguium: Secondary | ICD-10-CM | POA: Diagnosis not present

## 2023-02-23 DIAGNOSIS — I872 Venous insufficiency (chronic) (peripheral): Secondary | ICD-10-CM | POA: Diagnosis not present

## 2023-02-23 DIAGNOSIS — F321 Major depressive disorder, single episode, moderate: Secondary | ICD-10-CM | POA: Diagnosis not present

## 2023-02-23 DIAGNOSIS — L97322 Non-pressure chronic ulcer of left ankle with fat layer exposed: Secondary | ICD-10-CM | POA: Diagnosis not present

## 2023-02-23 DIAGNOSIS — I739 Peripheral vascular disease, unspecified: Secondary | ICD-10-CM | POA: Diagnosis not present

## 2023-02-23 DIAGNOSIS — M810 Age-related osteoporosis without current pathological fracture: Secondary | ICD-10-CM | POA: Diagnosis not present

## 2023-02-23 DIAGNOSIS — I890 Lymphedema, not elsewhere classified: Secondary | ICD-10-CM | POA: Diagnosis not present

## 2023-02-23 DIAGNOSIS — I10 Essential (primary) hypertension: Secondary | ICD-10-CM | POA: Diagnosis not present

## 2023-02-23 DIAGNOSIS — D500 Iron deficiency anemia secondary to blood loss (chronic): Secondary | ICD-10-CM | POA: Diagnosis not present

## 2023-02-23 DIAGNOSIS — J449 Chronic obstructive pulmonary disease, unspecified: Secondary | ICD-10-CM | POA: Diagnosis not present

## 2023-02-23 DIAGNOSIS — K580 Irritable bowel syndrome with diarrhea: Secondary | ICD-10-CM | POA: Diagnosis not present

## 2023-02-23 DIAGNOSIS — Z9181 History of falling: Secondary | ICD-10-CM | POA: Diagnosis not present

## 2023-02-23 DIAGNOSIS — Z7982 Long term (current) use of aspirin: Secondary | ICD-10-CM | POA: Diagnosis not present

## 2023-02-23 DIAGNOSIS — Z8616 Personal history of COVID-19: Secondary | ICD-10-CM | POA: Diagnosis not present

## 2023-02-23 DIAGNOSIS — F419 Anxiety disorder, unspecified: Secondary | ICD-10-CM | POA: Diagnosis not present

## 2023-02-23 DIAGNOSIS — I700 Atherosclerosis of aorta: Secondary | ICD-10-CM | POA: Diagnosis not present

## 2023-02-24 DIAGNOSIS — L97222 Non-pressure chronic ulcer of left calf with fat layer exposed: Secondary | ICD-10-CM | POA: Diagnosis not present

## 2023-02-24 DIAGNOSIS — Z87891 Personal history of nicotine dependence: Secondary | ICD-10-CM | POA: Diagnosis not present

## 2023-02-24 DIAGNOSIS — R600 Localized edema: Secondary | ICD-10-CM | POA: Diagnosis not present

## 2023-02-24 DIAGNOSIS — I83019 Varicose veins of right lower extremity with ulcer of unspecified site: Secondary | ICD-10-CM | POA: Diagnosis not present

## 2023-02-24 DIAGNOSIS — L97929 Non-pressure chronic ulcer of unspecified part of left lower leg with unspecified severity: Secondary | ICD-10-CM | POA: Diagnosis not present

## 2023-02-24 DIAGNOSIS — I890 Lymphedema, not elsewhere classified: Secondary | ICD-10-CM | POA: Diagnosis not present

## 2023-02-24 DIAGNOSIS — L97919 Non-pressure chronic ulcer of unspecified part of right lower leg with unspecified severity: Secondary | ICD-10-CM | POA: Diagnosis not present

## 2023-02-24 DIAGNOSIS — I7092 Chronic total occlusion of artery of the extremities: Secondary | ICD-10-CM | POA: Diagnosis not present

## 2023-02-24 DIAGNOSIS — I872 Venous insufficiency (chronic) (peripheral): Secondary | ICD-10-CM | POA: Diagnosis not present

## 2023-02-24 DIAGNOSIS — I10 Essential (primary) hypertension: Secondary | ICD-10-CM | POA: Diagnosis not present

## 2023-02-24 DIAGNOSIS — I83029 Varicose veins of left lower extremity with ulcer of unspecified site: Secondary | ICD-10-CM | POA: Diagnosis not present

## 2023-03-04 DIAGNOSIS — Z7982 Long term (current) use of aspirin: Secondary | ICD-10-CM | POA: Diagnosis not present

## 2023-03-04 DIAGNOSIS — F419 Anxiety disorder, unspecified: Secondary | ICD-10-CM | POA: Diagnosis not present

## 2023-03-04 DIAGNOSIS — Z8616 Personal history of COVID-19: Secondary | ICD-10-CM | POA: Diagnosis not present

## 2023-03-04 DIAGNOSIS — M810 Age-related osteoporosis without current pathological fracture: Secondary | ICD-10-CM | POA: Diagnosis not present

## 2023-03-04 DIAGNOSIS — F321 Major depressive disorder, single episode, moderate: Secondary | ICD-10-CM | POA: Diagnosis not present

## 2023-03-04 DIAGNOSIS — I10 Essential (primary) hypertension: Secondary | ICD-10-CM | POA: Diagnosis not present

## 2023-03-04 DIAGNOSIS — D500 Iron deficiency anemia secondary to blood loss (chronic): Secondary | ICD-10-CM | POA: Diagnosis not present

## 2023-03-04 DIAGNOSIS — I890 Lymphedema, not elsewhere classified: Secondary | ICD-10-CM | POA: Diagnosis not present

## 2023-03-04 DIAGNOSIS — I700 Atherosclerosis of aorta: Secondary | ICD-10-CM | POA: Diagnosis not present

## 2023-03-04 DIAGNOSIS — I872 Venous insufficiency (chronic) (peripheral): Secondary | ICD-10-CM | POA: Diagnosis not present

## 2023-03-04 DIAGNOSIS — Z9181 History of falling: Secondary | ICD-10-CM | POA: Diagnosis not present

## 2023-03-04 DIAGNOSIS — I739 Peripheral vascular disease, unspecified: Secondary | ICD-10-CM | POA: Diagnosis not present

## 2023-03-04 DIAGNOSIS — J449 Chronic obstructive pulmonary disease, unspecified: Secondary | ICD-10-CM | POA: Diagnosis not present

## 2023-03-04 DIAGNOSIS — L97322 Non-pressure chronic ulcer of left ankle with fat layer exposed: Secondary | ICD-10-CM | POA: Diagnosis not present

## 2023-03-04 DIAGNOSIS — K580 Irritable bowel syndrome with diarrhea: Secondary | ICD-10-CM | POA: Diagnosis not present

## 2023-03-08 DIAGNOSIS — I890 Lymphedema, not elsewhere classified: Secondary | ICD-10-CM | POA: Diagnosis not present

## 2023-03-08 DIAGNOSIS — J449 Chronic obstructive pulmonary disease, unspecified: Secondary | ICD-10-CM | POA: Diagnosis not present

## 2023-03-08 DIAGNOSIS — Z9181 History of falling: Secondary | ICD-10-CM | POA: Diagnosis not present

## 2023-03-08 DIAGNOSIS — Z7982 Long term (current) use of aspirin: Secondary | ICD-10-CM | POA: Diagnosis not present

## 2023-03-08 DIAGNOSIS — L97322 Non-pressure chronic ulcer of left ankle with fat layer exposed: Secondary | ICD-10-CM | POA: Diagnosis not present

## 2023-03-08 DIAGNOSIS — I10 Essential (primary) hypertension: Secondary | ICD-10-CM | POA: Diagnosis not present

## 2023-03-08 DIAGNOSIS — I872 Venous insufficiency (chronic) (peripheral): Secondary | ICD-10-CM | POA: Diagnosis not present

## 2023-03-08 DIAGNOSIS — Z8616 Personal history of COVID-19: Secondary | ICD-10-CM | POA: Diagnosis not present

## 2023-03-08 DIAGNOSIS — M810 Age-related osteoporosis without current pathological fracture: Secondary | ICD-10-CM | POA: Diagnosis not present

## 2023-03-08 DIAGNOSIS — K580 Irritable bowel syndrome with diarrhea: Secondary | ICD-10-CM | POA: Diagnosis not present

## 2023-03-08 DIAGNOSIS — D500 Iron deficiency anemia secondary to blood loss (chronic): Secondary | ICD-10-CM | POA: Diagnosis not present

## 2023-03-08 DIAGNOSIS — F321 Major depressive disorder, single episode, moderate: Secondary | ICD-10-CM | POA: Diagnosis not present

## 2023-03-08 DIAGNOSIS — I739 Peripheral vascular disease, unspecified: Secondary | ICD-10-CM | POA: Diagnosis not present

## 2023-03-08 DIAGNOSIS — I700 Atherosclerosis of aorta: Secondary | ICD-10-CM | POA: Diagnosis not present

## 2023-03-08 DIAGNOSIS — F419 Anxiety disorder, unspecified: Secondary | ICD-10-CM | POA: Diagnosis not present

## 2023-03-11 DIAGNOSIS — I700 Atherosclerosis of aorta: Secondary | ICD-10-CM | POA: Diagnosis not present

## 2023-03-11 DIAGNOSIS — I872 Venous insufficiency (chronic) (peripheral): Secondary | ICD-10-CM | POA: Diagnosis not present

## 2023-03-11 DIAGNOSIS — I10 Essential (primary) hypertension: Secondary | ICD-10-CM | POA: Diagnosis not present

## 2023-03-11 DIAGNOSIS — M810 Age-related osteoporosis without current pathological fracture: Secondary | ICD-10-CM | POA: Diagnosis not present

## 2023-03-11 DIAGNOSIS — Z9181 History of falling: Secondary | ICD-10-CM | POA: Diagnosis not present

## 2023-03-11 DIAGNOSIS — J449 Chronic obstructive pulmonary disease, unspecified: Secondary | ICD-10-CM | POA: Diagnosis not present

## 2023-03-11 DIAGNOSIS — F321 Major depressive disorder, single episode, moderate: Secondary | ICD-10-CM | POA: Diagnosis not present

## 2023-03-11 DIAGNOSIS — I739 Peripheral vascular disease, unspecified: Secondary | ICD-10-CM | POA: Diagnosis not present

## 2023-03-11 DIAGNOSIS — D500 Iron deficiency anemia secondary to blood loss (chronic): Secondary | ICD-10-CM | POA: Diagnosis not present

## 2023-03-11 DIAGNOSIS — L97322 Non-pressure chronic ulcer of left ankle with fat layer exposed: Secondary | ICD-10-CM | POA: Diagnosis not present

## 2023-03-11 DIAGNOSIS — Z8616 Personal history of COVID-19: Secondary | ICD-10-CM | POA: Diagnosis not present

## 2023-03-11 DIAGNOSIS — F419 Anxiety disorder, unspecified: Secondary | ICD-10-CM | POA: Diagnosis not present

## 2023-03-11 DIAGNOSIS — K580 Irritable bowel syndrome with diarrhea: Secondary | ICD-10-CM | POA: Diagnosis not present

## 2023-03-11 DIAGNOSIS — I890 Lymphedema, not elsewhere classified: Secondary | ICD-10-CM | POA: Diagnosis not present

## 2023-03-11 DIAGNOSIS — Z7982 Long term (current) use of aspirin: Secondary | ICD-10-CM | POA: Diagnosis not present

## 2023-03-16 DIAGNOSIS — K580 Irritable bowel syndrome with diarrhea: Secondary | ICD-10-CM | POA: Diagnosis not present

## 2023-03-16 DIAGNOSIS — F419 Anxiety disorder, unspecified: Secondary | ICD-10-CM | POA: Diagnosis not present

## 2023-03-16 DIAGNOSIS — Z7982 Long term (current) use of aspirin: Secondary | ICD-10-CM | POA: Diagnosis not present

## 2023-03-16 DIAGNOSIS — I10 Essential (primary) hypertension: Secondary | ICD-10-CM | POA: Diagnosis not present

## 2023-03-16 DIAGNOSIS — F321 Major depressive disorder, single episode, moderate: Secondary | ICD-10-CM | POA: Diagnosis not present

## 2023-03-16 DIAGNOSIS — J449 Chronic obstructive pulmonary disease, unspecified: Secondary | ICD-10-CM | POA: Diagnosis not present

## 2023-03-16 DIAGNOSIS — I739 Peripheral vascular disease, unspecified: Secondary | ICD-10-CM | POA: Diagnosis not present

## 2023-03-16 DIAGNOSIS — M810 Age-related osteoporosis without current pathological fracture: Secondary | ICD-10-CM | POA: Diagnosis not present

## 2023-03-16 DIAGNOSIS — Z9181 History of falling: Secondary | ICD-10-CM | POA: Diagnosis not present

## 2023-03-16 DIAGNOSIS — D500 Iron deficiency anemia secondary to blood loss (chronic): Secondary | ICD-10-CM | POA: Diagnosis not present

## 2023-03-16 DIAGNOSIS — Z8616 Personal history of COVID-19: Secondary | ICD-10-CM | POA: Diagnosis not present

## 2023-03-16 DIAGNOSIS — L97322 Non-pressure chronic ulcer of left ankle with fat layer exposed: Secondary | ICD-10-CM | POA: Diagnosis not present

## 2023-03-16 DIAGNOSIS — I890 Lymphedema, not elsewhere classified: Secondary | ICD-10-CM | POA: Diagnosis not present

## 2023-03-16 DIAGNOSIS — I872 Venous insufficiency (chronic) (peripheral): Secondary | ICD-10-CM | POA: Diagnosis not present

## 2023-03-16 DIAGNOSIS — I700 Atherosclerosis of aorta: Secondary | ICD-10-CM | POA: Diagnosis not present

## 2023-03-19 DIAGNOSIS — D500 Iron deficiency anemia secondary to blood loss (chronic): Secondary | ICD-10-CM | POA: Diagnosis not present

## 2023-03-19 DIAGNOSIS — L97322 Non-pressure chronic ulcer of left ankle with fat layer exposed: Secondary | ICD-10-CM | POA: Diagnosis not present

## 2023-03-19 DIAGNOSIS — I10 Essential (primary) hypertension: Secondary | ICD-10-CM | POA: Diagnosis not present

## 2023-03-19 DIAGNOSIS — Z9181 History of falling: Secondary | ICD-10-CM | POA: Diagnosis not present

## 2023-03-19 DIAGNOSIS — Z7982 Long term (current) use of aspirin: Secondary | ICD-10-CM | POA: Diagnosis not present

## 2023-03-19 DIAGNOSIS — F419 Anxiety disorder, unspecified: Secondary | ICD-10-CM | POA: Diagnosis not present

## 2023-03-19 DIAGNOSIS — F321 Major depressive disorder, single episode, moderate: Secondary | ICD-10-CM | POA: Diagnosis not present

## 2023-03-19 DIAGNOSIS — I700 Atherosclerosis of aorta: Secondary | ICD-10-CM | POA: Diagnosis not present

## 2023-03-19 DIAGNOSIS — I890 Lymphedema, not elsewhere classified: Secondary | ICD-10-CM | POA: Diagnosis not present

## 2023-03-19 DIAGNOSIS — K580 Irritable bowel syndrome with diarrhea: Secondary | ICD-10-CM | POA: Diagnosis not present

## 2023-03-19 DIAGNOSIS — M810 Age-related osteoporosis without current pathological fracture: Secondary | ICD-10-CM | POA: Diagnosis not present

## 2023-03-19 DIAGNOSIS — Z8616 Personal history of COVID-19: Secondary | ICD-10-CM | POA: Diagnosis not present

## 2023-03-19 DIAGNOSIS — I739 Peripheral vascular disease, unspecified: Secondary | ICD-10-CM | POA: Diagnosis not present

## 2023-03-19 DIAGNOSIS — J449 Chronic obstructive pulmonary disease, unspecified: Secondary | ICD-10-CM | POA: Diagnosis not present

## 2023-03-19 DIAGNOSIS — I872 Venous insufficiency (chronic) (peripheral): Secondary | ICD-10-CM | POA: Diagnosis not present

## 2023-03-22 DIAGNOSIS — L97322 Non-pressure chronic ulcer of left ankle with fat layer exposed: Secondary | ICD-10-CM | POA: Diagnosis not present

## 2023-03-22 DIAGNOSIS — Z9181 History of falling: Secondary | ICD-10-CM | POA: Diagnosis not present

## 2023-03-22 DIAGNOSIS — Z7982 Long term (current) use of aspirin: Secondary | ICD-10-CM | POA: Diagnosis not present

## 2023-03-22 DIAGNOSIS — M810 Age-related osteoporosis without current pathological fracture: Secondary | ICD-10-CM | POA: Diagnosis not present

## 2023-03-22 DIAGNOSIS — I739 Peripheral vascular disease, unspecified: Secondary | ICD-10-CM | POA: Diagnosis not present

## 2023-03-22 DIAGNOSIS — D500 Iron deficiency anemia secondary to blood loss (chronic): Secondary | ICD-10-CM | POA: Diagnosis not present

## 2023-03-22 DIAGNOSIS — F419 Anxiety disorder, unspecified: Secondary | ICD-10-CM | POA: Diagnosis not present

## 2023-03-22 DIAGNOSIS — I10 Essential (primary) hypertension: Secondary | ICD-10-CM | POA: Diagnosis not present

## 2023-03-22 DIAGNOSIS — F321 Major depressive disorder, single episode, moderate: Secondary | ICD-10-CM | POA: Diagnosis not present

## 2023-03-22 DIAGNOSIS — I890 Lymphedema, not elsewhere classified: Secondary | ICD-10-CM | POA: Diagnosis not present

## 2023-03-22 DIAGNOSIS — I700 Atherosclerosis of aorta: Secondary | ICD-10-CM | POA: Diagnosis not present

## 2023-03-22 DIAGNOSIS — J449 Chronic obstructive pulmonary disease, unspecified: Secondary | ICD-10-CM | POA: Diagnosis not present

## 2023-03-22 DIAGNOSIS — I872 Venous insufficiency (chronic) (peripheral): Secondary | ICD-10-CM | POA: Diagnosis not present

## 2023-03-22 DIAGNOSIS — Z8616 Personal history of COVID-19: Secondary | ICD-10-CM | POA: Diagnosis not present

## 2023-03-22 DIAGNOSIS — K580 Irritable bowel syndrome with diarrhea: Secondary | ICD-10-CM | POA: Diagnosis not present

## 2023-03-25 DIAGNOSIS — Z7982 Long term (current) use of aspirin: Secondary | ICD-10-CM | POA: Diagnosis not present

## 2023-03-25 DIAGNOSIS — Z9181 History of falling: Secondary | ICD-10-CM | POA: Diagnosis not present

## 2023-03-25 DIAGNOSIS — K219 Gastro-esophageal reflux disease without esophagitis: Secondary | ICD-10-CM | POA: Diagnosis not present

## 2023-03-25 DIAGNOSIS — Z1329 Encounter for screening for other suspected endocrine disorder: Secondary | ICD-10-CM | POA: Diagnosis not present

## 2023-03-25 DIAGNOSIS — D649 Anemia, unspecified: Secondary | ICD-10-CM | POA: Diagnosis not present

## 2023-03-25 DIAGNOSIS — I890 Lymphedema, not elsewhere classified: Secondary | ICD-10-CM | POA: Diagnosis not present

## 2023-03-25 DIAGNOSIS — E875 Hyperkalemia: Secondary | ICD-10-CM | POA: Diagnosis not present

## 2023-03-25 DIAGNOSIS — L97322 Non-pressure chronic ulcer of left ankle with fat layer exposed: Secondary | ICD-10-CM | POA: Diagnosis not present

## 2023-03-25 DIAGNOSIS — R5383 Other fatigue: Secondary | ICD-10-CM | POA: Diagnosis not present

## 2023-03-25 DIAGNOSIS — I739 Peripheral vascular disease, unspecified: Secondary | ICD-10-CM | POA: Diagnosis not present

## 2023-03-25 DIAGNOSIS — Z8616 Personal history of COVID-19: Secondary | ICD-10-CM | POA: Diagnosis not present

## 2023-03-25 DIAGNOSIS — I700 Atherosclerosis of aorta: Secondary | ICD-10-CM | POA: Diagnosis not present

## 2023-03-25 DIAGNOSIS — I872 Venous insufficiency (chronic) (peripheral): Secondary | ICD-10-CM | POA: Diagnosis not present

## 2023-03-25 DIAGNOSIS — F419 Anxiety disorder, unspecified: Secondary | ICD-10-CM | POA: Diagnosis not present

## 2023-03-25 DIAGNOSIS — J449 Chronic obstructive pulmonary disease, unspecified: Secondary | ICD-10-CM | POA: Diagnosis not present

## 2023-03-25 DIAGNOSIS — E7849 Other hyperlipidemia: Secondary | ICD-10-CM | POA: Diagnosis not present

## 2023-03-25 DIAGNOSIS — E1165 Type 2 diabetes mellitus with hyperglycemia: Secondary | ICD-10-CM | POA: Diagnosis not present

## 2023-03-25 DIAGNOSIS — K580 Irritable bowel syndrome with diarrhea: Secondary | ICD-10-CM | POA: Diagnosis not present

## 2023-03-25 DIAGNOSIS — F321 Major depressive disorder, single episode, moderate: Secondary | ICD-10-CM | POA: Diagnosis not present

## 2023-03-25 DIAGNOSIS — I10 Essential (primary) hypertension: Secondary | ICD-10-CM | POA: Diagnosis not present

## 2023-03-25 DIAGNOSIS — D500 Iron deficiency anemia secondary to blood loss (chronic): Secondary | ICD-10-CM | POA: Diagnosis not present

## 2023-03-25 DIAGNOSIS — N1831 Chronic kidney disease, stage 3a: Secondary | ICD-10-CM | POA: Diagnosis not present

## 2023-03-25 DIAGNOSIS — M810 Age-related osteoporosis without current pathological fracture: Secondary | ICD-10-CM | POA: Diagnosis not present

## 2023-03-25 DIAGNOSIS — E7801 Familial hypercholesterolemia: Secondary | ICD-10-CM | POA: Diagnosis not present

## 2023-03-30 DIAGNOSIS — Z9181 History of falling: Secondary | ICD-10-CM | POA: Diagnosis not present

## 2023-03-30 DIAGNOSIS — I890 Lymphedema, not elsewhere classified: Secondary | ICD-10-CM | POA: Diagnosis not present

## 2023-03-30 DIAGNOSIS — F419 Anxiety disorder, unspecified: Secondary | ICD-10-CM | POA: Diagnosis not present

## 2023-03-30 DIAGNOSIS — K580 Irritable bowel syndrome with diarrhea: Secondary | ICD-10-CM | POA: Diagnosis not present

## 2023-03-30 DIAGNOSIS — I700 Atherosclerosis of aorta: Secondary | ICD-10-CM | POA: Diagnosis not present

## 2023-03-30 DIAGNOSIS — I872 Venous insufficiency (chronic) (peripheral): Secondary | ICD-10-CM | POA: Diagnosis not present

## 2023-03-30 DIAGNOSIS — F321 Major depressive disorder, single episode, moderate: Secondary | ICD-10-CM | POA: Diagnosis not present

## 2023-03-30 DIAGNOSIS — I739 Peripheral vascular disease, unspecified: Secondary | ICD-10-CM | POA: Diagnosis not present

## 2023-03-30 DIAGNOSIS — J449 Chronic obstructive pulmonary disease, unspecified: Secondary | ICD-10-CM | POA: Diagnosis not present

## 2023-03-30 DIAGNOSIS — Z8616 Personal history of COVID-19: Secondary | ICD-10-CM | POA: Diagnosis not present

## 2023-03-30 DIAGNOSIS — L97322 Non-pressure chronic ulcer of left ankle with fat layer exposed: Secondary | ICD-10-CM | POA: Diagnosis not present

## 2023-03-30 DIAGNOSIS — Z7982 Long term (current) use of aspirin: Secondary | ICD-10-CM | POA: Diagnosis not present

## 2023-03-30 DIAGNOSIS — D500 Iron deficiency anemia secondary to blood loss (chronic): Secondary | ICD-10-CM | POA: Diagnosis not present

## 2023-03-30 DIAGNOSIS — I10 Essential (primary) hypertension: Secondary | ICD-10-CM | POA: Diagnosis not present

## 2023-03-30 DIAGNOSIS — M810 Age-related osteoporosis without current pathological fracture: Secondary | ICD-10-CM | POA: Diagnosis not present

## 2023-04-01 DIAGNOSIS — I700 Atherosclerosis of aorta: Secondary | ICD-10-CM | POA: Diagnosis not present

## 2023-04-01 DIAGNOSIS — D500 Iron deficiency anemia secondary to blood loss (chronic): Secondary | ICD-10-CM | POA: Diagnosis not present

## 2023-04-01 DIAGNOSIS — I10 Essential (primary) hypertension: Secondary | ICD-10-CM | POA: Diagnosis not present

## 2023-04-01 DIAGNOSIS — F419 Anxiety disorder, unspecified: Secondary | ICD-10-CM | POA: Diagnosis not present

## 2023-04-01 DIAGNOSIS — E7849 Other hyperlipidemia: Secondary | ICD-10-CM | POA: Diagnosis not present

## 2023-04-01 DIAGNOSIS — E440 Moderate protein-calorie malnutrition: Secondary | ICD-10-CM | POA: Diagnosis not present

## 2023-04-01 DIAGNOSIS — K580 Irritable bowel syndrome with diarrhea: Secondary | ICD-10-CM | POA: Diagnosis not present

## 2023-04-01 DIAGNOSIS — I739 Peripheral vascular disease, unspecified: Secondary | ICD-10-CM | POA: Diagnosis not present

## 2023-04-01 DIAGNOSIS — M810 Age-related osteoporosis without current pathological fracture: Secondary | ICD-10-CM | POA: Diagnosis not present

## 2023-04-01 DIAGNOSIS — R7301 Impaired fasting glucose: Secondary | ICD-10-CM | POA: Diagnosis not present

## 2023-04-01 DIAGNOSIS — Z23 Encounter for immunization: Secondary | ICD-10-CM | POA: Diagnosis not present

## 2023-04-01 DIAGNOSIS — Z9181 History of falling: Secondary | ICD-10-CM | POA: Diagnosis not present

## 2023-04-01 DIAGNOSIS — J449 Chronic obstructive pulmonary disease, unspecified: Secondary | ICD-10-CM | POA: Diagnosis not present

## 2023-04-01 DIAGNOSIS — F321 Major depressive disorder, single episode, moderate: Secondary | ICD-10-CM | POA: Diagnosis not present

## 2023-04-01 DIAGNOSIS — L280 Lichen simplex chronicus: Secondary | ICD-10-CM | POA: Diagnosis not present

## 2023-04-01 DIAGNOSIS — Z7982 Long term (current) use of aspirin: Secondary | ICD-10-CM | POA: Diagnosis not present

## 2023-04-01 DIAGNOSIS — D692 Other nonthrombocytopenic purpura: Secondary | ICD-10-CM | POA: Diagnosis not present

## 2023-04-01 DIAGNOSIS — R4582 Worries: Secondary | ICD-10-CM | POA: Diagnosis not present

## 2023-04-01 DIAGNOSIS — I872 Venous insufficiency (chronic) (peripheral): Secondary | ICD-10-CM | POA: Diagnosis not present

## 2023-04-01 DIAGNOSIS — I890 Lymphedema, not elsewhere classified: Secondary | ICD-10-CM | POA: Diagnosis not present

## 2023-04-01 DIAGNOSIS — L97322 Non-pressure chronic ulcer of left ankle with fat layer exposed: Secondary | ICD-10-CM | POA: Diagnosis not present

## 2023-04-01 DIAGNOSIS — Z8616 Personal history of COVID-19: Secondary | ICD-10-CM | POA: Diagnosis not present

## 2023-04-01 DIAGNOSIS — L03115 Cellulitis of right lower limb: Secondary | ICD-10-CM | POA: Diagnosis not present

## 2023-04-05 DIAGNOSIS — D500 Iron deficiency anemia secondary to blood loss (chronic): Secondary | ICD-10-CM | POA: Diagnosis not present

## 2023-04-05 DIAGNOSIS — I10 Essential (primary) hypertension: Secondary | ICD-10-CM | POA: Diagnosis not present

## 2023-04-05 DIAGNOSIS — Z8616 Personal history of COVID-19: Secondary | ICD-10-CM | POA: Diagnosis not present

## 2023-04-05 DIAGNOSIS — F419 Anxiety disorder, unspecified: Secondary | ICD-10-CM | POA: Diagnosis not present

## 2023-04-05 DIAGNOSIS — I739 Peripheral vascular disease, unspecified: Secondary | ICD-10-CM | POA: Diagnosis not present

## 2023-04-05 DIAGNOSIS — J449 Chronic obstructive pulmonary disease, unspecified: Secondary | ICD-10-CM | POA: Diagnosis not present

## 2023-04-05 DIAGNOSIS — F321 Major depressive disorder, single episode, moderate: Secondary | ICD-10-CM | POA: Diagnosis not present

## 2023-04-05 DIAGNOSIS — I872 Venous insufficiency (chronic) (peripheral): Secondary | ICD-10-CM | POA: Diagnosis not present

## 2023-04-05 DIAGNOSIS — Z9181 History of falling: Secondary | ICD-10-CM | POA: Diagnosis not present

## 2023-04-05 DIAGNOSIS — K580 Irritable bowel syndrome with diarrhea: Secondary | ICD-10-CM | POA: Diagnosis not present

## 2023-04-05 DIAGNOSIS — L97322 Non-pressure chronic ulcer of left ankle with fat layer exposed: Secondary | ICD-10-CM | POA: Diagnosis not present

## 2023-04-05 DIAGNOSIS — I890 Lymphedema, not elsewhere classified: Secondary | ICD-10-CM | POA: Diagnosis not present

## 2023-04-05 DIAGNOSIS — M810 Age-related osteoporosis without current pathological fracture: Secondary | ICD-10-CM | POA: Diagnosis not present

## 2023-04-05 DIAGNOSIS — Z7982 Long term (current) use of aspirin: Secondary | ICD-10-CM | POA: Diagnosis not present

## 2023-04-05 DIAGNOSIS — I700 Atherosclerosis of aorta: Secondary | ICD-10-CM | POA: Diagnosis not present

## 2023-04-07 DIAGNOSIS — K4090 Unilateral inguinal hernia, without obstruction or gangrene, not specified as recurrent: Secondary | ICD-10-CM | POA: Diagnosis not present

## 2023-04-07 DIAGNOSIS — Z87891 Personal history of nicotine dependence: Secondary | ICD-10-CM | POA: Diagnosis not present

## 2023-04-07 DIAGNOSIS — I10 Essential (primary) hypertension: Secondary | ICD-10-CM | POA: Diagnosis not present

## 2023-04-07 DIAGNOSIS — J449 Chronic obstructive pulmonary disease, unspecified: Secondary | ICD-10-CM | POA: Diagnosis not present

## 2023-04-07 DIAGNOSIS — Z79899 Other long term (current) drug therapy: Secondary | ICD-10-CM | POA: Diagnosis not present

## 2023-04-07 DIAGNOSIS — I872 Venous insufficiency (chronic) (peripheral): Secondary | ICD-10-CM | POA: Diagnosis not present

## 2023-04-07 DIAGNOSIS — L97329 Non-pressure chronic ulcer of left ankle with unspecified severity: Secondary | ICD-10-CM | POA: Diagnosis not present

## 2023-04-07 DIAGNOSIS — I890 Lymphedema, not elsewhere classified: Secondary | ICD-10-CM | POA: Diagnosis not present

## 2023-04-07 DIAGNOSIS — X58XXXA Exposure to other specified factors, initial encounter: Secondary | ICD-10-CM | POA: Diagnosis not present

## 2023-04-07 DIAGNOSIS — Z7983 Long term (current) use of bisphosphonates: Secondary | ICD-10-CM | POA: Diagnosis not present

## 2023-04-07 DIAGNOSIS — S91101A Unspecified open wound of right great toe without damage to nail, initial encounter: Secondary | ICD-10-CM | POA: Diagnosis not present

## 2023-04-07 DIAGNOSIS — Z7982 Long term (current) use of aspirin: Secondary | ICD-10-CM | POA: Diagnosis not present

## 2023-04-08 DIAGNOSIS — I872 Venous insufficiency (chronic) (peripheral): Secondary | ICD-10-CM | POA: Diagnosis not present

## 2023-04-08 DIAGNOSIS — I10 Essential (primary) hypertension: Secondary | ICD-10-CM | POA: Diagnosis not present

## 2023-04-08 DIAGNOSIS — Z9181 History of falling: Secondary | ICD-10-CM | POA: Diagnosis not present

## 2023-04-08 DIAGNOSIS — I890 Lymphedema, not elsewhere classified: Secondary | ICD-10-CM | POA: Diagnosis not present

## 2023-04-08 DIAGNOSIS — M810 Age-related osteoporosis without current pathological fracture: Secondary | ICD-10-CM | POA: Diagnosis not present

## 2023-04-08 DIAGNOSIS — I739 Peripheral vascular disease, unspecified: Secondary | ICD-10-CM | POA: Diagnosis not present

## 2023-04-08 DIAGNOSIS — Z7982 Long term (current) use of aspirin: Secondary | ICD-10-CM | POA: Diagnosis not present

## 2023-04-08 DIAGNOSIS — F419 Anxiety disorder, unspecified: Secondary | ICD-10-CM | POA: Diagnosis not present

## 2023-04-08 DIAGNOSIS — J449 Chronic obstructive pulmonary disease, unspecified: Secondary | ICD-10-CM | POA: Diagnosis not present

## 2023-04-08 DIAGNOSIS — L97322 Non-pressure chronic ulcer of left ankle with fat layer exposed: Secondary | ICD-10-CM | POA: Diagnosis not present

## 2023-04-08 DIAGNOSIS — K580 Irritable bowel syndrome with diarrhea: Secondary | ICD-10-CM | POA: Diagnosis not present

## 2023-04-08 DIAGNOSIS — F321 Major depressive disorder, single episode, moderate: Secondary | ICD-10-CM | POA: Diagnosis not present

## 2023-04-08 DIAGNOSIS — I700 Atherosclerosis of aorta: Secondary | ICD-10-CM | POA: Diagnosis not present

## 2023-04-08 DIAGNOSIS — D500 Iron deficiency anemia secondary to blood loss (chronic): Secondary | ICD-10-CM | POA: Diagnosis not present

## 2023-04-08 DIAGNOSIS — Z8616 Personal history of COVID-19: Secondary | ICD-10-CM | POA: Diagnosis not present

## 2023-04-13 DIAGNOSIS — I890 Lymphedema, not elsewhere classified: Secondary | ICD-10-CM | POA: Diagnosis not present

## 2023-04-13 DIAGNOSIS — K580 Irritable bowel syndrome with diarrhea: Secondary | ICD-10-CM | POA: Diagnosis not present

## 2023-04-13 DIAGNOSIS — I872 Venous insufficiency (chronic) (peripheral): Secondary | ICD-10-CM | POA: Diagnosis not present

## 2023-04-13 DIAGNOSIS — F419 Anxiety disorder, unspecified: Secondary | ICD-10-CM | POA: Diagnosis not present

## 2023-04-13 DIAGNOSIS — L97322 Non-pressure chronic ulcer of left ankle with fat layer exposed: Secondary | ICD-10-CM | POA: Diagnosis not present

## 2023-04-13 DIAGNOSIS — I700 Atherosclerosis of aorta: Secondary | ICD-10-CM | POA: Diagnosis not present

## 2023-04-13 DIAGNOSIS — Z9181 History of falling: Secondary | ICD-10-CM | POA: Diagnosis not present

## 2023-04-13 DIAGNOSIS — D500 Iron deficiency anemia secondary to blood loss (chronic): Secondary | ICD-10-CM | POA: Diagnosis not present

## 2023-04-13 DIAGNOSIS — J449 Chronic obstructive pulmonary disease, unspecified: Secondary | ICD-10-CM | POA: Diagnosis not present

## 2023-04-13 DIAGNOSIS — F321 Major depressive disorder, single episode, moderate: Secondary | ICD-10-CM | POA: Diagnosis not present

## 2023-04-13 DIAGNOSIS — M810 Age-related osteoporosis without current pathological fracture: Secondary | ICD-10-CM | POA: Diagnosis not present

## 2023-04-13 DIAGNOSIS — Z7982 Long term (current) use of aspirin: Secondary | ICD-10-CM | POA: Diagnosis not present

## 2023-04-13 DIAGNOSIS — I10 Essential (primary) hypertension: Secondary | ICD-10-CM | POA: Diagnosis not present

## 2023-04-13 DIAGNOSIS — I739 Peripheral vascular disease, unspecified: Secondary | ICD-10-CM | POA: Diagnosis not present

## 2023-04-13 DIAGNOSIS — Z8616 Personal history of COVID-19: Secondary | ICD-10-CM | POA: Diagnosis not present

## 2023-04-16 DIAGNOSIS — Z9181 History of falling: Secondary | ICD-10-CM | POA: Diagnosis not present

## 2023-04-16 DIAGNOSIS — M810 Age-related osteoporosis without current pathological fracture: Secondary | ICD-10-CM | POA: Diagnosis not present

## 2023-04-16 DIAGNOSIS — L97322 Non-pressure chronic ulcer of left ankle with fat layer exposed: Secondary | ICD-10-CM | POA: Diagnosis not present

## 2023-04-16 DIAGNOSIS — I890 Lymphedema, not elsewhere classified: Secondary | ICD-10-CM | POA: Diagnosis not present

## 2023-04-16 DIAGNOSIS — F321 Major depressive disorder, single episode, moderate: Secondary | ICD-10-CM | POA: Diagnosis not present

## 2023-04-16 DIAGNOSIS — I700 Atherosclerosis of aorta: Secondary | ICD-10-CM | POA: Diagnosis not present

## 2023-04-16 DIAGNOSIS — K580 Irritable bowel syndrome with diarrhea: Secondary | ICD-10-CM | POA: Diagnosis not present

## 2023-04-16 DIAGNOSIS — I739 Peripheral vascular disease, unspecified: Secondary | ICD-10-CM | POA: Diagnosis not present

## 2023-04-16 DIAGNOSIS — J449 Chronic obstructive pulmonary disease, unspecified: Secondary | ICD-10-CM | POA: Diagnosis not present

## 2023-04-16 DIAGNOSIS — Z7982 Long term (current) use of aspirin: Secondary | ICD-10-CM | POA: Diagnosis not present

## 2023-04-16 DIAGNOSIS — Z8616 Personal history of COVID-19: Secondary | ICD-10-CM | POA: Diagnosis not present

## 2023-04-16 DIAGNOSIS — I10 Essential (primary) hypertension: Secondary | ICD-10-CM | POA: Diagnosis not present

## 2023-04-16 DIAGNOSIS — I872 Venous insufficiency (chronic) (peripheral): Secondary | ICD-10-CM | POA: Diagnosis not present

## 2023-04-16 DIAGNOSIS — D500 Iron deficiency anemia secondary to blood loss (chronic): Secondary | ICD-10-CM | POA: Diagnosis not present

## 2023-04-16 DIAGNOSIS — F419 Anxiety disorder, unspecified: Secondary | ICD-10-CM | POA: Diagnosis not present

## 2023-04-19 DIAGNOSIS — J449 Chronic obstructive pulmonary disease, unspecified: Secondary | ICD-10-CM | POA: Diagnosis not present

## 2023-04-19 DIAGNOSIS — I700 Atherosclerosis of aorta: Secondary | ICD-10-CM | POA: Diagnosis not present

## 2023-04-19 DIAGNOSIS — F321 Major depressive disorder, single episode, moderate: Secondary | ICD-10-CM | POA: Diagnosis not present

## 2023-04-19 DIAGNOSIS — F419 Anxiety disorder, unspecified: Secondary | ICD-10-CM | POA: Diagnosis not present

## 2023-04-19 DIAGNOSIS — I872 Venous insufficiency (chronic) (peripheral): Secondary | ICD-10-CM | POA: Diagnosis not present

## 2023-04-19 DIAGNOSIS — I739 Peripheral vascular disease, unspecified: Secondary | ICD-10-CM | POA: Diagnosis not present

## 2023-04-19 DIAGNOSIS — I890 Lymphedema, not elsewhere classified: Secondary | ICD-10-CM | POA: Diagnosis not present

## 2023-04-19 DIAGNOSIS — I10 Essential (primary) hypertension: Secondary | ICD-10-CM | POA: Diagnosis not present

## 2023-04-19 DIAGNOSIS — D500 Iron deficiency anemia secondary to blood loss (chronic): Secondary | ICD-10-CM | POA: Diagnosis not present

## 2023-04-19 DIAGNOSIS — L97322 Non-pressure chronic ulcer of left ankle with fat layer exposed: Secondary | ICD-10-CM | POA: Diagnosis not present

## 2023-04-22 DIAGNOSIS — J449 Chronic obstructive pulmonary disease, unspecified: Secondary | ICD-10-CM | POA: Diagnosis not present

## 2023-04-22 DIAGNOSIS — I890 Lymphedema, not elsewhere classified: Secondary | ICD-10-CM | POA: Diagnosis not present

## 2023-04-22 DIAGNOSIS — I700 Atherosclerosis of aorta: Secondary | ICD-10-CM | POA: Diagnosis not present

## 2023-04-22 DIAGNOSIS — I872 Venous insufficiency (chronic) (peripheral): Secondary | ICD-10-CM | POA: Diagnosis not present

## 2023-04-22 DIAGNOSIS — F321 Major depressive disorder, single episode, moderate: Secondary | ICD-10-CM | POA: Diagnosis not present

## 2023-04-22 DIAGNOSIS — F419 Anxiety disorder, unspecified: Secondary | ICD-10-CM | POA: Diagnosis not present

## 2023-04-22 DIAGNOSIS — D500 Iron deficiency anemia secondary to blood loss (chronic): Secondary | ICD-10-CM | POA: Diagnosis not present

## 2023-04-22 DIAGNOSIS — L97322 Non-pressure chronic ulcer of left ankle with fat layer exposed: Secondary | ICD-10-CM | POA: Diagnosis not present

## 2023-04-22 DIAGNOSIS — I739 Peripheral vascular disease, unspecified: Secondary | ICD-10-CM | POA: Diagnosis not present

## 2023-04-22 DIAGNOSIS — I10 Essential (primary) hypertension: Secondary | ICD-10-CM | POA: Diagnosis not present

## 2023-04-26 DIAGNOSIS — I700 Atherosclerosis of aorta: Secondary | ICD-10-CM | POA: Diagnosis not present

## 2023-04-26 DIAGNOSIS — I890 Lymphedema, not elsewhere classified: Secondary | ICD-10-CM | POA: Diagnosis not present

## 2023-04-26 DIAGNOSIS — F321 Major depressive disorder, single episode, moderate: Secondary | ICD-10-CM | POA: Diagnosis not present

## 2023-04-26 DIAGNOSIS — I872 Venous insufficiency (chronic) (peripheral): Secondary | ICD-10-CM | POA: Diagnosis not present

## 2023-04-26 DIAGNOSIS — F419 Anxiety disorder, unspecified: Secondary | ICD-10-CM | POA: Diagnosis not present

## 2023-04-26 DIAGNOSIS — D500 Iron deficiency anemia secondary to blood loss (chronic): Secondary | ICD-10-CM | POA: Diagnosis not present

## 2023-04-26 DIAGNOSIS — L97322 Non-pressure chronic ulcer of left ankle with fat layer exposed: Secondary | ICD-10-CM | POA: Diagnosis not present

## 2023-04-26 DIAGNOSIS — I10 Essential (primary) hypertension: Secondary | ICD-10-CM | POA: Diagnosis not present

## 2023-04-26 DIAGNOSIS — I739 Peripheral vascular disease, unspecified: Secondary | ICD-10-CM | POA: Diagnosis not present

## 2023-04-26 DIAGNOSIS — J449 Chronic obstructive pulmonary disease, unspecified: Secondary | ICD-10-CM | POA: Diagnosis not present

## 2023-04-30 DIAGNOSIS — I872 Venous insufficiency (chronic) (peripheral): Secondary | ICD-10-CM | POA: Diagnosis not present

## 2023-04-30 DIAGNOSIS — D500 Iron deficiency anemia secondary to blood loss (chronic): Secondary | ICD-10-CM | POA: Diagnosis not present

## 2023-04-30 DIAGNOSIS — J449 Chronic obstructive pulmonary disease, unspecified: Secondary | ICD-10-CM | POA: Diagnosis not present

## 2023-04-30 DIAGNOSIS — F321 Major depressive disorder, single episode, moderate: Secondary | ICD-10-CM | POA: Diagnosis not present

## 2023-04-30 DIAGNOSIS — I739 Peripheral vascular disease, unspecified: Secondary | ICD-10-CM | POA: Diagnosis not present

## 2023-04-30 DIAGNOSIS — F419 Anxiety disorder, unspecified: Secondary | ICD-10-CM | POA: Diagnosis not present

## 2023-04-30 DIAGNOSIS — I700 Atherosclerosis of aorta: Secondary | ICD-10-CM | POA: Diagnosis not present

## 2023-04-30 DIAGNOSIS — I10 Essential (primary) hypertension: Secondary | ICD-10-CM | POA: Diagnosis not present

## 2023-04-30 DIAGNOSIS — I890 Lymphedema, not elsewhere classified: Secondary | ICD-10-CM | POA: Diagnosis not present

## 2023-04-30 DIAGNOSIS — L97322 Non-pressure chronic ulcer of left ankle with fat layer exposed: Secondary | ICD-10-CM | POA: Diagnosis not present

## 2023-05-03 DIAGNOSIS — L97322 Non-pressure chronic ulcer of left ankle with fat layer exposed: Secondary | ICD-10-CM | POA: Diagnosis not present

## 2023-05-03 DIAGNOSIS — I890 Lymphedema, not elsewhere classified: Secondary | ICD-10-CM | POA: Diagnosis not present

## 2023-05-03 DIAGNOSIS — I739 Peripheral vascular disease, unspecified: Secondary | ICD-10-CM | POA: Diagnosis not present

## 2023-05-03 DIAGNOSIS — I10 Essential (primary) hypertension: Secondary | ICD-10-CM | POA: Diagnosis not present

## 2023-05-03 DIAGNOSIS — I872 Venous insufficiency (chronic) (peripheral): Secondary | ICD-10-CM | POA: Diagnosis not present

## 2023-05-03 DIAGNOSIS — F321 Major depressive disorder, single episode, moderate: Secondary | ICD-10-CM | POA: Diagnosis not present

## 2023-05-03 DIAGNOSIS — F419 Anxiety disorder, unspecified: Secondary | ICD-10-CM | POA: Diagnosis not present

## 2023-05-03 DIAGNOSIS — J449 Chronic obstructive pulmonary disease, unspecified: Secondary | ICD-10-CM | POA: Diagnosis not present

## 2023-05-03 DIAGNOSIS — I700 Atherosclerosis of aorta: Secondary | ICD-10-CM | POA: Diagnosis not present

## 2023-05-03 DIAGNOSIS — D500 Iron deficiency anemia secondary to blood loss (chronic): Secondary | ICD-10-CM | POA: Diagnosis not present

## 2023-05-05 DIAGNOSIS — L97822 Non-pressure chronic ulcer of other part of left lower leg with fat layer exposed: Secondary | ICD-10-CM | POA: Diagnosis not present

## 2023-05-05 DIAGNOSIS — F321 Major depressive disorder, single episode, moderate: Secondary | ICD-10-CM | POA: Diagnosis not present

## 2023-05-05 DIAGNOSIS — I872 Venous insufficiency (chronic) (peripheral): Secondary | ICD-10-CM | POA: Diagnosis not present

## 2023-05-05 DIAGNOSIS — L97818 Non-pressure chronic ulcer of other part of right lower leg with other specified severity: Secondary | ICD-10-CM | POA: Diagnosis not present

## 2023-05-05 DIAGNOSIS — X58XXXD Exposure to other specified factors, subsequent encounter: Secondary | ICD-10-CM | POA: Diagnosis not present

## 2023-05-05 DIAGNOSIS — J449 Chronic obstructive pulmonary disease, unspecified: Secondary | ICD-10-CM | POA: Diagnosis not present

## 2023-05-05 DIAGNOSIS — S91104D Unspecified open wound of right lesser toe(s) without damage to nail, subsequent encounter: Secondary | ICD-10-CM | POA: Diagnosis not present

## 2023-05-05 DIAGNOSIS — I83018 Varicose veins of right lower extremity with ulcer other part of lower leg: Secondary | ICD-10-CM | POA: Diagnosis not present

## 2023-05-05 DIAGNOSIS — S91104A Unspecified open wound of right lesser toe(s) without damage to nail, initial encounter: Secondary | ICD-10-CM | POA: Diagnosis not present

## 2023-05-05 DIAGNOSIS — L97321 Non-pressure chronic ulcer of left ankle limited to breakdown of skin: Secondary | ICD-10-CM | POA: Diagnosis not present

## 2023-05-05 DIAGNOSIS — M4316 Spondylolisthesis, lumbar region: Secondary | ICD-10-CM | POA: Diagnosis not present

## 2023-05-11 DIAGNOSIS — I10 Essential (primary) hypertension: Secondary | ICD-10-CM | POA: Diagnosis not present

## 2023-05-11 DIAGNOSIS — D500 Iron deficiency anemia secondary to blood loss (chronic): Secondary | ICD-10-CM | POA: Diagnosis not present

## 2023-05-11 DIAGNOSIS — L309 Dermatitis, unspecified: Secondary | ICD-10-CM | POA: Diagnosis not present

## 2023-05-11 DIAGNOSIS — L97322 Non-pressure chronic ulcer of left ankle with fat layer exposed: Secondary | ICD-10-CM | POA: Diagnosis not present

## 2023-05-11 DIAGNOSIS — Z79899 Other long term (current) drug therapy: Secondary | ICD-10-CM | POA: Diagnosis not present

## 2023-05-11 DIAGNOSIS — F321 Major depressive disorder, single episode, moderate: Secondary | ICD-10-CM | POA: Diagnosis not present

## 2023-05-11 DIAGNOSIS — I890 Lymphedema, not elsewhere classified: Secondary | ICD-10-CM | POA: Diagnosis not present

## 2023-05-11 DIAGNOSIS — F419 Anxiety disorder, unspecified: Secondary | ICD-10-CM | POA: Diagnosis not present

## 2023-05-11 DIAGNOSIS — I739 Peripheral vascular disease, unspecified: Secondary | ICD-10-CM | POA: Diagnosis not present

## 2023-05-11 DIAGNOSIS — I700 Atherosclerosis of aorta: Secondary | ICD-10-CM | POA: Diagnosis not present

## 2023-05-11 DIAGNOSIS — L0100 Impetigo, unspecified: Secondary | ICD-10-CM | POA: Diagnosis not present

## 2023-05-11 DIAGNOSIS — L280 Lichen simplex chronicus: Secondary | ICD-10-CM | POA: Diagnosis not present

## 2023-05-11 DIAGNOSIS — I872 Venous insufficiency (chronic) (peripheral): Secondary | ICD-10-CM | POA: Diagnosis not present

## 2023-05-11 DIAGNOSIS — J449 Chronic obstructive pulmonary disease, unspecified: Secondary | ICD-10-CM | POA: Diagnosis not present

## 2023-05-17 DIAGNOSIS — I872 Venous insufficiency (chronic) (peripheral): Secondary | ICD-10-CM | POA: Diagnosis not present

## 2023-05-17 DIAGNOSIS — I890 Lymphedema, not elsewhere classified: Secondary | ICD-10-CM | POA: Diagnosis not present

## 2023-05-17 DIAGNOSIS — I700 Atherosclerosis of aorta: Secondary | ICD-10-CM | POA: Diagnosis not present

## 2023-05-17 DIAGNOSIS — L97322 Non-pressure chronic ulcer of left ankle with fat layer exposed: Secondary | ICD-10-CM | POA: Diagnosis not present

## 2023-05-17 DIAGNOSIS — I10 Essential (primary) hypertension: Secondary | ICD-10-CM | POA: Diagnosis not present

## 2023-05-17 DIAGNOSIS — D500 Iron deficiency anemia secondary to blood loss (chronic): Secondary | ICD-10-CM | POA: Diagnosis not present

## 2023-05-17 DIAGNOSIS — F321 Major depressive disorder, single episode, moderate: Secondary | ICD-10-CM | POA: Diagnosis not present

## 2023-05-17 DIAGNOSIS — F419 Anxiety disorder, unspecified: Secondary | ICD-10-CM | POA: Diagnosis not present

## 2023-05-17 DIAGNOSIS — I739 Peripheral vascular disease, unspecified: Secondary | ICD-10-CM | POA: Diagnosis not present

## 2023-05-17 DIAGNOSIS — J449 Chronic obstructive pulmonary disease, unspecified: Secondary | ICD-10-CM | POA: Diagnosis not present

## 2023-05-24 DIAGNOSIS — B351 Tinea unguium: Secondary | ICD-10-CM | POA: Diagnosis not present

## 2023-05-24 DIAGNOSIS — I739 Peripheral vascular disease, unspecified: Secondary | ICD-10-CM | POA: Diagnosis not present

## 2023-05-25 DIAGNOSIS — F419 Anxiety disorder, unspecified: Secondary | ICD-10-CM | POA: Diagnosis not present

## 2023-05-25 DIAGNOSIS — J449 Chronic obstructive pulmonary disease, unspecified: Secondary | ICD-10-CM | POA: Diagnosis not present

## 2023-05-25 DIAGNOSIS — D500 Iron deficiency anemia secondary to blood loss (chronic): Secondary | ICD-10-CM | POA: Diagnosis not present

## 2023-05-25 DIAGNOSIS — Z7982 Long term (current) use of aspirin: Secondary | ICD-10-CM | POA: Diagnosis not present

## 2023-05-25 DIAGNOSIS — M810 Age-related osteoporosis without current pathological fracture: Secondary | ICD-10-CM | POA: Diagnosis not present

## 2023-05-25 DIAGNOSIS — F321 Major depressive disorder, single episode, moderate: Secondary | ICD-10-CM | POA: Diagnosis not present

## 2023-05-25 DIAGNOSIS — I10 Essential (primary) hypertension: Secondary | ICD-10-CM | POA: Diagnosis not present

## 2023-05-25 DIAGNOSIS — I890 Lymphedema, not elsewhere classified: Secondary | ICD-10-CM | POA: Diagnosis not present

## 2023-05-25 DIAGNOSIS — I872 Venous insufficiency (chronic) (peripheral): Secondary | ICD-10-CM | POA: Diagnosis not present

## 2023-05-25 DIAGNOSIS — L97322 Non-pressure chronic ulcer of left ankle with fat layer exposed: Secondary | ICD-10-CM | POA: Diagnosis not present

## 2023-05-25 DIAGNOSIS — Z9181 History of falling: Secondary | ICD-10-CM | POA: Diagnosis not present

## 2023-05-25 DIAGNOSIS — I700 Atherosclerosis of aorta: Secondary | ICD-10-CM | POA: Diagnosis not present

## 2023-05-25 DIAGNOSIS — Z8616 Personal history of COVID-19: Secondary | ICD-10-CM | POA: Diagnosis not present

## 2023-05-25 DIAGNOSIS — I739 Peripheral vascular disease, unspecified: Secondary | ICD-10-CM | POA: Diagnosis not present

## 2023-05-31 DIAGNOSIS — I10 Essential (primary) hypertension: Secondary | ICD-10-CM | POA: Diagnosis not present

## 2023-05-31 DIAGNOSIS — L97322 Non-pressure chronic ulcer of left ankle with fat layer exposed: Secondary | ICD-10-CM | POA: Diagnosis not present

## 2023-05-31 DIAGNOSIS — M810 Age-related osteoporosis without current pathological fracture: Secondary | ICD-10-CM | POA: Diagnosis not present

## 2023-05-31 DIAGNOSIS — I872 Venous insufficiency (chronic) (peripheral): Secondary | ICD-10-CM | POA: Diagnosis not present

## 2023-05-31 DIAGNOSIS — Z8616 Personal history of COVID-19: Secondary | ICD-10-CM | POA: Diagnosis not present

## 2023-05-31 DIAGNOSIS — I700 Atherosclerosis of aorta: Secondary | ICD-10-CM | POA: Diagnosis not present

## 2023-05-31 DIAGNOSIS — J449 Chronic obstructive pulmonary disease, unspecified: Secondary | ICD-10-CM | POA: Diagnosis not present

## 2023-05-31 DIAGNOSIS — I739 Peripheral vascular disease, unspecified: Secondary | ICD-10-CM | POA: Diagnosis not present

## 2023-05-31 DIAGNOSIS — Z9181 History of falling: Secondary | ICD-10-CM | POA: Diagnosis not present

## 2023-05-31 DIAGNOSIS — D500 Iron deficiency anemia secondary to blood loss (chronic): Secondary | ICD-10-CM | POA: Diagnosis not present

## 2023-05-31 DIAGNOSIS — I890 Lymphedema, not elsewhere classified: Secondary | ICD-10-CM | POA: Diagnosis not present

## 2023-05-31 DIAGNOSIS — Z7982 Long term (current) use of aspirin: Secondary | ICD-10-CM | POA: Diagnosis not present

## 2023-05-31 DIAGNOSIS — F419 Anxiety disorder, unspecified: Secondary | ICD-10-CM | POA: Diagnosis not present

## 2023-05-31 DIAGNOSIS — F321 Major depressive disorder, single episode, moderate: Secondary | ICD-10-CM | POA: Diagnosis not present

## 2023-06-08 DIAGNOSIS — I10 Essential (primary) hypertension: Secondary | ICD-10-CM | POA: Diagnosis not present

## 2023-06-08 DIAGNOSIS — F419 Anxiety disorder, unspecified: Secondary | ICD-10-CM | POA: Diagnosis not present

## 2023-06-08 DIAGNOSIS — I872 Venous insufficiency (chronic) (peripheral): Secondary | ICD-10-CM | POA: Diagnosis not present

## 2023-06-08 DIAGNOSIS — I739 Peripheral vascular disease, unspecified: Secondary | ICD-10-CM | POA: Diagnosis not present

## 2023-06-08 DIAGNOSIS — Z9181 History of falling: Secondary | ICD-10-CM | POA: Diagnosis not present

## 2023-06-08 DIAGNOSIS — I700 Atherosclerosis of aorta: Secondary | ICD-10-CM | POA: Diagnosis not present

## 2023-06-08 DIAGNOSIS — D500 Iron deficiency anemia secondary to blood loss (chronic): Secondary | ICD-10-CM | POA: Diagnosis not present

## 2023-06-08 DIAGNOSIS — M810 Age-related osteoporosis without current pathological fracture: Secondary | ICD-10-CM | POA: Diagnosis not present

## 2023-06-08 DIAGNOSIS — L97322 Non-pressure chronic ulcer of left ankle with fat layer exposed: Secondary | ICD-10-CM | POA: Diagnosis not present

## 2023-06-08 DIAGNOSIS — I890 Lymphedema, not elsewhere classified: Secondary | ICD-10-CM | POA: Diagnosis not present

## 2023-06-08 DIAGNOSIS — F321 Major depressive disorder, single episode, moderate: Secondary | ICD-10-CM | POA: Diagnosis not present

## 2023-06-08 DIAGNOSIS — J449 Chronic obstructive pulmonary disease, unspecified: Secondary | ICD-10-CM | POA: Diagnosis not present

## 2023-06-08 DIAGNOSIS — Z7982 Long term (current) use of aspirin: Secondary | ICD-10-CM | POA: Diagnosis not present

## 2023-06-08 DIAGNOSIS — Z8616 Personal history of COVID-19: Secondary | ICD-10-CM | POA: Diagnosis not present

## 2023-06-14 DIAGNOSIS — I10 Essential (primary) hypertension: Secondary | ICD-10-CM | POA: Diagnosis not present

## 2023-06-14 DIAGNOSIS — I739 Peripheral vascular disease, unspecified: Secondary | ICD-10-CM | POA: Diagnosis not present

## 2023-06-14 DIAGNOSIS — I700 Atherosclerosis of aorta: Secondary | ICD-10-CM | POA: Diagnosis not present

## 2023-06-14 DIAGNOSIS — M810 Age-related osteoporosis without current pathological fracture: Secondary | ICD-10-CM | POA: Diagnosis not present

## 2023-06-14 DIAGNOSIS — Z7982 Long term (current) use of aspirin: Secondary | ICD-10-CM | POA: Diagnosis not present

## 2023-06-14 DIAGNOSIS — L97322 Non-pressure chronic ulcer of left ankle with fat layer exposed: Secondary | ICD-10-CM | POA: Diagnosis not present

## 2023-06-14 DIAGNOSIS — I890 Lymphedema, not elsewhere classified: Secondary | ICD-10-CM | POA: Diagnosis not present

## 2023-06-14 DIAGNOSIS — I872 Venous insufficiency (chronic) (peripheral): Secondary | ICD-10-CM | POA: Diagnosis not present

## 2023-06-14 DIAGNOSIS — J449 Chronic obstructive pulmonary disease, unspecified: Secondary | ICD-10-CM | POA: Diagnosis not present

## 2023-06-14 DIAGNOSIS — Z9181 History of falling: Secondary | ICD-10-CM | POA: Diagnosis not present

## 2023-06-14 DIAGNOSIS — D500 Iron deficiency anemia secondary to blood loss (chronic): Secondary | ICD-10-CM | POA: Diagnosis not present

## 2023-06-14 DIAGNOSIS — Z8616 Personal history of COVID-19: Secondary | ICD-10-CM | POA: Diagnosis not present

## 2023-06-14 DIAGNOSIS — F419 Anxiety disorder, unspecified: Secondary | ICD-10-CM | POA: Diagnosis not present

## 2023-06-14 DIAGNOSIS — F321 Major depressive disorder, single episode, moderate: Secondary | ICD-10-CM | POA: Diagnosis not present

## 2023-06-22 DIAGNOSIS — Z8616 Personal history of COVID-19: Secondary | ICD-10-CM | POA: Diagnosis not present

## 2023-06-22 DIAGNOSIS — Z9181 History of falling: Secondary | ICD-10-CM | POA: Diagnosis not present

## 2023-06-22 DIAGNOSIS — F419 Anxiety disorder, unspecified: Secondary | ICD-10-CM | POA: Diagnosis not present

## 2023-06-22 DIAGNOSIS — I700 Atherosclerosis of aorta: Secondary | ICD-10-CM | POA: Diagnosis not present

## 2023-06-22 DIAGNOSIS — I10 Essential (primary) hypertension: Secondary | ICD-10-CM | POA: Diagnosis not present

## 2023-06-22 DIAGNOSIS — I872 Venous insufficiency (chronic) (peripheral): Secondary | ICD-10-CM | POA: Diagnosis not present

## 2023-06-22 DIAGNOSIS — M810 Age-related osteoporosis without current pathological fracture: Secondary | ICD-10-CM | POA: Diagnosis not present

## 2023-06-22 DIAGNOSIS — I890 Lymphedema, not elsewhere classified: Secondary | ICD-10-CM | POA: Diagnosis not present

## 2023-06-22 DIAGNOSIS — Z7982 Long term (current) use of aspirin: Secondary | ICD-10-CM | POA: Diagnosis not present

## 2023-06-22 DIAGNOSIS — I739 Peripheral vascular disease, unspecified: Secondary | ICD-10-CM | POA: Diagnosis not present

## 2023-06-22 DIAGNOSIS — D500 Iron deficiency anemia secondary to blood loss (chronic): Secondary | ICD-10-CM | POA: Diagnosis not present

## 2023-06-22 DIAGNOSIS — L97322 Non-pressure chronic ulcer of left ankle with fat layer exposed: Secondary | ICD-10-CM | POA: Diagnosis not present

## 2023-06-22 DIAGNOSIS — J449 Chronic obstructive pulmonary disease, unspecified: Secondary | ICD-10-CM | POA: Diagnosis not present

## 2023-06-22 DIAGNOSIS — F321 Major depressive disorder, single episode, moderate: Secondary | ICD-10-CM | POA: Diagnosis not present

## 2023-06-24 DIAGNOSIS — Z681 Body mass index (BMI) 19 or less, adult: Secondary | ICD-10-CM | POA: Diagnosis not present

## 2023-06-24 DIAGNOSIS — L03116 Cellulitis of left lower limb: Secondary | ICD-10-CM | POA: Diagnosis not present

## 2023-06-24 DIAGNOSIS — R030 Elevated blood-pressure reading, without diagnosis of hypertension: Secondary | ICD-10-CM | POA: Diagnosis not present

## 2023-06-24 DIAGNOSIS — I872 Venous insufficiency (chronic) (peripheral): Secondary | ICD-10-CM | POA: Diagnosis not present

## 2023-06-28 DIAGNOSIS — L97322 Non-pressure chronic ulcer of left ankle with fat layer exposed: Secondary | ICD-10-CM | POA: Diagnosis not present

## 2023-06-28 DIAGNOSIS — F321 Major depressive disorder, single episode, moderate: Secondary | ICD-10-CM | POA: Diagnosis not present

## 2023-06-28 DIAGNOSIS — I10 Essential (primary) hypertension: Secondary | ICD-10-CM | POA: Diagnosis not present

## 2023-06-28 DIAGNOSIS — I739 Peripheral vascular disease, unspecified: Secondary | ICD-10-CM | POA: Diagnosis not present

## 2023-06-28 DIAGNOSIS — I872 Venous insufficiency (chronic) (peripheral): Secondary | ICD-10-CM | POA: Diagnosis not present

## 2023-06-28 DIAGNOSIS — F419 Anxiety disorder, unspecified: Secondary | ICD-10-CM | POA: Diagnosis not present

## 2023-06-28 DIAGNOSIS — Z8616 Personal history of COVID-19: Secondary | ICD-10-CM | POA: Diagnosis not present

## 2023-06-28 DIAGNOSIS — D500 Iron deficiency anemia secondary to blood loss (chronic): Secondary | ICD-10-CM | POA: Diagnosis not present

## 2023-06-28 DIAGNOSIS — M810 Age-related osteoporosis without current pathological fracture: Secondary | ICD-10-CM | POA: Diagnosis not present

## 2023-06-28 DIAGNOSIS — I700 Atherosclerosis of aorta: Secondary | ICD-10-CM | POA: Diagnosis not present

## 2023-06-28 DIAGNOSIS — Z7982 Long term (current) use of aspirin: Secondary | ICD-10-CM | POA: Diagnosis not present

## 2023-06-28 DIAGNOSIS — I890 Lymphedema, not elsewhere classified: Secondary | ICD-10-CM | POA: Diagnosis not present

## 2023-06-28 DIAGNOSIS — J449 Chronic obstructive pulmonary disease, unspecified: Secondary | ICD-10-CM | POA: Diagnosis not present

## 2023-06-28 DIAGNOSIS — Z9181 History of falling: Secondary | ICD-10-CM | POA: Diagnosis not present

## 2023-06-30 DIAGNOSIS — I872 Venous insufficiency (chronic) (peripheral): Secondary | ICD-10-CM | POA: Diagnosis not present

## 2023-06-30 DIAGNOSIS — K219 Gastro-esophageal reflux disease without esophagitis: Secondary | ICD-10-CM | POA: Diagnosis not present

## 2023-06-30 DIAGNOSIS — I10 Essential (primary) hypertension: Secondary | ICD-10-CM | POA: Diagnosis not present

## 2023-06-30 DIAGNOSIS — J449 Chronic obstructive pulmonary disease, unspecified: Secondary | ICD-10-CM | POA: Diagnosis not present

## 2023-06-30 DIAGNOSIS — L97421 Non-pressure chronic ulcer of left heel and midfoot limited to breakdown of skin: Secondary | ICD-10-CM | POA: Diagnosis not present

## 2023-06-30 DIAGNOSIS — Z87891 Personal history of nicotine dependence: Secondary | ICD-10-CM | POA: Diagnosis not present

## 2023-06-30 DIAGNOSIS — L97321 Non-pressure chronic ulcer of left ankle limited to breakdown of skin: Secondary | ICD-10-CM | POA: Diagnosis not present

## 2023-07-06 DIAGNOSIS — M810 Age-related osteoporosis without current pathological fracture: Secondary | ICD-10-CM | POA: Diagnosis not present

## 2023-07-06 DIAGNOSIS — D500 Iron deficiency anemia secondary to blood loss (chronic): Secondary | ICD-10-CM | POA: Diagnosis not present

## 2023-07-06 DIAGNOSIS — I739 Peripheral vascular disease, unspecified: Secondary | ICD-10-CM | POA: Diagnosis not present

## 2023-07-06 DIAGNOSIS — Z8616 Personal history of COVID-19: Secondary | ICD-10-CM | POA: Diagnosis not present

## 2023-07-06 DIAGNOSIS — F321 Major depressive disorder, single episode, moderate: Secondary | ICD-10-CM | POA: Diagnosis not present

## 2023-07-06 DIAGNOSIS — I872 Venous insufficiency (chronic) (peripheral): Secondary | ICD-10-CM | POA: Diagnosis not present

## 2023-07-06 DIAGNOSIS — I700 Atherosclerosis of aorta: Secondary | ICD-10-CM | POA: Diagnosis not present

## 2023-07-06 DIAGNOSIS — Z7982 Long term (current) use of aspirin: Secondary | ICD-10-CM | POA: Diagnosis not present

## 2023-07-06 DIAGNOSIS — I890 Lymphedema, not elsewhere classified: Secondary | ICD-10-CM | POA: Diagnosis not present

## 2023-07-06 DIAGNOSIS — J449 Chronic obstructive pulmonary disease, unspecified: Secondary | ICD-10-CM | POA: Diagnosis not present

## 2023-07-06 DIAGNOSIS — L97322 Non-pressure chronic ulcer of left ankle with fat layer exposed: Secondary | ICD-10-CM | POA: Diagnosis not present

## 2023-07-06 DIAGNOSIS — F419 Anxiety disorder, unspecified: Secondary | ICD-10-CM | POA: Diagnosis not present

## 2023-07-06 DIAGNOSIS — I10 Essential (primary) hypertension: Secondary | ICD-10-CM | POA: Diagnosis not present

## 2023-07-06 DIAGNOSIS — Z9181 History of falling: Secondary | ICD-10-CM | POA: Diagnosis not present

## 2023-07-12 DIAGNOSIS — D500 Iron deficiency anemia secondary to blood loss (chronic): Secondary | ICD-10-CM | POA: Diagnosis not present

## 2023-07-12 DIAGNOSIS — I739 Peripheral vascular disease, unspecified: Secondary | ICD-10-CM | POA: Diagnosis not present

## 2023-07-12 DIAGNOSIS — Z9181 History of falling: Secondary | ICD-10-CM | POA: Diagnosis not present

## 2023-07-12 DIAGNOSIS — M810 Age-related osteoporosis without current pathological fracture: Secondary | ICD-10-CM | POA: Diagnosis not present

## 2023-07-12 DIAGNOSIS — F321 Major depressive disorder, single episode, moderate: Secondary | ICD-10-CM | POA: Diagnosis not present

## 2023-07-12 DIAGNOSIS — Z8616 Personal history of COVID-19: Secondary | ICD-10-CM | POA: Diagnosis not present

## 2023-07-12 DIAGNOSIS — I700 Atherosclerosis of aorta: Secondary | ICD-10-CM | POA: Diagnosis not present

## 2023-07-12 DIAGNOSIS — I890 Lymphedema, not elsewhere classified: Secondary | ICD-10-CM | POA: Diagnosis not present

## 2023-07-12 DIAGNOSIS — Z7982 Long term (current) use of aspirin: Secondary | ICD-10-CM | POA: Diagnosis not present

## 2023-07-12 DIAGNOSIS — F419 Anxiety disorder, unspecified: Secondary | ICD-10-CM | POA: Diagnosis not present

## 2023-07-12 DIAGNOSIS — I872 Venous insufficiency (chronic) (peripheral): Secondary | ICD-10-CM | POA: Diagnosis not present

## 2023-07-12 DIAGNOSIS — I10 Essential (primary) hypertension: Secondary | ICD-10-CM | POA: Diagnosis not present

## 2023-07-12 DIAGNOSIS — J449 Chronic obstructive pulmonary disease, unspecified: Secondary | ICD-10-CM | POA: Diagnosis not present

## 2023-07-12 DIAGNOSIS — L97322 Non-pressure chronic ulcer of left ankle with fat layer exposed: Secondary | ICD-10-CM | POA: Diagnosis not present

## 2023-07-15 DIAGNOSIS — I700 Atherosclerosis of aorta: Secondary | ICD-10-CM | POA: Diagnosis not present

## 2023-07-15 DIAGNOSIS — Z7982 Long term (current) use of aspirin: Secondary | ICD-10-CM | POA: Diagnosis not present

## 2023-07-15 DIAGNOSIS — I739 Peripheral vascular disease, unspecified: Secondary | ICD-10-CM | POA: Diagnosis not present

## 2023-07-15 DIAGNOSIS — F419 Anxiety disorder, unspecified: Secondary | ICD-10-CM | POA: Diagnosis not present

## 2023-07-15 DIAGNOSIS — D500 Iron deficiency anemia secondary to blood loss (chronic): Secondary | ICD-10-CM | POA: Diagnosis not present

## 2023-07-15 DIAGNOSIS — I10 Essential (primary) hypertension: Secondary | ICD-10-CM | POA: Diagnosis not present

## 2023-07-15 DIAGNOSIS — F321 Major depressive disorder, single episode, moderate: Secondary | ICD-10-CM | POA: Diagnosis not present

## 2023-07-15 DIAGNOSIS — I890 Lymphedema, not elsewhere classified: Secondary | ICD-10-CM | POA: Diagnosis not present

## 2023-07-15 DIAGNOSIS — J449 Chronic obstructive pulmonary disease, unspecified: Secondary | ICD-10-CM | POA: Diagnosis not present

## 2023-07-15 DIAGNOSIS — Z9181 History of falling: Secondary | ICD-10-CM | POA: Diagnosis not present

## 2023-07-15 DIAGNOSIS — M810 Age-related osteoporosis without current pathological fracture: Secondary | ICD-10-CM | POA: Diagnosis not present

## 2023-07-15 DIAGNOSIS — I872 Venous insufficiency (chronic) (peripheral): Secondary | ICD-10-CM | POA: Diagnosis not present

## 2023-07-15 DIAGNOSIS — Z8616 Personal history of COVID-19: Secondary | ICD-10-CM | POA: Diagnosis not present

## 2023-07-15 DIAGNOSIS — L97322 Non-pressure chronic ulcer of left ankle with fat layer exposed: Secondary | ICD-10-CM | POA: Diagnosis not present

## 2023-07-20 DIAGNOSIS — F419 Anxiety disorder, unspecified: Secondary | ICD-10-CM | POA: Diagnosis not present

## 2023-07-20 DIAGNOSIS — Z8616 Personal history of COVID-19: Secondary | ICD-10-CM | POA: Diagnosis not present

## 2023-07-20 DIAGNOSIS — I700 Atherosclerosis of aorta: Secondary | ICD-10-CM | POA: Diagnosis not present

## 2023-07-20 DIAGNOSIS — I872 Venous insufficiency (chronic) (peripheral): Secondary | ICD-10-CM | POA: Diagnosis not present

## 2023-07-20 DIAGNOSIS — I890 Lymphedema, not elsewhere classified: Secondary | ICD-10-CM | POA: Diagnosis not present

## 2023-07-20 DIAGNOSIS — M810 Age-related osteoporosis without current pathological fracture: Secondary | ICD-10-CM | POA: Diagnosis not present

## 2023-07-20 DIAGNOSIS — I10 Essential (primary) hypertension: Secondary | ICD-10-CM | POA: Diagnosis not present

## 2023-07-20 DIAGNOSIS — F321 Major depressive disorder, single episode, moderate: Secondary | ICD-10-CM | POA: Diagnosis not present

## 2023-07-20 DIAGNOSIS — I739 Peripheral vascular disease, unspecified: Secondary | ICD-10-CM | POA: Diagnosis not present

## 2023-07-20 DIAGNOSIS — J449 Chronic obstructive pulmonary disease, unspecified: Secondary | ICD-10-CM | POA: Diagnosis not present

## 2023-07-20 DIAGNOSIS — Z7982 Long term (current) use of aspirin: Secondary | ICD-10-CM | POA: Diagnosis not present

## 2023-07-20 DIAGNOSIS — D500 Iron deficiency anemia secondary to blood loss (chronic): Secondary | ICD-10-CM | POA: Diagnosis not present

## 2023-07-20 DIAGNOSIS — L97322 Non-pressure chronic ulcer of left ankle with fat layer exposed: Secondary | ICD-10-CM | POA: Diagnosis not present

## 2023-07-21 DIAGNOSIS — M810 Age-related osteoporosis without current pathological fracture: Secondary | ICD-10-CM | POA: Diagnosis not present

## 2023-07-21 DIAGNOSIS — Z8616 Personal history of COVID-19: Secondary | ICD-10-CM | POA: Diagnosis not present

## 2023-07-21 DIAGNOSIS — Z87891 Personal history of nicotine dependence: Secondary | ICD-10-CM | POA: Diagnosis not present

## 2023-07-21 DIAGNOSIS — Z880 Allergy status to penicillin: Secondary | ICD-10-CM | POA: Diagnosis not present

## 2023-07-21 DIAGNOSIS — M1990 Unspecified osteoarthritis, unspecified site: Secondary | ICD-10-CM | POA: Diagnosis not present

## 2023-07-21 DIAGNOSIS — I10 Essential (primary) hypertension: Secondary | ICD-10-CM | POA: Diagnosis not present

## 2023-07-21 DIAGNOSIS — S91101D Unspecified open wound of right great toe without damage to nail, subsequent encounter: Secondary | ICD-10-CM | POA: Diagnosis not present

## 2023-07-21 DIAGNOSIS — Z79899 Other long term (current) drug therapy: Secondary | ICD-10-CM | POA: Diagnosis not present

## 2023-07-21 DIAGNOSIS — Z881 Allergy status to other antibiotic agents status: Secondary | ICD-10-CM | POA: Diagnosis not present

## 2023-07-21 DIAGNOSIS — Z7982 Long term (current) use of aspirin: Secondary | ICD-10-CM | POA: Diagnosis not present

## 2023-07-21 DIAGNOSIS — L97919 Non-pressure chronic ulcer of unspecified part of right lower leg with unspecified severity: Secondary | ICD-10-CM | POA: Diagnosis not present

## 2023-07-21 DIAGNOSIS — I83019 Varicose veins of right lower extremity with ulcer of unspecified site: Secondary | ICD-10-CM | POA: Diagnosis not present

## 2023-07-21 DIAGNOSIS — X58XXXD Exposure to other specified factors, subsequent encounter: Secondary | ICD-10-CM | POA: Diagnosis not present

## 2023-07-21 DIAGNOSIS — X58XXXA Exposure to other specified factors, initial encounter: Secondary | ICD-10-CM | POA: Diagnosis not present

## 2023-07-21 DIAGNOSIS — L03116 Cellulitis of left lower limb: Secondary | ICD-10-CM | POA: Diagnosis not present

## 2023-07-21 DIAGNOSIS — S91101A Unspecified open wound of right great toe without damage to nail, initial encounter: Secondary | ICD-10-CM | POA: Diagnosis not present

## 2023-07-21 DIAGNOSIS — K219 Gastro-esophageal reflux disease without esophagitis: Secondary | ICD-10-CM | POA: Diagnosis not present

## 2023-07-21 DIAGNOSIS — F32A Depression, unspecified: Secondary | ICD-10-CM | POA: Diagnosis not present

## 2023-07-22 DIAGNOSIS — F321 Major depressive disorder, single episode, moderate: Secondary | ICD-10-CM | POA: Diagnosis not present

## 2023-07-22 DIAGNOSIS — D500 Iron deficiency anemia secondary to blood loss (chronic): Secondary | ICD-10-CM | POA: Diagnosis not present

## 2023-07-22 DIAGNOSIS — I872 Venous insufficiency (chronic) (peripheral): Secondary | ICD-10-CM | POA: Diagnosis not present

## 2023-07-22 DIAGNOSIS — Z7982 Long term (current) use of aspirin: Secondary | ICD-10-CM | POA: Diagnosis not present

## 2023-07-22 DIAGNOSIS — Z8616 Personal history of COVID-19: Secondary | ICD-10-CM | POA: Diagnosis not present

## 2023-07-22 DIAGNOSIS — I700 Atherosclerosis of aorta: Secondary | ICD-10-CM | POA: Diagnosis not present

## 2023-07-22 DIAGNOSIS — F419 Anxiety disorder, unspecified: Secondary | ICD-10-CM | POA: Diagnosis not present

## 2023-07-22 DIAGNOSIS — J449 Chronic obstructive pulmonary disease, unspecified: Secondary | ICD-10-CM | POA: Diagnosis not present

## 2023-07-22 DIAGNOSIS — I739 Peripheral vascular disease, unspecified: Secondary | ICD-10-CM | POA: Diagnosis not present

## 2023-07-22 DIAGNOSIS — I10 Essential (primary) hypertension: Secondary | ICD-10-CM | POA: Diagnosis not present

## 2023-07-22 DIAGNOSIS — I890 Lymphedema, not elsewhere classified: Secondary | ICD-10-CM | POA: Diagnosis not present

## 2023-07-22 DIAGNOSIS — L97322 Non-pressure chronic ulcer of left ankle with fat layer exposed: Secondary | ICD-10-CM | POA: Diagnosis not present

## 2023-07-22 DIAGNOSIS — M810 Age-related osteoporosis without current pathological fracture: Secondary | ICD-10-CM | POA: Diagnosis not present

## 2023-07-26 DIAGNOSIS — J449 Chronic obstructive pulmonary disease, unspecified: Secondary | ICD-10-CM | POA: Diagnosis not present

## 2023-07-26 DIAGNOSIS — Z7982 Long term (current) use of aspirin: Secondary | ICD-10-CM | POA: Diagnosis not present

## 2023-07-26 DIAGNOSIS — L97322 Non-pressure chronic ulcer of left ankle with fat layer exposed: Secondary | ICD-10-CM | POA: Diagnosis not present

## 2023-07-26 DIAGNOSIS — M810 Age-related osteoporosis without current pathological fracture: Secondary | ICD-10-CM | POA: Diagnosis not present

## 2023-07-26 DIAGNOSIS — I10 Essential (primary) hypertension: Secondary | ICD-10-CM | POA: Diagnosis not present

## 2023-07-26 DIAGNOSIS — Z8616 Personal history of COVID-19: Secondary | ICD-10-CM | POA: Diagnosis not present

## 2023-07-26 DIAGNOSIS — I872 Venous insufficiency (chronic) (peripheral): Secondary | ICD-10-CM | POA: Diagnosis not present

## 2023-07-26 DIAGNOSIS — I700 Atherosclerosis of aorta: Secondary | ICD-10-CM | POA: Diagnosis not present

## 2023-07-26 DIAGNOSIS — F419 Anxiety disorder, unspecified: Secondary | ICD-10-CM | POA: Diagnosis not present

## 2023-07-26 DIAGNOSIS — D500 Iron deficiency anemia secondary to blood loss (chronic): Secondary | ICD-10-CM | POA: Diagnosis not present

## 2023-07-26 DIAGNOSIS — I890 Lymphedema, not elsewhere classified: Secondary | ICD-10-CM | POA: Diagnosis not present

## 2023-07-26 DIAGNOSIS — I739 Peripheral vascular disease, unspecified: Secondary | ICD-10-CM | POA: Diagnosis not present

## 2023-07-26 DIAGNOSIS — F321 Major depressive disorder, single episode, moderate: Secondary | ICD-10-CM | POA: Diagnosis not present

## 2023-07-29 DIAGNOSIS — L03116 Cellulitis of left lower limb: Secondary | ICD-10-CM | POA: Diagnosis not present

## 2023-07-29 DIAGNOSIS — Z881 Allergy status to other antibiotic agents status: Secondary | ICD-10-CM | POA: Diagnosis not present

## 2023-07-29 DIAGNOSIS — D500 Iron deficiency anemia secondary to blood loss (chronic): Secondary | ICD-10-CM | POA: Diagnosis not present

## 2023-07-29 DIAGNOSIS — Z880 Allergy status to penicillin: Secondary | ICD-10-CM | POA: Diagnosis not present

## 2023-07-29 DIAGNOSIS — Z8616 Personal history of COVID-19: Secondary | ICD-10-CM | POA: Diagnosis not present

## 2023-07-29 DIAGNOSIS — L97919 Non-pressure chronic ulcer of unspecified part of right lower leg with unspecified severity: Secondary | ICD-10-CM | POA: Diagnosis not present

## 2023-07-29 DIAGNOSIS — X58XXXD Exposure to other specified factors, subsequent encounter: Secondary | ICD-10-CM | POA: Diagnosis not present

## 2023-07-29 DIAGNOSIS — M1990 Unspecified osteoarthritis, unspecified site: Secondary | ICD-10-CM | POA: Diagnosis not present

## 2023-07-29 DIAGNOSIS — M810 Age-related osteoporosis without current pathological fracture: Secondary | ICD-10-CM | POA: Diagnosis not present

## 2023-07-29 DIAGNOSIS — I83019 Varicose veins of right lower extremity with ulcer of unspecified site: Secondary | ICD-10-CM | POA: Diagnosis not present

## 2023-07-29 DIAGNOSIS — Z7982 Long term (current) use of aspirin: Secondary | ICD-10-CM | POA: Diagnosis not present

## 2023-07-29 DIAGNOSIS — F419 Anxiety disorder, unspecified: Secondary | ICD-10-CM | POA: Diagnosis not present

## 2023-07-29 DIAGNOSIS — I10 Essential (primary) hypertension: Secondary | ICD-10-CM | POA: Diagnosis not present

## 2023-07-29 DIAGNOSIS — J449 Chronic obstructive pulmonary disease, unspecified: Secondary | ICD-10-CM | POA: Diagnosis not present

## 2023-07-29 DIAGNOSIS — I872 Venous insufficiency (chronic) (peripheral): Secondary | ICD-10-CM | POA: Diagnosis not present

## 2023-07-29 DIAGNOSIS — F321 Major depressive disorder, single episode, moderate: Secondary | ICD-10-CM | POA: Diagnosis not present

## 2023-07-29 DIAGNOSIS — Z87891 Personal history of nicotine dependence: Secondary | ICD-10-CM | POA: Diagnosis not present

## 2023-07-29 DIAGNOSIS — I739 Peripheral vascular disease, unspecified: Secondary | ICD-10-CM | POA: Diagnosis not present

## 2023-07-29 DIAGNOSIS — K219 Gastro-esophageal reflux disease without esophagitis: Secondary | ICD-10-CM | POA: Diagnosis not present

## 2023-07-29 DIAGNOSIS — L97322 Non-pressure chronic ulcer of left ankle with fat layer exposed: Secondary | ICD-10-CM | POA: Diagnosis not present

## 2023-07-29 DIAGNOSIS — F32A Depression, unspecified: Secondary | ICD-10-CM | POA: Diagnosis not present

## 2023-07-29 DIAGNOSIS — I890 Lymphedema, not elsewhere classified: Secondary | ICD-10-CM | POA: Diagnosis not present

## 2023-07-29 DIAGNOSIS — I700 Atherosclerosis of aorta: Secondary | ICD-10-CM | POA: Diagnosis not present

## 2023-07-29 DIAGNOSIS — Z79899 Other long term (current) drug therapy: Secondary | ICD-10-CM | POA: Diagnosis not present

## 2023-07-29 DIAGNOSIS — S91101D Unspecified open wound of right great toe without damage to nail, subsequent encounter: Secondary | ICD-10-CM | POA: Diagnosis not present

## 2023-08-02 DIAGNOSIS — M79675 Pain in left toe(s): Secondary | ICD-10-CM | POA: Diagnosis not present

## 2023-08-02 DIAGNOSIS — L851 Acquired keratosis [keratoderma] palmaris et plantaris: Secondary | ICD-10-CM | POA: Diagnosis not present

## 2023-08-02 DIAGNOSIS — I739 Peripheral vascular disease, unspecified: Secondary | ICD-10-CM | POA: Diagnosis not present

## 2023-08-02 DIAGNOSIS — M79674 Pain in right toe(s): Secondary | ICD-10-CM | POA: Diagnosis not present

## 2023-08-02 DIAGNOSIS — B351 Tinea unguium: Secondary | ICD-10-CM | POA: Diagnosis not present

## 2023-08-03 DIAGNOSIS — I700 Atherosclerosis of aorta: Secondary | ICD-10-CM | POA: Diagnosis not present

## 2023-08-03 DIAGNOSIS — J449 Chronic obstructive pulmonary disease, unspecified: Secondary | ICD-10-CM | POA: Diagnosis not present

## 2023-08-03 DIAGNOSIS — Z8616 Personal history of COVID-19: Secondary | ICD-10-CM | POA: Diagnosis not present

## 2023-08-03 DIAGNOSIS — L97322 Non-pressure chronic ulcer of left ankle with fat layer exposed: Secondary | ICD-10-CM | POA: Diagnosis not present

## 2023-08-03 DIAGNOSIS — I739 Peripheral vascular disease, unspecified: Secondary | ICD-10-CM | POA: Diagnosis not present

## 2023-08-03 DIAGNOSIS — I890 Lymphedema, not elsewhere classified: Secondary | ICD-10-CM | POA: Diagnosis not present

## 2023-08-03 DIAGNOSIS — M810 Age-related osteoporosis without current pathological fracture: Secondary | ICD-10-CM | POA: Diagnosis not present

## 2023-08-03 DIAGNOSIS — I10 Essential (primary) hypertension: Secondary | ICD-10-CM | POA: Diagnosis not present

## 2023-08-03 DIAGNOSIS — F419 Anxiety disorder, unspecified: Secondary | ICD-10-CM | POA: Diagnosis not present

## 2023-08-03 DIAGNOSIS — Z7982 Long term (current) use of aspirin: Secondary | ICD-10-CM | POA: Diagnosis not present

## 2023-08-03 DIAGNOSIS — F321 Major depressive disorder, single episode, moderate: Secondary | ICD-10-CM | POA: Diagnosis not present

## 2023-08-03 DIAGNOSIS — D500 Iron deficiency anemia secondary to blood loss (chronic): Secondary | ICD-10-CM | POA: Diagnosis not present

## 2023-08-03 DIAGNOSIS — I872 Venous insufficiency (chronic) (peripheral): Secondary | ICD-10-CM | POA: Diagnosis not present

## 2023-08-04 DIAGNOSIS — M1990 Unspecified osteoarthritis, unspecified site: Secondary | ICD-10-CM | POA: Diagnosis not present

## 2023-08-04 DIAGNOSIS — L03116 Cellulitis of left lower limb: Secondary | ICD-10-CM | POA: Diagnosis not present

## 2023-08-04 DIAGNOSIS — I10 Essential (primary) hypertension: Secondary | ICD-10-CM | POA: Diagnosis not present

## 2023-08-04 DIAGNOSIS — L97919 Non-pressure chronic ulcer of unspecified part of right lower leg with unspecified severity: Secondary | ICD-10-CM | POA: Diagnosis not present

## 2023-08-04 DIAGNOSIS — S91101D Unspecified open wound of right great toe without damage to nail, subsequent encounter: Secondary | ICD-10-CM | POA: Diagnosis not present

## 2023-08-04 DIAGNOSIS — K219 Gastro-esophageal reflux disease without esophagitis: Secondary | ICD-10-CM | POA: Diagnosis not present

## 2023-08-04 DIAGNOSIS — Z79899 Other long term (current) drug therapy: Secondary | ICD-10-CM | POA: Diagnosis not present

## 2023-08-04 DIAGNOSIS — X58XXXD Exposure to other specified factors, subsequent encounter: Secondary | ICD-10-CM | POA: Diagnosis not present

## 2023-08-04 DIAGNOSIS — Z8616 Personal history of COVID-19: Secondary | ICD-10-CM | POA: Diagnosis not present

## 2023-08-04 DIAGNOSIS — Z881 Allergy status to other antibiotic agents status: Secondary | ICD-10-CM | POA: Diagnosis not present

## 2023-08-04 DIAGNOSIS — Z880 Allergy status to penicillin: Secondary | ICD-10-CM | POA: Diagnosis not present

## 2023-08-04 DIAGNOSIS — M810 Age-related osteoporosis without current pathological fracture: Secondary | ICD-10-CM | POA: Diagnosis not present

## 2023-08-04 DIAGNOSIS — I83019 Varicose veins of right lower extremity with ulcer of unspecified site: Secondary | ICD-10-CM | POA: Diagnosis not present

## 2023-08-04 DIAGNOSIS — Z87891 Personal history of nicotine dependence: Secondary | ICD-10-CM | POA: Diagnosis not present

## 2023-08-04 DIAGNOSIS — F32A Depression, unspecified: Secondary | ICD-10-CM | POA: Diagnosis not present

## 2023-08-04 DIAGNOSIS — Z7982 Long term (current) use of aspirin: Secondary | ICD-10-CM | POA: Diagnosis not present

## 2023-08-05 DIAGNOSIS — I890 Lymphedema, not elsewhere classified: Secondary | ICD-10-CM | POA: Diagnosis not present

## 2023-08-05 DIAGNOSIS — J449 Chronic obstructive pulmonary disease, unspecified: Secondary | ICD-10-CM | POA: Diagnosis not present

## 2023-08-05 DIAGNOSIS — F419 Anxiety disorder, unspecified: Secondary | ICD-10-CM | POA: Diagnosis not present

## 2023-08-05 DIAGNOSIS — F321 Major depressive disorder, single episode, moderate: Secondary | ICD-10-CM | POA: Diagnosis not present

## 2023-08-05 DIAGNOSIS — Z8616 Personal history of COVID-19: Secondary | ICD-10-CM | POA: Diagnosis not present

## 2023-08-05 DIAGNOSIS — I872 Venous insufficiency (chronic) (peripheral): Secondary | ICD-10-CM | POA: Diagnosis not present

## 2023-08-05 DIAGNOSIS — I739 Peripheral vascular disease, unspecified: Secondary | ICD-10-CM | POA: Diagnosis not present

## 2023-08-05 DIAGNOSIS — I700 Atherosclerosis of aorta: Secondary | ICD-10-CM | POA: Diagnosis not present

## 2023-08-05 DIAGNOSIS — L97322 Non-pressure chronic ulcer of left ankle with fat layer exposed: Secondary | ICD-10-CM | POA: Diagnosis not present

## 2023-08-05 DIAGNOSIS — Z7982 Long term (current) use of aspirin: Secondary | ICD-10-CM | POA: Diagnosis not present

## 2023-08-05 DIAGNOSIS — I10 Essential (primary) hypertension: Secondary | ICD-10-CM | POA: Diagnosis not present

## 2023-08-05 DIAGNOSIS — M810 Age-related osteoporosis without current pathological fracture: Secondary | ICD-10-CM | POA: Diagnosis not present

## 2023-08-05 DIAGNOSIS — D500 Iron deficiency anemia secondary to blood loss (chronic): Secondary | ICD-10-CM | POA: Diagnosis not present

## 2023-08-09 DIAGNOSIS — Z7982 Long term (current) use of aspirin: Secondary | ICD-10-CM | POA: Diagnosis not present

## 2023-08-09 DIAGNOSIS — F419 Anxiety disorder, unspecified: Secondary | ICD-10-CM | POA: Diagnosis not present

## 2023-08-09 DIAGNOSIS — I739 Peripheral vascular disease, unspecified: Secondary | ICD-10-CM | POA: Diagnosis not present

## 2023-08-09 DIAGNOSIS — F321 Major depressive disorder, single episode, moderate: Secondary | ICD-10-CM | POA: Diagnosis not present

## 2023-08-09 DIAGNOSIS — D500 Iron deficiency anemia secondary to blood loss (chronic): Secondary | ICD-10-CM | POA: Diagnosis not present

## 2023-08-09 DIAGNOSIS — I10 Essential (primary) hypertension: Secondary | ICD-10-CM | POA: Diagnosis not present

## 2023-08-09 DIAGNOSIS — M810 Age-related osteoporosis without current pathological fracture: Secondary | ICD-10-CM | POA: Diagnosis not present

## 2023-08-09 DIAGNOSIS — I872 Venous insufficiency (chronic) (peripheral): Secondary | ICD-10-CM | POA: Diagnosis not present

## 2023-08-09 DIAGNOSIS — Z8616 Personal history of COVID-19: Secondary | ICD-10-CM | POA: Diagnosis not present

## 2023-08-09 DIAGNOSIS — I700 Atherosclerosis of aorta: Secondary | ICD-10-CM | POA: Diagnosis not present

## 2023-08-09 DIAGNOSIS — L97322 Non-pressure chronic ulcer of left ankle with fat layer exposed: Secondary | ICD-10-CM | POA: Diagnosis not present

## 2023-08-09 DIAGNOSIS — I890 Lymphedema, not elsewhere classified: Secondary | ICD-10-CM | POA: Diagnosis not present

## 2023-08-09 DIAGNOSIS — J449 Chronic obstructive pulmonary disease, unspecified: Secondary | ICD-10-CM | POA: Diagnosis not present

## 2023-08-12 DIAGNOSIS — K219 Gastro-esophageal reflux disease without esophagitis: Secondary | ICD-10-CM | POA: Diagnosis not present

## 2023-08-12 DIAGNOSIS — M810 Age-related osteoporosis without current pathological fracture: Secondary | ICD-10-CM | POA: Diagnosis not present

## 2023-08-12 DIAGNOSIS — S91101D Unspecified open wound of right great toe without damage to nail, subsequent encounter: Secondary | ICD-10-CM | POA: Diagnosis not present

## 2023-08-12 DIAGNOSIS — L97919 Non-pressure chronic ulcer of unspecified part of right lower leg with unspecified severity: Secondary | ICD-10-CM | POA: Diagnosis not present

## 2023-08-12 DIAGNOSIS — F32A Depression, unspecified: Secondary | ICD-10-CM | POA: Diagnosis not present

## 2023-08-12 DIAGNOSIS — X58XXXD Exposure to other specified factors, subsequent encounter: Secondary | ICD-10-CM | POA: Diagnosis not present

## 2023-08-12 DIAGNOSIS — I83019 Varicose veins of right lower extremity with ulcer of unspecified site: Secondary | ICD-10-CM | POA: Diagnosis not present

## 2023-08-12 DIAGNOSIS — M1990 Unspecified osteoarthritis, unspecified site: Secondary | ICD-10-CM | POA: Diagnosis not present

## 2023-08-12 DIAGNOSIS — Z79899 Other long term (current) drug therapy: Secondary | ICD-10-CM | POA: Diagnosis not present

## 2023-08-12 DIAGNOSIS — Z881 Allergy status to other antibiotic agents status: Secondary | ICD-10-CM | POA: Diagnosis not present

## 2023-08-12 DIAGNOSIS — Z8616 Personal history of COVID-19: Secondary | ICD-10-CM | POA: Diagnosis not present

## 2023-08-12 DIAGNOSIS — Z880 Allergy status to penicillin: Secondary | ICD-10-CM | POA: Diagnosis not present

## 2023-08-12 DIAGNOSIS — Z7982 Long term (current) use of aspirin: Secondary | ICD-10-CM | POA: Diagnosis not present

## 2023-08-12 DIAGNOSIS — I10 Essential (primary) hypertension: Secondary | ICD-10-CM | POA: Diagnosis not present

## 2023-08-12 DIAGNOSIS — Z87891 Personal history of nicotine dependence: Secondary | ICD-10-CM | POA: Diagnosis not present

## 2023-08-12 DIAGNOSIS — L03116 Cellulitis of left lower limb: Secondary | ICD-10-CM | POA: Diagnosis not present

## 2023-08-17 DIAGNOSIS — J449 Chronic obstructive pulmonary disease, unspecified: Secondary | ICD-10-CM | POA: Diagnosis not present

## 2023-08-17 DIAGNOSIS — Z8616 Personal history of COVID-19: Secondary | ICD-10-CM | POA: Diagnosis not present

## 2023-08-17 DIAGNOSIS — I872 Venous insufficiency (chronic) (peripheral): Secondary | ICD-10-CM | POA: Diagnosis not present

## 2023-08-17 DIAGNOSIS — L97322 Non-pressure chronic ulcer of left ankle with fat layer exposed: Secondary | ICD-10-CM | POA: Diagnosis not present

## 2023-08-17 DIAGNOSIS — I10 Essential (primary) hypertension: Secondary | ICD-10-CM | POA: Diagnosis not present

## 2023-08-17 DIAGNOSIS — I739 Peripheral vascular disease, unspecified: Secondary | ICD-10-CM | POA: Diagnosis not present

## 2023-08-17 DIAGNOSIS — F419 Anxiety disorder, unspecified: Secondary | ICD-10-CM | POA: Diagnosis not present

## 2023-08-17 DIAGNOSIS — M810 Age-related osteoporosis without current pathological fracture: Secondary | ICD-10-CM | POA: Diagnosis not present

## 2023-08-17 DIAGNOSIS — F321 Major depressive disorder, single episode, moderate: Secondary | ICD-10-CM | POA: Diagnosis not present

## 2023-08-17 DIAGNOSIS — I890 Lymphedema, not elsewhere classified: Secondary | ICD-10-CM | POA: Diagnosis not present

## 2023-08-17 DIAGNOSIS — D500 Iron deficiency anemia secondary to blood loss (chronic): Secondary | ICD-10-CM | POA: Diagnosis not present

## 2023-08-17 DIAGNOSIS — Z7982 Long term (current) use of aspirin: Secondary | ICD-10-CM | POA: Diagnosis not present

## 2023-08-17 DIAGNOSIS — I700 Atherosclerosis of aorta: Secondary | ICD-10-CM | POA: Diagnosis not present

## 2023-08-19 DIAGNOSIS — I700 Atherosclerosis of aorta: Secondary | ICD-10-CM | POA: Diagnosis not present

## 2023-08-19 DIAGNOSIS — J449 Chronic obstructive pulmonary disease, unspecified: Secondary | ICD-10-CM | POA: Diagnosis not present

## 2023-08-19 DIAGNOSIS — F321 Major depressive disorder, single episode, moderate: Secondary | ICD-10-CM | POA: Diagnosis not present

## 2023-08-19 DIAGNOSIS — F419 Anxiety disorder, unspecified: Secondary | ICD-10-CM | POA: Diagnosis not present

## 2023-08-19 DIAGNOSIS — Z8616 Personal history of COVID-19: Secondary | ICD-10-CM | POA: Diagnosis not present

## 2023-08-19 DIAGNOSIS — I890 Lymphedema, not elsewhere classified: Secondary | ICD-10-CM | POA: Diagnosis not present

## 2023-08-19 DIAGNOSIS — D500 Iron deficiency anemia secondary to blood loss (chronic): Secondary | ICD-10-CM | POA: Diagnosis not present

## 2023-08-19 DIAGNOSIS — I739 Peripheral vascular disease, unspecified: Secondary | ICD-10-CM | POA: Diagnosis not present

## 2023-08-19 DIAGNOSIS — M810 Age-related osteoporosis without current pathological fracture: Secondary | ICD-10-CM | POA: Diagnosis not present

## 2023-08-19 DIAGNOSIS — Z7982 Long term (current) use of aspirin: Secondary | ICD-10-CM | POA: Diagnosis not present

## 2023-08-19 DIAGNOSIS — I872 Venous insufficiency (chronic) (peripheral): Secondary | ICD-10-CM | POA: Diagnosis not present

## 2023-08-19 DIAGNOSIS — I10 Essential (primary) hypertension: Secondary | ICD-10-CM | POA: Diagnosis not present

## 2023-08-19 DIAGNOSIS — L97322 Non-pressure chronic ulcer of left ankle with fat layer exposed: Secondary | ICD-10-CM | POA: Diagnosis not present

## 2023-08-23 DIAGNOSIS — D500 Iron deficiency anemia secondary to blood loss (chronic): Secondary | ICD-10-CM | POA: Diagnosis not present

## 2023-08-23 DIAGNOSIS — M810 Age-related osteoporosis without current pathological fracture: Secondary | ICD-10-CM | POA: Diagnosis not present

## 2023-08-23 DIAGNOSIS — I739 Peripheral vascular disease, unspecified: Secondary | ICD-10-CM | POA: Diagnosis not present

## 2023-08-23 DIAGNOSIS — I700 Atherosclerosis of aorta: Secondary | ICD-10-CM | POA: Diagnosis not present

## 2023-08-23 DIAGNOSIS — F419 Anxiety disorder, unspecified: Secondary | ICD-10-CM | POA: Diagnosis not present

## 2023-08-23 DIAGNOSIS — I10 Essential (primary) hypertension: Secondary | ICD-10-CM | POA: Diagnosis not present

## 2023-08-23 DIAGNOSIS — I872 Venous insufficiency (chronic) (peripheral): Secondary | ICD-10-CM | POA: Diagnosis not present

## 2023-08-23 DIAGNOSIS — L97322 Non-pressure chronic ulcer of left ankle with fat layer exposed: Secondary | ICD-10-CM | POA: Diagnosis not present

## 2023-08-23 DIAGNOSIS — Z8616 Personal history of COVID-19: Secondary | ICD-10-CM | POA: Diagnosis not present

## 2023-08-23 DIAGNOSIS — I890 Lymphedema, not elsewhere classified: Secondary | ICD-10-CM | POA: Diagnosis not present

## 2023-08-23 DIAGNOSIS — J449 Chronic obstructive pulmonary disease, unspecified: Secondary | ICD-10-CM | POA: Diagnosis not present

## 2023-08-23 DIAGNOSIS — Z7982 Long term (current) use of aspirin: Secondary | ICD-10-CM | POA: Diagnosis not present

## 2023-08-23 DIAGNOSIS — F321 Major depressive disorder, single episode, moderate: Secondary | ICD-10-CM | POA: Diagnosis not present

## 2023-08-24 DIAGNOSIS — Z23 Encounter for immunization: Secondary | ICD-10-CM | POA: Diagnosis not present

## 2023-08-25 DIAGNOSIS — R600 Localized edema: Secondary | ICD-10-CM | POA: Diagnosis not present

## 2023-08-25 DIAGNOSIS — S91101D Unspecified open wound of right great toe without damage to nail, subsequent encounter: Secondary | ICD-10-CM | POA: Diagnosis not present

## 2023-08-25 DIAGNOSIS — I872 Venous insufficiency (chronic) (peripheral): Secondary | ICD-10-CM | POA: Diagnosis not present

## 2023-08-25 DIAGNOSIS — S91102A Unspecified open wound of left great toe without damage to nail, initial encounter: Secondary | ICD-10-CM | POA: Diagnosis not present

## 2023-08-31 DIAGNOSIS — I10 Essential (primary) hypertension: Secondary | ICD-10-CM | POA: Diagnosis not present

## 2023-08-31 DIAGNOSIS — D500 Iron deficiency anemia secondary to blood loss (chronic): Secondary | ICD-10-CM | POA: Diagnosis not present

## 2023-08-31 DIAGNOSIS — Z7982 Long term (current) use of aspirin: Secondary | ICD-10-CM | POA: Diagnosis not present

## 2023-08-31 DIAGNOSIS — I890 Lymphedema, not elsewhere classified: Secondary | ICD-10-CM | POA: Diagnosis not present

## 2023-08-31 DIAGNOSIS — L97322 Non-pressure chronic ulcer of left ankle with fat layer exposed: Secondary | ICD-10-CM | POA: Diagnosis not present

## 2023-08-31 DIAGNOSIS — M810 Age-related osteoporosis without current pathological fracture: Secondary | ICD-10-CM | POA: Diagnosis not present

## 2023-08-31 DIAGNOSIS — I872 Venous insufficiency (chronic) (peripheral): Secondary | ICD-10-CM | POA: Diagnosis not present

## 2023-08-31 DIAGNOSIS — I700 Atherosclerosis of aorta: Secondary | ICD-10-CM | POA: Diagnosis not present

## 2023-08-31 DIAGNOSIS — I739 Peripheral vascular disease, unspecified: Secondary | ICD-10-CM | POA: Diagnosis not present

## 2023-08-31 DIAGNOSIS — F321 Major depressive disorder, single episode, moderate: Secondary | ICD-10-CM | POA: Diagnosis not present

## 2023-08-31 DIAGNOSIS — J449 Chronic obstructive pulmonary disease, unspecified: Secondary | ICD-10-CM | POA: Diagnosis not present

## 2023-08-31 DIAGNOSIS — Z8616 Personal history of COVID-19: Secondary | ICD-10-CM | POA: Diagnosis not present

## 2023-08-31 DIAGNOSIS — F419 Anxiety disorder, unspecified: Secondary | ICD-10-CM | POA: Diagnosis not present

## 2023-09-07 DIAGNOSIS — J449 Chronic obstructive pulmonary disease, unspecified: Secondary | ICD-10-CM | POA: Diagnosis not present

## 2023-09-07 DIAGNOSIS — D500 Iron deficiency anemia secondary to blood loss (chronic): Secondary | ICD-10-CM | POA: Diagnosis not present

## 2023-09-07 DIAGNOSIS — L97322 Non-pressure chronic ulcer of left ankle with fat layer exposed: Secondary | ICD-10-CM | POA: Diagnosis not present

## 2023-09-07 DIAGNOSIS — I10 Essential (primary) hypertension: Secondary | ICD-10-CM | POA: Diagnosis not present

## 2023-09-07 DIAGNOSIS — I739 Peripheral vascular disease, unspecified: Secondary | ICD-10-CM | POA: Diagnosis not present

## 2023-09-07 DIAGNOSIS — M810 Age-related osteoporosis without current pathological fracture: Secondary | ICD-10-CM | POA: Diagnosis not present

## 2023-09-07 DIAGNOSIS — Z7982 Long term (current) use of aspirin: Secondary | ICD-10-CM | POA: Diagnosis not present

## 2023-09-07 DIAGNOSIS — I700 Atherosclerosis of aorta: Secondary | ICD-10-CM | POA: Diagnosis not present

## 2023-09-07 DIAGNOSIS — I872 Venous insufficiency (chronic) (peripheral): Secondary | ICD-10-CM | POA: Diagnosis not present

## 2023-09-07 DIAGNOSIS — F419 Anxiety disorder, unspecified: Secondary | ICD-10-CM | POA: Diagnosis not present

## 2023-09-07 DIAGNOSIS — F321 Major depressive disorder, single episode, moderate: Secondary | ICD-10-CM | POA: Diagnosis not present

## 2023-09-07 DIAGNOSIS — I890 Lymphedema, not elsewhere classified: Secondary | ICD-10-CM | POA: Diagnosis not present

## 2023-09-07 DIAGNOSIS — Z8616 Personal history of COVID-19: Secondary | ICD-10-CM | POA: Diagnosis not present

## 2023-09-14 DIAGNOSIS — I872 Venous insufficiency (chronic) (peripheral): Secondary | ICD-10-CM | POA: Diagnosis not present

## 2023-09-14 DIAGNOSIS — I890 Lymphedema, not elsewhere classified: Secondary | ICD-10-CM | POA: Diagnosis not present

## 2023-09-14 DIAGNOSIS — J449 Chronic obstructive pulmonary disease, unspecified: Secondary | ICD-10-CM | POA: Diagnosis not present

## 2023-09-14 DIAGNOSIS — D500 Iron deficiency anemia secondary to blood loss (chronic): Secondary | ICD-10-CM | POA: Diagnosis not present

## 2023-09-14 DIAGNOSIS — I700 Atherosclerosis of aorta: Secondary | ICD-10-CM | POA: Diagnosis not present

## 2023-09-14 DIAGNOSIS — F321 Major depressive disorder, single episode, moderate: Secondary | ICD-10-CM | POA: Diagnosis not present

## 2023-09-14 DIAGNOSIS — Z8616 Personal history of COVID-19: Secondary | ICD-10-CM | POA: Diagnosis not present

## 2023-09-14 DIAGNOSIS — L97322 Non-pressure chronic ulcer of left ankle with fat layer exposed: Secondary | ICD-10-CM | POA: Diagnosis not present

## 2023-09-14 DIAGNOSIS — M810 Age-related osteoporosis without current pathological fracture: Secondary | ICD-10-CM | POA: Diagnosis not present

## 2023-09-14 DIAGNOSIS — F419 Anxiety disorder, unspecified: Secondary | ICD-10-CM | POA: Diagnosis not present

## 2023-09-14 DIAGNOSIS — I10 Essential (primary) hypertension: Secondary | ICD-10-CM | POA: Diagnosis not present

## 2023-09-14 DIAGNOSIS — I739 Peripheral vascular disease, unspecified: Secondary | ICD-10-CM | POA: Diagnosis not present

## 2023-09-14 DIAGNOSIS — Z7982 Long term (current) use of aspirin: Secondary | ICD-10-CM | POA: Diagnosis not present

## 2023-09-15 DIAGNOSIS — L97321 Non-pressure chronic ulcer of left ankle limited to breakdown of skin: Secondary | ICD-10-CM | POA: Diagnosis not present

## 2023-09-15 DIAGNOSIS — I872 Venous insufficiency (chronic) (peripheral): Secondary | ICD-10-CM | POA: Diagnosis not present

## 2023-09-21 DIAGNOSIS — F419 Anxiety disorder, unspecified: Secondary | ICD-10-CM | POA: Diagnosis not present

## 2023-09-21 DIAGNOSIS — M810 Age-related osteoporosis without current pathological fracture: Secondary | ICD-10-CM | POA: Diagnosis not present

## 2023-09-21 DIAGNOSIS — L97322 Non-pressure chronic ulcer of left ankle with fat layer exposed: Secondary | ICD-10-CM | POA: Diagnosis not present

## 2023-09-21 DIAGNOSIS — I700 Atherosclerosis of aorta: Secondary | ICD-10-CM | POA: Diagnosis not present

## 2023-09-21 DIAGNOSIS — I739 Peripheral vascular disease, unspecified: Secondary | ICD-10-CM | POA: Diagnosis not present

## 2023-09-21 DIAGNOSIS — J449 Chronic obstructive pulmonary disease, unspecified: Secondary | ICD-10-CM | POA: Diagnosis not present

## 2023-09-21 DIAGNOSIS — Z7982 Long term (current) use of aspirin: Secondary | ICD-10-CM | POA: Diagnosis not present

## 2023-09-21 DIAGNOSIS — Z8616 Personal history of COVID-19: Secondary | ICD-10-CM | POA: Diagnosis not present

## 2023-09-21 DIAGNOSIS — I10 Essential (primary) hypertension: Secondary | ICD-10-CM | POA: Diagnosis not present

## 2023-09-21 DIAGNOSIS — I872 Venous insufficiency (chronic) (peripheral): Secondary | ICD-10-CM | POA: Diagnosis not present

## 2023-09-21 DIAGNOSIS — D500 Iron deficiency anemia secondary to blood loss (chronic): Secondary | ICD-10-CM | POA: Diagnosis not present

## 2023-09-21 DIAGNOSIS — I890 Lymphedema, not elsewhere classified: Secondary | ICD-10-CM | POA: Diagnosis not present

## 2023-09-21 DIAGNOSIS — F321 Major depressive disorder, single episode, moderate: Secondary | ICD-10-CM | POA: Diagnosis not present

## 2023-09-28 DIAGNOSIS — M810 Age-related osteoporosis without current pathological fracture: Secondary | ICD-10-CM | POA: Diagnosis not present

## 2023-09-28 DIAGNOSIS — I700 Atherosclerosis of aorta: Secondary | ICD-10-CM | POA: Diagnosis not present

## 2023-09-28 DIAGNOSIS — Z8616 Personal history of COVID-19: Secondary | ICD-10-CM | POA: Diagnosis not present

## 2023-09-28 DIAGNOSIS — J449 Chronic obstructive pulmonary disease, unspecified: Secondary | ICD-10-CM | POA: Diagnosis not present

## 2023-09-28 DIAGNOSIS — I890 Lymphedema, not elsewhere classified: Secondary | ICD-10-CM | POA: Diagnosis not present

## 2023-09-28 DIAGNOSIS — I10 Essential (primary) hypertension: Secondary | ICD-10-CM | POA: Diagnosis not present

## 2023-09-28 DIAGNOSIS — I739 Peripheral vascular disease, unspecified: Secondary | ICD-10-CM | POA: Diagnosis not present

## 2023-09-28 DIAGNOSIS — F419 Anxiety disorder, unspecified: Secondary | ICD-10-CM | POA: Diagnosis not present

## 2023-09-28 DIAGNOSIS — D500 Iron deficiency anemia secondary to blood loss (chronic): Secondary | ICD-10-CM | POA: Diagnosis not present

## 2023-09-28 DIAGNOSIS — Z7982 Long term (current) use of aspirin: Secondary | ICD-10-CM | POA: Diagnosis not present

## 2023-09-28 DIAGNOSIS — F321 Major depressive disorder, single episode, moderate: Secondary | ICD-10-CM | POA: Diagnosis not present

## 2023-09-28 DIAGNOSIS — L97322 Non-pressure chronic ulcer of left ankle with fat layer exposed: Secondary | ICD-10-CM | POA: Diagnosis not present

## 2023-09-28 DIAGNOSIS — I872 Venous insufficiency (chronic) (peripheral): Secondary | ICD-10-CM | POA: Diagnosis not present

## 2023-10-04 DIAGNOSIS — F321 Major depressive disorder, single episode, moderate: Secondary | ICD-10-CM | POA: Diagnosis not present

## 2023-10-04 DIAGNOSIS — I700 Atherosclerosis of aorta: Secondary | ICD-10-CM | POA: Diagnosis not present

## 2023-10-04 DIAGNOSIS — I10 Essential (primary) hypertension: Secondary | ICD-10-CM | POA: Diagnosis not present

## 2023-10-04 DIAGNOSIS — Z8616 Personal history of COVID-19: Secondary | ICD-10-CM | POA: Diagnosis not present

## 2023-10-04 DIAGNOSIS — L97322 Non-pressure chronic ulcer of left ankle with fat layer exposed: Secondary | ICD-10-CM | POA: Diagnosis not present

## 2023-10-04 DIAGNOSIS — I890 Lymphedema, not elsewhere classified: Secondary | ICD-10-CM | POA: Diagnosis not present

## 2023-10-04 DIAGNOSIS — I739 Peripheral vascular disease, unspecified: Secondary | ICD-10-CM | POA: Diagnosis not present

## 2023-10-04 DIAGNOSIS — I872 Venous insufficiency (chronic) (peripheral): Secondary | ICD-10-CM | POA: Diagnosis not present

## 2023-10-04 DIAGNOSIS — J449 Chronic obstructive pulmonary disease, unspecified: Secondary | ICD-10-CM | POA: Diagnosis not present

## 2023-10-04 DIAGNOSIS — F419 Anxiety disorder, unspecified: Secondary | ICD-10-CM | POA: Diagnosis not present

## 2023-10-04 DIAGNOSIS — D500 Iron deficiency anemia secondary to blood loss (chronic): Secondary | ICD-10-CM | POA: Diagnosis not present

## 2023-10-04 DIAGNOSIS — Z7982 Long term (current) use of aspirin: Secondary | ICD-10-CM | POA: Diagnosis not present

## 2023-10-04 DIAGNOSIS — M810 Age-related osteoporosis without current pathological fracture: Secondary | ICD-10-CM | POA: Diagnosis not present

## 2023-10-12 DIAGNOSIS — I10 Essential (primary) hypertension: Secondary | ICD-10-CM | POA: Diagnosis not present

## 2023-10-12 DIAGNOSIS — Z7982 Long term (current) use of aspirin: Secondary | ICD-10-CM | POA: Diagnosis not present

## 2023-10-12 DIAGNOSIS — F419 Anxiety disorder, unspecified: Secondary | ICD-10-CM | POA: Diagnosis not present

## 2023-10-12 DIAGNOSIS — J449 Chronic obstructive pulmonary disease, unspecified: Secondary | ICD-10-CM | POA: Diagnosis not present

## 2023-10-12 DIAGNOSIS — I872 Venous insufficiency (chronic) (peripheral): Secondary | ICD-10-CM | POA: Diagnosis not present

## 2023-10-12 DIAGNOSIS — D500 Iron deficiency anemia secondary to blood loss (chronic): Secondary | ICD-10-CM | POA: Diagnosis not present

## 2023-10-12 DIAGNOSIS — I890 Lymphedema, not elsewhere classified: Secondary | ICD-10-CM | POA: Diagnosis not present

## 2023-10-12 DIAGNOSIS — L97322 Non-pressure chronic ulcer of left ankle with fat layer exposed: Secondary | ICD-10-CM | POA: Diagnosis not present

## 2023-10-12 DIAGNOSIS — Z8616 Personal history of COVID-19: Secondary | ICD-10-CM | POA: Diagnosis not present

## 2023-10-12 DIAGNOSIS — I700 Atherosclerosis of aorta: Secondary | ICD-10-CM | POA: Diagnosis not present

## 2023-10-12 DIAGNOSIS — F321 Major depressive disorder, single episode, moderate: Secondary | ICD-10-CM | POA: Diagnosis not present

## 2023-10-12 DIAGNOSIS — M810 Age-related osteoporosis without current pathological fracture: Secondary | ICD-10-CM | POA: Diagnosis not present

## 2023-10-12 DIAGNOSIS — I739 Peripheral vascular disease, unspecified: Secondary | ICD-10-CM | POA: Diagnosis not present

## 2023-10-18 DIAGNOSIS — I739 Peripheral vascular disease, unspecified: Secondary | ICD-10-CM | POA: Diagnosis not present

## 2023-10-18 DIAGNOSIS — Z8616 Personal history of COVID-19: Secondary | ICD-10-CM | POA: Diagnosis not present

## 2023-10-18 DIAGNOSIS — J449 Chronic obstructive pulmonary disease, unspecified: Secondary | ICD-10-CM | POA: Diagnosis not present

## 2023-10-18 DIAGNOSIS — M810 Age-related osteoporosis without current pathological fracture: Secondary | ICD-10-CM | POA: Diagnosis not present

## 2023-10-18 DIAGNOSIS — F321 Major depressive disorder, single episode, moderate: Secondary | ICD-10-CM | POA: Diagnosis not present

## 2023-10-18 DIAGNOSIS — I872 Venous insufficiency (chronic) (peripheral): Secondary | ICD-10-CM | POA: Diagnosis not present

## 2023-10-18 DIAGNOSIS — Z7982 Long term (current) use of aspirin: Secondary | ICD-10-CM | POA: Diagnosis not present

## 2023-10-18 DIAGNOSIS — I700 Atherosclerosis of aorta: Secondary | ICD-10-CM | POA: Diagnosis not present

## 2023-10-18 DIAGNOSIS — L97322 Non-pressure chronic ulcer of left ankle with fat layer exposed: Secondary | ICD-10-CM | POA: Diagnosis not present

## 2023-10-18 DIAGNOSIS — I890 Lymphedema, not elsewhere classified: Secondary | ICD-10-CM | POA: Diagnosis not present

## 2023-10-18 DIAGNOSIS — D500 Iron deficiency anemia secondary to blood loss (chronic): Secondary | ICD-10-CM | POA: Diagnosis not present

## 2023-10-18 DIAGNOSIS — F419 Anxiety disorder, unspecified: Secondary | ICD-10-CM | POA: Diagnosis not present

## 2023-10-18 DIAGNOSIS — I10 Essential (primary) hypertension: Secondary | ICD-10-CM | POA: Diagnosis not present

## 2023-10-27 DIAGNOSIS — Z7983 Long term (current) use of bisphosphonates: Secondary | ICD-10-CM | POA: Diagnosis not present

## 2023-10-27 DIAGNOSIS — I872 Venous insufficiency (chronic) (peripheral): Secondary | ICD-10-CM | POA: Diagnosis not present

## 2023-10-27 DIAGNOSIS — I890 Lymphedema, not elsewhere classified: Secondary | ICD-10-CM | POA: Diagnosis not present

## 2023-10-27 DIAGNOSIS — I10 Essential (primary) hypertension: Secondary | ICD-10-CM | POA: Diagnosis not present

## 2023-10-27 DIAGNOSIS — Z96641 Presence of right artificial hip joint: Secondary | ICD-10-CM | POA: Diagnosis not present

## 2023-10-27 DIAGNOSIS — Z7982 Long term (current) use of aspirin: Secondary | ICD-10-CM | POA: Diagnosis not present

## 2023-10-27 DIAGNOSIS — Z87891 Personal history of nicotine dependence: Secondary | ICD-10-CM | POA: Diagnosis not present

## 2023-10-27 DIAGNOSIS — L97321 Non-pressure chronic ulcer of left ankle limited to breakdown of skin: Secondary | ICD-10-CM | POA: Diagnosis not present

## 2023-10-27 DIAGNOSIS — Z90710 Acquired absence of both cervix and uterus: Secondary | ICD-10-CM | POA: Diagnosis not present

## 2023-10-27 DIAGNOSIS — J449 Chronic obstructive pulmonary disease, unspecified: Secondary | ICD-10-CM | POA: Diagnosis not present

## 2023-10-27 DIAGNOSIS — Z8616 Personal history of COVID-19: Secondary | ICD-10-CM | POA: Diagnosis not present

## 2023-10-28 DIAGNOSIS — F321 Major depressive disorder, single episode, moderate: Secondary | ICD-10-CM | POA: Diagnosis not present

## 2023-10-28 DIAGNOSIS — Z7982 Long term (current) use of aspirin: Secondary | ICD-10-CM | POA: Diagnosis not present

## 2023-10-28 DIAGNOSIS — Z8616 Personal history of COVID-19: Secondary | ICD-10-CM | POA: Diagnosis not present

## 2023-10-28 DIAGNOSIS — I700 Atherosclerosis of aorta: Secondary | ICD-10-CM | POA: Diagnosis not present

## 2023-10-28 DIAGNOSIS — I739 Peripheral vascular disease, unspecified: Secondary | ICD-10-CM | POA: Diagnosis not present

## 2023-10-28 DIAGNOSIS — D500 Iron deficiency anemia secondary to blood loss (chronic): Secondary | ICD-10-CM | POA: Diagnosis not present

## 2023-10-28 DIAGNOSIS — I890 Lymphedema, not elsewhere classified: Secondary | ICD-10-CM | POA: Diagnosis not present

## 2023-10-28 DIAGNOSIS — I10 Essential (primary) hypertension: Secondary | ICD-10-CM | POA: Diagnosis not present

## 2023-10-28 DIAGNOSIS — F419 Anxiety disorder, unspecified: Secondary | ICD-10-CM | POA: Diagnosis not present

## 2023-10-28 DIAGNOSIS — I872 Venous insufficiency (chronic) (peripheral): Secondary | ICD-10-CM | POA: Diagnosis not present

## 2023-10-28 DIAGNOSIS — M810 Age-related osteoporosis without current pathological fracture: Secondary | ICD-10-CM | POA: Diagnosis not present

## 2023-10-28 DIAGNOSIS — J449 Chronic obstructive pulmonary disease, unspecified: Secondary | ICD-10-CM | POA: Diagnosis not present

## 2023-10-28 DIAGNOSIS — L97322 Non-pressure chronic ulcer of left ankle with fat layer exposed: Secondary | ICD-10-CM | POA: Diagnosis not present

## 2023-11-01 DIAGNOSIS — M79674 Pain in right toe(s): Secondary | ICD-10-CM | POA: Diagnosis not present

## 2023-11-01 DIAGNOSIS — I739 Peripheral vascular disease, unspecified: Secondary | ICD-10-CM | POA: Diagnosis not present

## 2023-11-01 DIAGNOSIS — L851 Acquired keratosis [keratoderma] palmaris et plantaris: Secondary | ICD-10-CM | POA: Diagnosis not present

## 2023-11-01 DIAGNOSIS — B351 Tinea unguium: Secondary | ICD-10-CM | POA: Diagnosis not present

## 2023-11-02 DIAGNOSIS — I890 Lymphedema, not elsewhere classified: Secondary | ICD-10-CM | POA: Diagnosis not present

## 2023-11-02 DIAGNOSIS — Z7982 Long term (current) use of aspirin: Secondary | ICD-10-CM | POA: Diagnosis not present

## 2023-11-02 DIAGNOSIS — Z8616 Personal history of COVID-19: Secondary | ICD-10-CM | POA: Diagnosis not present

## 2023-11-02 DIAGNOSIS — I872 Venous insufficiency (chronic) (peripheral): Secondary | ICD-10-CM | POA: Diagnosis not present

## 2023-11-02 DIAGNOSIS — F321 Major depressive disorder, single episode, moderate: Secondary | ICD-10-CM | POA: Diagnosis not present

## 2023-11-02 DIAGNOSIS — L97322 Non-pressure chronic ulcer of left ankle with fat layer exposed: Secondary | ICD-10-CM | POA: Diagnosis not present

## 2023-11-02 DIAGNOSIS — I739 Peripheral vascular disease, unspecified: Secondary | ICD-10-CM | POA: Diagnosis not present

## 2023-11-02 DIAGNOSIS — D500 Iron deficiency anemia secondary to blood loss (chronic): Secondary | ICD-10-CM | POA: Diagnosis not present

## 2023-11-02 DIAGNOSIS — I700 Atherosclerosis of aorta: Secondary | ICD-10-CM | POA: Diagnosis not present

## 2023-11-02 DIAGNOSIS — I10 Essential (primary) hypertension: Secondary | ICD-10-CM | POA: Diagnosis not present

## 2023-11-02 DIAGNOSIS — J449 Chronic obstructive pulmonary disease, unspecified: Secondary | ICD-10-CM | POA: Diagnosis not present

## 2023-11-02 DIAGNOSIS — M810 Age-related osteoporosis without current pathological fracture: Secondary | ICD-10-CM | POA: Diagnosis not present

## 2023-11-02 DIAGNOSIS — F419 Anxiety disorder, unspecified: Secondary | ICD-10-CM | POA: Diagnosis not present

## 2023-11-03 DIAGNOSIS — L309 Dermatitis, unspecified: Secondary | ICD-10-CM | POA: Diagnosis not present

## 2023-11-11 DIAGNOSIS — F321 Major depressive disorder, single episode, moderate: Secondary | ICD-10-CM | POA: Diagnosis not present

## 2023-11-11 DIAGNOSIS — I700 Atherosclerosis of aorta: Secondary | ICD-10-CM | POA: Diagnosis not present

## 2023-11-11 DIAGNOSIS — D500 Iron deficiency anemia secondary to blood loss (chronic): Secondary | ICD-10-CM | POA: Diagnosis not present

## 2023-11-11 DIAGNOSIS — J449 Chronic obstructive pulmonary disease, unspecified: Secondary | ICD-10-CM | POA: Diagnosis not present

## 2023-11-11 DIAGNOSIS — Z8616 Personal history of COVID-19: Secondary | ICD-10-CM | POA: Diagnosis not present

## 2023-11-11 DIAGNOSIS — F419 Anxiety disorder, unspecified: Secondary | ICD-10-CM | POA: Diagnosis not present

## 2023-11-11 DIAGNOSIS — I10 Essential (primary) hypertension: Secondary | ICD-10-CM | POA: Diagnosis not present

## 2023-11-11 DIAGNOSIS — M810 Age-related osteoporosis without current pathological fracture: Secondary | ICD-10-CM | POA: Diagnosis not present

## 2023-11-11 DIAGNOSIS — L97322 Non-pressure chronic ulcer of left ankle with fat layer exposed: Secondary | ICD-10-CM | POA: Diagnosis not present

## 2023-11-11 DIAGNOSIS — I872 Venous insufficiency (chronic) (peripheral): Secondary | ICD-10-CM | POA: Diagnosis not present

## 2023-11-11 DIAGNOSIS — Z7982 Long term (current) use of aspirin: Secondary | ICD-10-CM | POA: Diagnosis not present

## 2023-11-11 DIAGNOSIS — I739 Peripheral vascular disease, unspecified: Secondary | ICD-10-CM | POA: Diagnosis not present

## 2023-11-11 DIAGNOSIS — I890 Lymphedema, not elsewhere classified: Secondary | ICD-10-CM | POA: Diagnosis not present

## 2023-11-15 DIAGNOSIS — I739 Peripheral vascular disease, unspecified: Secondary | ICD-10-CM | POA: Diagnosis not present

## 2023-11-15 DIAGNOSIS — I872 Venous insufficiency (chronic) (peripheral): Secondary | ICD-10-CM | POA: Diagnosis not present

## 2023-11-15 DIAGNOSIS — L97322 Non-pressure chronic ulcer of left ankle with fat layer exposed: Secondary | ICD-10-CM | POA: Diagnosis not present

## 2023-11-15 DIAGNOSIS — I890 Lymphedema, not elsewhere classified: Secondary | ICD-10-CM | POA: Diagnosis not present

## 2023-11-15 DIAGNOSIS — F321 Major depressive disorder, single episode, moderate: Secondary | ICD-10-CM | POA: Diagnosis not present

## 2023-11-15 DIAGNOSIS — I10 Essential (primary) hypertension: Secondary | ICD-10-CM | POA: Diagnosis not present

## 2023-11-15 DIAGNOSIS — F419 Anxiety disorder, unspecified: Secondary | ICD-10-CM | POA: Diagnosis not present

## 2023-11-15 DIAGNOSIS — Z7982 Long term (current) use of aspirin: Secondary | ICD-10-CM | POA: Diagnosis not present

## 2023-11-15 DIAGNOSIS — J449 Chronic obstructive pulmonary disease, unspecified: Secondary | ICD-10-CM | POA: Diagnosis not present

## 2023-11-15 DIAGNOSIS — Z8616 Personal history of COVID-19: Secondary | ICD-10-CM | POA: Diagnosis not present

## 2023-11-15 DIAGNOSIS — D500 Iron deficiency anemia secondary to blood loss (chronic): Secondary | ICD-10-CM | POA: Diagnosis not present

## 2023-11-15 DIAGNOSIS — M810 Age-related osteoporosis without current pathological fracture: Secondary | ICD-10-CM | POA: Diagnosis not present

## 2023-11-15 DIAGNOSIS — I700 Atherosclerosis of aorta: Secondary | ICD-10-CM | POA: Diagnosis not present

## 2023-11-18 DIAGNOSIS — I872 Venous insufficiency (chronic) (peripheral): Secondary | ICD-10-CM | POA: Diagnosis not present

## 2023-11-18 DIAGNOSIS — I10 Essential (primary) hypertension: Secondary | ICD-10-CM | POA: Diagnosis not present

## 2023-11-18 DIAGNOSIS — I739 Peripheral vascular disease, unspecified: Secondary | ICD-10-CM | POA: Diagnosis not present

## 2023-11-18 DIAGNOSIS — D500 Iron deficiency anemia secondary to blood loss (chronic): Secondary | ICD-10-CM | POA: Diagnosis not present

## 2023-11-18 DIAGNOSIS — I700 Atherosclerosis of aorta: Secondary | ICD-10-CM | POA: Diagnosis not present

## 2023-11-18 DIAGNOSIS — J449 Chronic obstructive pulmonary disease, unspecified: Secondary | ICD-10-CM | POA: Diagnosis not present

## 2023-11-18 DIAGNOSIS — M810 Age-related osteoporosis without current pathological fracture: Secondary | ICD-10-CM | POA: Diagnosis not present

## 2023-11-18 DIAGNOSIS — F419 Anxiety disorder, unspecified: Secondary | ICD-10-CM | POA: Diagnosis not present

## 2023-11-18 DIAGNOSIS — Z8616 Personal history of COVID-19: Secondary | ICD-10-CM | POA: Diagnosis not present

## 2023-11-18 DIAGNOSIS — Z7982 Long term (current) use of aspirin: Secondary | ICD-10-CM | POA: Diagnosis not present

## 2023-11-18 DIAGNOSIS — F321 Major depressive disorder, single episode, moderate: Secondary | ICD-10-CM | POA: Diagnosis not present

## 2023-11-18 DIAGNOSIS — I890 Lymphedema, not elsewhere classified: Secondary | ICD-10-CM | POA: Diagnosis not present

## 2023-11-18 DIAGNOSIS — L97322 Non-pressure chronic ulcer of left ankle with fat layer exposed: Secondary | ICD-10-CM | POA: Diagnosis not present

## 2023-11-23 DIAGNOSIS — I872 Venous insufficiency (chronic) (peripheral): Secondary | ICD-10-CM | POA: Diagnosis not present

## 2023-11-23 DIAGNOSIS — D500 Iron deficiency anemia secondary to blood loss (chronic): Secondary | ICD-10-CM | POA: Diagnosis not present

## 2023-11-23 DIAGNOSIS — I890 Lymphedema, not elsewhere classified: Secondary | ICD-10-CM | POA: Diagnosis not present

## 2023-11-23 DIAGNOSIS — I10 Essential (primary) hypertension: Secondary | ICD-10-CM | POA: Diagnosis not present

## 2023-11-23 DIAGNOSIS — F321 Major depressive disorder, single episode, moderate: Secondary | ICD-10-CM | POA: Diagnosis not present

## 2023-11-23 DIAGNOSIS — Z7982 Long term (current) use of aspirin: Secondary | ICD-10-CM | POA: Diagnosis not present

## 2023-11-23 DIAGNOSIS — J449 Chronic obstructive pulmonary disease, unspecified: Secondary | ICD-10-CM | POA: Diagnosis not present

## 2023-11-23 DIAGNOSIS — I700 Atherosclerosis of aorta: Secondary | ICD-10-CM | POA: Diagnosis not present

## 2023-11-23 DIAGNOSIS — Z8616 Personal history of COVID-19: Secondary | ICD-10-CM | POA: Diagnosis not present

## 2023-11-23 DIAGNOSIS — M810 Age-related osteoporosis without current pathological fracture: Secondary | ICD-10-CM | POA: Diagnosis not present

## 2023-11-23 DIAGNOSIS — F419 Anxiety disorder, unspecified: Secondary | ICD-10-CM | POA: Diagnosis not present

## 2023-11-23 DIAGNOSIS — L97322 Non-pressure chronic ulcer of left ankle with fat layer exposed: Secondary | ICD-10-CM | POA: Diagnosis not present

## 2023-11-23 DIAGNOSIS — I739 Peripheral vascular disease, unspecified: Secondary | ICD-10-CM | POA: Diagnosis not present

## 2023-11-26 DIAGNOSIS — I890 Lymphedema, not elsewhere classified: Secondary | ICD-10-CM | POA: Diagnosis not present

## 2023-11-26 DIAGNOSIS — F321 Major depressive disorder, single episode, moderate: Secondary | ICD-10-CM | POA: Diagnosis not present

## 2023-11-26 DIAGNOSIS — D500 Iron deficiency anemia secondary to blood loss (chronic): Secondary | ICD-10-CM | POA: Diagnosis not present

## 2023-11-26 DIAGNOSIS — J449 Chronic obstructive pulmonary disease, unspecified: Secondary | ICD-10-CM | POA: Diagnosis not present

## 2023-11-26 DIAGNOSIS — L97322 Non-pressure chronic ulcer of left ankle with fat layer exposed: Secondary | ICD-10-CM | POA: Diagnosis not present

## 2023-11-26 DIAGNOSIS — F419 Anxiety disorder, unspecified: Secondary | ICD-10-CM | POA: Diagnosis not present

## 2023-11-26 DIAGNOSIS — I700 Atherosclerosis of aorta: Secondary | ICD-10-CM | POA: Diagnosis not present

## 2023-11-26 DIAGNOSIS — I739 Peripheral vascular disease, unspecified: Secondary | ICD-10-CM | POA: Diagnosis not present

## 2023-11-26 DIAGNOSIS — M810 Age-related osteoporosis without current pathological fracture: Secondary | ICD-10-CM | POA: Diagnosis not present

## 2023-11-26 DIAGNOSIS — Z7982 Long term (current) use of aspirin: Secondary | ICD-10-CM | POA: Diagnosis not present

## 2023-11-26 DIAGNOSIS — I10 Essential (primary) hypertension: Secondary | ICD-10-CM | POA: Diagnosis not present

## 2023-11-26 DIAGNOSIS — I872 Venous insufficiency (chronic) (peripheral): Secondary | ICD-10-CM | POA: Diagnosis not present

## 2023-11-26 DIAGNOSIS — Z8616 Personal history of COVID-19: Secondary | ICD-10-CM | POA: Diagnosis not present

## 2023-11-29 DIAGNOSIS — Z8616 Personal history of COVID-19: Secondary | ICD-10-CM | POA: Diagnosis not present

## 2023-11-29 DIAGNOSIS — Z7982 Long term (current) use of aspirin: Secondary | ICD-10-CM | POA: Diagnosis not present

## 2023-11-29 DIAGNOSIS — D500 Iron deficiency anemia secondary to blood loss (chronic): Secondary | ICD-10-CM | POA: Diagnosis not present

## 2023-11-29 DIAGNOSIS — J449 Chronic obstructive pulmonary disease, unspecified: Secondary | ICD-10-CM | POA: Diagnosis not present

## 2023-11-29 DIAGNOSIS — M810 Age-related osteoporosis without current pathological fracture: Secondary | ICD-10-CM | POA: Diagnosis not present

## 2023-11-29 DIAGNOSIS — L97322 Non-pressure chronic ulcer of left ankle with fat layer exposed: Secondary | ICD-10-CM | POA: Diagnosis not present

## 2023-11-29 DIAGNOSIS — F419 Anxiety disorder, unspecified: Secondary | ICD-10-CM | POA: Diagnosis not present

## 2023-11-29 DIAGNOSIS — I10 Essential (primary) hypertension: Secondary | ICD-10-CM | POA: Diagnosis not present

## 2023-11-29 DIAGNOSIS — I739 Peripheral vascular disease, unspecified: Secondary | ICD-10-CM | POA: Diagnosis not present

## 2023-11-29 DIAGNOSIS — I700 Atherosclerosis of aorta: Secondary | ICD-10-CM | POA: Diagnosis not present

## 2023-11-29 DIAGNOSIS — I890 Lymphedema, not elsewhere classified: Secondary | ICD-10-CM | POA: Diagnosis not present

## 2023-11-29 DIAGNOSIS — I872 Venous insufficiency (chronic) (peripheral): Secondary | ICD-10-CM | POA: Diagnosis not present

## 2023-11-29 DIAGNOSIS — F321 Major depressive disorder, single episode, moderate: Secondary | ICD-10-CM | POA: Diagnosis not present

## 2023-12-02 DIAGNOSIS — I890 Lymphedema, not elsewhere classified: Secondary | ICD-10-CM | POA: Diagnosis not present

## 2023-12-02 DIAGNOSIS — I872 Venous insufficiency (chronic) (peripheral): Secondary | ICD-10-CM | POA: Diagnosis not present

## 2023-12-02 DIAGNOSIS — Z8616 Personal history of COVID-19: Secondary | ICD-10-CM | POA: Diagnosis not present

## 2023-12-02 DIAGNOSIS — F321 Major depressive disorder, single episode, moderate: Secondary | ICD-10-CM | POA: Diagnosis not present

## 2023-12-02 DIAGNOSIS — I10 Essential (primary) hypertension: Secondary | ICD-10-CM | POA: Diagnosis not present

## 2023-12-02 DIAGNOSIS — Z7982 Long term (current) use of aspirin: Secondary | ICD-10-CM | POA: Diagnosis not present

## 2023-12-02 DIAGNOSIS — I700 Atherosclerosis of aorta: Secondary | ICD-10-CM | POA: Diagnosis not present

## 2023-12-02 DIAGNOSIS — D500 Iron deficiency anemia secondary to blood loss (chronic): Secondary | ICD-10-CM | POA: Diagnosis not present

## 2023-12-02 DIAGNOSIS — M810 Age-related osteoporosis without current pathological fracture: Secondary | ICD-10-CM | POA: Diagnosis not present

## 2023-12-02 DIAGNOSIS — J449 Chronic obstructive pulmonary disease, unspecified: Secondary | ICD-10-CM | POA: Diagnosis not present

## 2023-12-02 DIAGNOSIS — L97322 Non-pressure chronic ulcer of left ankle with fat layer exposed: Secondary | ICD-10-CM | POA: Diagnosis not present

## 2023-12-02 DIAGNOSIS — I739 Peripheral vascular disease, unspecified: Secondary | ICD-10-CM | POA: Diagnosis not present

## 2023-12-02 DIAGNOSIS — F419 Anxiety disorder, unspecified: Secondary | ICD-10-CM | POA: Diagnosis not present

## 2023-12-07 DIAGNOSIS — F321 Major depressive disorder, single episode, moderate: Secondary | ICD-10-CM | POA: Diagnosis not present

## 2023-12-07 DIAGNOSIS — M810 Age-related osteoporosis without current pathological fracture: Secondary | ICD-10-CM | POA: Diagnosis not present

## 2023-12-07 DIAGNOSIS — L97322 Non-pressure chronic ulcer of left ankle with fat layer exposed: Secondary | ICD-10-CM | POA: Diagnosis not present

## 2023-12-07 DIAGNOSIS — I10 Essential (primary) hypertension: Secondary | ICD-10-CM | POA: Diagnosis not present

## 2023-12-07 DIAGNOSIS — Z7982 Long term (current) use of aspirin: Secondary | ICD-10-CM | POA: Diagnosis not present

## 2023-12-07 DIAGNOSIS — D500 Iron deficiency anemia secondary to blood loss (chronic): Secondary | ICD-10-CM | POA: Diagnosis not present

## 2023-12-07 DIAGNOSIS — F419 Anxiety disorder, unspecified: Secondary | ICD-10-CM | POA: Diagnosis not present

## 2023-12-07 DIAGNOSIS — I700 Atherosclerosis of aorta: Secondary | ICD-10-CM | POA: Diagnosis not present

## 2023-12-07 DIAGNOSIS — I872 Venous insufficiency (chronic) (peripheral): Secondary | ICD-10-CM | POA: Diagnosis not present

## 2023-12-07 DIAGNOSIS — Z8616 Personal history of COVID-19: Secondary | ICD-10-CM | POA: Diagnosis not present

## 2023-12-07 DIAGNOSIS — J449 Chronic obstructive pulmonary disease, unspecified: Secondary | ICD-10-CM | POA: Diagnosis not present

## 2023-12-07 DIAGNOSIS — I890 Lymphedema, not elsewhere classified: Secondary | ICD-10-CM | POA: Diagnosis not present

## 2023-12-07 DIAGNOSIS — I739 Peripheral vascular disease, unspecified: Secondary | ICD-10-CM | POA: Diagnosis not present

## 2023-12-13 DIAGNOSIS — I10 Essential (primary) hypertension: Secondary | ICD-10-CM | POA: Diagnosis not present

## 2023-12-13 DIAGNOSIS — F321 Major depressive disorder, single episode, moderate: Secondary | ICD-10-CM | POA: Diagnosis not present

## 2023-12-13 DIAGNOSIS — J449 Chronic obstructive pulmonary disease, unspecified: Secondary | ICD-10-CM | POA: Diagnosis not present

## 2023-12-13 DIAGNOSIS — D500 Iron deficiency anemia secondary to blood loss (chronic): Secondary | ICD-10-CM | POA: Diagnosis not present

## 2023-12-13 DIAGNOSIS — I739 Peripheral vascular disease, unspecified: Secondary | ICD-10-CM | POA: Diagnosis not present

## 2023-12-13 DIAGNOSIS — I700 Atherosclerosis of aorta: Secondary | ICD-10-CM | POA: Diagnosis not present

## 2023-12-13 DIAGNOSIS — M810 Age-related osteoporosis without current pathological fracture: Secondary | ICD-10-CM | POA: Diagnosis not present

## 2023-12-13 DIAGNOSIS — I872 Venous insufficiency (chronic) (peripheral): Secondary | ICD-10-CM | POA: Diagnosis not present

## 2023-12-13 DIAGNOSIS — L97322 Non-pressure chronic ulcer of left ankle with fat layer exposed: Secondary | ICD-10-CM | POA: Diagnosis not present

## 2023-12-13 DIAGNOSIS — F419 Anxiety disorder, unspecified: Secondary | ICD-10-CM | POA: Diagnosis not present

## 2023-12-13 DIAGNOSIS — I890 Lymphedema, not elsewhere classified: Secondary | ICD-10-CM | POA: Diagnosis not present

## 2023-12-13 DIAGNOSIS — Z8616 Personal history of COVID-19: Secondary | ICD-10-CM | POA: Diagnosis not present

## 2023-12-13 DIAGNOSIS — Z7982 Long term (current) use of aspirin: Secondary | ICD-10-CM | POA: Diagnosis not present

## 2023-12-23 DIAGNOSIS — I872 Venous insufficiency (chronic) (peripheral): Secondary | ICD-10-CM | POA: Diagnosis not present

## 2023-12-23 DIAGNOSIS — I10 Essential (primary) hypertension: Secondary | ICD-10-CM | POA: Diagnosis not present

## 2023-12-23 DIAGNOSIS — F419 Anxiety disorder, unspecified: Secondary | ICD-10-CM | POA: Diagnosis not present

## 2023-12-23 DIAGNOSIS — L97322 Non-pressure chronic ulcer of left ankle with fat layer exposed: Secondary | ICD-10-CM | POA: Diagnosis not present

## 2023-12-23 DIAGNOSIS — D500 Iron deficiency anemia secondary to blood loss (chronic): Secondary | ICD-10-CM | POA: Diagnosis not present

## 2023-12-23 DIAGNOSIS — I700 Atherosclerosis of aorta: Secondary | ICD-10-CM | POA: Diagnosis not present

## 2023-12-23 DIAGNOSIS — Z7982 Long term (current) use of aspirin: Secondary | ICD-10-CM | POA: Diagnosis not present

## 2023-12-23 DIAGNOSIS — I739 Peripheral vascular disease, unspecified: Secondary | ICD-10-CM | POA: Diagnosis not present

## 2023-12-23 DIAGNOSIS — I890 Lymphedema, not elsewhere classified: Secondary | ICD-10-CM | POA: Diagnosis not present

## 2023-12-23 DIAGNOSIS — Z8616 Personal history of COVID-19: Secondary | ICD-10-CM | POA: Diagnosis not present

## 2023-12-23 DIAGNOSIS — F321 Major depressive disorder, single episode, moderate: Secondary | ICD-10-CM | POA: Diagnosis not present

## 2023-12-23 DIAGNOSIS — J449 Chronic obstructive pulmonary disease, unspecified: Secondary | ICD-10-CM | POA: Diagnosis not present

## 2023-12-23 DIAGNOSIS — M810 Age-related osteoporosis without current pathological fracture: Secondary | ICD-10-CM | POA: Diagnosis not present

## 2023-12-27 DIAGNOSIS — F321 Major depressive disorder, single episode, moderate: Secondary | ICD-10-CM | POA: Diagnosis not present

## 2023-12-27 DIAGNOSIS — D500 Iron deficiency anemia secondary to blood loss (chronic): Secondary | ICD-10-CM | POA: Diagnosis not present

## 2023-12-27 DIAGNOSIS — Z8616 Personal history of COVID-19: Secondary | ICD-10-CM | POA: Diagnosis not present

## 2023-12-27 DIAGNOSIS — Z7982 Long term (current) use of aspirin: Secondary | ICD-10-CM | POA: Diagnosis not present

## 2023-12-27 DIAGNOSIS — I872 Venous insufficiency (chronic) (peripheral): Secondary | ICD-10-CM | POA: Diagnosis not present

## 2023-12-27 DIAGNOSIS — F419 Anxiety disorder, unspecified: Secondary | ICD-10-CM | POA: Diagnosis not present

## 2023-12-27 DIAGNOSIS — I10 Essential (primary) hypertension: Secondary | ICD-10-CM | POA: Diagnosis not present

## 2023-12-27 DIAGNOSIS — I890 Lymphedema, not elsewhere classified: Secondary | ICD-10-CM | POA: Diagnosis not present

## 2023-12-27 DIAGNOSIS — L97322 Non-pressure chronic ulcer of left ankle with fat layer exposed: Secondary | ICD-10-CM | POA: Diagnosis not present

## 2023-12-27 DIAGNOSIS — I700 Atherosclerosis of aorta: Secondary | ICD-10-CM | POA: Diagnosis not present

## 2023-12-27 DIAGNOSIS — J449 Chronic obstructive pulmonary disease, unspecified: Secondary | ICD-10-CM | POA: Diagnosis not present

## 2023-12-27 DIAGNOSIS — M810 Age-related osteoporosis without current pathological fracture: Secondary | ICD-10-CM | POA: Diagnosis not present

## 2023-12-27 DIAGNOSIS — I739 Peripheral vascular disease, unspecified: Secondary | ICD-10-CM | POA: Diagnosis not present

## 2023-12-30 DIAGNOSIS — J449 Chronic obstructive pulmonary disease, unspecified: Secondary | ICD-10-CM | POA: Diagnosis not present

## 2023-12-30 DIAGNOSIS — I10 Essential (primary) hypertension: Secondary | ICD-10-CM | POA: Diagnosis not present

## 2023-12-30 DIAGNOSIS — M810 Age-related osteoporosis without current pathological fracture: Secondary | ICD-10-CM | POA: Diagnosis not present

## 2023-12-30 DIAGNOSIS — I872 Venous insufficiency (chronic) (peripheral): Secondary | ICD-10-CM | POA: Diagnosis not present

## 2023-12-30 DIAGNOSIS — F419 Anxiety disorder, unspecified: Secondary | ICD-10-CM | POA: Diagnosis not present

## 2023-12-30 DIAGNOSIS — L97322 Non-pressure chronic ulcer of left ankle with fat layer exposed: Secondary | ICD-10-CM | POA: Diagnosis not present

## 2023-12-30 DIAGNOSIS — I739 Peripheral vascular disease, unspecified: Secondary | ICD-10-CM | POA: Diagnosis not present

## 2023-12-30 DIAGNOSIS — Z8616 Personal history of COVID-19: Secondary | ICD-10-CM | POA: Diagnosis not present

## 2023-12-30 DIAGNOSIS — I700 Atherosclerosis of aorta: Secondary | ICD-10-CM | POA: Diagnosis not present

## 2023-12-30 DIAGNOSIS — Z7982 Long term (current) use of aspirin: Secondary | ICD-10-CM | POA: Diagnosis not present

## 2023-12-30 DIAGNOSIS — F321 Major depressive disorder, single episode, moderate: Secondary | ICD-10-CM | POA: Diagnosis not present

## 2023-12-30 DIAGNOSIS — D500 Iron deficiency anemia secondary to blood loss (chronic): Secondary | ICD-10-CM | POA: Diagnosis not present

## 2023-12-30 DIAGNOSIS — I890 Lymphedema, not elsewhere classified: Secondary | ICD-10-CM | POA: Diagnosis not present

## 2024-01-04 DIAGNOSIS — D500 Iron deficiency anemia secondary to blood loss (chronic): Secondary | ICD-10-CM | POA: Diagnosis not present

## 2024-01-04 DIAGNOSIS — F321 Major depressive disorder, single episode, moderate: Secondary | ICD-10-CM | POA: Diagnosis not present

## 2024-01-04 DIAGNOSIS — I739 Peripheral vascular disease, unspecified: Secondary | ICD-10-CM | POA: Diagnosis not present

## 2024-01-04 DIAGNOSIS — Z8616 Personal history of COVID-19: Secondary | ICD-10-CM | POA: Diagnosis not present

## 2024-01-04 DIAGNOSIS — Z7982 Long term (current) use of aspirin: Secondary | ICD-10-CM | POA: Diagnosis not present

## 2024-01-04 DIAGNOSIS — L97322 Non-pressure chronic ulcer of left ankle with fat layer exposed: Secondary | ICD-10-CM | POA: Diagnosis not present

## 2024-01-04 DIAGNOSIS — M810 Age-related osteoporosis without current pathological fracture: Secondary | ICD-10-CM | POA: Diagnosis not present

## 2024-01-04 DIAGNOSIS — J449 Chronic obstructive pulmonary disease, unspecified: Secondary | ICD-10-CM | POA: Diagnosis not present

## 2024-01-04 DIAGNOSIS — I872 Venous insufficiency (chronic) (peripheral): Secondary | ICD-10-CM | POA: Diagnosis not present

## 2024-01-04 DIAGNOSIS — I700 Atherosclerosis of aorta: Secondary | ICD-10-CM | POA: Diagnosis not present

## 2024-01-04 DIAGNOSIS — F419 Anxiety disorder, unspecified: Secondary | ICD-10-CM | POA: Diagnosis not present

## 2024-01-04 DIAGNOSIS — I10 Essential (primary) hypertension: Secondary | ICD-10-CM | POA: Diagnosis not present

## 2024-01-04 DIAGNOSIS — I890 Lymphedema, not elsewhere classified: Secondary | ICD-10-CM | POA: Diagnosis not present

## 2024-01-07 DIAGNOSIS — M810 Age-related osteoporosis without current pathological fracture: Secondary | ICD-10-CM | POA: Diagnosis not present

## 2024-01-07 DIAGNOSIS — F419 Anxiety disorder, unspecified: Secondary | ICD-10-CM | POA: Diagnosis not present

## 2024-01-07 DIAGNOSIS — Z8616 Personal history of COVID-19: Secondary | ICD-10-CM | POA: Diagnosis not present

## 2024-01-07 DIAGNOSIS — I739 Peripheral vascular disease, unspecified: Secondary | ICD-10-CM | POA: Diagnosis not present

## 2024-01-07 DIAGNOSIS — I700 Atherosclerosis of aorta: Secondary | ICD-10-CM | POA: Diagnosis not present

## 2024-01-07 DIAGNOSIS — I10 Essential (primary) hypertension: Secondary | ICD-10-CM | POA: Diagnosis not present

## 2024-01-07 DIAGNOSIS — D500 Iron deficiency anemia secondary to blood loss (chronic): Secondary | ICD-10-CM | POA: Diagnosis not present

## 2024-01-07 DIAGNOSIS — Z7982 Long term (current) use of aspirin: Secondary | ICD-10-CM | POA: Diagnosis not present

## 2024-01-07 DIAGNOSIS — I872 Venous insufficiency (chronic) (peripheral): Secondary | ICD-10-CM | POA: Diagnosis not present

## 2024-01-07 DIAGNOSIS — I890 Lymphedema, not elsewhere classified: Secondary | ICD-10-CM | POA: Diagnosis not present

## 2024-01-07 DIAGNOSIS — F321 Major depressive disorder, single episode, moderate: Secondary | ICD-10-CM | POA: Diagnosis not present

## 2024-01-07 DIAGNOSIS — J449 Chronic obstructive pulmonary disease, unspecified: Secondary | ICD-10-CM | POA: Diagnosis not present

## 2024-01-07 DIAGNOSIS — L97322 Non-pressure chronic ulcer of left ankle with fat layer exposed: Secondary | ICD-10-CM | POA: Diagnosis not present

## 2024-01-10 DIAGNOSIS — F321 Major depressive disorder, single episode, moderate: Secondary | ICD-10-CM | POA: Diagnosis not present

## 2024-01-10 DIAGNOSIS — Z8616 Personal history of COVID-19: Secondary | ICD-10-CM | POA: Diagnosis not present

## 2024-01-10 DIAGNOSIS — I872 Venous insufficiency (chronic) (peripheral): Secondary | ICD-10-CM | POA: Diagnosis not present

## 2024-01-10 DIAGNOSIS — Z7982 Long term (current) use of aspirin: Secondary | ICD-10-CM | POA: Diagnosis not present

## 2024-01-10 DIAGNOSIS — J449 Chronic obstructive pulmonary disease, unspecified: Secondary | ICD-10-CM | POA: Diagnosis not present

## 2024-01-10 DIAGNOSIS — I700 Atherosclerosis of aorta: Secondary | ICD-10-CM | POA: Diagnosis not present

## 2024-01-10 DIAGNOSIS — F419 Anxiety disorder, unspecified: Secondary | ICD-10-CM | POA: Diagnosis not present

## 2024-01-10 DIAGNOSIS — I739 Peripheral vascular disease, unspecified: Secondary | ICD-10-CM | POA: Diagnosis not present

## 2024-01-10 DIAGNOSIS — D500 Iron deficiency anemia secondary to blood loss (chronic): Secondary | ICD-10-CM | POA: Diagnosis not present

## 2024-01-10 DIAGNOSIS — M810 Age-related osteoporosis without current pathological fracture: Secondary | ICD-10-CM | POA: Diagnosis not present

## 2024-01-10 DIAGNOSIS — I890 Lymphedema, not elsewhere classified: Secondary | ICD-10-CM | POA: Diagnosis not present

## 2024-01-10 DIAGNOSIS — I10 Essential (primary) hypertension: Secondary | ICD-10-CM | POA: Diagnosis not present

## 2024-01-10 DIAGNOSIS — L97322 Non-pressure chronic ulcer of left ankle with fat layer exposed: Secondary | ICD-10-CM | POA: Diagnosis not present

## 2024-01-13 DIAGNOSIS — Z8616 Personal history of COVID-19: Secondary | ICD-10-CM | POA: Diagnosis not present

## 2024-01-13 DIAGNOSIS — L97322 Non-pressure chronic ulcer of left ankle with fat layer exposed: Secondary | ICD-10-CM | POA: Diagnosis not present

## 2024-01-13 DIAGNOSIS — D500 Iron deficiency anemia secondary to blood loss (chronic): Secondary | ICD-10-CM | POA: Diagnosis not present

## 2024-01-13 DIAGNOSIS — Z7982 Long term (current) use of aspirin: Secondary | ICD-10-CM | POA: Diagnosis not present

## 2024-01-13 DIAGNOSIS — J449 Chronic obstructive pulmonary disease, unspecified: Secondary | ICD-10-CM | POA: Diagnosis not present

## 2024-01-13 DIAGNOSIS — F419 Anxiety disorder, unspecified: Secondary | ICD-10-CM | POA: Diagnosis not present

## 2024-01-13 DIAGNOSIS — I890 Lymphedema, not elsewhere classified: Secondary | ICD-10-CM | POA: Diagnosis not present

## 2024-01-13 DIAGNOSIS — M810 Age-related osteoporosis without current pathological fracture: Secondary | ICD-10-CM | POA: Diagnosis not present

## 2024-01-13 DIAGNOSIS — I872 Venous insufficiency (chronic) (peripheral): Secondary | ICD-10-CM | POA: Diagnosis not present

## 2024-01-13 DIAGNOSIS — F321 Major depressive disorder, single episode, moderate: Secondary | ICD-10-CM | POA: Diagnosis not present

## 2024-01-13 DIAGNOSIS — I10 Essential (primary) hypertension: Secondary | ICD-10-CM | POA: Diagnosis not present

## 2024-01-13 DIAGNOSIS — I739 Peripheral vascular disease, unspecified: Secondary | ICD-10-CM | POA: Diagnosis not present

## 2024-01-13 DIAGNOSIS — I700 Atherosclerosis of aorta: Secondary | ICD-10-CM | POA: Diagnosis not present

## 2024-01-17 DIAGNOSIS — M79674 Pain in right toe(s): Secondary | ICD-10-CM | POA: Diagnosis not present

## 2024-01-17 DIAGNOSIS — B351 Tinea unguium: Secondary | ICD-10-CM | POA: Diagnosis not present

## 2024-01-17 DIAGNOSIS — L851 Acquired keratosis [keratoderma] palmaris et plantaris: Secondary | ICD-10-CM | POA: Diagnosis not present

## 2024-01-17 DIAGNOSIS — I739 Peripheral vascular disease, unspecified: Secondary | ICD-10-CM | POA: Diagnosis not present

## 2024-01-17 DIAGNOSIS — M79675 Pain in left toe(s): Secondary | ICD-10-CM | POA: Diagnosis not present

## 2024-01-18 DIAGNOSIS — I872 Venous insufficiency (chronic) (peripheral): Secondary | ICD-10-CM | POA: Diagnosis not present

## 2024-01-18 DIAGNOSIS — F419 Anxiety disorder, unspecified: Secondary | ICD-10-CM | POA: Diagnosis not present

## 2024-01-18 DIAGNOSIS — I700 Atherosclerosis of aorta: Secondary | ICD-10-CM | POA: Diagnosis not present

## 2024-01-18 DIAGNOSIS — Z7982 Long term (current) use of aspirin: Secondary | ICD-10-CM | POA: Diagnosis not present

## 2024-01-18 DIAGNOSIS — L97322 Non-pressure chronic ulcer of left ankle with fat layer exposed: Secondary | ICD-10-CM | POA: Diagnosis not present

## 2024-01-18 DIAGNOSIS — F321 Major depressive disorder, single episode, moderate: Secondary | ICD-10-CM | POA: Diagnosis not present

## 2024-01-18 DIAGNOSIS — I890 Lymphedema, not elsewhere classified: Secondary | ICD-10-CM | POA: Diagnosis not present

## 2024-01-18 DIAGNOSIS — M810 Age-related osteoporosis without current pathological fracture: Secondary | ICD-10-CM | POA: Diagnosis not present

## 2024-01-18 DIAGNOSIS — I10 Essential (primary) hypertension: Secondary | ICD-10-CM | POA: Diagnosis not present

## 2024-01-18 DIAGNOSIS — Z8616 Personal history of COVID-19: Secondary | ICD-10-CM | POA: Diagnosis not present

## 2024-01-18 DIAGNOSIS — D500 Iron deficiency anemia secondary to blood loss (chronic): Secondary | ICD-10-CM | POA: Diagnosis not present

## 2024-01-18 DIAGNOSIS — J449 Chronic obstructive pulmonary disease, unspecified: Secondary | ICD-10-CM | POA: Diagnosis not present

## 2024-01-18 DIAGNOSIS — I739 Peripheral vascular disease, unspecified: Secondary | ICD-10-CM | POA: Diagnosis not present

## 2024-01-21 DIAGNOSIS — I890 Lymphedema, not elsewhere classified: Secondary | ICD-10-CM | POA: Diagnosis not present

## 2024-01-21 DIAGNOSIS — I872 Venous insufficiency (chronic) (peripheral): Secondary | ICD-10-CM | POA: Diagnosis not present

## 2024-01-21 DIAGNOSIS — Z7982 Long term (current) use of aspirin: Secondary | ICD-10-CM | POA: Diagnosis not present

## 2024-01-21 DIAGNOSIS — J449 Chronic obstructive pulmonary disease, unspecified: Secondary | ICD-10-CM | POA: Diagnosis not present

## 2024-01-21 DIAGNOSIS — I739 Peripheral vascular disease, unspecified: Secondary | ICD-10-CM | POA: Diagnosis not present

## 2024-01-21 DIAGNOSIS — M810 Age-related osteoporosis without current pathological fracture: Secondary | ICD-10-CM | POA: Diagnosis not present

## 2024-01-21 DIAGNOSIS — F321 Major depressive disorder, single episode, moderate: Secondary | ICD-10-CM | POA: Diagnosis not present

## 2024-01-21 DIAGNOSIS — F419 Anxiety disorder, unspecified: Secondary | ICD-10-CM | POA: Diagnosis not present

## 2024-01-21 DIAGNOSIS — L97322 Non-pressure chronic ulcer of left ankle with fat layer exposed: Secondary | ICD-10-CM | POA: Diagnosis not present

## 2024-01-21 DIAGNOSIS — I10 Essential (primary) hypertension: Secondary | ICD-10-CM | POA: Diagnosis not present

## 2024-01-21 DIAGNOSIS — D500 Iron deficiency anemia secondary to blood loss (chronic): Secondary | ICD-10-CM | POA: Diagnosis not present

## 2024-01-21 DIAGNOSIS — Z8616 Personal history of COVID-19: Secondary | ICD-10-CM | POA: Diagnosis not present

## 2024-01-21 DIAGNOSIS — I700 Atherosclerosis of aorta: Secondary | ICD-10-CM | POA: Diagnosis not present

## 2024-01-24 DIAGNOSIS — D500 Iron deficiency anemia secondary to blood loss (chronic): Secondary | ICD-10-CM | POA: Diagnosis not present

## 2024-01-24 DIAGNOSIS — F321 Major depressive disorder, single episode, moderate: Secondary | ICD-10-CM | POA: Diagnosis not present

## 2024-01-24 DIAGNOSIS — Z7982 Long term (current) use of aspirin: Secondary | ICD-10-CM | POA: Diagnosis not present

## 2024-01-24 DIAGNOSIS — I739 Peripheral vascular disease, unspecified: Secondary | ICD-10-CM | POA: Diagnosis not present

## 2024-01-24 DIAGNOSIS — Z8616 Personal history of COVID-19: Secondary | ICD-10-CM | POA: Diagnosis not present

## 2024-01-24 DIAGNOSIS — I872 Venous insufficiency (chronic) (peripheral): Secondary | ICD-10-CM | POA: Diagnosis not present

## 2024-01-24 DIAGNOSIS — I10 Essential (primary) hypertension: Secondary | ICD-10-CM | POA: Diagnosis not present

## 2024-01-24 DIAGNOSIS — L97322 Non-pressure chronic ulcer of left ankle with fat layer exposed: Secondary | ICD-10-CM | POA: Diagnosis not present

## 2024-01-24 DIAGNOSIS — I890 Lymphedema, not elsewhere classified: Secondary | ICD-10-CM | POA: Diagnosis not present

## 2024-01-24 DIAGNOSIS — F419 Anxiety disorder, unspecified: Secondary | ICD-10-CM | POA: Diagnosis not present

## 2024-01-24 DIAGNOSIS — J449 Chronic obstructive pulmonary disease, unspecified: Secondary | ICD-10-CM | POA: Diagnosis not present

## 2024-01-24 DIAGNOSIS — I700 Atherosclerosis of aorta: Secondary | ICD-10-CM | POA: Diagnosis not present

## 2024-01-24 DIAGNOSIS — M810 Age-related osteoporosis without current pathological fracture: Secondary | ICD-10-CM | POA: Diagnosis not present

## 2024-01-27 DIAGNOSIS — L97322 Non-pressure chronic ulcer of left ankle with fat layer exposed: Secondary | ICD-10-CM | POA: Diagnosis not present

## 2024-01-27 DIAGNOSIS — Z8616 Personal history of COVID-19: Secondary | ICD-10-CM | POA: Diagnosis not present

## 2024-01-27 DIAGNOSIS — I890 Lymphedema, not elsewhere classified: Secondary | ICD-10-CM | POA: Diagnosis not present

## 2024-01-27 DIAGNOSIS — M810 Age-related osteoporosis without current pathological fracture: Secondary | ICD-10-CM | POA: Diagnosis not present

## 2024-01-27 DIAGNOSIS — I700 Atherosclerosis of aorta: Secondary | ICD-10-CM | POA: Diagnosis not present

## 2024-01-27 DIAGNOSIS — D500 Iron deficiency anemia secondary to blood loss (chronic): Secondary | ICD-10-CM | POA: Diagnosis not present

## 2024-01-27 DIAGNOSIS — F321 Major depressive disorder, single episode, moderate: Secondary | ICD-10-CM | POA: Diagnosis not present

## 2024-01-27 DIAGNOSIS — I10 Essential (primary) hypertension: Secondary | ICD-10-CM | POA: Diagnosis not present

## 2024-01-27 DIAGNOSIS — I739 Peripheral vascular disease, unspecified: Secondary | ICD-10-CM | POA: Diagnosis not present

## 2024-01-27 DIAGNOSIS — J449 Chronic obstructive pulmonary disease, unspecified: Secondary | ICD-10-CM | POA: Diagnosis not present

## 2024-01-27 DIAGNOSIS — F419 Anxiety disorder, unspecified: Secondary | ICD-10-CM | POA: Diagnosis not present

## 2024-01-27 DIAGNOSIS — Z7982 Long term (current) use of aspirin: Secondary | ICD-10-CM | POA: Diagnosis not present

## 2024-01-27 DIAGNOSIS — I872 Venous insufficiency (chronic) (peripheral): Secondary | ICD-10-CM | POA: Diagnosis not present

## 2024-02-01 DIAGNOSIS — I890 Lymphedema, not elsewhere classified: Secondary | ICD-10-CM | POA: Diagnosis not present

## 2024-02-01 DIAGNOSIS — F321 Major depressive disorder, single episode, moderate: Secondary | ICD-10-CM | POA: Diagnosis not present

## 2024-02-01 DIAGNOSIS — I739 Peripheral vascular disease, unspecified: Secondary | ICD-10-CM | POA: Diagnosis not present

## 2024-02-01 DIAGNOSIS — I700 Atherosclerosis of aorta: Secondary | ICD-10-CM | POA: Diagnosis not present

## 2024-02-01 DIAGNOSIS — Z8616 Personal history of COVID-19: Secondary | ICD-10-CM | POA: Diagnosis not present

## 2024-02-01 DIAGNOSIS — D500 Iron deficiency anemia secondary to blood loss (chronic): Secondary | ICD-10-CM | POA: Diagnosis not present

## 2024-02-01 DIAGNOSIS — M810 Age-related osteoporosis without current pathological fracture: Secondary | ICD-10-CM | POA: Diagnosis not present

## 2024-02-01 DIAGNOSIS — F419 Anxiety disorder, unspecified: Secondary | ICD-10-CM | POA: Diagnosis not present

## 2024-02-01 DIAGNOSIS — J449 Chronic obstructive pulmonary disease, unspecified: Secondary | ICD-10-CM | POA: Diagnosis not present

## 2024-02-01 DIAGNOSIS — I10 Essential (primary) hypertension: Secondary | ICD-10-CM | POA: Diagnosis not present

## 2024-02-01 DIAGNOSIS — I872 Venous insufficiency (chronic) (peripheral): Secondary | ICD-10-CM | POA: Diagnosis not present

## 2024-02-01 DIAGNOSIS — Z7982 Long term (current) use of aspirin: Secondary | ICD-10-CM | POA: Diagnosis not present

## 2024-02-01 DIAGNOSIS — L97322 Non-pressure chronic ulcer of left ankle with fat layer exposed: Secondary | ICD-10-CM | POA: Diagnosis not present

## 2024-02-04 DIAGNOSIS — L97322 Non-pressure chronic ulcer of left ankle with fat layer exposed: Secondary | ICD-10-CM | POA: Diagnosis not present

## 2024-02-04 DIAGNOSIS — I700 Atherosclerosis of aorta: Secondary | ICD-10-CM | POA: Diagnosis not present

## 2024-02-04 DIAGNOSIS — I10 Essential (primary) hypertension: Secondary | ICD-10-CM | POA: Diagnosis not present

## 2024-02-04 DIAGNOSIS — F419 Anxiety disorder, unspecified: Secondary | ICD-10-CM | POA: Diagnosis not present

## 2024-02-04 DIAGNOSIS — J449 Chronic obstructive pulmonary disease, unspecified: Secondary | ICD-10-CM | POA: Diagnosis not present

## 2024-02-04 DIAGNOSIS — Z7982 Long term (current) use of aspirin: Secondary | ICD-10-CM | POA: Diagnosis not present

## 2024-02-04 DIAGNOSIS — I890 Lymphedema, not elsewhere classified: Secondary | ICD-10-CM | POA: Diagnosis not present

## 2024-02-04 DIAGNOSIS — Z8616 Personal history of COVID-19: Secondary | ICD-10-CM | POA: Diagnosis not present

## 2024-02-04 DIAGNOSIS — I872 Venous insufficiency (chronic) (peripheral): Secondary | ICD-10-CM | POA: Diagnosis not present

## 2024-02-04 DIAGNOSIS — I739 Peripheral vascular disease, unspecified: Secondary | ICD-10-CM | POA: Diagnosis not present

## 2024-02-04 DIAGNOSIS — D500 Iron deficiency anemia secondary to blood loss (chronic): Secondary | ICD-10-CM | POA: Diagnosis not present

## 2024-02-04 DIAGNOSIS — F321 Major depressive disorder, single episode, moderate: Secondary | ICD-10-CM | POA: Diagnosis not present

## 2024-02-04 DIAGNOSIS — M810 Age-related osteoporosis without current pathological fracture: Secondary | ICD-10-CM | POA: Diagnosis not present

## 2024-02-07 DIAGNOSIS — Z8616 Personal history of COVID-19: Secondary | ICD-10-CM | POA: Diagnosis not present

## 2024-02-07 DIAGNOSIS — I10 Essential (primary) hypertension: Secondary | ICD-10-CM | POA: Diagnosis not present

## 2024-02-07 DIAGNOSIS — I872 Venous insufficiency (chronic) (peripheral): Secondary | ICD-10-CM | POA: Diagnosis not present

## 2024-02-07 DIAGNOSIS — M810 Age-related osteoporosis without current pathological fracture: Secondary | ICD-10-CM | POA: Diagnosis not present

## 2024-02-07 DIAGNOSIS — Z7982 Long term (current) use of aspirin: Secondary | ICD-10-CM | POA: Diagnosis not present

## 2024-02-07 DIAGNOSIS — I739 Peripheral vascular disease, unspecified: Secondary | ICD-10-CM | POA: Diagnosis not present

## 2024-02-07 DIAGNOSIS — I700 Atherosclerosis of aorta: Secondary | ICD-10-CM | POA: Diagnosis not present

## 2024-02-07 DIAGNOSIS — J449 Chronic obstructive pulmonary disease, unspecified: Secondary | ICD-10-CM | POA: Diagnosis not present

## 2024-02-07 DIAGNOSIS — I890 Lymphedema, not elsewhere classified: Secondary | ICD-10-CM | POA: Diagnosis not present

## 2024-02-07 DIAGNOSIS — D500 Iron deficiency anemia secondary to blood loss (chronic): Secondary | ICD-10-CM | POA: Diagnosis not present

## 2024-02-07 DIAGNOSIS — F419 Anxiety disorder, unspecified: Secondary | ICD-10-CM | POA: Diagnosis not present

## 2024-02-07 DIAGNOSIS — F321 Major depressive disorder, single episode, moderate: Secondary | ICD-10-CM | POA: Diagnosis not present

## 2024-02-07 DIAGNOSIS — L97322 Non-pressure chronic ulcer of left ankle with fat layer exposed: Secondary | ICD-10-CM | POA: Diagnosis not present

## 2024-02-09 DIAGNOSIS — I872 Venous insufficiency (chronic) (peripheral): Secondary | ICD-10-CM | POA: Diagnosis not present

## 2024-02-09 DIAGNOSIS — Z8719 Personal history of other diseases of the digestive system: Secondary | ICD-10-CM | POA: Diagnosis not present

## 2024-02-09 DIAGNOSIS — L84 Corns and callosities: Secondary | ICD-10-CM | POA: Diagnosis not present

## 2024-02-09 DIAGNOSIS — I890 Lymphedema, not elsewhere classified: Secondary | ICD-10-CM | POA: Diagnosis not present

## 2024-02-09 DIAGNOSIS — L97222 Non-pressure chronic ulcer of left calf with fat layer exposed: Secondary | ICD-10-CM | POA: Diagnosis not present

## 2024-02-09 DIAGNOSIS — S91101S Unspecified open wound of right great toe without damage to nail, sequela: Secondary | ICD-10-CM | POA: Diagnosis not present

## 2024-02-09 DIAGNOSIS — S91102D Unspecified open wound of left great toe without damage to nail, subsequent encounter: Secondary | ICD-10-CM | POA: Diagnosis not present

## 2024-02-15 DIAGNOSIS — F321 Major depressive disorder, single episode, moderate: Secondary | ICD-10-CM | POA: Diagnosis not present

## 2024-02-15 DIAGNOSIS — J449 Chronic obstructive pulmonary disease, unspecified: Secondary | ICD-10-CM | POA: Diagnosis not present

## 2024-02-15 DIAGNOSIS — I10 Essential (primary) hypertension: Secondary | ICD-10-CM | POA: Diagnosis not present

## 2024-02-15 DIAGNOSIS — F419 Anxiety disorder, unspecified: Secondary | ICD-10-CM | POA: Diagnosis not present

## 2024-02-15 DIAGNOSIS — M810 Age-related osteoporosis without current pathological fracture: Secondary | ICD-10-CM | POA: Diagnosis not present

## 2024-02-15 DIAGNOSIS — I700 Atherosclerosis of aorta: Secondary | ICD-10-CM | POA: Diagnosis not present

## 2024-02-15 DIAGNOSIS — I872 Venous insufficiency (chronic) (peripheral): Secondary | ICD-10-CM | POA: Diagnosis not present

## 2024-02-15 DIAGNOSIS — D500 Iron deficiency anemia secondary to blood loss (chronic): Secondary | ICD-10-CM | POA: Diagnosis not present

## 2024-02-15 DIAGNOSIS — Z7982 Long term (current) use of aspirin: Secondary | ICD-10-CM | POA: Diagnosis not present

## 2024-02-15 DIAGNOSIS — Z8616 Personal history of COVID-19: Secondary | ICD-10-CM | POA: Diagnosis not present

## 2024-02-15 DIAGNOSIS — I739 Peripheral vascular disease, unspecified: Secondary | ICD-10-CM | POA: Diagnosis not present

## 2024-02-15 DIAGNOSIS — L97322 Non-pressure chronic ulcer of left ankle with fat layer exposed: Secondary | ICD-10-CM | POA: Diagnosis not present

## 2024-02-15 DIAGNOSIS — I890 Lymphedema, not elsewhere classified: Secondary | ICD-10-CM | POA: Diagnosis not present

## 2024-02-18 DIAGNOSIS — Z8616 Personal history of COVID-19: Secondary | ICD-10-CM | POA: Diagnosis not present

## 2024-02-18 DIAGNOSIS — I872 Venous insufficiency (chronic) (peripheral): Secondary | ICD-10-CM | POA: Diagnosis not present

## 2024-02-18 DIAGNOSIS — L97322 Non-pressure chronic ulcer of left ankle with fat layer exposed: Secondary | ICD-10-CM | POA: Diagnosis not present

## 2024-02-18 DIAGNOSIS — Z7982 Long term (current) use of aspirin: Secondary | ICD-10-CM | POA: Diagnosis not present

## 2024-02-18 DIAGNOSIS — I10 Essential (primary) hypertension: Secondary | ICD-10-CM | POA: Diagnosis not present

## 2024-02-18 DIAGNOSIS — F321 Major depressive disorder, single episode, moderate: Secondary | ICD-10-CM | POA: Diagnosis not present

## 2024-02-18 DIAGNOSIS — I890 Lymphedema, not elsewhere classified: Secondary | ICD-10-CM | POA: Diagnosis not present

## 2024-02-18 DIAGNOSIS — I739 Peripheral vascular disease, unspecified: Secondary | ICD-10-CM | POA: Diagnosis not present

## 2024-02-18 DIAGNOSIS — I700 Atherosclerosis of aorta: Secondary | ICD-10-CM | POA: Diagnosis not present

## 2024-02-18 DIAGNOSIS — J449 Chronic obstructive pulmonary disease, unspecified: Secondary | ICD-10-CM | POA: Diagnosis not present

## 2024-02-18 DIAGNOSIS — D500 Iron deficiency anemia secondary to blood loss (chronic): Secondary | ICD-10-CM | POA: Diagnosis not present

## 2024-02-18 DIAGNOSIS — F419 Anxiety disorder, unspecified: Secondary | ICD-10-CM | POA: Diagnosis not present

## 2024-02-18 DIAGNOSIS — M810 Age-related osteoporosis without current pathological fracture: Secondary | ICD-10-CM | POA: Diagnosis not present

## 2024-02-21 DIAGNOSIS — J449 Chronic obstructive pulmonary disease, unspecified: Secondary | ICD-10-CM | POA: Diagnosis not present

## 2024-02-21 DIAGNOSIS — F321 Major depressive disorder, single episode, moderate: Secondary | ICD-10-CM | POA: Diagnosis not present

## 2024-02-21 DIAGNOSIS — L97322 Non-pressure chronic ulcer of left ankle with fat layer exposed: Secondary | ICD-10-CM | POA: Diagnosis not present

## 2024-02-21 DIAGNOSIS — I10 Essential (primary) hypertension: Secondary | ICD-10-CM | POA: Diagnosis not present

## 2024-02-21 DIAGNOSIS — Z8616 Personal history of COVID-19: Secondary | ICD-10-CM | POA: Diagnosis not present

## 2024-02-21 DIAGNOSIS — F419 Anxiety disorder, unspecified: Secondary | ICD-10-CM | POA: Diagnosis not present

## 2024-02-21 DIAGNOSIS — I739 Peripheral vascular disease, unspecified: Secondary | ICD-10-CM | POA: Diagnosis not present

## 2024-02-21 DIAGNOSIS — I890 Lymphedema, not elsewhere classified: Secondary | ICD-10-CM | POA: Diagnosis not present

## 2024-02-21 DIAGNOSIS — D500 Iron deficiency anemia secondary to blood loss (chronic): Secondary | ICD-10-CM | POA: Diagnosis not present

## 2024-02-21 DIAGNOSIS — M810 Age-related osteoporosis without current pathological fracture: Secondary | ICD-10-CM | POA: Diagnosis not present

## 2024-02-21 DIAGNOSIS — I700 Atherosclerosis of aorta: Secondary | ICD-10-CM | POA: Diagnosis not present

## 2024-02-21 DIAGNOSIS — Z7982 Long term (current) use of aspirin: Secondary | ICD-10-CM | POA: Diagnosis not present

## 2024-02-21 DIAGNOSIS — I872 Venous insufficiency (chronic) (peripheral): Secondary | ICD-10-CM | POA: Diagnosis not present

## 2024-02-24 DIAGNOSIS — L97322 Non-pressure chronic ulcer of left ankle with fat layer exposed: Secondary | ICD-10-CM | POA: Diagnosis not present

## 2024-02-24 DIAGNOSIS — J449 Chronic obstructive pulmonary disease, unspecified: Secondary | ICD-10-CM | POA: Diagnosis not present

## 2024-02-24 DIAGNOSIS — I739 Peripheral vascular disease, unspecified: Secondary | ICD-10-CM | POA: Diagnosis not present

## 2024-02-24 DIAGNOSIS — I890 Lymphedema, not elsewhere classified: Secondary | ICD-10-CM | POA: Diagnosis not present

## 2024-02-24 DIAGNOSIS — I700 Atherosclerosis of aorta: Secondary | ICD-10-CM | POA: Diagnosis not present

## 2024-02-24 DIAGNOSIS — F321 Major depressive disorder, single episode, moderate: Secondary | ICD-10-CM | POA: Diagnosis not present

## 2024-02-24 DIAGNOSIS — F419 Anxiety disorder, unspecified: Secondary | ICD-10-CM | POA: Diagnosis not present

## 2024-02-24 DIAGNOSIS — I872 Venous insufficiency (chronic) (peripheral): Secondary | ICD-10-CM | POA: Diagnosis not present

## 2024-02-24 DIAGNOSIS — Z8616 Personal history of COVID-19: Secondary | ICD-10-CM | POA: Diagnosis not present

## 2024-02-24 DIAGNOSIS — M810 Age-related osteoporosis without current pathological fracture: Secondary | ICD-10-CM | POA: Diagnosis not present

## 2024-02-24 DIAGNOSIS — Z7982 Long term (current) use of aspirin: Secondary | ICD-10-CM | POA: Diagnosis not present

## 2024-02-24 DIAGNOSIS — I10 Essential (primary) hypertension: Secondary | ICD-10-CM | POA: Diagnosis not present

## 2024-02-24 DIAGNOSIS — D500 Iron deficiency anemia secondary to blood loss (chronic): Secondary | ICD-10-CM | POA: Diagnosis not present

## 2024-02-29 DIAGNOSIS — I739 Peripheral vascular disease, unspecified: Secondary | ICD-10-CM | POA: Diagnosis not present

## 2024-02-29 DIAGNOSIS — F321 Major depressive disorder, single episode, moderate: Secondary | ICD-10-CM | POA: Diagnosis not present

## 2024-02-29 DIAGNOSIS — I890 Lymphedema, not elsewhere classified: Secondary | ICD-10-CM | POA: Diagnosis not present

## 2024-02-29 DIAGNOSIS — J449 Chronic obstructive pulmonary disease, unspecified: Secondary | ICD-10-CM | POA: Diagnosis not present

## 2024-02-29 DIAGNOSIS — I10 Essential (primary) hypertension: Secondary | ICD-10-CM | POA: Diagnosis not present

## 2024-02-29 DIAGNOSIS — L97322 Non-pressure chronic ulcer of left ankle with fat layer exposed: Secondary | ICD-10-CM | POA: Diagnosis not present

## 2024-02-29 DIAGNOSIS — I700 Atherosclerosis of aorta: Secondary | ICD-10-CM | POA: Diagnosis not present

## 2024-02-29 DIAGNOSIS — M810 Age-related osteoporosis without current pathological fracture: Secondary | ICD-10-CM | POA: Diagnosis not present

## 2024-02-29 DIAGNOSIS — F419 Anxiety disorder, unspecified: Secondary | ICD-10-CM | POA: Diagnosis not present

## 2024-02-29 DIAGNOSIS — Z8616 Personal history of COVID-19: Secondary | ICD-10-CM | POA: Diagnosis not present

## 2024-02-29 DIAGNOSIS — I872 Venous insufficiency (chronic) (peripheral): Secondary | ICD-10-CM | POA: Diagnosis not present

## 2024-02-29 DIAGNOSIS — Z7982 Long term (current) use of aspirin: Secondary | ICD-10-CM | POA: Diagnosis not present

## 2024-02-29 DIAGNOSIS — D500 Iron deficiency anemia secondary to blood loss (chronic): Secondary | ICD-10-CM | POA: Diagnosis not present

## 2024-03-02 DIAGNOSIS — D500 Iron deficiency anemia secondary to blood loss (chronic): Secondary | ICD-10-CM | POA: Diagnosis not present

## 2024-03-02 DIAGNOSIS — I890 Lymphedema, not elsewhere classified: Secondary | ICD-10-CM | POA: Diagnosis not present

## 2024-03-02 DIAGNOSIS — I872 Venous insufficiency (chronic) (peripheral): Secondary | ICD-10-CM | POA: Diagnosis not present

## 2024-03-02 DIAGNOSIS — I10 Essential (primary) hypertension: Secondary | ICD-10-CM | POA: Diagnosis not present

## 2024-03-02 DIAGNOSIS — J449 Chronic obstructive pulmonary disease, unspecified: Secondary | ICD-10-CM | POA: Diagnosis not present

## 2024-03-02 DIAGNOSIS — M810 Age-related osteoporosis without current pathological fracture: Secondary | ICD-10-CM | POA: Diagnosis not present

## 2024-03-02 DIAGNOSIS — L97322 Non-pressure chronic ulcer of left ankle with fat layer exposed: Secondary | ICD-10-CM | POA: Diagnosis not present

## 2024-03-02 DIAGNOSIS — F321 Major depressive disorder, single episode, moderate: Secondary | ICD-10-CM | POA: Diagnosis not present

## 2024-03-02 DIAGNOSIS — Z7982 Long term (current) use of aspirin: Secondary | ICD-10-CM | POA: Diagnosis not present

## 2024-03-02 DIAGNOSIS — Z8616 Personal history of COVID-19: Secondary | ICD-10-CM | POA: Diagnosis not present

## 2024-03-02 DIAGNOSIS — I739 Peripheral vascular disease, unspecified: Secondary | ICD-10-CM | POA: Diagnosis not present

## 2024-03-02 DIAGNOSIS — I700 Atherosclerosis of aorta: Secondary | ICD-10-CM | POA: Diagnosis not present

## 2024-03-02 DIAGNOSIS — F419 Anxiety disorder, unspecified: Secondary | ICD-10-CM | POA: Diagnosis not present

## 2024-03-06 DIAGNOSIS — I872 Venous insufficiency (chronic) (peripheral): Secondary | ICD-10-CM | POA: Diagnosis not present

## 2024-03-06 DIAGNOSIS — L97322 Non-pressure chronic ulcer of left ankle with fat layer exposed: Secondary | ICD-10-CM | POA: Diagnosis not present

## 2024-03-06 DIAGNOSIS — Z7982 Long term (current) use of aspirin: Secondary | ICD-10-CM | POA: Diagnosis not present

## 2024-03-06 DIAGNOSIS — I700 Atherosclerosis of aorta: Secondary | ICD-10-CM | POA: Diagnosis not present

## 2024-03-06 DIAGNOSIS — I890 Lymphedema, not elsewhere classified: Secondary | ICD-10-CM | POA: Diagnosis not present

## 2024-03-06 DIAGNOSIS — F419 Anxiety disorder, unspecified: Secondary | ICD-10-CM | POA: Diagnosis not present

## 2024-03-06 DIAGNOSIS — I10 Essential (primary) hypertension: Secondary | ICD-10-CM | POA: Diagnosis not present

## 2024-03-06 DIAGNOSIS — D500 Iron deficiency anemia secondary to blood loss (chronic): Secondary | ICD-10-CM | POA: Diagnosis not present

## 2024-03-06 DIAGNOSIS — I739 Peripheral vascular disease, unspecified: Secondary | ICD-10-CM | POA: Diagnosis not present

## 2024-03-06 DIAGNOSIS — Z8616 Personal history of COVID-19: Secondary | ICD-10-CM | POA: Diagnosis not present

## 2024-03-06 DIAGNOSIS — F321 Major depressive disorder, single episode, moderate: Secondary | ICD-10-CM | POA: Diagnosis not present

## 2024-03-06 DIAGNOSIS — J449 Chronic obstructive pulmonary disease, unspecified: Secondary | ICD-10-CM | POA: Diagnosis not present

## 2024-03-06 DIAGNOSIS — M810 Age-related osteoporosis without current pathological fracture: Secondary | ICD-10-CM | POA: Diagnosis not present

## 2024-03-09 DIAGNOSIS — I739 Peripheral vascular disease, unspecified: Secondary | ICD-10-CM | POA: Diagnosis not present

## 2024-03-09 DIAGNOSIS — M810 Age-related osteoporosis without current pathological fracture: Secondary | ICD-10-CM | POA: Diagnosis not present

## 2024-03-09 DIAGNOSIS — F419 Anxiety disorder, unspecified: Secondary | ICD-10-CM | POA: Diagnosis not present

## 2024-03-09 DIAGNOSIS — I10 Essential (primary) hypertension: Secondary | ICD-10-CM | POA: Diagnosis not present

## 2024-03-09 DIAGNOSIS — F321 Major depressive disorder, single episode, moderate: Secondary | ICD-10-CM | POA: Diagnosis not present

## 2024-03-09 DIAGNOSIS — I700 Atherosclerosis of aorta: Secondary | ICD-10-CM | POA: Diagnosis not present

## 2024-03-09 DIAGNOSIS — I890 Lymphedema, not elsewhere classified: Secondary | ICD-10-CM | POA: Diagnosis not present

## 2024-03-09 DIAGNOSIS — Z8616 Personal history of COVID-19: Secondary | ICD-10-CM | POA: Diagnosis not present

## 2024-03-09 DIAGNOSIS — D500 Iron deficiency anemia secondary to blood loss (chronic): Secondary | ICD-10-CM | POA: Diagnosis not present

## 2024-03-09 DIAGNOSIS — I872 Venous insufficiency (chronic) (peripheral): Secondary | ICD-10-CM | POA: Diagnosis not present

## 2024-03-09 DIAGNOSIS — L97322 Non-pressure chronic ulcer of left ankle with fat layer exposed: Secondary | ICD-10-CM | POA: Diagnosis not present

## 2024-03-09 DIAGNOSIS — Z7982 Long term (current) use of aspirin: Secondary | ICD-10-CM | POA: Diagnosis not present

## 2024-03-09 DIAGNOSIS — J449 Chronic obstructive pulmonary disease, unspecified: Secondary | ICD-10-CM | POA: Diagnosis not present

## 2024-03-11 DIAGNOSIS — R112 Nausea with vomiting, unspecified: Secondary | ICD-10-CM | POA: Diagnosis not present

## 2024-03-11 DIAGNOSIS — Z1152 Encounter for screening for COVID-19: Secondary | ICD-10-CM | POA: Diagnosis not present

## 2024-03-11 DIAGNOSIS — Z7722 Contact with and (suspected) exposure to environmental tobacco smoke (acute) (chronic): Secondary | ICD-10-CM | POA: Diagnosis not present

## 2024-03-11 DIAGNOSIS — Z7983 Long term (current) use of bisphosphonates: Secondary | ICD-10-CM | POA: Diagnosis not present

## 2024-03-11 DIAGNOSIS — R1111 Vomiting without nausea: Secondary | ICD-10-CM | POA: Diagnosis not present

## 2024-03-11 DIAGNOSIS — J449 Chronic obstructive pulmonary disease, unspecified: Secondary | ICD-10-CM | POA: Diagnosis not present

## 2024-03-11 DIAGNOSIS — R197 Diarrhea, unspecified: Secondary | ICD-10-CM | POA: Diagnosis not present

## 2024-03-11 DIAGNOSIS — E876 Hypokalemia: Secondary | ICD-10-CM | POA: Diagnosis not present

## 2024-03-11 DIAGNOSIS — K219 Gastro-esophageal reflux disease without esophagitis: Secondary | ICD-10-CM | POA: Diagnosis not present

## 2024-03-11 DIAGNOSIS — I10 Essential (primary) hypertension: Secondary | ICD-10-CM | POA: Diagnosis not present

## 2024-03-11 DIAGNOSIS — Z87891 Personal history of nicotine dependence: Secondary | ICD-10-CM | POA: Diagnosis not present

## 2024-03-11 DIAGNOSIS — Z20822 Contact with and (suspected) exposure to covid-19: Secondary | ICD-10-CM | POA: Diagnosis not present

## 2024-03-11 DIAGNOSIS — Z880 Allergy status to penicillin: Secondary | ICD-10-CM | POA: Diagnosis not present

## 2024-03-14 DIAGNOSIS — F321 Major depressive disorder, single episode, moderate: Secondary | ICD-10-CM | POA: Diagnosis not present

## 2024-03-14 DIAGNOSIS — M810 Age-related osteoporosis without current pathological fracture: Secondary | ICD-10-CM | POA: Diagnosis not present

## 2024-03-14 DIAGNOSIS — I700 Atherosclerosis of aorta: Secondary | ICD-10-CM | POA: Diagnosis not present

## 2024-03-14 DIAGNOSIS — D500 Iron deficiency anemia secondary to blood loss (chronic): Secondary | ICD-10-CM | POA: Diagnosis not present

## 2024-03-14 DIAGNOSIS — I872 Venous insufficiency (chronic) (peripheral): Secondary | ICD-10-CM | POA: Diagnosis not present

## 2024-03-14 DIAGNOSIS — Z7982 Long term (current) use of aspirin: Secondary | ICD-10-CM | POA: Diagnosis not present

## 2024-03-14 DIAGNOSIS — I739 Peripheral vascular disease, unspecified: Secondary | ICD-10-CM | POA: Diagnosis not present

## 2024-03-14 DIAGNOSIS — I890 Lymphedema, not elsewhere classified: Secondary | ICD-10-CM | POA: Diagnosis not present

## 2024-03-14 DIAGNOSIS — Z8616 Personal history of COVID-19: Secondary | ICD-10-CM | POA: Diagnosis not present

## 2024-03-14 DIAGNOSIS — I10 Essential (primary) hypertension: Secondary | ICD-10-CM | POA: Diagnosis not present

## 2024-03-14 DIAGNOSIS — F419 Anxiety disorder, unspecified: Secondary | ICD-10-CM | POA: Diagnosis not present

## 2024-03-14 DIAGNOSIS — J449 Chronic obstructive pulmonary disease, unspecified: Secondary | ICD-10-CM | POA: Diagnosis not present

## 2024-03-14 DIAGNOSIS — L97322 Non-pressure chronic ulcer of left ankle with fat layer exposed: Secondary | ICD-10-CM | POA: Diagnosis not present

## 2024-03-21 DIAGNOSIS — J449 Chronic obstructive pulmonary disease, unspecified: Secondary | ICD-10-CM | POA: Diagnosis not present

## 2024-03-21 DIAGNOSIS — Z7982 Long term (current) use of aspirin: Secondary | ICD-10-CM | POA: Diagnosis not present

## 2024-03-21 DIAGNOSIS — I872 Venous insufficiency (chronic) (peripheral): Secondary | ICD-10-CM | POA: Diagnosis not present

## 2024-03-21 DIAGNOSIS — I10 Essential (primary) hypertension: Secondary | ICD-10-CM | POA: Diagnosis not present

## 2024-03-21 DIAGNOSIS — I739 Peripheral vascular disease, unspecified: Secondary | ICD-10-CM | POA: Diagnosis not present

## 2024-03-21 DIAGNOSIS — M810 Age-related osteoporosis without current pathological fracture: Secondary | ICD-10-CM | POA: Diagnosis not present

## 2024-03-21 DIAGNOSIS — F321 Major depressive disorder, single episode, moderate: Secondary | ICD-10-CM | POA: Diagnosis not present

## 2024-03-21 DIAGNOSIS — I700 Atherosclerosis of aorta: Secondary | ICD-10-CM | POA: Diagnosis not present

## 2024-03-21 DIAGNOSIS — Z8616 Personal history of COVID-19: Secondary | ICD-10-CM | POA: Diagnosis not present

## 2024-03-21 DIAGNOSIS — L97322 Non-pressure chronic ulcer of left ankle with fat layer exposed: Secondary | ICD-10-CM | POA: Diagnosis not present

## 2024-03-21 DIAGNOSIS — I890 Lymphedema, not elsewhere classified: Secondary | ICD-10-CM | POA: Diagnosis not present

## 2024-03-21 DIAGNOSIS — D500 Iron deficiency anemia secondary to blood loss (chronic): Secondary | ICD-10-CM | POA: Diagnosis not present

## 2024-03-21 DIAGNOSIS — F419 Anxiety disorder, unspecified: Secondary | ICD-10-CM | POA: Diagnosis not present

## 2024-03-27 DIAGNOSIS — F419 Anxiety disorder, unspecified: Secondary | ICD-10-CM | POA: Diagnosis not present

## 2024-03-27 DIAGNOSIS — J449 Chronic obstructive pulmonary disease, unspecified: Secondary | ICD-10-CM | POA: Diagnosis not present

## 2024-03-27 DIAGNOSIS — L97322 Non-pressure chronic ulcer of left ankle with fat layer exposed: Secondary | ICD-10-CM | POA: Diagnosis not present

## 2024-03-27 DIAGNOSIS — I700 Atherosclerosis of aorta: Secondary | ICD-10-CM | POA: Diagnosis not present

## 2024-03-27 DIAGNOSIS — M810 Age-related osteoporosis without current pathological fracture: Secondary | ICD-10-CM | POA: Diagnosis not present

## 2024-03-27 DIAGNOSIS — I10 Essential (primary) hypertension: Secondary | ICD-10-CM | POA: Diagnosis not present

## 2024-03-27 DIAGNOSIS — Z8616 Personal history of COVID-19: Secondary | ICD-10-CM | POA: Diagnosis not present

## 2024-03-27 DIAGNOSIS — I890 Lymphedema, not elsewhere classified: Secondary | ICD-10-CM | POA: Diagnosis not present

## 2024-03-27 DIAGNOSIS — I739 Peripheral vascular disease, unspecified: Secondary | ICD-10-CM | POA: Diagnosis not present

## 2024-03-27 DIAGNOSIS — F321 Major depressive disorder, single episode, moderate: Secondary | ICD-10-CM | POA: Diagnosis not present

## 2024-03-27 DIAGNOSIS — Z7982 Long term (current) use of aspirin: Secondary | ICD-10-CM | POA: Diagnosis not present

## 2024-03-27 DIAGNOSIS — D500 Iron deficiency anemia secondary to blood loss (chronic): Secondary | ICD-10-CM | POA: Diagnosis not present

## 2024-03-27 DIAGNOSIS — I872 Venous insufficiency (chronic) (peripheral): Secondary | ICD-10-CM | POA: Diagnosis not present

## 2024-04-03 DIAGNOSIS — M810 Age-related osteoporosis without current pathological fracture: Secondary | ICD-10-CM | POA: Diagnosis not present

## 2024-04-03 DIAGNOSIS — I739 Peripheral vascular disease, unspecified: Secondary | ICD-10-CM | POA: Diagnosis not present

## 2024-04-03 DIAGNOSIS — I700 Atherosclerosis of aorta: Secondary | ICD-10-CM | POA: Diagnosis not present

## 2024-04-03 DIAGNOSIS — I872 Venous insufficiency (chronic) (peripheral): Secondary | ICD-10-CM | POA: Diagnosis not present

## 2024-04-03 DIAGNOSIS — D500 Iron deficiency anemia secondary to blood loss (chronic): Secondary | ICD-10-CM | POA: Diagnosis not present

## 2024-04-03 DIAGNOSIS — Z8616 Personal history of COVID-19: Secondary | ICD-10-CM | POA: Diagnosis not present

## 2024-04-03 DIAGNOSIS — I890 Lymphedema, not elsewhere classified: Secondary | ICD-10-CM | POA: Diagnosis not present

## 2024-04-03 DIAGNOSIS — L97322 Non-pressure chronic ulcer of left ankle with fat layer exposed: Secondary | ICD-10-CM | POA: Diagnosis not present

## 2024-04-03 DIAGNOSIS — I10 Essential (primary) hypertension: Secondary | ICD-10-CM | POA: Diagnosis not present

## 2024-04-03 DIAGNOSIS — F321 Major depressive disorder, single episode, moderate: Secondary | ICD-10-CM | POA: Diagnosis not present

## 2024-04-03 DIAGNOSIS — F419 Anxiety disorder, unspecified: Secondary | ICD-10-CM | POA: Diagnosis not present

## 2024-04-03 DIAGNOSIS — J449 Chronic obstructive pulmonary disease, unspecified: Secondary | ICD-10-CM | POA: Diagnosis not present

## 2024-04-03 DIAGNOSIS — Z7982 Long term (current) use of aspirin: Secondary | ICD-10-CM | POA: Diagnosis not present

## 2024-04-05 DIAGNOSIS — X58XXXA Exposure to other specified factors, initial encounter: Secondary | ICD-10-CM | POA: Diagnosis not present

## 2024-04-05 DIAGNOSIS — L84 Corns and callosities: Secondary | ICD-10-CM | POA: Diagnosis not present

## 2024-04-05 DIAGNOSIS — I890 Lymphedema, not elsewhere classified: Secondary | ICD-10-CM | POA: Diagnosis not present

## 2024-04-05 DIAGNOSIS — L97222 Non-pressure chronic ulcer of left calf with fat layer exposed: Secondary | ICD-10-CM | POA: Diagnosis not present

## 2024-04-05 DIAGNOSIS — I872 Venous insufficiency (chronic) (peripheral): Secondary | ICD-10-CM | POA: Diagnosis not present

## 2024-04-11 DIAGNOSIS — M810 Age-related osteoporosis without current pathological fracture: Secondary | ICD-10-CM | POA: Diagnosis not present

## 2024-04-11 DIAGNOSIS — I739 Peripheral vascular disease, unspecified: Secondary | ICD-10-CM | POA: Diagnosis not present

## 2024-04-11 DIAGNOSIS — I10 Essential (primary) hypertension: Secondary | ICD-10-CM | POA: Diagnosis not present

## 2024-04-11 DIAGNOSIS — F321 Major depressive disorder, single episode, moderate: Secondary | ICD-10-CM | POA: Diagnosis not present

## 2024-04-11 DIAGNOSIS — Z8616 Personal history of COVID-19: Secondary | ICD-10-CM | POA: Diagnosis not present

## 2024-04-11 DIAGNOSIS — F419 Anxiety disorder, unspecified: Secondary | ICD-10-CM | POA: Diagnosis not present

## 2024-04-11 DIAGNOSIS — I700 Atherosclerosis of aorta: Secondary | ICD-10-CM | POA: Diagnosis not present

## 2024-04-11 DIAGNOSIS — I872 Venous insufficiency (chronic) (peripheral): Secondary | ICD-10-CM | POA: Diagnosis not present

## 2024-04-11 DIAGNOSIS — L97322 Non-pressure chronic ulcer of left ankle with fat layer exposed: Secondary | ICD-10-CM | POA: Diagnosis not present

## 2024-04-11 DIAGNOSIS — D500 Iron deficiency anemia secondary to blood loss (chronic): Secondary | ICD-10-CM | POA: Diagnosis not present

## 2024-04-11 DIAGNOSIS — J449 Chronic obstructive pulmonary disease, unspecified: Secondary | ICD-10-CM | POA: Diagnosis not present

## 2024-04-11 DIAGNOSIS — I890 Lymphedema, not elsewhere classified: Secondary | ICD-10-CM | POA: Diagnosis not present

## 2024-04-11 DIAGNOSIS — Z7982 Long term (current) use of aspirin: Secondary | ICD-10-CM | POA: Diagnosis not present

## 2024-04-14 DIAGNOSIS — M810 Age-related osteoporosis without current pathological fracture: Secondary | ICD-10-CM | POA: Diagnosis not present

## 2024-04-14 DIAGNOSIS — Z8616 Personal history of COVID-19: Secondary | ICD-10-CM | POA: Diagnosis not present

## 2024-04-14 DIAGNOSIS — Z7982 Long term (current) use of aspirin: Secondary | ICD-10-CM | POA: Diagnosis not present

## 2024-04-14 DIAGNOSIS — J449 Chronic obstructive pulmonary disease, unspecified: Secondary | ICD-10-CM | POA: Diagnosis not present

## 2024-04-14 DIAGNOSIS — F419 Anxiety disorder, unspecified: Secondary | ICD-10-CM | POA: Diagnosis not present

## 2024-04-14 DIAGNOSIS — I739 Peripheral vascular disease, unspecified: Secondary | ICD-10-CM | POA: Diagnosis not present

## 2024-04-14 DIAGNOSIS — I872 Venous insufficiency (chronic) (peripheral): Secondary | ICD-10-CM | POA: Diagnosis not present

## 2024-04-14 DIAGNOSIS — F321 Major depressive disorder, single episode, moderate: Secondary | ICD-10-CM | POA: Diagnosis not present

## 2024-04-14 DIAGNOSIS — L97322 Non-pressure chronic ulcer of left ankle with fat layer exposed: Secondary | ICD-10-CM | POA: Diagnosis not present

## 2024-04-14 DIAGNOSIS — I890 Lymphedema, not elsewhere classified: Secondary | ICD-10-CM | POA: Diagnosis not present

## 2024-04-14 DIAGNOSIS — I700 Atherosclerosis of aorta: Secondary | ICD-10-CM | POA: Diagnosis not present

## 2024-04-14 DIAGNOSIS — D500 Iron deficiency anemia secondary to blood loss (chronic): Secondary | ICD-10-CM | POA: Diagnosis not present

## 2024-04-14 DIAGNOSIS — I10 Essential (primary) hypertension: Secondary | ICD-10-CM | POA: Diagnosis not present

## 2024-04-17 DIAGNOSIS — F419 Anxiety disorder, unspecified: Secondary | ICD-10-CM | POA: Diagnosis not present

## 2024-04-17 DIAGNOSIS — L97322 Non-pressure chronic ulcer of left ankle with fat layer exposed: Secondary | ICD-10-CM | POA: Diagnosis not present

## 2024-04-17 DIAGNOSIS — I890 Lymphedema, not elsewhere classified: Secondary | ICD-10-CM | POA: Diagnosis not present

## 2024-04-17 DIAGNOSIS — Z8616 Personal history of COVID-19: Secondary | ICD-10-CM | POA: Diagnosis not present

## 2024-04-17 DIAGNOSIS — I872 Venous insufficiency (chronic) (peripheral): Secondary | ICD-10-CM | POA: Diagnosis not present

## 2024-04-17 DIAGNOSIS — I739 Peripheral vascular disease, unspecified: Secondary | ICD-10-CM | POA: Diagnosis not present

## 2024-04-17 DIAGNOSIS — B351 Tinea unguium: Secondary | ICD-10-CM | POA: Diagnosis not present

## 2024-04-17 DIAGNOSIS — M79675 Pain in left toe(s): Secondary | ICD-10-CM | POA: Diagnosis not present

## 2024-04-17 DIAGNOSIS — D500 Iron deficiency anemia secondary to blood loss (chronic): Secondary | ICD-10-CM | POA: Diagnosis not present

## 2024-04-17 DIAGNOSIS — M79674 Pain in right toe(s): Secondary | ICD-10-CM | POA: Diagnosis not present

## 2024-04-17 DIAGNOSIS — F321 Major depressive disorder, single episode, moderate: Secondary | ICD-10-CM | POA: Diagnosis not present

## 2024-04-17 DIAGNOSIS — L851 Acquired keratosis [keratoderma] palmaris et plantaris: Secondary | ICD-10-CM | POA: Diagnosis not present

## 2024-04-17 DIAGNOSIS — I700 Atherosclerosis of aorta: Secondary | ICD-10-CM | POA: Diagnosis not present

## 2024-04-17 DIAGNOSIS — J449 Chronic obstructive pulmonary disease, unspecified: Secondary | ICD-10-CM | POA: Diagnosis not present

## 2024-04-17 DIAGNOSIS — M810 Age-related osteoporosis without current pathological fracture: Secondary | ICD-10-CM | POA: Diagnosis not present

## 2024-04-17 DIAGNOSIS — Z7982 Long term (current) use of aspirin: Secondary | ICD-10-CM | POA: Diagnosis not present

## 2024-04-17 DIAGNOSIS — I10 Essential (primary) hypertension: Secondary | ICD-10-CM | POA: Diagnosis not present

## 2024-04-20 DIAGNOSIS — Z7982 Long term (current) use of aspirin: Secondary | ICD-10-CM | POA: Diagnosis not present

## 2024-04-20 DIAGNOSIS — F419 Anxiety disorder, unspecified: Secondary | ICD-10-CM | POA: Diagnosis not present

## 2024-04-20 DIAGNOSIS — I872 Venous insufficiency (chronic) (peripheral): Secondary | ICD-10-CM | POA: Diagnosis not present

## 2024-04-20 DIAGNOSIS — I10 Essential (primary) hypertension: Secondary | ICD-10-CM | POA: Diagnosis not present

## 2024-04-20 DIAGNOSIS — J449 Chronic obstructive pulmonary disease, unspecified: Secondary | ICD-10-CM | POA: Diagnosis not present

## 2024-04-20 DIAGNOSIS — I739 Peripheral vascular disease, unspecified: Secondary | ICD-10-CM | POA: Diagnosis not present

## 2024-04-20 DIAGNOSIS — L97322 Non-pressure chronic ulcer of left ankle with fat layer exposed: Secondary | ICD-10-CM | POA: Diagnosis not present

## 2024-04-20 DIAGNOSIS — Z8616 Personal history of COVID-19: Secondary | ICD-10-CM | POA: Diagnosis not present

## 2024-04-20 DIAGNOSIS — D500 Iron deficiency anemia secondary to blood loss (chronic): Secondary | ICD-10-CM | POA: Diagnosis not present

## 2024-04-20 DIAGNOSIS — I700 Atherosclerosis of aorta: Secondary | ICD-10-CM | POA: Diagnosis not present

## 2024-04-20 DIAGNOSIS — F321 Major depressive disorder, single episode, moderate: Secondary | ICD-10-CM | POA: Diagnosis not present

## 2024-04-20 DIAGNOSIS — M810 Age-related osteoporosis without current pathological fracture: Secondary | ICD-10-CM | POA: Diagnosis not present

## 2024-04-20 DIAGNOSIS — I890 Lymphedema, not elsewhere classified: Secondary | ICD-10-CM | POA: Diagnosis not present

## 2024-04-24 DIAGNOSIS — I872 Venous insufficiency (chronic) (peripheral): Secondary | ICD-10-CM | POA: Diagnosis not present

## 2024-04-24 DIAGNOSIS — I83009 Varicose veins of unspecified lower extremity with ulcer of unspecified site: Secondary | ICD-10-CM | POA: Diagnosis not present

## 2024-04-25 DIAGNOSIS — F321 Major depressive disorder, single episode, moderate: Secondary | ICD-10-CM | POA: Diagnosis not present

## 2024-04-25 DIAGNOSIS — I890 Lymphedema, not elsewhere classified: Secondary | ICD-10-CM | POA: Diagnosis not present

## 2024-04-25 DIAGNOSIS — Z8616 Personal history of COVID-19: Secondary | ICD-10-CM | POA: Diagnosis not present

## 2024-04-25 DIAGNOSIS — M810 Age-related osteoporosis without current pathological fracture: Secondary | ICD-10-CM | POA: Diagnosis not present

## 2024-04-25 DIAGNOSIS — I739 Peripheral vascular disease, unspecified: Secondary | ICD-10-CM | POA: Diagnosis not present

## 2024-04-25 DIAGNOSIS — J449 Chronic obstructive pulmonary disease, unspecified: Secondary | ICD-10-CM | POA: Diagnosis not present

## 2024-04-25 DIAGNOSIS — F419 Anxiety disorder, unspecified: Secondary | ICD-10-CM | POA: Diagnosis not present

## 2024-04-25 DIAGNOSIS — I700 Atherosclerosis of aorta: Secondary | ICD-10-CM | POA: Diagnosis not present

## 2024-04-25 DIAGNOSIS — L97322 Non-pressure chronic ulcer of left ankle with fat layer exposed: Secondary | ICD-10-CM | POA: Diagnosis not present

## 2024-04-25 DIAGNOSIS — D500 Iron deficiency anemia secondary to blood loss (chronic): Secondary | ICD-10-CM | POA: Diagnosis not present

## 2024-04-25 DIAGNOSIS — I10 Essential (primary) hypertension: Secondary | ICD-10-CM | POA: Diagnosis not present

## 2024-04-25 DIAGNOSIS — I872 Venous insufficiency (chronic) (peripheral): Secondary | ICD-10-CM | POA: Diagnosis not present

## 2024-04-25 DIAGNOSIS — Z7982 Long term (current) use of aspirin: Secondary | ICD-10-CM | POA: Diagnosis not present

## 2024-04-28 DIAGNOSIS — D500 Iron deficiency anemia secondary to blood loss (chronic): Secondary | ICD-10-CM | POA: Diagnosis not present

## 2024-04-28 DIAGNOSIS — F419 Anxiety disorder, unspecified: Secondary | ICD-10-CM | POA: Diagnosis not present

## 2024-04-28 DIAGNOSIS — I739 Peripheral vascular disease, unspecified: Secondary | ICD-10-CM | POA: Diagnosis not present

## 2024-04-28 DIAGNOSIS — I890 Lymphedema, not elsewhere classified: Secondary | ICD-10-CM | POA: Diagnosis not present

## 2024-04-28 DIAGNOSIS — M810 Age-related osteoporosis without current pathological fracture: Secondary | ICD-10-CM | POA: Diagnosis not present

## 2024-04-28 DIAGNOSIS — Z7982 Long term (current) use of aspirin: Secondary | ICD-10-CM | POA: Diagnosis not present

## 2024-04-28 DIAGNOSIS — Z8616 Personal history of COVID-19: Secondary | ICD-10-CM | POA: Diagnosis not present

## 2024-04-28 DIAGNOSIS — I872 Venous insufficiency (chronic) (peripheral): Secondary | ICD-10-CM | POA: Diagnosis not present

## 2024-04-28 DIAGNOSIS — L97322 Non-pressure chronic ulcer of left ankle with fat layer exposed: Secondary | ICD-10-CM | POA: Diagnosis not present

## 2024-04-28 DIAGNOSIS — I10 Essential (primary) hypertension: Secondary | ICD-10-CM | POA: Diagnosis not present

## 2024-04-28 DIAGNOSIS — F321 Major depressive disorder, single episode, moderate: Secondary | ICD-10-CM | POA: Diagnosis not present

## 2024-04-28 DIAGNOSIS — J449 Chronic obstructive pulmonary disease, unspecified: Secondary | ICD-10-CM | POA: Diagnosis not present

## 2024-04-28 DIAGNOSIS — I700 Atherosclerosis of aorta: Secondary | ICD-10-CM | POA: Diagnosis not present

## 2024-05-01 DIAGNOSIS — D500 Iron deficiency anemia secondary to blood loss (chronic): Secondary | ICD-10-CM | POA: Diagnosis not present

## 2024-05-01 DIAGNOSIS — I739 Peripheral vascular disease, unspecified: Secondary | ICD-10-CM | POA: Diagnosis not present

## 2024-05-01 DIAGNOSIS — J449 Chronic obstructive pulmonary disease, unspecified: Secondary | ICD-10-CM | POA: Diagnosis not present

## 2024-05-01 DIAGNOSIS — M810 Age-related osteoporosis without current pathological fracture: Secondary | ICD-10-CM | POA: Diagnosis not present

## 2024-05-01 DIAGNOSIS — I10 Essential (primary) hypertension: Secondary | ICD-10-CM | POA: Diagnosis not present

## 2024-05-01 DIAGNOSIS — F321 Major depressive disorder, single episode, moderate: Secondary | ICD-10-CM | POA: Diagnosis not present

## 2024-05-01 DIAGNOSIS — I700 Atherosclerosis of aorta: Secondary | ICD-10-CM | POA: Diagnosis not present

## 2024-05-01 DIAGNOSIS — Z7982 Long term (current) use of aspirin: Secondary | ICD-10-CM | POA: Diagnosis not present

## 2024-05-01 DIAGNOSIS — I890 Lymphedema, not elsewhere classified: Secondary | ICD-10-CM | POA: Diagnosis not present

## 2024-05-01 DIAGNOSIS — F419 Anxiety disorder, unspecified: Secondary | ICD-10-CM | POA: Diagnosis not present

## 2024-05-01 DIAGNOSIS — L97322 Non-pressure chronic ulcer of left ankle with fat layer exposed: Secondary | ICD-10-CM | POA: Diagnosis not present

## 2024-05-01 DIAGNOSIS — I872 Venous insufficiency (chronic) (peripheral): Secondary | ICD-10-CM | POA: Diagnosis not present

## 2024-05-01 DIAGNOSIS — Z8616 Personal history of COVID-19: Secondary | ICD-10-CM | POA: Diagnosis not present

## 2024-05-03 DIAGNOSIS — M810 Age-related osteoporosis without current pathological fracture: Secondary | ICD-10-CM | POA: Diagnosis not present

## 2024-05-03 DIAGNOSIS — Z8616 Personal history of COVID-19: Secondary | ICD-10-CM | POA: Diagnosis not present

## 2024-05-03 DIAGNOSIS — I10 Essential (primary) hypertension: Secondary | ICD-10-CM | POA: Diagnosis not present

## 2024-05-03 DIAGNOSIS — I890 Lymphedema, not elsewhere classified: Secondary | ICD-10-CM | POA: Diagnosis not present

## 2024-05-03 DIAGNOSIS — I872 Venous insufficiency (chronic) (peripheral): Secondary | ICD-10-CM | POA: Diagnosis not present

## 2024-05-03 DIAGNOSIS — L97322 Non-pressure chronic ulcer of left ankle with fat layer exposed: Secondary | ICD-10-CM | POA: Diagnosis not present

## 2024-05-03 DIAGNOSIS — I700 Atherosclerosis of aorta: Secondary | ICD-10-CM | POA: Diagnosis not present

## 2024-05-03 DIAGNOSIS — I739 Peripheral vascular disease, unspecified: Secondary | ICD-10-CM | POA: Diagnosis not present

## 2024-05-03 DIAGNOSIS — J449 Chronic obstructive pulmonary disease, unspecified: Secondary | ICD-10-CM | POA: Diagnosis not present

## 2024-05-03 DIAGNOSIS — D500 Iron deficiency anemia secondary to blood loss (chronic): Secondary | ICD-10-CM | POA: Diagnosis not present

## 2024-05-03 DIAGNOSIS — Z7982 Long term (current) use of aspirin: Secondary | ICD-10-CM | POA: Diagnosis not present

## 2024-05-03 DIAGNOSIS — F321 Major depressive disorder, single episode, moderate: Secondary | ICD-10-CM | POA: Diagnosis not present

## 2024-05-03 DIAGNOSIS — F419 Anxiety disorder, unspecified: Secondary | ICD-10-CM | POA: Diagnosis not present

## 2024-05-08 DIAGNOSIS — I872 Venous insufficiency (chronic) (peripheral): Secondary | ICD-10-CM | POA: Diagnosis not present

## 2024-05-08 DIAGNOSIS — I83009 Varicose veins of unspecified lower extremity with ulcer of unspecified site: Secondary | ICD-10-CM | POA: Diagnosis not present

## 2024-05-10 DIAGNOSIS — D500 Iron deficiency anemia secondary to blood loss (chronic): Secondary | ICD-10-CM | POA: Diagnosis not present

## 2024-05-10 DIAGNOSIS — I10 Essential (primary) hypertension: Secondary | ICD-10-CM | POA: Diagnosis not present

## 2024-05-10 DIAGNOSIS — F321 Major depressive disorder, single episode, moderate: Secondary | ICD-10-CM | POA: Diagnosis not present

## 2024-05-10 DIAGNOSIS — F419 Anxiety disorder, unspecified: Secondary | ICD-10-CM | POA: Diagnosis not present

## 2024-05-10 DIAGNOSIS — Z7982 Long term (current) use of aspirin: Secondary | ICD-10-CM | POA: Diagnosis not present

## 2024-05-10 DIAGNOSIS — M810 Age-related osteoporosis without current pathological fracture: Secondary | ICD-10-CM | POA: Diagnosis not present

## 2024-05-10 DIAGNOSIS — L97322 Non-pressure chronic ulcer of left ankle with fat layer exposed: Secondary | ICD-10-CM | POA: Diagnosis not present

## 2024-05-10 DIAGNOSIS — Z8616 Personal history of COVID-19: Secondary | ICD-10-CM | POA: Diagnosis not present

## 2024-05-10 DIAGNOSIS — I872 Venous insufficiency (chronic) (peripheral): Secondary | ICD-10-CM | POA: Diagnosis not present

## 2024-05-10 DIAGNOSIS — I700 Atherosclerosis of aorta: Secondary | ICD-10-CM | POA: Diagnosis not present

## 2024-05-10 DIAGNOSIS — I739 Peripheral vascular disease, unspecified: Secondary | ICD-10-CM | POA: Diagnosis not present

## 2024-05-10 DIAGNOSIS — I890 Lymphedema, not elsewhere classified: Secondary | ICD-10-CM | POA: Diagnosis not present

## 2024-05-10 DIAGNOSIS — J449 Chronic obstructive pulmonary disease, unspecified: Secondary | ICD-10-CM | POA: Diagnosis not present

## 2024-05-12 DIAGNOSIS — D500 Iron deficiency anemia secondary to blood loss (chronic): Secondary | ICD-10-CM | POA: Diagnosis not present

## 2024-05-12 DIAGNOSIS — I700 Atherosclerosis of aorta: Secondary | ICD-10-CM | POA: Diagnosis not present

## 2024-05-12 DIAGNOSIS — I739 Peripheral vascular disease, unspecified: Secondary | ICD-10-CM | POA: Diagnosis not present

## 2024-05-12 DIAGNOSIS — F321 Major depressive disorder, single episode, moderate: Secondary | ICD-10-CM | POA: Diagnosis not present

## 2024-05-12 DIAGNOSIS — I890 Lymphedema, not elsewhere classified: Secondary | ICD-10-CM | POA: Diagnosis not present

## 2024-05-12 DIAGNOSIS — I10 Essential (primary) hypertension: Secondary | ICD-10-CM | POA: Diagnosis not present

## 2024-05-12 DIAGNOSIS — I872 Venous insufficiency (chronic) (peripheral): Secondary | ICD-10-CM | POA: Diagnosis not present

## 2024-05-12 DIAGNOSIS — M810 Age-related osteoporosis without current pathological fracture: Secondary | ICD-10-CM | POA: Diagnosis not present

## 2024-05-12 DIAGNOSIS — Z8616 Personal history of COVID-19: Secondary | ICD-10-CM | POA: Diagnosis not present

## 2024-05-12 DIAGNOSIS — F419 Anxiety disorder, unspecified: Secondary | ICD-10-CM | POA: Diagnosis not present

## 2024-05-12 DIAGNOSIS — L97322 Non-pressure chronic ulcer of left ankle with fat layer exposed: Secondary | ICD-10-CM | POA: Diagnosis not present

## 2024-05-12 DIAGNOSIS — J449 Chronic obstructive pulmonary disease, unspecified: Secondary | ICD-10-CM | POA: Diagnosis not present

## 2024-05-12 DIAGNOSIS — Z7982 Long term (current) use of aspirin: Secondary | ICD-10-CM | POA: Diagnosis not present

## 2024-05-15 DIAGNOSIS — I872 Venous insufficiency (chronic) (peripheral): Secondary | ICD-10-CM | POA: Diagnosis not present

## 2024-05-15 DIAGNOSIS — I83009 Varicose veins of unspecified lower extremity with ulcer of unspecified site: Secondary | ICD-10-CM | POA: Diagnosis not present

## 2024-05-16 DIAGNOSIS — L97322 Non-pressure chronic ulcer of left ankle with fat layer exposed: Secondary | ICD-10-CM | POA: Diagnosis not present

## 2024-05-16 DIAGNOSIS — I872 Venous insufficiency (chronic) (peripheral): Secondary | ICD-10-CM | POA: Diagnosis not present

## 2024-05-16 DIAGNOSIS — F321 Major depressive disorder, single episode, moderate: Secondary | ICD-10-CM | POA: Diagnosis not present

## 2024-05-16 DIAGNOSIS — M810 Age-related osteoporosis without current pathological fracture: Secondary | ICD-10-CM | POA: Diagnosis not present

## 2024-05-16 DIAGNOSIS — I10 Essential (primary) hypertension: Secondary | ICD-10-CM | POA: Diagnosis not present

## 2024-05-16 DIAGNOSIS — J449 Chronic obstructive pulmonary disease, unspecified: Secondary | ICD-10-CM | POA: Diagnosis not present

## 2024-05-16 DIAGNOSIS — I700 Atherosclerosis of aorta: Secondary | ICD-10-CM | POA: Diagnosis not present

## 2024-05-16 DIAGNOSIS — I739 Peripheral vascular disease, unspecified: Secondary | ICD-10-CM | POA: Diagnosis not present

## 2024-05-16 DIAGNOSIS — Z602 Problems related to living alone: Secondary | ICD-10-CM | POA: Diagnosis not present

## 2024-05-16 DIAGNOSIS — Z8616 Personal history of COVID-19: Secondary | ICD-10-CM | POA: Diagnosis not present

## 2024-05-16 DIAGNOSIS — I890 Lymphedema, not elsewhere classified: Secondary | ICD-10-CM | POA: Diagnosis not present

## 2024-05-16 DIAGNOSIS — Z7982 Long term (current) use of aspirin: Secondary | ICD-10-CM | POA: Diagnosis not present

## 2024-05-16 DIAGNOSIS — D500 Iron deficiency anemia secondary to blood loss (chronic): Secondary | ICD-10-CM | POA: Diagnosis not present

## 2024-05-16 DIAGNOSIS — F419 Anxiety disorder, unspecified: Secondary | ICD-10-CM | POA: Diagnosis not present

## 2024-05-17 DIAGNOSIS — H52223 Regular astigmatism, bilateral: Secondary | ICD-10-CM | POA: Diagnosis not present

## 2024-05-17 DIAGNOSIS — R200 Anesthesia of skin: Secondary | ICD-10-CM | POA: Diagnosis not present

## 2024-05-17 DIAGNOSIS — L280 Lichen simplex chronicus: Secondary | ICD-10-CM | POA: Diagnosis not present

## 2024-05-17 DIAGNOSIS — H5203 Hypermetropia, bilateral: Secondary | ICD-10-CM | POA: Diagnosis not present

## 2024-05-17 DIAGNOSIS — H524 Presbyopia: Secondary | ICD-10-CM | POA: Diagnosis not present

## 2024-05-17 DIAGNOSIS — H25813 Combined forms of age-related cataract, bilateral: Secondary | ICD-10-CM | POA: Diagnosis not present

## 2024-05-18 DIAGNOSIS — J449 Chronic obstructive pulmonary disease, unspecified: Secondary | ICD-10-CM | POA: Diagnosis not present

## 2024-05-18 DIAGNOSIS — I872 Venous insufficiency (chronic) (peripheral): Secondary | ICD-10-CM | POA: Diagnosis not present

## 2024-05-18 DIAGNOSIS — F321 Major depressive disorder, single episode, moderate: Secondary | ICD-10-CM | POA: Diagnosis not present

## 2024-05-18 DIAGNOSIS — I890 Lymphedema, not elsewhere classified: Secondary | ICD-10-CM | POA: Diagnosis not present

## 2024-05-18 DIAGNOSIS — D500 Iron deficiency anemia secondary to blood loss (chronic): Secondary | ICD-10-CM | POA: Diagnosis not present

## 2024-05-18 DIAGNOSIS — I10 Essential (primary) hypertension: Secondary | ICD-10-CM | POA: Diagnosis not present

## 2024-05-18 DIAGNOSIS — I700 Atherosclerosis of aorta: Secondary | ICD-10-CM | POA: Diagnosis not present

## 2024-05-18 DIAGNOSIS — Z602 Problems related to living alone: Secondary | ICD-10-CM | POA: Diagnosis not present

## 2024-05-18 DIAGNOSIS — I739 Peripheral vascular disease, unspecified: Secondary | ICD-10-CM | POA: Diagnosis not present

## 2024-05-18 DIAGNOSIS — M810 Age-related osteoporosis without current pathological fracture: Secondary | ICD-10-CM | POA: Diagnosis not present

## 2024-05-18 DIAGNOSIS — Z7982 Long term (current) use of aspirin: Secondary | ICD-10-CM | POA: Diagnosis not present

## 2024-05-18 DIAGNOSIS — F419 Anxiety disorder, unspecified: Secondary | ICD-10-CM | POA: Diagnosis not present

## 2024-05-18 DIAGNOSIS — L97322 Non-pressure chronic ulcer of left ankle with fat layer exposed: Secondary | ICD-10-CM | POA: Diagnosis not present

## 2024-05-18 DIAGNOSIS — Z8616 Personal history of COVID-19: Secondary | ICD-10-CM | POA: Diagnosis not present

## 2024-05-22 DIAGNOSIS — M79674 Pain in right toe(s): Secondary | ICD-10-CM | POA: Diagnosis not present

## 2024-05-22 DIAGNOSIS — I83009 Varicose veins of unspecified lower extremity with ulcer of unspecified site: Secondary | ICD-10-CM | POA: Diagnosis not present

## 2024-05-22 DIAGNOSIS — L851 Acquired keratosis [keratoderma] palmaris et plantaris: Secondary | ICD-10-CM | POA: Diagnosis not present

## 2024-05-22 DIAGNOSIS — I872 Venous insufficiency (chronic) (peripheral): Secondary | ICD-10-CM | POA: Diagnosis not present

## 2024-05-24 DIAGNOSIS — D500 Iron deficiency anemia secondary to blood loss (chronic): Secondary | ICD-10-CM | POA: Diagnosis not present

## 2024-05-24 DIAGNOSIS — L97322 Non-pressure chronic ulcer of left ankle with fat layer exposed: Secondary | ICD-10-CM | POA: Diagnosis not present

## 2024-05-24 DIAGNOSIS — F321 Major depressive disorder, single episode, moderate: Secondary | ICD-10-CM | POA: Diagnosis not present

## 2024-05-24 DIAGNOSIS — I700 Atherosclerosis of aorta: Secondary | ICD-10-CM | POA: Diagnosis not present

## 2024-05-24 DIAGNOSIS — I739 Peripheral vascular disease, unspecified: Secondary | ICD-10-CM | POA: Diagnosis not present

## 2024-05-24 DIAGNOSIS — I872 Venous insufficiency (chronic) (peripheral): Secondary | ICD-10-CM | POA: Diagnosis not present

## 2024-05-24 DIAGNOSIS — Z7982 Long term (current) use of aspirin: Secondary | ICD-10-CM | POA: Diagnosis not present

## 2024-05-24 DIAGNOSIS — J449 Chronic obstructive pulmonary disease, unspecified: Secondary | ICD-10-CM | POA: Diagnosis not present

## 2024-05-24 DIAGNOSIS — I890 Lymphedema, not elsewhere classified: Secondary | ICD-10-CM | POA: Diagnosis not present

## 2024-05-24 DIAGNOSIS — Z602 Problems related to living alone: Secondary | ICD-10-CM | POA: Diagnosis not present

## 2024-05-24 DIAGNOSIS — M810 Age-related osteoporosis without current pathological fracture: Secondary | ICD-10-CM | POA: Diagnosis not present

## 2024-05-24 DIAGNOSIS — Z8616 Personal history of COVID-19: Secondary | ICD-10-CM | POA: Diagnosis not present

## 2024-05-24 DIAGNOSIS — I10 Essential (primary) hypertension: Secondary | ICD-10-CM | POA: Diagnosis not present

## 2024-05-24 DIAGNOSIS — F419 Anxiety disorder, unspecified: Secondary | ICD-10-CM | POA: Diagnosis not present

## 2024-05-26 DIAGNOSIS — F321 Major depressive disorder, single episode, moderate: Secondary | ICD-10-CM | POA: Diagnosis not present

## 2024-05-26 DIAGNOSIS — I739 Peripheral vascular disease, unspecified: Secondary | ICD-10-CM | POA: Diagnosis not present

## 2024-05-26 DIAGNOSIS — M810 Age-related osteoporosis without current pathological fracture: Secondary | ICD-10-CM | POA: Diagnosis not present

## 2024-05-26 DIAGNOSIS — D500 Iron deficiency anemia secondary to blood loss (chronic): Secondary | ICD-10-CM | POA: Diagnosis not present

## 2024-05-26 DIAGNOSIS — I872 Venous insufficiency (chronic) (peripheral): Secondary | ICD-10-CM | POA: Diagnosis not present

## 2024-05-26 DIAGNOSIS — F419 Anxiety disorder, unspecified: Secondary | ICD-10-CM | POA: Diagnosis not present

## 2024-05-26 DIAGNOSIS — I700 Atherosclerosis of aorta: Secondary | ICD-10-CM | POA: Diagnosis not present

## 2024-05-26 DIAGNOSIS — I890 Lymphedema, not elsewhere classified: Secondary | ICD-10-CM | POA: Diagnosis not present

## 2024-05-26 DIAGNOSIS — L97322 Non-pressure chronic ulcer of left ankle with fat layer exposed: Secondary | ICD-10-CM | POA: Diagnosis not present

## 2024-05-26 DIAGNOSIS — Z7982 Long term (current) use of aspirin: Secondary | ICD-10-CM | POA: Diagnosis not present

## 2024-05-26 DIAGNOSIS — J449 Chronic obstructive pulmonary disease, unspecified: Secondary | ICD-10-CM | POA: Diagnosis not present

## 2024-05-26 DIAGNOSIS — I10 Essential (primary) hypertension: Secondary | ICD-10-CM | POA: Diagnosis not present

## 2024-05-26 DIAGNOSIS — Z8616 Personal history of COVID-19: Secondary | ICD-10-CM | POA: Diagnosis not present

## 2024-05-26 DIAGNOSIS — Z602 Problems related to living alone: Secondary | ICD-10-CM | POA: Diagnosis not present

## 2024-05-29 DIAGNOSIS — L851 Acquired keratosis [keratoderma] palmaris et plantaris: Secondary | ICD-10-CM | POA: Diagnosis not present

## 2024-05-29 DIAGNOSIS — I83009 Varicose veins of unspecified lower extremity with ulcer of unspecified site: Secondary | ICD-10-CM | POA: Diagnosis not present

## 2024-05-29 DIAGNOSIS — I872 Venous insufficiency (chronic) (peripheral): Secondary | ICD-10-CM | POA: Diagnosis not present

## 2024-05-31 DIAGNOSIS — M810 Age-related osteoporosis without current pathological fracture: Secondary | ICD-10-CM | POA: Diagnosis not present

## 2024-05-31 DIAGNOSIS — I739 Peripheral vascular disease, unspecified: Secondary | ICD-10-CM | POA: Diagnosis not present

## 2024-05-31 DIAGNOSIS — L97322 Non-pressure chronic ulcer of left ankle with fat layer exposed: Secondary | ICD-10-CM | POA: Diagnosis not present

## 2024-05-31 DIAGNOSIS — I872 Venous insufficiency (chronic) (peripheral): Secondary | ICD-10-CM | POA: Diagnosis not present

## 2024-05-31 DIAGNOSIS — I890 Lymphedema, not elsewhere classified: Secondary | ICD-10-CM | POA: Diagnosis not present

## 2024-05-31 DIAGNOSIS — F321 Major depressive disorder, single episode, moderate: Secondary | ICD-10-CM | POA: Diagnosis not present

## 2024-05-31 DIAGNOSIS — Z7982 Long term (current) use of aspirin: Secondary | ICD-10-CM | POA: Diagnosis not present

## 2024-05-31 DIAGNOSIS — Z602 Problems related to living alone: Secondary | ICD-10-CM | POA: Diagnosis not present

## 2024-05-31 DIAGNOSIS — J449 Chronic obstructive pulmonary disease, unspecified: Secondary | ICD-10-CM | POA: Diagnosis not present

## 2024-05-31 DIAGNOSIS — Z8616 Personal history of COVID-19: Secondary | ICD-10-CM | POA: Diagnosis not present

## 2024-05-31 DIAGNOSIS — F419 Anxiety disorder, unspecified: Secondary | ICD-10-CM | POA: Diagnosis not present

## 2024-05-31 DIAGNOSIS — I10 Essential (primary) hypertension: Secondary | ICD-10-CM | POA: Diagnosis not present

## 2024-05-31 DIAGNOSIS — D500 Iron deficiency anemia secondary to blood loss (chronic): Secondary | ICD-10-CM | POA: Diagnosis not present

## 2024-05-31 DIAGNOSIS — I700 Atherosclerosis of aorta: Secondary | ICD-10-CM | POA: Diagnosis not present

## 2024-06-02 DIAGNOSIS — F321 Major depressive disorder, single episode, moderate: Secondary | ICD-10-CM | POA: Diagnosis not present

## 2024-06-02 DIAGNOSIS — Z7982 Long term (current) use of aspirin: Secondary | ICD-10-CM | POA: Diagnosis not present

## 2024-06-02 DIAGNOSIS — Z602 Problems related to living alone: Secondary | ICD-10-CM | POA: Diagnosis not present

## 2024-06-02 DIAGNOSIS — M810 Age-related osteoporosis without current pathological fracture: Secondary | ICD-10-CM | POA: Diagnosis not present

## 2024-06-02 DIAGNOSIS — I872 Venous insufficiency (chronic) (peripheral): Secondary | ICD-10-CM | POA: Diagnosis not present

## 2024-06-02 DIAGNOSIS — I10 Essential (primary) hypertension: Secondary | ICD-10-CM | POA: Diagnosis not present

## 2024-06-02 DIAGNOSIS — F419 Anxiety disorder, unspecified: Secondary | ICD-10-CM | POA: Diagnosis not present

## 2024-06-02 DIAGNOSIS — I739 Peripheral vascular disease, unspecified: Secondary | ICD-10-CM | POA: Diagnosis not present

## 2024-06-02 DIAGNOSIS — D500 Iron deficiency anemia secondary to blood loss (chronic): Secondary | ICD-10-CM | POA: Diagnosis not present

## 2024-06-02 DIAGNOSIS — J449 Chronic obstructive pulmonary disease, unspecified: Secondary | ICD-10-CM | POA: Diagnosis not present

## 2024-06-02 DIAGNOSIS — I890 Lymphedema, not elsewhere classified: Secondary | ICD-10-CM | POA: Diagnosis not present

## 2024-06-02 DIAGNOSIS — Z8616 Personal history of COVID-19: Secondary | ICD-10-CM | POA: Diagnosis not present

## 2024-06-02 DIAGNOSIS — I700 Atherosclerosis of aorta: Secondary | ICD-10-CM | POA: Diagnosis not present

## 2024-06-02 DIAGNOSIS — L97322 Non-pressure chronic ulcer of left ankle with fat layer exposed: Secondary | ICD-10-CM | POA: Diagnosis not present

## 2024-06-05 DIAGNOSIS — I872 Venous insufficiency (chronic) (peripheral): Secondary | ICD-10-CM | POA: Diagnosis not present

## 2024-06-05 DIAGNOSIS — I83009 Varicose veins of unspecified lower extremity with ulcer of unspecified site: Secondary | ICD-10-CM | POA: Diagnosis not present

## 2024-06-07 DIAGNOSIS — Z7982 Long term (current) use of aspirin: Secondary | ICD-10-CM | POA: Diagnosis not present

## 2024-06-07 DIAGNOSIS — F321 Major depressive disorder, single episode, moderate: Secondary | ICD-10-CM | POA: Diagnosis not present

## 2024-06-07 DIAGNOSIS — M810 Age-related osteoporosis without current pathological fracture: Secondary | ICD-10-CM | POA: Diagnosis not present

## 2024-06-07 DIAGNOSIS — L97322 Non-pressure chronic ulcer of left ankle with fat layer exposed: Secondary | ICD-10-CM | POA: Diagnosis not present

## 2024-06-07 DIAGNOSIS — I739 Peripheral vascular disease, unspecified: Secondary | ICD-10-CM | POA: Diagnosis not present

## 2024-06-07 DIAGNOSIS — D500 Iron deficiency anemia secondary to blood loss (chronic): Secondary | ICD-10-CM | POA: Diagnosis not present

## 2024-06-07 DIAGNOSIS — I10 Essential (primary) hypertension: Secondary | ICD-10-CM | POA: Diagnosis not present

## 2024-06-07 DIAGNOSIS — I890 Lymphedema, not elsewhere classified: Secondary | ICD-10-CM | POA: Diagnosis not present

## 2024-06-07 DIAGNOSIS — J449 Chronic obstructive pulmonary disease, unspecified: Secondary | ICD-10-CM | POA: Diagnosis not present

## 2024-06-07 DIAGNOSIS — I700 Atherosclerosis of aorta: Secondary | ICD-10-CM | POA: Diagnosis not present

## 2024-06-07 DIAGNOSIS — Z602 Problems related to living alone: Secondary | ICD-10-CM | POA: Diagnosis not present

## 2024-06-07 DIAGNOSIS — F419 Anxiety disorder, unspecified: Secondary | ICD-10-CM | POA: Diagnosis not present

## 2024-06-07 DIAGNOSIS — I872 Venous insufficiency (chronic) (peripheral): Secondary | ICD-10-CM | POA: Diagnosis not present

## 2024-06-07 DIAGNOSIS — Z8616 Personal history of COVID-19: Secondary | ICD-10-CM | POA: Diagnosis not present

## 2024-06-09 DIAGNOSIS — L97322 Non-pressure chronic ulcer of left ankle with fat layer exposed: Secondary | ICD-10-CM | POA: Diagnosis not present

## 2024-06-09 DIAGNOSIS — I739 Peripheral vascular disease, unspecified: Secondary | ICD-10-CM | POA: Diagnosis not present

## 2024-06-09 DIAGNOSIS — I872 Venous insufficiency (chronic) (peripheral): Secondary | ICD-10-CM | POA: Diagnosis not present

## 2024-06-09 DIAGNOSIS — I10 Essential (primary) hypertension: Secondary | ICD-10-CM | POA: Diagnosis not present

## 2024-06-09 DIAGNOSIS — M810 Age-related osteoporosis without current pathological fracture: Secondary | ICD-10-CM | POA: Diagnosis not present

## 2024-06-09 DIAGNOSIS — J449 Chronic obstructive pulmonary disease, unspecified: Secondary | ICD-10-CM | POA: Diagnosis not present

## 2024-06-09 DIAGNOSIS — D500 Iron deficiency anemia secondary to blood loss (chronic): Secondary | ICD-10-CM | POA: Diagnosis not present

## 2024-06-09 DIAGNOSIS — F321 Major depressive disorder, single episode, moderate: Secondary | ICD-10-CM | POA: Diagnosis not present

## 2024-06-09 DIAGNOSIS — I700 Atherosclerosis of aorta: Secondary | ICD-10-CM | POA: Diagnosis not present

## 2024-06-09 DIAGNOSIS — Z8616 Personal history of COVID-19: Secondary | ICD-10-CM | POA: Diagnosis not present

## 2024-06-09 DIAGNOSIS — I890 Lymphedema, not elsewhere classified: Secondary | ICD-10-CM | POA: Diagnosis not present

## 2024-06-09 DIAGNOSIS — F419 Anxiety disorder, unspecified: Secondary | ICD-10-CM | POA: Diagnosis not present

## 2024-06-09 DIAGNOSIS — Z602 Problems related to living alone: Secondary | ICD-10-CM | POA: Diagnosis not present

## 2024-06-09 DIAGNOSIS — Z7982 Long term (current) use of aspirin: Secondary | ICD-10-CM | POA: Diagnosis not present

## 2024-06-12 DIAGNOSIS — I83009 Varicose veins of unspecified lower extremity with ulcer of unspecified site: Secondary | ICD-10-CM | POA: Diagnosis not present

## 2024-06-12 DIAGNOSIS — I872 Venous insufficiency (chronic) (peripheral): Secondary | ICD-10-CM | POA: Diagnosis not present

## 2024-06-14 DIAGNOSIS — I739 Peripheral vascular disease, unspecified: Secondary | ICD-10-CM | POA: Diagnosis not present

## 2024-06-14 DIAGNOSIS — L97322 Non-pressure chronic ulcer of left ankle with fat layer exposed: Secondary | ICD-10-CM | POA: Diagnosis not present

## 2024-06-14 DIAGNOSIS — Z8616 Personal history of COVID-19: Secondary | ICD-10-CM | POA: Diagnosis not present

## 2024-06-14 DIAGNOSIS — J449 Chronic obstructive pulmonary disease, unspecified: Secondary | ICD-10-CM | POA: Diagnosis not present

## 2024-06-14 DIAGNOSIS — D500 Iron deficiency anemia secondary to blood loss (chronic): Secondary | ICD-10-CM | POA: Diagnosis not present

## 2024-06-14 DIAGNOSIS — M810 Age-related osteoporosis without current pathological fracture: Secondary | ICD-10-CM | POA: Diagnosis not present

## 2024-06-14 DIAGNOSIS — I700 Atherosclerosis of aorta: Secondary | ICD-10-CM | POA: Diagnosis not present

## 2024-06-14 DIAGNOSIS — Z602 Problems related to living alone: Secondary | ICD-10-CM | POA: Diagnosis not present

## 2024-06-14 DIAGNOSIS — F331 Major depressive disorder, recurrent, moderate: Secondary | ICD-10-CM | POA: Diagnosis not present

## 2024-06-14 DIAGNOSIS — K580 Irritable bowel syndrome with diarrhea: Secondary | ICD-10-CM | POA: Diagnosis not present

## 2024-06-14 DIAGNOSIS — I872 Venous insufficiency (chronic) (peripheral): Secondary | ICD-10-CM | POA: Diagnosis not present

## 2024-06-14 DIAGNOSIS — I890 Lymphedema, not elsewhere classified: Secondary | ICD-10-CM | POA: Diagnosis not present

## 2024-06-14 DIAGNOSIS — Z7982 Long term (current) use of aspirin: Secondary | ICD-10-CM | POA: Diagnosis not present

## 2024-06-14 DIAGNOSIS — F419 Anxiety disorder, unspecified: Secondary | ICD-10-CM | POA: Diagnosis not present

## 2024-06-14 DIAGNOSIS — I10 Essential (primary) hypertension: Secondary | ICD-10-CM | POA: Diagnosis not present

## 2024-06-14 DIAGNOSIS — F321 Major depressive disorder, single episode, moderate: Secondary | ICD-10-CM | POA: Diagnosis not present

## 2024-06-15 DIAGNOSIS — F331 Major depressive disorder, recurrent, moderate: Secondary | ICD-10-CM | POA: Diagnosis not present

## 2024-06-15 DIAGNOSIS — Z7189 Other specified counseling: Secondary | ICD-10-CM | POA: Diagnosis not present

## 2024-06-15 DIAGNOSIS — I10 Essential (primary) hypertension: Secondary | ICD-10-CM | POA: Diagnosis not present

## 2024-06-15 DIAGNOSIS — I739 Peripheral vascular disease, unspecified: Secondary | ICD-10-CM | POA: Diagnosis not present

## 2024-06-15 DIAGNOSIS — K2100 Gastro-esophageal reflux disease with esophagitis, without bleeding: Secondary | ICD-10-CM | POA: Diagnosis not present

## 2024-06-15 DIAGNOSIS — M810 Age-related osteoporosis without current pathological fracture: Secondary | ICD-10-CM | POA: Diagnosis not present

## 2024-06-15 DIAGNOSIS — K580 Irritable bowel syndrome with diarrhea: Secondary | ICD-10-CM | POA: Diagnosis not present

## 2024-06-15 DIAGNOSIS — E7800 Pure hypercholesterolemia, unspecified: Secondary | ICD-10-CM | POA: Diagnosis not present

## 2024-06-15 DIAGNOSIS — R7303 Prediabetes: Secondary | ICD-10-CM | POA: Diagnosis not present

## 2024-06-16 DIAGNOSIS — F419 Anxiety disorder, unspecified: Secondary | ICD-10-CM | POA: Diagnosis not present

## 2024-06-16 DIAGNOSIS — Z602 Problems related to living alone: Secondary | ICD-10-CM | POA: Diagnosis not present

## 2024-06-16 DIAGNOSIS — I872 Venous insufficiency (chronic) (peripheral): Secondary | ICD-10-CM | POA: Diagnosis not present

## 2024-06-16 DIAGNOSIS — I739 Peripheral vascular disease, unspecified: Secondary | ICD-10-CM | POA: Diagnosis not present

## 2024-06-16 DIAGNOSIS — L97322 Non-pressure chronic ulcer of left ankle with fat layer exposed: Secondary | ICD-10-CM | POA: Diagnosis not present

## 2024-06-16 DIAGNOSIS — J449 Chronic obstructive pulmonary disease, unspecified: Secondary | ICD-10-CM | POA: Diagnosis not present

## 2024-06-16 DIAGNOSIS — I700 Atherosclerosis of aorta: Secondary | ICD-10-CM | POA: Diagnosis not present

## 2024-06-16 DIAGNOSIS — Z7982 Long term (current) use of aspirin: Secondary | ICD-10-CM | POA: Diagnosis not present

## 2024-06-16 DIAGNOSIS — Z8616 Personal history of COVID-19: Secondary | ICD-10-CM | POA: Diagnosis not present

## 2024-06-16 DIAGNOSIS — F321 Major depressive disorder, single episode, moderate: Secondary | ICD-10-CM | POA: Diagnosis not present

## 2024-06-16 DIAGNOSIS — I890 Lymphedema, not elsewhere classified: Secondary | ICD-10-CM | POA: Diagnosis not present

## 2024-06-16 DIAGNOSIS — M810 Age-related osteoporosis without current pathological fracture: Secondary | ICD-10-CM | POA: Diagnosis not present

## 2024-06-16 DIAGNOSIS — I10 Essential (primary) hypertension: Secondary | ICD-10-CM | POA: Diagnosis not present

## 2024-06-16 DIAGNOSIS — D500 Iron deficiency anemia secondary to blood loss (chronic): Secondary | ICD-10-CM | POA: Diagnosis not present

## 2024-06-19 DIAGNOSIS — L851 Acquired keratosis [keratoderma] palmaris et plantaris: Secondary | ICD-10-CM | POA: Diagnosis not present

## 2024-06-19 DIAGNOSIS — M79674 Pain in right toe(s): Secondary | ICD-10-CM | POA: Diagnosis not present

## 2024-06-19 DIAGNOSIS — I83009 Varicose veins of unspecified lower extremity with ulcer of unspecified site: Secondary | ICD-10-CM | POA: Diagnosis not present

## 2024-06-19 DIAGNOSIS — I872 Venous insufficiency (chronic) (peripheral): Secondary | ICD-10-CM | POA: Diagnosis not present

## 2024-06-21 DIAGNOSIS — Z7982 Long term (current) use of aspirin: Secondary | ICD-10-CM | POA: Diagnosis not present

## 2024-06-21 DIAGNOSIS — J449 Chronic obstructive pulmonary disease, unspecified: Secondary | ICD-10-CM | POA: Diagnosis not present

## 2024-06-21 DIAGNOSIS — I739 Peripheral vascular disease, unspecified: Secondary | ICD-10-CM | POA: Diagnosis not present

## 2024-06-21 DIAGNOSIS — L97322 Non-pressure chronic ulcer of left ankle with fat layer exposed: Secondary | ICD-10-CM | POA: Diagnosis not present

## 2024-06-21 DIAGNOSIS — I872 Venous insufficiency (chronic) (peripheral): Secondary | ICD-10-CM | POA: Diagnosis not present

## 2024-06-21 DIAGNOSIS — Z602 Problems related to living alone: Secondary | ICD-10-CM | POA: Diagnosis not present

## 2024-06-21 DIAGNOSIS — F419 Anxiety disorder, unspecified: Secondary | ICD-10-CM | POA: Diagnosis not present

## 2024-06-21 DIAGNOSIS — M810 Age-related osteoporosis without current pathological fracture: Secondary | ICD-10-CM | POA: Diagnosis not present

## 2024-06-21 DIAGNOSIS — I700 Atherosclerosis of aorta: Secondary | ICD-10-CM | POA: Diagnosis not present

## 2024-06-21 DIAGNOSIS — I890 Lymphedema, not elsewhere classified: Secondary | ICD-10-CM | POA: Diagnosis not present

## 2024-06-21 DIAGNOSIS — D500 Iron deficiency anemia secondary to blood loss (chronic): Secondary | ICD-10-CM | POA: Diagnosis not present

## 2024-06-21 DIAGNOSIS — I10 Essential (primary) hypertension: Secondary | ICD-10-CM | POA: Diagnosis not present

## 2024-06-21 DIAGNOSIS — Z8616 Personal history of COVID-19: Secondary | ICD-10-CM | POA: Diagnosis not present

## 2024-06-21 DIAGNOSIS — F321 Major depressive disorder, single episode, moderate: Secondary | ICD-10-CM | POA: Diagnosis not present

## 2024-06-23 DIAGNOSIS — I10 Essential (primary) hypertension: Secondary | ICD-10-CM | POA: Diagnosis not present

## 2024-06-23 DIAGNOSIS — I872 Venous insufficiency (chronic) (peripheral): Secondary | ICD-10-CM | POA: Diagnosis not present

## 2024-06-23 DIAGNOSIS — D500 Iron deficiency anemia secondary to blood loss (chronic): Secondary | ICD-10-CM | POA: Diagnosis not present

## 2024-06-23 DIAGNOSIS — I890 Lymphedema, not elsewhere classified: Secondary | ICD-10-CM | POA: Diagnosis not present

## 2024-06-23 DIAGNOSIS — I700 Atherosclerosis of aorta: Secondary | ICD-10-CM | POA: Diagnosis not present

## 2024-06-23 DIAGNOSIS — J449 Chronic obstructive pulmonary disease, unspecified: Secondary | ICD-10-CM | POA: Diagnosis not present

## 2024-06-23 DIAGNOSIS — F419 Anxiety disorder, unspecified: Secondary | ICD-10-CM | POA: Diagnosis not present

## 2024-06-23 DIAGNOSIS — M810 Age-related osteoporosis without current pathological fracture: Secondary | ICD-10-CM | POA: Diagnosis not present

## 2024-06-23 DIAGNOSIS — Z8616 Personal history of COVID-19: Secondary | ICD-10-CM | POA: Diagnosis not present

## 2024-06-23 DIAGNOSIS — Z7982 Long term (current) use of aspirin: Secondary | ICD-10-CM | POA: Diagnosis not present

## 2024-06-23 DIAGNOSIS — F321 Major depressive disorder, single episode, moderate: Secondary | ICD-10-CM | POA: Diagnosis not present

## 2024-06-23 DIAGNOSIS — I739 Peripheral vascular disease, unspecified: Secondary | ICD-10-CM | POA: Diagnosis not present

## 2024-06-23 DIAGNOSIS — L97322 Non-pressure chronic ulcer of left ankle with fat layer exposed: Secondary | ICD-10-CM | POA: Diagnosis not present

## 2024-06-23 DIAGNOSIS — Z602 Problems related to living alone: Secondary | ICD-10-CM | POA: Diagnosis not present

## 2024-06-26 DIAGNOSIS — I83009 Varicose veins of unspecified lower extremity with ulcer of unspecified site: Secondary | ICD-10-CM | POA: Diagnosis not present

## 2024-06-26 DIAGNOSIS — I872 Venous insufficiency (chronic) (peripheral): Secondary | ICD-10-CM | POA: Diagnosis not present

## 2024-06-28 DIAGNOSIS — Z7982 Long term (current) use of aspirin: Secondary | ICD-10-CM | POA: Diagnosis not present

## 2024-06-28 DIAGNOSIS — Z602 Problems related to living alone: Secondary | ICD-10-CM | POA: Diagnosis not present

## 2024-06-28 DIAGNOSIS — J449 Chronic obstructive pulmonary disease, unspecified: Secondary | ICD-10-CM | POA: Diagnosis not present

## 2024-06-28 DIAGNOSIS — I739 Peripheral vascular disease, unspecified: Secondary | ICD-10-CM | POA: Diagnosis not present

## 2024-06-28 DIAGNOSIS — D500 Iron deficiency anemia secondary to blood loss (chronic): Secondary | ICD-10-CM | POA: Diagnosis not present

## 2024-06-28 DIAGNOSIS — M810 Age-related osteoporosis without current pathological fracture: Secondary | ICD-10-CM | POA: Diagnosis not present

## 2024-06-28 DIAGNOSIS — L97322 Non-pressure chronic ulcer of left ankle with fat layer exposed: Secondary | ICD-10-CM | POA: Diagnosis not present

## 2024-06-28 DIAGNOSIS — Z8616 Personal history of COVID-19: Secondary | ICD-10-CM | POA: Diagnosis not present

## 2024-06-28 DIAGNOSIS — I872 Venous insufficiency (chronic) (peripheral): Secondary | ICD-10-CM | POA: Diagnosis not present

## 2024-06-28 DIAGNOSIS — I10 Essential (primary) hypertension: Secondary | ICD-10-CM | POA: Diagnosis not present

## 2024-06-28 DIAGNOSIS — I890 Lymphedema, not elsewhere classified: Secondary | ICD-10-CM | POA: Diagnosis not present

## 2024-06-28 DIAGNOSIS — I700 Atherosclerosis of aorta: Secondary | ICD-10-CM | POA: Diagnosis not present

## 2024-06-28 DIAGNOSIS — F321 Major depressive disorder, single episode, moderate: Secondary | ICD-10-CM | POA: Diagnosis not present

## 2024-06-28 DIAGNOSIS — F419 Anxiety disorder, unspecified: Secondary | ICD-10-CM | POA: Diagnosis not present

## 2024-07-03 DIAGNOSIS — I83009 Varicose veins of unspecified lower extremity with ulcer of unspecified site: Secondary | ICD-10-CM | POA: Diagnosis not present

## 2024-07-03 DIAGNOSIS — I872 Venous insufficiency (chronic) (peripheral): Secondary | ICD-10-CM | POA: Diagnosis not present

## 2024-07-07 ENCOUNTER — Other Ambulatory Visit: Payer: Self-pay

## 2024-07-07 DIAGNOSIS — I872 Venous insufficiency (chronic) (peripheral): Secondary | ICD-10-CM

## 2024-07-10 DIAGNOSIS — I872 Venous insufficiency (chronic) (peripheral): Secondary | ICD-10-CM | POA: Diagnosis not present

## 2024-07-10 DIAGNOSIS — I83009 Varicose veins of unspecified lower extremity with ulcer of unspecified site: Secondary | ICD-10-CM | POA: Diagnosis not present

## 2024-07-14 DIAGNOSIS — L97329 Non-pressure chronic ulcer of left ankle with unspecified severity: Secondary | ICD-10-CM | POA: Diagnosis not present

## 2024-07-14 DIAGNOSIS — I87312 Chronic venous hypertension (idiopathic) with ulcer of left lower extremity: Secondary | ICD-10-CM | POA: Diagnosis not present

## 2024-07-24 DIAGNOSIS — M79674 Pain in right toe(s): Secondary | ICD-10-CM | POA: Diagnosis not present

## 2024-07-24 DIAGNOSIS — L851 Acquired keratosis [keratoderma] palmaris et plantaris: Secondary | ICD-10-CM | POA: Diagnosis not present

## 2024-07-24 DIAGNOSIS — I872 Venous insufficiency (chronic) (peripheral): Secondary | ICD-10-CM | POA: Diagnosis not present

## 2024-07-24 DIAGNOSIS — I83009 Varicose veins of unspecified lower extremity with ulcer of unspecified site: Secondary | ICD-10-CM | POA: Diagnosis not present

## 2024-07-28 DIAGNOSIS — Z23 Encounter for immunization: Secondary | ICD-10-CM | POA: Diagnosis not present

## 2024-07-28 DIAGNOSIS — M810 Age-related osteoporosis without current pathological fracture: Secondary | ICD-10-CM | POA: Diagnosis not present

## 2024-07-28 DIAGNOSIS — Z6822 Body mass index (BMI) 22.0-22.9, adult: Secondary | ICD-10-CM | POA: Diagnosis not present

## 2024-07-28 DIAGNOSIS — I739 Peripheral vascular disease, unspecified: Secondary | ICD-10-CM | POA: Diagnosis not present

## 2024-07-28 DIAGNOSIS — K2100 Gastro-esophageal reflux disease with esophagitis, without bleeding: Secondary | ICD-10-CM | POA: Diagnosis not present

## 2024-07-28 DIAGNOSIS — I700 Atherosclerosis of aorta: Secondary | ICD-10-CM | POA: Diagnosis not present

## 2024-07-31 DIAGNOSIS — I83009 Varicose veins of unspecified lower extremity with ulcer of unspecified site: Secondary | ICD-10-CM | POA: Diagnosis not present

## 2024-07-31 DIAGNOSIS — L851 Acquired keratosis [keratoderma] palmaris et plantaris: Secondary | ICD-10-CM | POA: Diagnosis not present

## 2024-07-31 DIAGNOSIS — M79675 Pain in left toe(s): Secondary | ICD-10-CM | POA: Diagnosis not present

## 2024-07-31 DIAGNOSIS — I872 Venous insufficiency (chronic) (peripheral): Secondary | ICD-10-CM | POA: Diagnosis not present

## 2024-08-03 ENCOUNTER — Ambulatory Visit (INDEPENDENT_AMBULATORY_CARE_PROVIDER_SITE_OTHER): Admitting: Physician Assistant

## 2024-08-03 ENCOUNTER — Ambulatory Visit (HOSPITAL_COMMUNITY)
Admission: RE | Admit: 2024-08-03 | Discharge: 2024-08-03 | Disposition: A | Discharge status: Home / Self Care | Source: Ambulatory Visit | Attending: Vascular Surgery | Admitting: Vascular Surgery

## 2024-08-03 VITALS — BP 95/62 | HR 58 | Temp 97.7°F | Wt 121.7 lb

## 2024-08-03 DIAGNOSIS — I872 Venous insufficiency (chronic) (peripheral): Secondary | ICD-10-CM | POA: Diagnosis not present

## 2024-08-03 DIAGNOSIS — R600 Localized edema: Secondary | ICD-10-CM

## 2024-08-03 NOTE — Progress Notes (Signed)
 [facility]           [facility] [place]  History and Physical   Stacy Case is a 73 y.o. female who presents with leg swelling.  She was seen on 06/13/2021 for chronic leg swelling and ulcerations.  She was following with wound care specialist in [place].  She was wearing serial unna boots. She had stated that an amputation was recommended at one time and she went home and cared for her wound as she did not want amputation.   She comes in today bc she has a non healing wound on the left medial ankle.  She states it has not been able to heal this over the past 3 years.  She states that Dr. Tobie wanted her evaluated.  She denies any rest pain in her feet and she does not endorse any claudication in either leg.  She quit smoking but now vapes.  She does have family hx of varicose veins. She has never had a DVT.  She does have a compression stocking.  She does have skin color changes.    The pt is not on a statin for cholesterol management.  The pt is not on a daily aspirin .   Other AC:  none The pt is on BB, CCB, diuretic for hypertension.   The pt is not on medication for diabetes.   Tobacco hx:  former  Pt does not have family hx of AAA.  Past Medical History:  Diagnosis Date   Anxiety    Arthritis    Cellulitis of ankle    COPD (chronic obstructive pulmonary disease) (HCC)    Cystocele    Depression    GERD (gastroesophageal reflux disease)    Headache    hx of migraines   Hyperlipidemia    Hypertension    IBS (irritable bowel syndrome)    Peripheral arterial disease (HCC)    legs   Pneumonia    Right inguinal hernia     Past Surgical History:  Procedure Laterality Date   ABDOMINAL AORTAGRAM N/A 11/20/2013   Procedure: ABDOMINAL EZELLA;  Surgeon: Lonni GORMAN Blade, MD;  Location: [facility] CATH LAB;  Service: Cardiovascular;  Laterality: N/A;   APPENDECTOMY     JOINT REPLACEMENT     Left hip 2018   TOTAL HIP ARTHROPLASTY Right 02/28/2020    Procedure: TOTAL HIP ARTHROPLASTY ANTERIOR APPROACH;  Surgeon: Melodi Lerner, MD;  Location: [facility] ORS;  Service: Orthopedics;  Laterality: Right;    WRIST SURGERY      Social History   Socioeconomic History   Marital status: Single    Spouse name: Not on file   Number of children: 3   Years of education: Not on file   Highest education level: Not on file  Occupational History   Not on file  Tobacco Use   Smoking status: Former    Current packs/day: 0.00    Average packs/day: 1 pack/day for 40.0 years (40.0 ttl pk-yrs)    Types: Cigarettes    Start date: 02/20/1977    Quit date: 02/20/2017    Years since quitting: 7.4   Smokeless tobacco: Never  Vaping Use   Vaping status: Every Day   Substances: Nicotine, Flavoring  Substance and Sexual Activity   Alcohol  use: No   Drug use: No   Sexual activity: Not Currently  Other Topics Concern   Not on file  Social History Narrative   Not on file  Social Drivers of Corporate investment banker Strain: Low Risk  (10/17/2020)   Received from [facility]   Overall Financial Resource Strain (CARDIA)    Difficulty of Paying Living Expenses: Not hard at all  Food Insecurity: No Food Insecurity (10/17/2020)   Received from [facility]   Hunger Vital Sign    Within the past 12 months, you worried that your food would run out before you got the money to buy more.: Never true    Within the past 12 months, the food you bought just didn't last and you didn't have money to get more.: Never true  Transportation Needs: No Transportation Needs (10/17/2020)   Received from [facility] - Transportation    Lack of Transportation (Medical): No    Lack of Transportation (Non-Medical): No  Physical Activity: Not on file  Stress: Not on file  Social Connections: Not on file  Intimate Partner Violence: Not on file     Family History  Problem Relation Age of Onset   Varicose Veins Mother    Clotting disorder Mother     Diabetes Brother    Hypertension Brother    Diabetes Daughter    Hyperlipidemia Daughter    Hypertension Daughter     Current Outpatient Medications  Medication Sig Dispense Refill   alendronate (FOSAMAX) 70 MG tablet Take 70 mg by mouth every 7 (seven) days.     ascorbic acid (VITAMIN C) 500 MG tablet Take 500 mg by mouth daily.     atenolol  (TENORMIN ) 50 MG tablet Take 50 mg by mouth daily.     atorvastatin  (LIPITOR) 40 MG tablet Take 40 mg by mouth daily.     Cholecalciferol (VITAMIN D3) 10 MCG (400 UNIT) CAPS Take 400 Units by mouth daily.     dicyclomine  (BENTYL ) 20 MG tablet Take 20 mg by mouth 4 (four) times daily - after meals and at bedtime.     diltiazem  (TIAZAC ) 240 MG 24 hr capsule Take 240 mg by mouth daily.     furosemide  (LASIX ) 20 MG tablet Take 20 mg by mouth daily.     HYDROcodone -acetaminophen  (NORCO/VICODIN) 5-325 MG tablet Take 1-2 tablets by mouth every 6 (six) hours as needed for moderate pain (pain score 4-6). 56 tablet 0   iron  polysaccharides (NIFEREX) 150 MG capsule Take 150 mg by mouth 2 (two) times daily.     loratadine  (CLARITIN ) 10 MG tablet Take 10 mg by mouth daily.     methocarbamol  (ROBAXIN ) 500 MG tablet Take 1 tablet (500 mg total) by mouth every 6 (six) hours as needed for muscle spasms. 40 tablet 0   mirtazapine  (REMERON ) 30 MG tablet Take 30 mg by mouth at bedtime.     Multiple Vitamin (MULTIVITAMIN WITH MINERALS) TABS tablet Take 1 tablet by mouth daily.     ondansetron  (ZOFRAN ) 8 MG tablet Take 8 mg by mouth every 8 (eight) hours as needed for nausea or vomiting.     pantoprazole  (PROTONIX ) 40 MG tablet Take 40 mg by mouth daily.     silver sulfADIAZINE (SILVADENE) 1 % cream Apply 1 application topically daily as needed (irritation).     traMADol  (ULTRAM ) 50 MG tablet Take 100 mg by mouth every 6 (six) hours as needed for moderate pain.     No current facility-administered medications for this visit.    Allergies  Allergen Reactions    Penicillamine Rash and Swelling   Clindamycin Other (See Comments)  Caused c. diff   Penicillins Rash    REVIEW OF SYSTEMS:   [X]  denotes positive finding, [ ]  denotes negative finding Cardiac  Comments:  Chest pain or chest pressure:    Shortness of breath upon exertion:    Short of breath when lying flat:    Irregular heart rhythm:        Vascular    Pain in calf, thigh, or hip brought on by ambulation:    Pain in feet at night that wakes you up from your sleep:     Blood clot in your veins:    Leg swelling:  x       Pulmonary    Oxygen  at home:    Productive cough:     Wheezing:         Neurologic    Sudden weakness in arms or legs:     Sudden numbness in arms or legs:     Sudden onset of difficulty speaking or slurred speech:    Temporary loss of vision in one eye:     Problems with dizziness:         Gastrointestinal    Blood in stool:     Vomited blood:         Genitourinary    Burning when urinating:     Blood in urine:        Psychiatric    Major depression:         Hematologic    Bleeding problems:    Problems with blood clotting too easily:        Skin    Rashes or ulcers: x       Constitutional    Fever or chills:      PHYSICAL EXAMINATION:  Today's Vitals   08/03/24 1301  BP: 95/62  Pulse: (!) 58  Temp: 97.7 F (36.5 C)  TempSrc: Temporal  Weight: 121 lb 11.2 oz (55.2 kg)  PainSc: 0-No pain   Body mass index is 22.26 kg/m.   General:  WDWN in NAD; vital signs documented above Gait: Not observed HENT: WNL, normocephalic Pulmonary: normal non-labored breathing without wheezing Cardiac: regular HR; with carotid bruits bilaterally Abdomen: soft, NT, aortic pulse is not palpable Skin: without rashes Vascular Exam/Pulses:  Right Left  Radial 2+ (normal) 1+ (weak)  DP biphasic monophasic  PT biphasic Monophasic    Extremities:    Neurologic: A&O X 3;  moving all extremities equally Psychiatric:  The pt has Normal  affect.   Non-Invasive Vascular Imaging:   Venous duplex on 08/03/2024: +--------------+---------+------+-----------+------------+--------+  LEFT         Reflux NoRefluxReflux TimeDiameter cmsComments                          Yes                                   +--------------+---------+------+-----------+------------+--------+  CFV          no                                              +--------------+---------+------+-----------+------------+--------+  FV mid        no                                              +--------------+---------+------+-----------+------------+--------+  Popliteal    no                                              +--------------+---------+------+-----------+------------+--------+  GSV at SFJ              yes    >500 ms      .50               +--------------+---------+------+-----------+------------+--------+  GSV prox thighno                            .30               +--------------+---------+------+-----------+------------+--------+  GSV mid thigh           yes    >500 ms      .18               +--------------+---------+------+-----------+------------+--------+  GSV dist thighno                            .14               +--------------+---------+------+-----------+------------+--------+  GSV at knee   no                            .24               +--------------+---------+------+-----------+------------+--------+  GSV prox calf no                            .19               +--------------+---------+------+-----------+------------+--------+  GSV mid calf            yes    >500 ms      .21               +--------------+---------+------+-----------+------------+--------+  SSV at [facility]    no                            .23               +--------------+---------+------+-----------+------------+--------+  SSV prox calf no                            .21                +--------------+---------+------+-----------+------------+--------+   Summary:  Left:  - No evidence of deep vein thrombosis seen in the left lower extremity, from the common femoral through the popliteal veins.  - No evidence of superficial venous thrombosis in the left lower extremity.  - Venous reflux is noted in the left sapheno-femoral junction.  - Venous reflux is noted in the left greater saphenous vein in the thigh.  - Venous reflux is noted in the left greater saphenous vein in the calf.     Rachael Ferrie is a 73 y.o. female who presents with: leg swelling    -pt has palpable DP pedal pulse on the right but she is not palpable on the left foot.  She does monophasic doppler flow left foot.  -in the left  lower extremity, the pt does not have evidence of DVT.  Pt does have venous reflux in the GSV at the SFJ, mid thigh and calf.  -discussed with pt about continuing to wear compression -discussed the importance of leg elevation and how to elevate properly - pt is advised to elevate their legs and a diagram is given to them to demonstrate for pt to lay flat on their back with knees elevated and slightly bent with their feet higher than their knees, which puts their feet higher than their heart for 15 minutes per day.  If pt cannot lay flat, advised to lay as flat as possible.  -pt is advised to continue as much walking as possible and avoid sitting or standing for long periods of time.  -discussed importance of maintaining a healthy weight and that water  aerobics would also be beneficial but not until her wound has healed.  -handout with recommendations given  Given she cannot heal this wound, will bring her back and get ABI.  At that time will also get carotid duplex as she does have bilateral carotid bruits.  Will have her see an MD at that visit.  Until then, continue current wound care.  Discussed the importance of smoking cessation and that it puts her at risk of limb  loss, heart attack, stroke, cancer and pulmonary issues.   Of note, pt had arteriogram by Dr. Eliza in 2015 of the RLE.      Lucie Apt, [facility] [phone]  Clinic MD:  [place]

## 2024-08-04 ENCOUNTER — Other Ambulatory Visit: Payer: Self-pay

## 2024-08-04 DIAGNOSIS — R0989 Other specified symptoms and signs involving the circulatory and respiratory systems: Secondary | ICD-10-CM

## 2024-08-04 DIAGNOSIS — I872 Venous insufficiency (chronic) (peripheral): Secondary | ICD-10-CM

## 2024-08-14 DIAGNOSIS — I872 Venous insufficiency (chronic) (peripheral): Secondary | ICD-10-CM | POA: Diagnosis not present

## 2024-08-14 DIAGNOSIS — I83009 Varicose veins of unspecified lower extremity with ulcer of unspecified site: Secondary | ICD-10-CM | POA: Diagnosis not present

## 2024-08-21 DIAGNOSIS — I872 Venous insufficiency (chronic) (peripheral): Secondary | ICD-10-CM | POA: Diagnosis not present

## 2024-08-21 DIAGNOSIS — M79674 Pain in right toe(s): Secondary | ICD-10-CM | POA: Diagnosis not present

## 2024-08-21 DIAGNOSIS — I83009 Varicose veins of unspecified lower extremity with ulcer of unspecified site: Secondary | ICD-10-CM | POA: Diagnosis not present

## 2024-08-21 DIAGNOSIS — L851 Acquired keratosis [keratoderma] palmaris et plantaris: Secondary | ICD-10-CM | POA: Diagnosis not present

## 2024-08-22 DIAGNOSIS — L03115 Cellulitis of right lower limb: Secondary | ICD-10-CM | POA: Diagnosis not present

## 2024-08-22 DIAGNOSIS — Z6821 Body mass index (BMI) 21.0-21.9, adult: Secondary | ICD-10-CM | POA: Diagnosis not present

## 2024-08-30 ENCOUNTER — Ambulatory Visit: Admitting: Vascular Surgery

## 2024-08-30 ENCOUNTER — Encounter (HOSPITAL_COMMUNITY)

## 2024-08-31 DIAGNOSIS — L97321 Non-pressure chronic ulcer of left ankle limited to breakdown of skin: Secondary | ICD-10-CM | POA: Diagnosis not present

## 2024-09-04 ENCOUNTER — Inpatient Hospital Stay (HOSPITAL_COMMUNITY)
Admission: EM | Admit: 2024-09-04 | Discharge: 2024-09-07 | DRG: 504 | Disposition: A | Discharge status: Home Health | Attending: Internal Medicine | Admitting: Internal Medicine

## 2024-09-04 ENCOUNTER — Emergency Department (HOSPITAL_COMMUNITY)

## 2024-09-04 ENCOUNTER — Inpatient Hospital Stay (HOSPITAL_COMMUNITY)

## 2024-09-04 ENCOUNTER — Other Ambulatory Visit: Payer: Self-pay

## 2024-09-04 ENCOUNTER — Encounter (HOSPITAL_COMMUNITY): Payer: Self-pay

## 2024-09-04 DIAGNOSIS — F1120 Opioid dependence, uncomplicated: Secondary | ICD-10-CM | POA: Diagnosis present

## 2024-09-04 DIAGNOSIS — Z881 Allergy status to other antibiotic agents status: Secondary | ICD-10-CM

## 2024-09-04 DIAGNOSIS — M86179 Other acute osteomyelitis, unspecified ankle and foot: Secondary | ICD-10-CM | POA: Diagnosis not present

## 2024-09-04 DIAGNOSIS — E782 Mixed hyperlipidemia: Secondary | ICD-10-CM | POA: Diagnosis present

## 2024-09-04 DIAGNOSIS — E876 Hypokalemia: Secondary | ICD-10-CM | POA: Diagnosis not present

## 2024-09-04 DIAGNOSIS — I83018 Varicose veins of right lower extremity with ulcer other part of lower leg: Secondary | ICD-10-CM | POA: Diagnosis present

## 2024-09-04 DIAGNOSIS — F17290 Nicotine dependence, other tobacco product, uncomplicated: Secondary | ICD-10-CM | POA: Diagnosis present

## 2024-09-04 DIAGNOSIS — L089 Local infection of the skin and subcutaneous tissue, unspecified: Secondary | ICD-10-CM | POA: Diagnosis not present

## 2024-09-04 DIAGNOSIS — I7092 Chronic total occlusion of artery of the extremities: Secondary | ICD-10-CM | POA: Diagnosis not present

## 2024-09-04 DIAGNOSIS — L97511 Non-pressure chronic ulcer of other part of right foot limited to breakdown of skin: Secondary | ICD-10-CM | POA: Diagnosis not present

## 2024-09-04 DIAGNOSIS — Z87891 Personal history of nicotine dependence: Secondary | ICD-10-CM | POA: Diagnosis not present

## 2024-09-04 DIAGNOSIS — I70203 Unspecified atherosclerosis of native arteries of extremities, bilateral legs: Secondary | ICD-10-CM | POA: Diagnosis not present

## 2024-09-04 DIAGNOSIS — I890 Lymphedema, not elsewhere classified: Secondary | ICD-10-CM | POA: Diagnosis not present

## 2024-09-04 DIAGNOSIS — Z8249 Family history of ischemic heart disease and other diseases of the circulatory system: Secondary | ICD-10-CM

## 2024-09-04 DIAGNOSIS — K7689 Other specified diseases of liver: Secondary | ICD-10-CM | POA: Diagnosis not present

## 2024-09-04 DIAGNOSIS — I872 Venous insufficiency (chronic) (peripheral): Secondary | ICD-10-CM | POA: Diagnosis present

## 2024-09-04 DIAGNOSIS — M86171 Other acute osteomyelitis, right ankle and foot: Secondary | ICD-10-CM | POA: Diagnosis not present

## 2024-09-04 DIAGNOSIS — Z7983 Long term (current) use of bisphosphonates: Secondary | ICD-10-CM

## 2024-09-04 DIAGNOSIS — N1831 Chronic kidney disease, stage 3a: Secondary | ICD-10-CM | POA: Diagnosis not present

## 2024-09-04 DIAGNOSIS — Z833 Family history of diabetes mellitus: Secondary | ICD-10-CM | POA: Diagnosis not present

## 2024-09-04 DIAGNOSIS — K589 Irritable bowel syndrome without diarrhea: Secondary | ICD-10-CM | POA: Diagnosis present

## 2024-09-04 DIAGNOSIS — I129 Hypertensive chronic kidney disease with stage 1 through stage 4 chronic kidney disease, or unspecified chronic kidney disease: Secondary | ICD-10-CM | POA: Diagnosis not present

## 2024-09-04 DIAGNOSIS — K219 Gastro-esophageal reflux disease without esophagitis: Secondary | ICD-10-CM | POA: Diagnosis not present

## 2024-09-04 DIAGNOSIS — L03115 Cellulitis of right lower limb: Secondary | ICD-10-CM | POA: Diagnosis present

## 2024-09-04 DIAGNOSIS — I739 Peripheral vascular disease, unspecified: Secondary | ICD-10-CM | POA: Diagnosis present

## 2024-09-04 DIAGNOSIS — E872 Acidosis, unspecified: Secondary | ICD-10-CM | POA: Diagnosis present

## 2024-09-04 DIAGNOSIS — I701 Atherosclerosis of renal artery: Secondary | ICD-10-CM | POA: Diagnosis not present

## 2024-09-04 DIAGNOSIS — J449 Chronic obstructive pulmonary disease, unspecified: Secondary | ICD-10-CM | POA: Diagnosis not present

## 2024-09-04 DIAGNOSIS — S91101A Unspecified open wound of right great toe without damage to nail, initial encounter: Secondary | ICD-10-CM | POA: Diagnosis not present

## 2024-09-04 DIAGNOSIS — Z96641 Presence of right artificial hip joint: Secondary | ICD-10-CM | POA: Diagnosis present

## 2024-09-04 DIAGNOSIS — I10 Essential (primary) hypertension: Secondary | ICD-10-CM | POA: Diagnosis not present

## 2024-09-04 DIAGNOSIS — M869 Osteomyelitis, unspecified: Principal | ICD-10-CM

## 2024-09-04 DIAGNOSIS — Z79899 Other long term (current) drug therapy: Secondary | ICD-10-CM

## 2024-09-04 DIAGNOSIS — G894 Chronic pain syndrome: Secondary | ICD-10-CM | POA: Diagnosis present

## 2024-09-04 DIAGNOSIS — Z832 Family history of diseases of the blood and blood-forming organs and certain disorders involving the immune mechanism: Secondary | ICD-10-CM

## 2024-09-04 DIAGNOSIS — M868X7 Other osteomyelitis, ankle and foot: Secondary | ICD-10-CM | POA: Diagnosis not present

## 2024-09-04 DIAGNOSIS — Z880 Allergy status to penicillin: Secondary | ICD-10-CM

## 2024-09-04 DIAGNOSIS — E785 Hyperlipidemia, unspecified: Secondary | ICD-10-CM | POA: Diagnosis not present

## 2024-09-04 DIAGNOSIS — I83028 Varicose veins of left lower extremity with ulcer other part of lower leg: Secondary | ICD-10-CM | POA: Diagnosis present

## 2024-09-04 DIAGNOSIS — L97519 Non-pressure chronic ulcer of other part of right foot with unspecified severity: Secondary | ICD-10-CM | POA: Diagnosis not present

## 2024-09-04 DIAGNOSIS — E1169 Type 2 diabetes mellitus with other specified complication: Secondary | ICD-10-CM | POA: Diagnosis not present

## 2024-09-04 LAB — CBC WITH DIFFERENTIAL/PLATELET
Abs Immature Granulocytes: 0.06 K/uL (ref 0.00–0.07)
Basophils Absolute: 0 K/uL (ref 0.0–0.1)
Basophils Relative: 0 %
Eosinophils Absolute: 0.1 K/uL (ref 0.0–0.5)
Eosinophils Relative: 1 %
HCT: 36.5 % (ref 36.0–46.0)
Hemoglobin: 11.3 g/dL — ABNORMAL LOW (ref 12.0–15.0)
Immature Granulocytes: 1 %
Lymphocytes Relative: 22 %
Lymphs Abs: 2 K/uL (ref 0.7–4.0)
MCH: 26.3 pg (ref 26.0–34.0)
MCHC: 31 g/dL (ref 30.0–36.0)
MCV: 85.1 fL (ref 80.0–100.0)
Monocytes Absolute: 0.7 K/uL (ref 0.1–1.0)
Monocytes Relative: 8 %
Neutro Abs: 6.2 K/uL (ref 1.7–7.7)
Neutrophils Relative %: 68 %
Platelets: 357 K/uL (ref 150–400)
RBC: 4.29 MIL/uL (ref 3.87–5.11)
RDW: 14.4 % (ref 11.5–15.5)
WBC: 9.1 K/uL (ref 4.0–10.5)
nRBC: 0 % (ref 0.0–0.2)

## 2024-09-04 LAB — COMPREHENSIVE METABOLIC PANEL WITH GFR
ALT: 19 U/L (ref 0–44)
AST: 30 U/L (ref 15–41)
Albumin: 3.9 g/dL (ref 3.5–5.0)
Alkaline Phosphatase: 104 U/L (ref 38–126)
Anion gap: 14 (ref 5–15)
BUN: 21 mg/dL (ref 8–23)
CO2: 24 mmol/L (ref 22–32)
Calcium: 8.9 mg/dL (ref 8.9–10.3)
Chloride: 104 mmol/L (ref 98–111)
Creatinine, Ser: 1.26 mg/dL — ABNORMAL HIGH (ref 0.44–1.00)
GFR, Estimated: 45 mL/min — ABNORMAL LOW (ref >60)
Glucose, Bld: 87 mg/dL (ref 70–99)
Potassium: 3.9 mmol/L (ref 3.5–5.1)
Sodium: 142 mmol/L (ref 135–145)
Total Bilirubin: 0.2 mg/dL (ref 0.0–1.2)
Total Protein: 7.6 g/dL (ref 6.5–8.1)

## 2024-09-04 LAB — LACTIC ACID, PLASMA: Lactic Acid, Venous: 2 mmol/L (ref 0.5–1.9)

## 2024-09-04 MED ORDER — CHOLECALCIFEROL 10 MCG (400 UNIT) PO TABS
400.0000 [IU] | ORAL_TABLET | Freq: Every day | ORAL | Status: DC
  Administered 2024-09-04 – 2024-09-07 (×4): 400 [IU] via ORAL
  Filled 2024-09-04 (×4): qty 1

## 2024-09-04 MED ORDER — MIRTAZAPINE 30 MG PO TABS
30.0000 mg | ORAL_TABLET | Freq: Every day | ORAL | Status: DC
Start: 2024-09-04 — End: 2024-09-07
  Administered 2024-09-05 – 2024-09-06 (×2): 30 mg via ORAL
  Filled 2024-09-04 (×3): qty 1

## 2024-09-04 MED ORDER — SODIUM CHLORIDE 0.9 % IV SOLN
2.0000 g | Freq: Two times a day (BID) | INTRAVENOUS | Status: DC
  Administered 2024-09-04 – 2024-09-07 (×6): 2 g via INTRAVENOUS
  Filled 2024-09-04 (×7): qty 12.5

## 2024-09-04 MED ORDER — VANCOMYCIN HCL IN DEXTROSE 1-5 GM/200ML-% IV SOLN
1000.0000 mg | Freq: Once | INTRAVENOUS | Status: AC
  Administered 2024-09-04: 1000 mg via INTRAVENOUS
  Filled 2024-09-04: qty 200

## 2024-09-04 MED ORDER — VANCOMYCIN HCL 1250 MG/250ML IV SOLN
1250.0000 mg | INTRAVENOUS | Status: DC
Start: 2024-09-06 — End: 2024-09-06

## 2024-09-04 MED ORDER — ONDANSETRON HCL 4 MG/2ML IJ SOLN: 4.0000 mg | Freq: Four times a day (QID) | INTRAMUSCULAR | Status: DC | PRN

## 2024-09-04 MED ORDER — ADULT MULTIVITAMIN W/MINERALS CH
1.0000 | ORAL_TABLET | Freq: Every day | ORAL | Status: DC
  Administered 2024-09-04 – 2024-09-07 (×4): 1 via ORAL
  Filled 2024-09-04 (×4): qty 1

## 2024-09-04 MED ORDER — SODIUM CHLORIDE 0.9 % IV SOLN
2.0000 g | Freq: Once | INTRAVENOUS | Status: AC
  Administered 2024-09-04: 2 g via INTRAVENOUS
  Filled 2024-09-04: qty 12.5

## 2024-09-04 MED ORDER — ACETAMINOPHEN 325 MG PO TABS
650.0000 mg | ORAL_TABLET | Freq: Four times a day (QID) | ORAL | Status: DC | PRN
  Administered 2024-09-06: 650 mg via ORAL
  Filled 2024-09-04: qty 2

## 2024-09-04 MED ORDER — ATORVASTATIN CALCIUM 40 MG PO TABS
40.0000 mg | ORAL_TABLET | Freq: Every day | ORAL | Status: DC
  Administered 2024-09-05 – 2024-09-07 (×3): 40 mg via ORAL
  Filled 2024-09-04 (×3): qty 1

## 2024-09-04 MED ORDER — TRAMADOL HCL 50 MG PO TABS
100.0000 mg | ORAL_TABLET | Freq: Four times a day (QID) | ORAL | Status: DC | PRN
  Administered 2024-09-04 – 2024-09-07 (×7): 100 mg via ORAL
  Filled 2024-09-04 (×7): qty 2

## 2024-09-04 MED ORDER — ACETAMINOPHEN 650 MG RE SUPP: 650.0000 mg | Freq: Four times a day (QID) | RECTAL | Status: DC | PRN

## 2024-09-04 MED ORDER — ENOXAPARIN SODIUM 40 MG/0.4ML IJ SOSY
40.0000 mg | PREFILLED_SYRINGE | INTRAMUSCULAR | Status: DC
  Administered 2024-09-04 – 2024-09-06 (×3): 40 mg via SUBCUTANEOUS
  Filled 2024-09-04 (×3): qty 0.4

## 2024-09-04 MED ORDER — PANTOPRAZOLE SODIUM 40 MG PO TBEC
40.0000 mg | DELAYED_RELEASE_TABLET | Freq: Every day | ORAL | Status: DC
  Administered 2024-09-05 – 2024-09-07 (×3): 40 mg via ORAL
  Filled 2024-09-04 (×4): qty 1

## 2024-09-04 MED ORDER — VITAMIN C 500 MG PO TABS
500.0000 mg | ORAL_TABLET | Freq: Every day | ORAL | Status: DC
Start: 2024-09-04 — End: 2024-09-06
  Administered 2024-09-04 – 2024-09-06 (×3): 500 mg via ORAL
  Filled 2024-09-04 (×3): qty 1

## 2024-09-04 MED ORDER — ONDANSETRON HCL 4 MG PO TABS: 4.0000 mg | ORAL_TABLET | Freq: Four times a day (QID) | ORAL | Status: DC | PRN

## 2024-09-04 NOTE — ED Notes (Signed)
 Transport called to take patient up to room.

## 2024-09-04 NOTE — ED Notes (Signed)
 US  at bedside to take pt. Will start IV antibiotics when pt returns

## 2024-09-04 NOTE — Hospital Course (Signed)
 73 year old female with a history of GERD, hypertension, hyperlipidemia venous insufficiency, chronic pain syndrome with opioid dependence, iron  deficiency anemia presenting with increasing pain and swelling in the right foot and great toe. The patient has been struggling with a callus on her right great toe for which she follows podiatry, Dr. SHAUNNA Blanch.  The patient states that she saw Dr. Blanch in the office on 08/28/2024 at which time her great toe callus was trimmed on the right.  An Unna boot was placed on the right lower extremity secondary to her history of venous insufficiency.  She stated that she had her Unna boot changed on 08/30/2024 and 09/01/2024.  Her right lower extremity was not swollen or red at that time.  However, she noted increasing pain in her right lower extremity and foot starting on 09/03/2024.  She went back to see Dr. Blanch in the office on the day of admission.  After her dressing was unwrapped, it was noted the patient had increasing erythema about the foot and swelling.  Pus was expressed from the great toe.  There was concern for osteomyelitis.  He was instructed to go to the emergency department by Dr. Blanch. In the ED, the patient was afebrile and hemodynamically stable with oxygen  saturation 93% room air.  WBC 9.1, hemoglobin 11.3, platelet 257.  Sodium 142, potassium 3.9, bicarbonate 24, serum creatinine 1.26.  AST 30, ALT 19, alk phosphatase 104, total bilirubin 0.2.  X-rays were great toe shows an open wound in the right great toe with underlying destructive bone changes consistent with osteomyelitis.  EDP spoke with Dr. Blanch who plans for operative intervention on 09/06/2024

## 2024-09-04 NOTE — ED Notes (Signed)
 ED TO INPATIENT HANDOFF REPORT  ED Nurse Name and Phone #: .   S Name/Age/Gender Stacy Case 73 y.o. female Room/Bed: APOTF/OTF  Code Status   Code Status: Prior  Home/SNF/Other Home Patient oriented to: self, place, time, and situation Is this baseline? Yes   Triage Complete: Triage complete  Chief Complaint Acute osteomyelitis of toe (HCC) [M86.179]  Triage Note Pt arrived via POV from home for evaluation of possible osteomyelitis in her right great toe. Pt report seeing Dr Tobie recently and was told her bone is visible and it is leaking large amounts of puss.    Allergies Allergies  Allergen Reactions   Penicillamine Rash and Swelling   Clindamycin Other (See Comments)    Caused c. diff   Penicillins Rash    Level of Care/Admitting Diagnosis ED Disposition     ED Disposition  Admit   Condition  --   Comment  Hospital Area: [facility] [[id number]]  Level of Care: Med-Surg [16]  Diagnosis: Acute osteomyelitis of toe [facility]) [[id number]]  Admitting Physician: TAT, DAVID Darianne.Cuna  Attending Physician: TAT, DAVID Darianne.Cuna  Certification:: I certify this patient will need inpatient services for at least 2 midnights  Expected Medical Readiness: 09/07/2024          B Medical/Surgery History Past Medical History:  Diagnosis Date   Anxiety    Arthritis    Cellulitis of ankle    COPD (chronic obstructive pulmonary disease) (HCC)    Cystocele    Depression    GERD (gastroesophageal reflux disease)    Headache    hx of migraines   Hyperlipidemia    Hypertension    IBS (irritable bowel syndrome)    Peripheral arterial disease    legs   Pneumonia    Right inguinal hernia    Past Surgical History:  Procedure Laterality Date   ABDOMINAL AORTAGRAM N/A 11/20/2013   Procedure: ABDOMINAL EZELLA;  Surgeon: Lonni GORMAN Blade, MD;  Location: [facility] CATH LAB;  Service: Cardiovascular;  Laterality: N/A;   APPENDECTOMY     JOINT REPLACEMENT     Left hip 2018    TOTAL HIP ARTHROPLASTY Right 02/28/2020   Procedure: TOTAL HIP ARTHROPLASTY ANTERIOR APPROACH;  Surgeon: Melodi Lerner, MD;  Location: [facility] OR[facility];  Service: Orthopedics;  Laterality: Right;    WRIST SURGERY       A IV Location/Drains/Wounds Patient Lines/Drains/Airways Status     Active Line/Drains/Airways     Name Placement date Placement time Site Days   Peripheral IV 09/04/24 22 G Left Antecubital 09/04/24  1733  Antecubital  less than 1            Intake/Output Last 24 hours No intake or output data in the 24 hours ending 09/04/24 1743  Labs/Imaging Results for orders placed or performed during the hospital encounter of 09/04/24 (from the past 48 hours)  CBC with Differential     Status: Abnormal   Collection Time: 09/04/24  1:36 PM  Result Value Ref Range   WBC 9.1 4.0 - 10.5 K/uL   RBC 4.29 3.87 - 5.11 MIL/uL   Hemoglobin 11.3 (L) 12.0 - 15.0 g/dL   HCT 63.4 63.9 - 53.9 %   MCV 85.1 80.0 - 100.0 fL   MCH 26.3 26.0 - 34.0 pg   MCHC 31.0 30.0 - 36.0 g/dL   RDW 85.5 88.4 - 84.4 %   Platelets 357 150 - 400 K/uL   nRBC 0.0 0.0 - 0.2 %   Neutrophils Relative %  68 %   Neutro Abs 6.2 1.7 - 7.7 K/uL   Lymphocytes Relative 22 %   Lymphs Abs 2.0 0.7 - 4.0 K/uL   Monocytes Relative 8 %   Monocytes Absolute 0.7 0.1 - 1.0 K/uL   Eosinophils Relative 1 %   Eosinophils Absolute 0.1 0.0 - 0.5 K/uL   Basophils Relative 0 %   Basophils Absolute 0.0 0.0 - 0.1 K/uL   Immature Granulocytes 1 %   Abs Immature Granulocytes 0.06 0.00 - 0.07 K/uL    Comment: Performed at [facility], [address], [place], [place] [address]  Comprehensive metabolic panel     Status: Abnormal   Collection Time: 09/04/24  1:36 PM  Result Value Ref Range   Sodium 142 135 - 145 mmol/L   Potassium 3.9 3.5 - 5.1 mmol/L   Chloride 104 98 - 111 mmol/L   CO2 24 22 - 32 mmol/L   Glucose, Bld 87 70 - 99 mg/dL    Comment: Glucose reference range applies only to samples taken after fasting for at  least 8 hours.   BUN 21 8 - 23 mg/dL   Creatinine, Ser 8.73 (H) 0.44 - 1.00 mg/dL   Calcium  8.9 8.9 - 10.3 mg/dL   Total Protein 7.6 6.5 - 8.1 g/dL   Albumin 3.9 3.5 - 5.0 g/dL   AST 30 15 - 41 U/L   ALT 19 0 - 44 U/L   Alkaline Phosphatase 104 38 - 126 U/L   Total Bilirubin 0.2 0.0 - 1.2 mg/dL   GFR, Estimated 45 (L) >60 mL/min    Comment: (NOTE) Calculated using the CKD-EPI Creatinine Equation (2021)    Anion gap 14 5 - 15    Comment: Performed at [facility], [address] [place], [place] [address]  Lactic acid, plasma     Status: Abnormal   Collection Time: 09/04/24  1:36 PM  Result Value Ref Range   Lactic Acid, Venous 2.0 (HH) 0.5 - 1.9 mmol/L    Comment: Critical Value, Read Back and verified with DOROTHA Jubilee RN 09/04/24 @1427  by JINNY. White Performed at [facility], [address], [place], [place] [address]    DG Toe Great Right Result Date: 09/04/2024 CLINICAL DATA:  Open wound involving the great toe. EXAM: RIGHT GREAT TOE COMPARISON:  None Available. FINDINGS: Open wound noted along the distal aspect of the great toe with underlying destructive bony changes consistent with osteomyelitis. No findings suspicious for septic arthritis. IMPRESSION: Open wound along the distal aspect of the great toe with underlying destructive bony changes consistent with osteomyelitis. Electronically Signed   By: MYRTIS Stammer M.D.   On: 09/04/2024 14:43    Pending Labs Unresulted Labs (From admission, onward)     Start     Ordered   09/04/24 1730  Culture, blood (single)  Once,   R        09/04/24 1730   09/04/24 1311  Lactic acid, plasma  (Lactic Acid)  STAT Now then every 2 hours,   R (with STAT occurrences)     Question:  Release to patient  Answer:  Immediate   09/04/24 1310   09/04/24 1311  Culture, blood (single)  Once,   STAT       Question Answer Comment  Patient immune status Normal   Release to patient Immediate      09/04/24 1310            Vitals/Pain Today's  Vitals   09/04/24 1630 09/04/24  1729 09/04/24 1732 09/04/24 1736  BP: 100/82 121/82    Pulse: 68 69 69   Resp: 17 16    Temp:    97.9 F (36.6 C)  TempSrc:    Oral  SpO2: 93% 94% 94%   Weight:      Height:      PainSc:        Isolation Precautions No active isolations  Medications Medications  vancomycin  (VANCOCIN ) IVPB 1000 mg/200 mL premix (0 mg Intravenous Paused 09/04/24 1739)  ceFEPIme (MAXIPIME) 2 g in sodium chloride  0.9 % 100 mL IVPB (0 g Intravenous Paused 09/04/24 1739)    Mobility walks with device     Focused Assessments Right great toe red, open, with drainage   R Recommendations: See Admitting Provider Note  Report given to:   Additional Notes: pt has rollator walker at bedside with large stitch garmet bag and purse at bedside

## 2024-09-04 NOTE — Consult Note (Signed)
 Pharmacy Antibiotic Note  Anthonia Monger is a 73 y.o. female admitted on 09/04/2024 with increasing pain and swelling in the right foot and great toe.  Pharmacy has been consulted for Vancomycin  and Cefepime dosing.  Plan: Vancomycin  1000 mg IV x 1 given as loading dose, followed by: Vancomycin  1250 Q48 hrs. Goal AUC 400-550. Expected AUC: 527.0 Expected Cmin: 10.2 SCr used: 1.26  Cefepime 2g IV Q12 hours  Height: 5' 2 (157.5 cm) Weight: 54.7 kg (120 lb 11.2 oz) IBW/kg (Calculated) : 50.1  Temp (24hrs), Avg:98.1 F (36.7 C), Min:97.8 F (36.6 C), Max:98.6 F (37 C)  Recent Labs  Lab 09/04/24 1336  WBC 9.1  CREATININE 1.26*  LATICACIDVEN 2.0*    Estimated Creatinine Clearance: 31 mL/min (A) (by C-G formula based on SCr of 1.26 mg/dL (H)).    Allergies  Allergen Reactions   Penicillamine Rash and Swelling   Clindamycin Other (See Comments)    Caused c. diff   Penicillins Rash    Antimicrobials this admission: Vancomycin  10/20 >>  Cefepime 10/20 >>   Dose adjustments this admission: N/A  Microbiology results: 10/20 BCx: collected   Thank you for allowing pharmacy to be a part of this patient's care.  Idolina A Euva Rundell 09/04/2024 6:13 PM

## 2024-09-04 NOTE — H&P (Signed)
 History and Physical    Patient: Stacy Case FMW:[id number] DOB: September 02, 1951 DOA: 09/04/2024 DOS: the patient was seen and examined on 09/04/2024 PCP: [name], MD  Patient coming from: Home  Chief Complaint:  Chief Complaint  Patient presents with   Toe Pain   HPI: Stacy Case is a 73 year old female with a history of GERD, hypertension, hyperlipidemia venous insufficiency, chronic pain syndrome with opioid dependence, iron  deficiency anemia presenting with increasing pain and swelling in the right foot and great toe. The patient has been struggling with a callus on her right great toe for which she follows podiatry, Dr. SHAUNNA Blanch.  The patient states that she saw Dr. Blanch in the office on 08/28/2024 at which time her great toe callus was trimmed on the right.  An Unna boot was placed on the right lower extremity secondary to her history of venous insufficiency.  She stated that she had her Unna boot changed on 08/30/2024 and 09/01/2024.  Her right lower extremity was not swollen or red at that time.  However, she noted increasing pain in her right lower extremity and foot starting on 09/03/2024.  She went back to see Dr. Blanch in the office on the day of admission.  After her dressing was unwrapped, it was noted the patient had increasing erythema about the foot and swelling.  Pus was expressed from the great toe.  There was concern for osteomyelitis.  He was instructed to go to the emergency department by Dr. Blanch. In the ED, the patient was afebrile and hemodynamically stable with oxygen  saturation 93% room air.  WBC 9.1, hemoglobin 11.3, platelet 257.  Sodium 142, potassium 3.9, bicarbonate 24, serum creatinine 1.26.  AST 30, ALT 19, alk phosphatase 104, total bilirubin 0.2.  X-rays were great toe shows an open wound in the right great toe with underlying destructive bone changes consistent with osteomyelitis.  EDP spoke with Dr. Blanch who plans for operative intervention on  09/06/2024  Review of Systems: As mentioned in the history of present illness. All other systems reviewed and are negative. Past Medical History:  Diagnosis Date   Anxiety    Arthritis    Cellulitis of ankle    COPD (chronic obstructive pulmonary disease) (HCC)    Cystocele    Depression    GERD (gastroesophageal reflux disease)    Headache    hx of migraines   Hyperlipidemia    Hypertension    IBS (irritable bowel syndrome)    Peripheral arterial disease    legs   Pneumonia    Right inguinal hernia    Past Surgical History:  Procedure Laterality Date   ABDOMINAL AORTAGRAM N/A 11/20/2013   Procedure: ABDOMINAL EZELLA;  Surgeon: Lonni GORMAN Blade, MD;  Location: [facility] CATH LAB;  Service: Cardiovascular;  Laterality: N/A;   APPENDECTOMY     JOINT REPLACEMENT     Left hip 2018   TOTAL HIP ARTHROPLASTY Right 02/28/2020   Procedure: TOTAL HIP ARTHROPLASTY ANTERIOR APPROACH;  Surgeon: Melodi Lerner, MD;  Location: [facility] ORS;  Service: Orthopedics;  Laterality: Right;    WRIST SURGERY     Social History:  reports that she quit smoking about 7 years ago. Her smoking use included cigarettes. She started smoking about 47 years ago. She has a 40 pack-year smoking history. She has never used smokeless tobacco. She reports that she does not drink alcohol  and does not use drugs.  Allergies  Allergen Reactions   Penicillamine Rash and Swelling   Clindamycin  Other (See Comments)    Caused c. diff   Penicillins Rash    Family History  Problem Relation Age of Onset   Varicose Veins Mother    Clotting disorder Mother    Diabetes Brother    Hypertension Brother    Diabetes Daughter    Hyperlipidemia Daughter    Hypertension Daughter     Prior to Admission medications   Medication Sig Start Date End Date Taking? Authorizing Provider  alendronate (FOSAMAX) 70 MG tablet Take 70 mg by mouth every 7 (seven) days. 09/03/19   [provider]  ascorbic acid (VITAMIN C) 500  MG tablet Take 500 mg by mouth daily.    [provider]  atenolol  (TENORMIN ) 50 MG tablet Take 50 mg by mouth daily.    [provider]  atorvastatin  (LIPITOR) 40 MG tablet Take 40 mg by mouth daily. 12/04/19   [provider]  Cholecalciferol (VITAMIN D3) 10 MCG (400 UNIT) CAPS Take 400 Units by mouth daily.    [provider]  dicyclomine  (BENTYL ) 20 MG tablet Take 20 mg by mouth 4 (four) times daily - after meals and at bedtime. 12/17/19   [provider]  diltiazem  (TIAZAC ) 240 MG 24 hr capsule Take 240 mg by mouth daily. 08/08/19   [provider]  furosemide  (LASIX ) 20 MG tablet Take 20 mg by mouth daily.    [provider]  HYDROcodone -acetaminophen  (NORCO/VICODIN) 5-325 MG tablet Take 1-2 tablets by mouth every 6 (six) hours as needed for moderate pain (pain score 4-6). 02/29/20   Patti Rosina SAUNDERS, PA-C  iron  polysaccharides (NIFEREX) 150 MG capsule Take 150 mg by mouth 2 (two) times daily.    [provider]  loratadine  (CLARITIN ) 10 MG tablet Take 10 mg by mouth daily.    [provider]  methocarbamol  (ROBAXIN ) 500 MG tablet Take 1 tablet (500 mg total) by mouth every 6 (six) hours as needed for muscle spasms. 02/29/20   Patti Rosina SAUNDERS, PA-C  mirtazapine  (REMERON ) 30 MG tablet Take 30 mg by mouth at bedtime.    [provider]  Multiple Vitamin (MULTIVITAMIN WITH MINERALS) TABS tablet Take 1 tablet by mouth daily.    [provider]  ondansetron  (ZOFRAN ) 8 MG tablet Take 8 mg by mouth every 8 (eight) hours as needed for nausea or vomiting.    [provider]  pantoprazole  (PROTONIX ) 40 MG tablet Take 40 mg by mouth daily.    [provider]  silver sulfADIAZINE (SILVADENE) 1 % cream Apply 1 application topically daily as needed (irritation).    [provider]  traMADol  (ULTRAM ) 50 MG tablet Take 100 mg by mouth every 6 (six) hours as needed for moderate pain.     [provider]    Physical Exam: Vitals:   09/04/24 1306 09/04/24 1307 09/04/24 1615 09/04/24 1616  BP:  117/78 116/78   Pulse:  68 61 63  Resp:  16    Temp:  98.6 F (37 C)    TempSrc:  Oral    SpO2:  92%  94%  Weight: 55.2 kg     Height: 5' 2 (1.575 m)      GENERAL:  A&O x 3, NAD, well developed, cooperative, follows commands HEENT: [place]/AT, No thrush, No icterus, No oral ulcers Neck:  No neck mass, No meningismus, soft, supple CV: RRR, no S3, no S4, no rub, no JVD Lungs:  CTA, no wheeze, no rhonchi, good air movement Abd: soft/NT +BS,  nondistended Ext: Erythema and edema about the dorsum aspect of the right foot.  Right great toe with open wound.  Distal phalanx palpable.  Tender to palpation. Neuro:  CN II-XII intact, strength 4/5 in RUE, RLE, strength 4/5 LUE, LLE; sensation intact bilateral; no dysmetria; babinski equivocal  Data Reviewed: Data reviewed above in the history  Assessment and Plan: Cellulitis/acute osteomyelitis right great toe -Continue vancomycin  and cefepime - EDP spoke with Dr. Tobie who plans for operative management 09/06/2024 - Arterial Doppler - Judicious opioids  Chronic pain syndrome/opioid dependence - PDMP reviewed - Tramadol  50 mg, #240, last refill 08/28/2024  Essential hypertension - Holding atenolol  and diltiazem  temporarily secondary to soft blood pressure  CKD stage IIIa - Baseline creatinine 0.9-1.2 - Follow BMP  Mixed hyperlipidemia - Continue statin  Venous stasis dermatitis - Patient is on furosemide  chronically - This will be held temporarily  GERD - Continue pantoprazole     Advance Care Planning: FULL  Consults: podiatry--P Patel  Family Communication: none present  Severity of Illness: The appropriate patient status for this patient is INPATIENT. Inpatient status is judged to be reasonable and necessary in order to provide the required intensity of service to ensure the patient's safety. The  patient's presenting symptoms, physical exam findings, and initial radiographic and laboratory data in the context of their chronic comorbidities is felt to place them at high risk for further clinical deterioration. Furthermore, it is not anticipated that the patient will be medically stable for discharge from the hospital within 2 midnights of admission.   * I certify that at the point of admission it is my clinical judgment that the patient will require inpatient hospital care spanning beyond 2 midnights from the point of admission due to high intensity of service, high risk for further deterioration and high frequency of surveillance required.*  Author: Alm Schneider, MD 09/04/2024 4:36 PM  For on call review [email or web address].

## 2024-09-04 NOTE — ED Triage Notes (Signed)
 Pt arrived via POV from home for evaluation of possible osteomyelitis in her right great toe. Pt report seeing Dr Tobie recently and was told her bone is visible and it is leaking large amounts of puss.

## 2024-09-04 NOTE — ED Notes (Signed)
 ED Provider at bedside.

## 2024-09-04 NOTE — ED Provider Notes (Signed)
 [place] EMERGENCY DEPARTMENT AT [facility] Provider Note   CSN: [id number] Arrival date & time: 09/04/24  1242     Patient presents with: Toe Pain   Stacy Case is a 73 y.o. female.    Toe Pain   This patient is a 73 year old female, she has a history of hypertension on diltiazem , she has venous insufficiency of her legs and has had some significant difficulty with her right great toe with deep calluses and corns for which she has been seeing Dr. Tobie.  He has been doing some debridement of the toe and unfortunately when she went to the office today there appeared to be some exposed bone that was concerning for osteomyelitis.  She notes that her foot appears to be a little bit more red and swollen but she is not having fevers or chills or significant tenderness.  There was some purulence expressed from the tip of the toe and she was sent to the hospital for admission, she reports that Dr. Tobie has requested an MRI    Prior to Admission medications   Medication Sig Start Date End Date Taking? Authorizing Provider  alendronate (FOSAMAX) 70 MG tablet Take 70 mg by mouth every 7 (seven) days. 09/03/19   [provider]  ascorbic acid (VITAMIN C) 500 MG tablet Take 500 mg by mouth daily.    [provider]  atenolol  (TENORMIN ) 50 MG tablet Take 50 mg by mouth daily.    [provider]  atorvastatin  (LIPITOR) 40 MG tablet Take 40 mg by mouth daily. 12/04/19   [provider]  Cholecalciferol (VITAMIN D3) 10 MCG (400 UNIT) CAPS Take 400 Units by mouth daily.    [provider]  dicyclomine  (BENTYL ) 20 MG tablet Take 20 mg by mouth 4 (four) times daily - after meals and at bedtime. 12/17/19   [provider]  diltiazem  (TIAZAC ) 240 MG 24 hr capsule Take 240 mg by mouth daily. 08/08/19   [provider]  furosemide  (LASIX ) 20 MG tablet Take 20 mg by mouth daily.    [provider]  HYDROcodone -acetaminophen   (NORCO/VICODIN) 5-325 MG tablet Take 1-2 tablets by mouth every 6 (six) hours as needed for moderate pain (pain score 4-6). 02/29/20   Patti Rosina SAUNDERS, PA-C  iron  polysaccharides (NIFEREX) 150 MG capsule Take 150 mg by mouth 2 (two) times daily.    [provider]  loratadine  (CLARITIN ) 10 MG tablet Take 10 mg by mouth daily.    [provider]  methocarbamol  (ROBAXIN ) 500 MG tablet Take 1 tablet (500 mg total) by mouth every 6 (six) hours as needed for muscle spasms. 02/29/20   Patti Rosina SAUNDERS, PA-C  mirtazapine  (REMERON ) 30 MG tablet Take 30 mg by mouth at bedtime.    [provider]  Multiple Vitamin (MULTIVITAMIN WITH MINERALS) TABS tablet Take 1 tablet by mouth daily.    [provider]  ondansetron  (ZOFRAN ) 8 MG tablet Take 8 mg by mouth every 8 (eight) hours as needed for nausea or vomiting.    [provider]  pantoprazole  (PROTONIX ) 40 MG tablet Take 40 mg by mouth daily.    [provider]  silver sulfADIAZINE (SILVADENE) 1 % cream Apply 1 application topically daily as needed (irritation).    [provider]  traMADol  (ULTRAM ) 50 MG tablet Take 100 mg by mouth every 6 (six) hours as needed for moderate pain.    [provider]    Allergies: Penicillamine, Clindamycin, and Penicillins  Review of Systems  All other systems reviewed and are negative.   Updated Vital Signs BP 116/78   Pulse 63   Temp 98.6 F (37 C) (Oral)   Resp 16   Ht 1.575 m (5' 2)   Wt 55.2 kg   SpO2 94%   BMI 22.26 kg/m   Physical Exam Vitals and nursing note reviewed.  Constitutional:      General: She is not in acute distress.    Appearance: She is well-developed.  HENT:     Head: Normocephalic and atraumatic.     Mouth/Throat:     Pharynx: No oropharyngeal exudate.  Eyes:     General: No scleral icterus.       Right eye: No discharge.        Left eye: No discharge.     Conjunctiva/sclera: Conjunctivae normal.      Pupils: Pupils are equal, round, and reactive to light.  Neck:     Thyroid : No thyromegaly.     Vascular: No JVD.  Cardiovascular:     Rate and Rhythm: Normal rate and regular rhythm.     Heart sounds: Normal heart sounds. No murmur heard.    No friction rub. No gallop.  Pulmonary:     Effort: Pulmonary effort is normal. No respiratory distress.     Breath sounds: Normal breath sounds. No wheezing or rales.  Abdominal:     General: Bowel sounds are normal. There is no distension.     Palpations: Abdomen is soft. There is no mass.     Tenderness: There is no abdominal tenderness.  Musculoskeletal:        General: Tenderness present. Normal range of motion.     Cervical back: Normal range of motion and neck supple.     Right lower leg: No edema.     Left lower leg: No edema.     Comments: The tip of the right great toe appears to have an open wound with a small amount of exposed bone, there is some purulence, the foot is red and warm to the touch but not particular swollen compared to the other side.  Decreased pulses but symmetrical to the other side  Lymphadenopathy:     Cervical: No cervical adenopathy.  Skin:    General: Skin is warm and dry.     Findings: No erythema or rash.  Neurological:     Mental Status: She is alert.     Coordination: Coordination normal.  Psychiatric:        Behavior: Behavior normal.     (all labs ordered are listed, but only abnormal results are displayed) Labs Reviewed  CBC WITH DIFFERENTIAL/PLATELET - Abnormal; Notable for the following components:      Result Value   Hemoglobin 11.3 (*)    All other components within normal limits  COMPREHENSIVE METABOLIC PANEL WITH GFR - Abnormal; Notable for the following components:   Creatinine, Ser 1.26 (*)    GFR, Estimated 45 (*)    All other components within normal limits  LACTIC ACID, PLASMA - Abnormal; Notable for the following components:   Lactic Acid, Venous 2.0 (*)    All other components  within normal limits  CULTURE, BLOOD (SINGLE)  LACTIC ACID, PLASMA    EKG: None  Radiology: DG Toe Great Right Result Date: 09/04/2024 CLINICAL DATA:  Open wound involving the great toe. EXAM: RIGHT GREAT TOE COMPARISON:  None Available. FINDINGS: Open wound noted along the distal aspect of the great toe  with underlying destructive bony changes consistent with osteomyelitis. No findings suspicious for septic arthritis. IMPRESSION: Open wound along the distal aspect of the great toe with underlying destructive bony changes consistent with osteomyelitis. Electronically Signed   By: MYRTIS Stammer M.D.   On: 09/04/2024 14:43     Procedures   Medications Ordered in the ED  vancomycin  (VANCOCIN ) IVPB 1000 mg/200 mL premix (has no administration in time range)  ceFEPIme (MAXIPIME) 2 g in sodium chloride  0.9 % 100 mL IVPB (has no administration in time range)                                    Medical Decision Making Amount and/or Complexity of Data Reviewed Radiology: ordered.  Risk Prescription drug management. Decision regarding hospitalization.   This patient is not in any distress, her vital signs are normal, there is concern for osteomyelitis based on imaging  Labs:  I  personally viewed and interpreted the labs which show no leukocytosis, hemoglobin of 11.3, creatinine of 1.26 but otherwise unremarkable metabolic panel, lactate 2.0    Radiology Imaging: I personally viewed the images of the ordered radiographic studies and find x-ray findings consistent with an open wound of the great toe with destructive bony changes consistent with osteomyelitis I agree with the radiologist interpretation as well   MRI ordered   Consultations: I discussed the case with Dr. Tobie with podiatry who wants the patient to be admitted to the hospitalist and request an ultrasound of the leg which was ordered, discussed with Dr. Evonnie with the hospitalist, they recommend admission to the  hospital   Meds / Interventions: while in the ED the patient received the following: Antibiotics, cefepime requested by podiatry The response to the interventions was that the patient stable   Will admit to the hospital      Final diagnoses:  Osteomyelitis of great toe [facility])    ED Discharge Orders     None          Cleotilde Rogue, MD 09/04/24 1621

## 2024-09-04 NOTE — ED Notes (Signed)
 Patient transported to Ultrasound

## 2024-09-05 ENCOUNTER — Inpatient Hospital Stay (HOSPITAL_COMMUNITY)

## 2024-09-05 DIAGNOSIS — N1831 Chronic kidney disease, stage 3a: Secondary | ICD-10-CM | POA: Diagnosis not present

## 2024-09-05 DIAGNOSIS — M86171 Other acute osteomyelitis, right ankle and foot: Secondary | ICD-10-CM | POA: Diagnosis not present

## 2024-09-05 DIAGNOSIS — I10 Essential (primary) hypertension: Secondary | ICD-10-CM | POA: Diagnosis not present

## 2024-09-05 LAB — CBC
HCT: 38.2 % (ref 36.0–46.0)
Hemoglobin: 11.8 g/dL — ABNORMAL LOW (ref 12.0–15.0)
MCH: 26 pg (ref 26.0–34.0)
MCHC: 30.9 g/dL (ref 30.0–36.0)
MCV: 84.3 fL (ref 80.0–100.0)
Platelets: 313 K/uL (ref 150–400)
RBC: 4.53 MIL/uL (ref 3.87–5.11)
RDW: 14.2 % (ref 11.5–15.5)
WBC: 8.1 K/uL (ref 4.0–10.5)
nRBC: 0 % (ref 0.0–0.2)

## 2024-09-05 LAB — BASIC METABOLIC PANEL WITH GFR
Anion gap: 11 (ref 5–15)
BUN: 20 mg/dL (ref 8–23)
CO2: 25 mmol/L (ref 22–32)
Calcium: 9 mg/dL (ref 8.9–10.3)
Chloride: 105 mmol/L (ref 98–111)
Creatinine, Ser: 1.07 mg/dL — ABNORMAL HIGH (ref 0.44–1.00)
GFR, Estimated: 54 mL/min — ABNORMAL LOW (ref >60)
Glucose, Bld: 112 mg/dL — ABNORMAL HIGH (ref 70–99)
Potassium: 4 mmol/L (ref 3.5–5.1)
Sodium: 140 mmol/L (ref 135–145)

## 2024-09-05 MED ORDER — IOHEXOL 350 MG/ML SOLN
125.0000 mL | Freq: Once | INTRAVENOUS | Status: AC | PRN
  Administered 2024-09-05: 125 mL via INTRAVENOUS

## 2024-09-05 MED ORDER — DICYCLOMINE HCL 20 MG PO TABS
20.0000 mg | ORAL_TABLET | Freq: Three times a day (TID) | ORAL | Status: DC
  Filled 2024-09-05 (×3): qty 1

## 2024-09-05 NOTE — Plan of Care (Signed)
   Problem: Education: Goal: Knowledge of General Education information will improve Description Including pain rating scale, medication(s)/side effects and non-pharmacologic comfort measures Outcome: Progressing   Problem: Education: Goal: Knowledge of General Education information will improve Description Including pain rating scale, medication(s)/side effects and non-pharmacologic comfort measures Outcome: Progressing

## 2024-09-05 NOTE — Progress Notes (Signed)
 PROGRESS NOTE  Stacy Case FMW:[id number] DOB: Mar 24, 1951 DOA: 09/04/2024 PCP: [name], MD  Brief History:  73 year old female with a history of GERD, hypertension, hyperlipidemia venous insufficiency, chronic pain syndrome with opioid dependence, iron  deficiency anemia presenting with increasing pain and swelling in the right foot and great toe. The patient has been struggling with a callus on her right great toe for which she follows podiatry, Dr. SHAUNNA Blanch.  The patient states that she saw Dr. Blanch in the office on 08/28/2024 at which time her great toe callus was trimmed on the right.  An Unna boot was placed on the right lower extremity secondary to her history of venous insufficiency.  She stated that she had her Unna boot changed on 08/30/2024 and 09/01/2024.  Her right lower extremity was not swollen or red at that time.  However, she noted increasing pain in her right lower extremity and foot starting on 09/03/2024.  She went back to see Dr. Blanch in the office on the day of admission.  After her dressing was unwrapped, it was noted the patient had increasing erythema about the foot and swelling.  Pus was expressed from the great toe.  There was concern for osteomyelitis.  He was instructed to go to the emergency department by Dr. Blanch. In the ED, the patient was afebrile and hemodynamically stable with oxygen  saturation 93% room air.  WBC 9.1, hemoglobin 11.3, platelet 257.  Sodium 142, potassium 3.9, bicarbonate 24, serum creatinine 1.26.  AST 30, ALT 19, alk phosphatase 104, total bilirubin 0.2.  X-rays were great toe shows an open wound in the right great toe with underlying destructive bone changes consistent with osteomyelitis.  EDP spoke with Dr. Blanch who plans for operative intervention on 09/06/2024   Assessment/Plan: Cellulitis/acute osteomyelitis right great toe -Continue vancomycin  and cefepime - EDP spoke with Dr. Blanch who plans for operative management  09/06/2024 - Arterial Doppler--Minimally decreased RIGHT ankle-brachial index indicative of borderline peripheral arterial disease. LEFT dorsalis pedis ABI is within normal limits. - Judicious opioids - 10/21 CT angio AO+BIFEM--Right lower extremity: Patent inflow. Severe focal stenosis about the distal superficial femoral artery. 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 Left lower extremity: Patent inflow. Severe focal stenosis about the distal superficial femoral and proximal popliteal arteries. 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 - podiatry plans amputation/I&D right great toe 10/22   Chronic pain syndrome/opioid dependence - PDMP reviewed - Tramadol  50 mg, #240, last refill 08/28/2024   Essential hypertension - Holding atenolol  and diltiazem  temporarily secondary to soft blood pressure   CKD stage IIIa - Baseline creatinine 0.9-1.2 - Follow BMP   Mixed hyperlipidemia - Continue statin   Venous stasis dermatitis - Patient is on furosemide  chronically - This will be held temporarily   GERD - Continue pantoprazole    IBS -start Bentyl        Family Communication:   no Family at bedside  Consultants:  podiatry  Code Status:  FULL DVT Prophylaxis:  [place] Lovenox   Procedures: As Listed in Progress Note Above  Antibiotics: Vanc 1020>> Cefepime 10/20>>     Subjective: Patient denies fevers, chills, headache, chest pain, dyspnea, nausea, vomiting, diarrhea, abdominal pain, dysuria, hematuria, hematochezia, and melena.   Objective: Vitals:   09/05/24 0206 09/05/24 0558 09/05/24 0810 09/05/24 1407  BP: 130/80 105/88 (!) 161/72 (!) 110/95  Pulse: (!) 56 68 70 82  Resp: 20 17 18 19   Temp: 98 F (36.7 C) 98.4 F (36.9 C) 98.6 F (37 C) 98.2 F (36.8 C)  TempSrc: Oral Oral Oral   SpO2: 91% 94% 93% 96%  Weight:      Height:        Intake/Output Summary (Last 24  hours) at 09/05/2024 1816 Last data filed at 09/05/2024 1300 Gross per 24 hour  Intake 1130 ml  Output --  Net 1130 ml   Weight change:  Exam:  General:  Pt is alert, follows commands appropriately, not in acute distress HEENT: No icterus, No thrush, No neck mass, [place]/AT Cardiovascular: RRR, S1/S2, no rubs, no gallops Respiratory: CTA bilaterally, no wheezing, no crackles, no rhonchi Abdomen: Soft/+BS, non tender, non distended, no guarding Extremities: No edema, No lymphangitis, No petechiae, No rashes, no synovitis  right foot in bulky dression   Data Reviewed: I have personally reviewed following labs and imaging studies Basic Metabolic Panel: Recent Labs  Lab 09/04/24 1336 09/05/24 0446  NA 142 140  K 3.9 4.0  CL 104 105  CO2 24 25  GLUCOSE 87 112*  BUN 21 20  CREATININE 1.26* 1.07*  CALCIUM  8.9 9.0   Liver Function Tests: Recent Labs  Lab 09/04/24 1336  AST 30  ALT 19  ALKPHOS 104  BILITOT 0.2  PROT 7.6  ALBUMIN 3.9   No results for input(s): LIPASE, AMYLASE in the last 168 hours. No results for input(s): AMMONIA in the last 168 hours. Coagulation Profile: No results for input(s): INR, PROTIME in the last 168 hours. CBC: Recent Labs  Lab 09/04/24 1336 09/05/24 0446  WBC 9.1 8.1  NEUTROABS 6.2  --   HGB 11.3* 11.8*  HCT 36.5 38.2  MCV 85.1 84.3  PLT 357 313   Cardiac Enzymes: No results for input(s): CKTOTAL, CKMB, CKMBINDEX, TROPONINI in the last 168 hours. BNP: Invalid input(s): POCBNP CBG: No results for input(s): GLUCAP in the last 168 hours. HbA1C: No results for input(s): HGBA1C in the last 72 hours. Urine analysis: No results found for: COLORURINE, APPEARANCEUR, LABSPEC, PHURINE, GLUCOSEU, HGBUR, BILIRUBINUR, KETONESUR, PROTEINUR, UROBILINOGEN, NITRITE, LEUKOCYTESUR Sepsis Labs: @LABRCNTIP (procalcitonin:4,lacticidven:4) ) Recent Results (from the past 240 hours)  Culture, blood  (single)     Status: None (Preliminary result)   Collection Time: 09/04/24  1:36 PM   Specimen: BLOOD  Result Value Ref Range Status   Specimen Description BLOOD BLOOD RIGHT ARM  Final   Special Requests   Final    BOTTLES DRAWN AEROBIC AND ANAEROBIC Blood Culture adequate volume   Culture   Final    NO GROWTH < 24 HOURS Performed at [facility], [address] [place], [place] [address]    Report Status PENDING  Incomplete  Culture, blood (single)     Status: None (Preliminary result)   Collection Time: 09/04/24  5:33 PM   Specimen: BLOOD  Result Value Ref Range Status   Specimen Description BLOOD BLOOD RIGHT HAND  Final   Special Requests   Final    AEROBIC BOTTLE ONLY Blood Culture results may not be optimal due to an inadequate volume of blood received in culture bottles   Culture   Final    NO GROWTH < 12 HOURS Performed at [facility], [address], [place], [place] [address]    Report Status PENDING  Incomplete     Scheduled Meds:  ascorbic acid  500 mg Oral Daily   atorvastatin   40 mg Oral Daily   cholecalciferol  400 Units Oral Daily   dicyclomine   20 mg Oral TID PC & HS   enoxaparin (LOVENOX) injection  40 mg Subcutaneous Q24H   mirtazapine   30 mg Oral QHS   multivitamin with minerals  1 tablet Oral Daily   pantoprazole   40 mg Oral Daily   Continuous Infusions:  ceFEPime (MAXIPIME) IV 2 g (09/05/24 1109)   [START ON 09/06/2024] vancomycin       Procedures/Studies: CT ANGIO AO+BIFEM W & OR WO CONTRAST Result Date: 09/05/2024 CLINICAL DATA:  [age] year right first toe ulceration. EXAM: CT ANGIOGRAPHY OF ABDOMINAL AORTA WITH ILIOFEMORAL RUNOFF TECHNIQUE: Multidetector CT imaging of the abdomen, pelvis and lower extremities was performed using the standard protocol during bolus administration of intravenous contrast. Multiplanar CT image reconstructions and MIPs were obtained to evaluate the vascular anatomy. RADIATION DOSE REDUCTION: This exam was performed  according to the departmental dose-optimization program which includes automated exposure control, adjustment of the mA and/or kV according to patient size and/or use of iterative reconstruction technique. CONTRAST:  125mL OMNIPAQUE IOHEXOL 350 MG/ML SOLN COMPARISON:  None Available. FINDINGS: VASCULAR Aorta: Normal caliber with circumferential fibrofatty and calcific atherosclerotic changes with diffuse luminal irregularity most prominent about the infrarenal portion. Celiac: Patent without evidence of aneurysm, dissection, vasculitis or significant stenosis. SMA: Patent without evidence of aneurysm, dissection, vasculitis or significant stenosis. Renals: Moderate to severe proximal ostial stenoses secondary to calcified atherosclerotic plaques of the single bilateral renal arteries. IMA: Patent without evidence of aneurysm, dissection, vasculitis or significant stenosis. RIGHT Lower Extremity Inflow: Common, internal and external iliac arteries are patent without evidence of aneurysm, dissection, vasculitis or significant stenosis. Coarse, nearly diffuse atherosclerotic calcifications. Outflow: The common and profundal femoral arteries are patent. The superficial femoral artery is patent proximally with scattered atherosclerotic calcifications with severe focal stenosis just distal to Hunter's canal. Diffuse circumferential atherosclerotic calcific about the otherwise patent popliteal artery. Runoff: The anterior tibial artery is patent throughout. The peroneal artery is patent to the level of foot. The posterior tibial artery is chronically occluded throughout which. LEFT Lower Extremity Inflow: Common, internal and external iliac arteries are patent without evidence of aneurysm, dissection, vasculitis or significant stenosis. Coarse, nearly diffuse atherosclerotic calcifications. Outflow: The common femoral and profundal femoral arteries are patent. Superficial femoral artery is patent proximally with severe  focal stenosis just inferior to Hunter's canal. Additionally, there is a severe focal stenosis in the P1 segment of the popliteal artery. There is distal reconstitution however the remaining P3 and P3 segments of the popliteal are diminutive with scattered atherosclerotic calcifications. Runoff: The anterior tibial and peroneal arteries are patent to the level of the foot. 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 Veins: No obvious venous abnormality within the limitations of this arterial phase study. Review of the MIP images confirms the above findings. NON-VASCULAR Lower chest: Diffuse centrilobular emphysema. Hepatobiliary: There is a simple cyst in the right lobe of the liver measuring up to 2.0 cm. The gallbladder is present and relatively decompressed. There is severe common bile duct dilation measuring up to approximately 1.8 cm in maximum short axis diameter. Mild central intrahepatic biliary ductal dilation. Pancreas: Unremarkable. No pancreatic ductal dilatation or surrounding inflammatory changes. Spleen: Normal in size without focal abnormality. Adrenals/Urinary Tract: Adrenal glands are unremarkable. Hyperattenuating (mean Hounsfield units 83) mass in the left inferior pole measuring up to 1.3 cm. Kidneys are otherwise within normal limits. No hydronephrosis. Bladder is unremarkable, partially obscured by streak artifact in the pelvis. Stomach/Bowel: Stomach is  within normal limits. Appendix is not definitively identified. No evidence of bowel wall thickening, distention, or inflammatory changes. Lymphatic: No abdominopelvic lymphadenopathy. Reproductive: Obscured by streak artifact, there is suggestion of postsurgical changes after hysterectomy Other: Small right inguinal hernia containing fat small bowel without complicating features. No ascites. Musculoskeletal: Status post bilateral total hip arthroplasties without complicating features. No acute osseous abnormality. IMPRESSION:  VASCULAR 1. Right lower extremity: Patent inflow. Severe focal stenosis about the distal superficial femoral artery. 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 2. Left lower extremity: Patent inflow. Severe focal stenosis about the distal superficial femoral and proximal popliteal arteries. 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 3. Moderate to severe ostial stenoses about the single bilateral renal arteries secondary to calcified atherosclerotic plaque. 4.  Aortic Atherosclerosis (ICD10-I70.0). NON-VASCULAR 1. Hyperattenuating 1.3 cm left inferior pole renal mass. Recommend MRI without and with contrast for further characterization. 2. Indeterminate extrahepatic biliary ductal dilation. Recommend correlation with hepatic function/bilirubin and if further evaluation is warranted, consider MRCP. 3. Incidentally noted pulmonary emphysema (ICD10-J43.9). Ester Sides, MD [facility] Radiology Electronically Signed   By: Ester Sides M.D.   On: 09/05/2024 15:38   US  ARTERIAL ABI (SCREENING LOWER EXTREMITY) Result Date: 09/05/2024 CLINICAL DATA:  Total necrosis for 1 week. Former smoker. Hypertension. Hyperlipidemia. EXAM: NONINVASIVE PHYSIOLOGIC VASCULAR STUDY OF BILATERAL LOWER EXTREMITIES TECHNIQUE: Evaluation of both lower extremities were performed at rest, including calculation of ankle-brachial indices with single level pressure measurements and doppler recording. COMPARISON:  None available. FINDINGS: Right ABI:  0.92 Left ABI:  1.03 Right Lower Extremity: Posterior tibial and dorsalis and pedis artery waveforms are monophasic. Left Lower Extremity: Dorsalis pedis artery waveform is monophasic. Posterior tibial artery could not be assessed due to bandage in place. 0.9-0.99 Borderline PAD IMPRESSION: Minimally decreased RIGHT ankle-brachial index indicative of borderline  peripheral arterial disease. LEFT dorsalis pedis ABI is within normal limits. However, given monophasic waveform seen bilaterally, there may still be significant underlying peripheral arterial disease. Consider further evaluation with CT angiography abdominal aorta with runoff. Electronically Signed   By: Aliene Lloyd M.D.   On: 09/05/2024 07:45   DG Toe Great Right Result Date: 09/04/2024 CLINICAL DATA:  Open wound involving the great toe. EXAM: RIGHT GREAT TOE COMPARISON:  None Available. FINDINGS: Open wound noted along the distal aspect of the great toe with underlying destructive bony changes consistent with osteomyelitis. No findings suspicious for septic arthritis. IMPRESSION: Open wound along the distal aspect of the great toe with underlying destructive bony changes consistent with osteomyelitis. Electronically Signed   By: MYRTIS Stammer M.D.   On: 09/04/2024 14:43    Alm Schneider, DO  [facility]  If 7PM-7AM, please contact night-coverage [email or web address] Password [email or web address] 09/05/2024, 6:16 PM   LOS: 1 day

## 2024-09-05 NOTE — Progress Notes (Addendum)
 Nurse at bedside,patient had a friend in the room by the name of Olam Dabbs,patient's friend was very aggressive,asking about patient's medications,and wanting to make sure that the patient got her medications. Emotional support provided, asked patient if she had any needs or concerns at this time.Patient states  I take Remeron ,and Bentyl  at home,and this really helps. Dr Tat notified. Plan of care on going.

## 2024-09-05 NOTE — Progress Notes (Signed)
 Pt is active with [facility]. [facility]. TOC will follow for resumption orders.    09/05/24 0814  TOC Brief Assessment  Insurance and Status Reviewed  Patient has primary care physician Yes  Home environment has been reviewed Lives alone.  Prior level of function: Independent.  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

## 2024-09-05 NOTE — Consult Note (Signed)
 Patient is 73 y/o female seen at bedside today. Was seen by me on 09/04/24 at the office for chronic venous stasis ulcer. At the office complaints of right great toe pain. Had ulcer there which was being treated. When she presented to the office, bone was exposed and purulent drainage noted. She was complaining that toe has been really hurting and sharp pain. Was sent to emergency room after my visit. She states its still painful. The redness and swelling looks some better on the toe. Denies any calf pain or chest pain.   Past Medical History:  Diagnosis Date   Anxiety     Arthritis     Cellulitis of ankle     COPD (chronic obstructive pulmonary disease) (HCC)     Cystocele     Depression     GERD (gastroesophageal reflux disease)     Headache      hx of migraines   Hyperlipidemia     Hypertension     IBS (irritable bowel syndrome)     Peripheral arterial disease      legs   Pneumonia     Right inguinal hernia               Past Surgical History:  Procedure Laterality Date   ABDOMINAL AORTAGRAM N/A 11/20/2013    Procedure: ABDOMINAL EZELLA;  Surgeon: Lonni GORMAN Blade, MD;  Location: [facility] CATH LAB;  Service: Cardiovascular;  Laterality: N/A;   APPENDECTOMY       JOINT REPLACEMENT        Left hip 2018   TOTAL HIP ARTHROPLASTY Right 02/28/2020    Procedure: TOTAL HIP ARTHROPLASTY ANTERIOR APPROACH;  Surgeon: Melodi Lerner, MD;  Location: [facility] ORS;  Service: Orthopedics;  Laterality: Right;    WRIST SURGERY            Social History:  Still smoking and Vaping.    Allergies       Allergies  Allergen Reactions   Penicillamine Rash and Swelling   Clindamycin Other (See Comments)      Caused c. diff   Penicillins Rash             Family History  Problem Relation Age of Onset   Varicose Veins Mother     Clotting disorder Mother     Diabetes Brother     Hypertension Brother     Diabetes Daughter     Hyperlipidemia Daughter     Hypertension Daughter                    Prior to Admission medications   Medication Sig Start Date End Date Taking? Authorizing Provider  alendronate (FOSAMAX) 70 MG tablet Take 70 mg by mouth every 7 (seven) days. 09/03/19     [provider]  ascorbic acid (VITAMIN C) 500 MG tablet Take 500 mg by mouth daily.       [provider]  atenolol  (TENORMIN ) 50 MG tablet Take 50 mg by mouth daily.       [provider]  atorvastatin  (LIPITOR) 40 MG tablet Take 40 mg by mouth daily. 12/04/19     [provider]  Cholecalciferol (VITAMIN D3) 10 MCG (400 UNIT) CAPS Take 400 Units by mouth daily.       [provider]  dicyclomine  (BENTYL ) 20 MG tablet Take 20 mg by mouth 4 (four) times daily - after meals and at bedtime. 12/17/19     [provider]  diltiazem  (TIAZAC )  240 MG 24 hr capsule Take 240 mg by mouth daily. 08/08/19     [provider]  furosemide  (LASIX ) 20 MG tablet Take 20 mg by mouth daily.       [provider]  HYDROcodone -acetaminophen  (NORCO/VICODIN) 5-325 MG tablet Take 1-2 tablets by mouth every 6 (six) hours as needed for moderate pain (pain score 4-6). 02/29/20     Patti Rosina SAUNDERS, PA-C  iron  polysaccharides (NIFEREX) 150 MG capsule Take 150 mg by mouth 2 (two) times daily.       [provider]  loratadine  (CLARITIN ) 10 MG tablet Take 10 mg by mouth daily.       [provider]  methocarbamol  (ROBAXIN ) 500 MG tablet Take 1 tablet (500 mg total) by mouth every 6 (six) hours as needed for muscle spasms. 02/29/20     Patti Rosina SAUNDERS, PA-C  mirtazapine  (REMERON ) 30 MG tablet Take 30 mg by mouth at bedtime.       [provider]  Multiple Vitamin (MULTIVITAMIN WITH MINERALS) TABS tablet Take 1 tablet by mouth daily.       [provider]  ondansetron  (ZOFRAN ) 8 MG tablet Take 8 mg by mouth every 8 (eight) hours as needed for nausea or vomiting.       [provider]  pantoprazole  (PROTONIX ) 40 MG  tablet Take 40 mg by mouth daily.       [provider]  silver sulfADIAZINE (SILVADENE) 1 % cream Apply 1 application topically daily as needed (irritation).       [provider]  traMADol  (ULTRAM ) 50 MG tablet Take 100 mg by mouth every 6 (six) hours as needed for moderate pain.       [provider]      Physical exam: Bilateral dressing noted on the legs and foot.   General: Alert, awake and oriented.  Dermatology: Full thickness ulcer noted at the tip of the right hallux with exposed and erosive changes to the bone. There is some purulent drainage noted. There is localized swelling and redness noted to the level of the IPJ. Pain at the ulcer site. Ulcer measured to be 1.3x1.5x0.6. Thicknening of all 10 toe nails noted. Venous stasis ulcer present on lower legs on both sides. Dressing on the legs was not removed.   Vascular: DP pulses palpable. PT non palpable. Discoloration of both lower legs noted. Pedal hair growth is absent. Capillary refill time is brisk to lesser toes.  Neurology: Protective sensation is intact as tested with 5.07g monofilament. Pain at the ulcer site.  Musculoskeletol: Muscle strength is 5/5.    Labs: WBC down from 9.1 to 8.1   Radiographs: Lytic erosive changes noted to the distal phalanx to suggest osteomyelitis.   EXAM: NONINVASIVE PHYSIOLOGIC VASCULAR STUDY OF BILATERAL LOWER EXTREMITIES   TECHNIQUE: Evaluation of both lower extremities were performed at rest, including calculation of ankle-brachial indices with single level pressure measurements and doppler recording.   COMPARISON:  None available.   FINDINGS: Right ABI:  0.92   Left ABI:  1.03   Right Lower Extremity: Posterior tibial and dorsalis and pedis artery waveforms are monophasic.   Left Lower Extremity: Dorsalis pedis artery waveform is monophasic. Posterior tibial artery could not be assessed due to bandage in place.   0.9-0.99 Borderline PAD    IMPRESSION: Minimally decreased RIGHT ankle-brachial index indicative of borderline peripheral arterial disease. LEFT dorsalis pedis ABI is within normal limits. However, given monophasic waveform seen bilaterally, there may still  be significant underlying peripheral arterial disease. Consider further evaluation with CT angiography abdominal aorta with runoff. \   CT angiography:  RIGHT Lower Extremity   Inflow: Common, internal and external iliac arteries are patent without evidence of aneurysm, dissection, vasculitis or significant stenosis. Coarse, nearly diffuse atherosclerotic calcifications.   Outflow: The common and profundal femoral arteries are patent. The superficial femoral artery is patent proximally with scattered atherosclerotic calcifications with severe focal stenosis just distal to Hunter's canal. Diffuse circumferential atherosclerotic calcific about the otherwise patent popliteal artery.   Runoff: The anterior tibial artery is patent throughout. The peroneal artery is patent to the level of foot. The posterior tibial artery is chronically occluded throughout which.   A: Acute osteomyelitis of the right hallux.  Cellutlis of the right hallux.  Chronic venous insuffiencey with venous ulcers on both legs.    P: patient examined and evaluated. Wound was flushed with saline and applied Aquacell Ag.  I explained her options of partial toe amputation vs 6 to 8 weeks of IV antibiotics. Patient wants to proceed with surgery. Surgery was discussed with patient in layman's term.  Patient to NPO midnight tonight.  Given that its exposed bone and sufficient vascular flow, I think she would heal at the amputation site. Did told her that her smoking could delay the healing and she understand this.  Patient is schedule for surgery on 09/06/24 at 11:00 for partial amputation of the right great toe.  Continue IV antibiotics.  Will follow up with patient while in hospital.

## 2024-09-05 NOTE — Progress Notes (Signed)
 Nurse at bedside,patient alert and oriented to person,place,and situation,confused to time.Patient did c/o pain to right foot,rated a 10,a stabbing,throbbing,tightness type of pain that's constant.Blood pressure was 161/72,Dr Tat notified.Plan of care on going.

## 2024-09-05 NOTE — Progress Notes (Signed)
 Nurse at bedside,patient having concerns about not getting her blood pressure medications this am.Patient informed that nurse had reached out to MD,regarding her blood pressure medications. Plan of care on going.

## 2024-09-06 ENCOUNTER — Encounter (HOSPITAL_COMMUNITY)
Admission: EM | Disposition: A | Discharge status: Home Health | Payer: Self-pay | Source: Home / Self Care | Attending: Internal Medicine

## 2024-09-06 ENCOUNTER — Inpatient Hospital Stay (HOSPITAL_COMMUNITY): Admitting: Certified Registered Nurse Anesthetist

## 2024-09-06 ENCOUNTER — Encounter (HOSPITAL_COMMUNITY): Payer: Self-pay | Admitting: Internal Medicine

## 2024-09-06 DIAGNOSIS — I129 Hypertensive chronic kidney disease with stage 1 through stage 4 chronic kidney disease, or unspecified chronic kidney disease: Secondary | ICD-10-CM | POA: Diagnosis not present

## 2024-09-06 DIAGNOSIS — Z87891 Personal history of nicotine dependence: Secondary | ICD-10-CM

## 2024-09-06 DIAGNOSIS — M869 Osteomyelitis, unspecified: Secondary | ICD-10-CM | POA: Diagnosis not present

## 2024-09-06 DIAGNOSIS — M86171 Other acute osteomyelitis, right ankle and foot: Secondary | ICD-10-CM | POA: Diagnosis not present

## 2024-09-06 DIAGNOSIS — N1831 Chronic kidney disease, stage 3a: Secondary | ICD-10-CM | POA: Diagnosis not present

## 2024-09-06 HISTORY — PX: AMPUTATION TOE: SHX6595

## 2024-09-06 LAB — BASIC METABOLIC PANEL WITH GFR
Anion gap: 13 (ref 5–15)
BUN: 17 mg/dL (ref 8–23)
CO2: 24 mmol/L (ref 22–32)
Calcium: 9 mg/dL (ref 8.9–10.3)
Chloride: 102 mmol/L (ref 98–111)
Creatinine, Ser: 0.83 mg/dL (ref 0.44–1.00)
GFR, Estimated: >60 mL/min (ref >60)
Glucose, Bld: 101 mg/dL — ABNORMAL HIGH (ref 70–99)
Potassium: 3.3 mmol/L — ABNORMAL LOW (ref 3.5–5.1)
Sodium: 139 mmol/L (ref 135–145)

## 2024-09-06 LAB — CBC
HCT: 37.8 % (ref 36.0–46.0)
Hemoglobin: 12 g/dL (ref 12.0–15.0)
MCH: 26.2 pg (ref 26.0–34.0)
MCHC: 31.7 g/dL (ref 30.0–36.0)
MCV: 82.5 fL (ref 80.0–100.0)
Platelets: 297 K/uL (ref 150–400)
RBC: 4.58 MIL/uL (ref 3.87–5.11)
RDW: 14.1 % (ref 11.5–15.5)
WBC: 6.5 K/uL (ref 4.0–10.5)
nRBC: 0 % (ref 0.0–0.2)

## 2024-09-06 LAB — SURGICAL PCR SCREEN
MRSA, PCR: NEGATIVE
Staphylococcus aureus: NEGATIVE

## 2024-09-06 SURGERY — AMPUTATION, TOE
Anesthesia: General | Site: Toe | Laterality: Right

## 2024-09-06 MED ORDER — FENTANYL CITRATE (PF) 100 MCG/2ML IJ SOLN
INTRAMUSCULAR | Status: DC | PRN
  Administered 2024-09-06 (×4): 25 ug via INTRAVENOUS

## 2024-09-06 MED ORDER — VITAMIN C 500 MG PO TABS
500.0000 mg | ORAL_TABLET | Freq: Two times a day (BID) | ORAL | Status: DC
  Administered 2024-09-06 – 2024-09-07 (×2): 500 mg via ORAL
  Filled 2024-09-06 (×2): qty 1

## 2024-09-06 MED ORDER — LIDOCAINE HCL 1 % IJ SOLN
INTRAMUSCULAR | Status: DC | PRN
  Administered 2024-09-06: 10 mL via INTRAMUSCULAR

## 2024-09-06 MED ORDER — FENTANYL CITRATE (PF) 100 MCG/2ML IJ SOLN
INTRAMUSCULAR | Status: AC
  Filled 2024-09-06: qty 2

## 2024-09-06 MED ORDER — ONDANSETRON HCL 4 MG/2ML IJ SOLN: 4.0000 mg | Freq: Once | INTRAMUSCULAR | Status: DC | PRN

## 2024-09-06 MED ORDER — VANCOMYCIN HCL 750 MG/150ML IV SOLN
750.0000 mg | INTRAVENOUS | Status: DC
  Administered 2024-09-06: 750 mg via INTRAVENOUS
  Filled 2024-09-06: qty 150

## 2024-09-06 MED ORDER — OXYCODONE HCL 5 MG PO TABS
5.0000 mg | ORAL_TABLET | ORAL | Status: DC | PRN
  Administered 2024-09-07 (×2): 5 mg via ORAL
  Filled 2024-09-06 (×2): qty 1

## 2024-09-06 MED ORDER — OXYCODONE HCL 5 MG PO TABS: 5.0000 mg | ORAL_TABLET | Freq: Once | ORAL | Status: DC | PRN

## 2024-09-06 MED ORDER — FENTANYL CITRATE (PF) 50 MCG/ML IJ SOSY: 25.0000 ug | PREFILLED_SYRINGE | INTRAMUSCULAR | Status: DC | PRN

## 2024-09-06 MED ORDER — POTASSIUM CHLORIDE CRYS ER 20 MEQ PO TBCR
40.0000 meq | EXTENDED_RELEASE_TABLET | Freq: Once | ORAL | Status: AC
Start: 2024-09-06 — End: 2024-09-06
  Administered 2024-09-06: 40 meq via ORAL
  Filled 2024-09-06: qty 4

## 2024-09-06 MED ORDER — ZINC SULFATE 220 (50 ZN) MG PO CAPS
220.0000 mg | ORAL_CAPSULE | Freq: Every day | ORAL | Status: DC
  Administered 2024-09-06 – 2024-09-07 (×2): 220 mg via ORAL
  Filled 2024-09-06 (×2): qty 1

## 2024-09-06 MED ORDER — LIDOCAINE HCL (PF) 1 % IJ SOLN
INTRAMUSCULAR | Status: AC
  Filled 2024-09-06: qty 30

## 2024-09-06 MED ORDER — BUPIVACAINE HCL (PF) 0.5 % IJ SOLN
INTRAMUSCULAR | Status: AC
  Filled 2024-09-06: qty 30

## 2024-09-06 MED ORDER — CHLORHEXIDINE GLUCONATE 0.12 % MT SOLN
15.0000 mL | Freq: Once | OROMUCOSAL | Status: AC
  Administered 2024-09-06: 15 mL via OROMUCOSAL

## 2024-09-06 MED ORDER — PROPOFOL 500 MG/50ML IV EMUL
INTRAVENOUS | Status: DC | PRN
  Administered 2024-09-06: 50 ug/kg/min via INTRAVENOUS
  Administered 2024-09-06 (×2): 40 mg via INTRAVENOUS

## 2024-09-06 MED ORDER — ENSURE PLUS HIGH PROTEIN PO LIQD
237.0000 mL | Freq: Two times a day (BID) | ORAL | Status: DC
  Administered 2024-09-07: 237 mL via ORAL

## 2024-09-06 MED ORDER — ORAL CARE MOUTH RINSE: 15.0000 mL | Freq: Once | OROMUCOSAL | Status: AC

## 2024-09-06 MED ORDER — OXYCODONE HCL 5 MG/5ML PO SOLN: 5.0000 mg | Freq: Once | ORAL | Status: DC | PRN

## 2024-09-06 MED ORDER — PROPOFOL 10 MG/ML IV BOLUS
INTRAVENOUS | Status: AC
  Filled 2024-09-06: qty 20

## 2024-09-06 MED ORDER — 0.9 % SODIUM CHLORIDE (POUR BTL) OPTIME
TOPICAL | Status: DC | PRN
  Administered 2024-09-06: 1000 mL

## 2024-09-06 MED ORDER — LIDOCAINE 2% (20 MG/ML) 5 ML SYRINGE
INTRAMUSCULAR | Status: AC
  Filled 2024-09-06: qty 5

## 2024-09-06 MED ORDER — LIDOCAINE 2% (20 MG/ML) 5 ML SYRINGE
INTRAMUSCULAR | Status: DC | PRN
  Administered 2024-09-06: 40 mg via INTRAVENOUS

## 2024-09-06 MED ORDER — LACTATED RINGERS IV SOLN: INTRAVENOUS | Status: DC

## 2024-09-06 MED ORDER — DICYCLOMINE HCL 10 MG PO CAPS
20.0000 mg | ORAL_CAPSULE | Freq: Three times a day (TID) | ORAL | Status: DC
  Administered 2024-09-06 – 2024-09-07 (×5): 20 mg via ORAL
  Filled 2024-09-06 (×5): qty 2

## 2024-09-06 SURGICAL SUPPLY — 31 items
BANDAGE ESMARK 4X12 BL STRL LF (DISPOSABLE) ×1 IMPLANT
BLADE SURG 15 STRL LF DISP TIS (BLADE) ×1 IMPLANT
BNDG ELASTIC 3X5.8 VLCR NS LF (GAUZE/BANDAGES/DRESSINGS) ×1 IMPLANT
BNDG GAUZE DERMACEA FLUFF 4 (GAUZE/BANDAGES/DRESSINGS) ×1 IMPLANT
BNDG GAUZE ROLL STR 2.25X3YD (GAUZE/BANDAGES/DRESSINGS) IMPLANT
CLOTH BEACON ORANGE TIMEOUT ST (SAFETY) ×1 IMPLANT
COVER LIGHT HANDLE STERIS (MISCELLANEOUS) ×1 IMPLANT
CUFF TOURN SGL QUICK 18X4 (TOURNIQUET CUFF) ×1 IMPLANT
DRSG ADAPTIC 3X8 NADH LF (GAUZE/BANDAGES/DRESSINGS) ×1 IMPLANT
ELECTRODE REM PT RTRN 9FT ADLT (ELECTROSURGICAL) ×1 IMPLANT
GAUZE 4X4 16PLY ~~LOC~~+RFID DBL (SPONGE) ×1 IMPLANT
GAUZE SPONGE 4X4 12PLY STRL (GAUZE/BANDAGES/DRESSINGS) ×2 IMPLANT
GAUZE STRETCH 2X75IN STRL (MISCELLANEOUS) ×1 IMPLANT
GLOVE BIO SURGEON STRL SZ7.5 (GLOVE) ×1 IMPLANT
GLOVE BIOGEL PI IND STRL 7.0 (GLOVE) ×2 IMPLANT
GLOVE BIOGEL PI IND STRL 7.5 (GLOVE) ×1 IMPLANT
GLOVE ECLIPSE 7.0 STRL STRAW (GLOVE) ×1 IMPLANT
GOWN STRL REUS W/ TWL LRG LVL3 (GOWN DISPOSABLE) ×1 IMPLANT
GOWN STRL REUS W/TWL LRG LVL3 (GOWN DISPOSABLE) ×1 IMPLANT
KIT TURNOVER KIT A (KITS) ×1 IMPLANT
MANIFOLD NEPTUNE II (INSTRUMENTS) ×1 IMPLANT
NDL HYPO 25X1 1.5 SAFETY (NEEDLE) ×2 IMPLANT
NEEDLE HYPO 25X1 1.5 SAFETY (NEEDLE) ×2 IMPLANT
NS IRRIG 1000ML POUR BTL (IV SOLUTION) ×1 IMPLANT
PACK BASIC LIMB (CUSTOM PROCEDURE TRAY) ×1 IMPLANT
PAD ARMBOARD POSITIONER FOAM (MISCELLANEOUS) ×1 IMPLANT
POSITIONER HEAD 8X9X4 ADT (SOFTGOODS) ×1 IMPLANT
SET BASIN LINEN APH (SET/KITS/TRAYS/PACK) ×1 IMPLANT
SUT 3-0 BLK 1X30 PSL (SUTURE) IMPLANT
SUT PROLENE 3 0 PS 1 (SUTURE) ×1 IMPLANT
SYR CONTROL 10ML LL (SYRINGE) ×2 IMPLANT

## 2024-09-06 NOTE — Brief Op Note (Signed)
 09/06/2024  11:19 AM  PATIENT:  Stacy Case  73 y.o. female  PRE-OPERATIVE DIAGNOSIS:  Osteomyelitis of Right Great Toe  POST-OPERATIVE DIAGNOSIS:  Osteomyelitis of Right Great Toe  PROCEDURE:  Partial amputation of the right great toe.   SURGEON:  Surgeons and Role:    * Tobie Buckles, DPM - Primary  PHYSICIAN ASSISTANT:   ASSISTANTS: none   ANESTHESIA:   Local and MAC  EBL:  None  BLOOD ADMINISTERED:none  DRAINS: none   LOCAL MEDICATIONS USED:  MARCAINE    , LIDOCAINE  , and Amount: 10 ml  SPECIMEN:  Source of Specimen:  Right distal phalanx great toe   DISPOSITION OF SPECIMEN:  PATHOLOGY  COUNTS:  YES  TOURNIQUET:  Not used.   DICTATION: Scientist, research (medical) Dictation  PLAN OF CARE: Admit to inpatient   PATIENT DISPOSITION:  PACU - hemodynamically stable.   Delay start of Pharmacological VTE agent (>24hrs) due to surgical blood loss or risk of bleeding: yes

## 2024-09-06 NOTE — Consult Note (Signed)
 Pharmacy Antibiotic Note  Stacy Case is a 73 y.o. female admitted on 09/04/2024 with increasing pain and swelling in the right foot and great toe.  Pharmacy has been consulted for Vancomycin  and Cefepime dosing.  Patient currently afebrile with normal wbc. She is s/p toe amputation today 10/22.   Plan: Adjust to Vancomycin  750mg  IV q24 hours eAUC of 422, based on scr of 0.8 Cefepime 2g IV Q12 hours  Height: 5' 2 (157.5 cm) Weight: 54.7 kg (120 lb 11.2 oz) IBW/kg (Calculated) : 50.1  Temp (24hrs), Avg:98.3 F (36.8 C), Min:97.8 F (36.6 C), Max:99.1 F (37.3 C)  Recent Labs  Lab 09/04/24 1336 09/05/24 0446 09/06/24 0415  WBC 9.1 8.1 6.5  CREATININE 1.26* 1.07* 0.83  LATICACIDVEN 2.0*  --   --     Estimated Creatinine Clearance: 47 mL/min (by C-G formula based on SCr of 0.83 mg/dL).    Allergies  Allergen Reactions   Penicillamine Rash and Swelling   Clindamycin Other (See Comments)    Caused c. diff   Penicillins Rash    Antimicrobials this admission: Vancomycin  10/20 >>  Cefepime 10/20 >>   Microbiology results: 10/20 BCx: ngtd 10/22 wound: sent   Thank you for allowing pharmacy to be a part of this patient's care.  Dempsey Blush PharmD., BCPS Clinical Pharmacist 09/06/2024 3:26 PM

## 2024-09-06 NOTE — Progress Notes (Signed)
 Pre-Operative note.   Patient is 73 year old female seen bedside. Denies eating or drinking anything today. Toe still hurts but pain is controlled. Admitted to hospital for acute osteomyelitis of the right great toe.    Past Medical History:  Diagnosis Date   Anxiety     Arthritis     Cellulitis of ankle     COPD (chronic obstructive pulmonary disease) (HCC)     Cystocele     Depression     GERD (gastroesophageal reflux disease)     Headache      hx of migraines   Hyperlipidemia     Hypertension     IBS (irritable bowel syndrome)     Peripheral arterial disease      legs   Pneumonia     Right inguinal hernia                    Past Surgical History:  Procedure Laterality Date   ABDOMINAL AORTAGRAM N/A 11/20/2013    Procedure: ABDOMINAL EZELLA;  Surgeon: Lonni GORMAN Blade, MD;  Location: [facility] CATH LAB;  Service: Cardiovascular;  Laterality: N/A;   APPENDECTOMY       JOINT REPLACEMENT        Left hip 2018   TOTAL HIP ARTHROPLASTY Right 02/28/2020    Procedure: TOTAL HIP ARTHROPLASTY ANTERIOR APPROACH;  Surgeon: Melodi Lerner, MD;  Location: [facility] ORS;  Service: Orthopedics;  Laterality: Right;    WRIST SURGERY             Social History:  Still smoking and Vaping.    Allergies           Allergies  Allergen Reactions   Penicillamine Rash and Swelling   Clindamycin Other (See Comments)      Caused c. diff   Penicillins Rash                 Family History  Problem Relation Age of Onset   Varicose Veins Mother     Clotting disorder Mother     Diabetes Brother     Hypertension Brother     Diabetes Daughter     Hyperlipidemia Daughter     Hypertension Daughter             Latest Reference Range & Units 09/06/24 04:15  Basic metabolic panel with GFR  Rpt !  Sodium 135 - 145 mmol/L 139  Potassium 3.5 - 5.1 mmol/L 3.3 (L)  Chloride 98 - 111 mmol/L 102  CO2 22 - 32 mmol/L 24  Glucose 70 - 99 mg/dL 898 (H)  BUN 8 - 23 mg/dL 17  Creatinine 9.55 -  1.00 mg/dL 9.16  Calcium  8.9 - 10.3 mg/dL 9.0  Anion gap 5 - 15  13  GFR, Estimated >60 mL/min >60  WBC 4.0 - 10.5 K/uL 6.5  RBC 3.87 - 5.11 MIL/uL 4.58  Hemoglobin 12.0 - 15.0 g/dL 87.9  HCT 63.9 - 53.9 % 37.8  MCV 80.0 - 100.0 fL 82.5  MCH 26.0 - 34.0 pg 26.2  MCHC 30.0 - 36.0 g/dL 68.2  RDW 88.4 - 84.4 % 14.1  Platelets 150 - 400 K/uL 297  nRBC 0.0 - 0.2 % 0.0   General: Alert, awake and oriented.  Dermatology: Full thickness ulcer noted at the tip of the right hallux with exposed and erosive changes to the bone. There is some purulent drainage noted. There is localized swelling and redness noted to the level of the IPJ. Pain at  the ulcer site. Ulcer measured to be 1.3x1.5x0.6. Thicknening of all 10 toe nails noted. Venous stasis ulcer present on lower legs on both sides. Dressing on the legs was not removed.   Vascular: DP pulses palpable. PT non palpable. Discoloration of both lower legs noted. Pedal hair growth is absent. Capillary refill time is brisk to lesser toes.  Neurology: Protective sensation is intact as tested with 5.07g monofilament. Pain at the ulcer site.  Musculoskeletol: Muscle strength is 5/5.      Labs: WBC down from 9.1 to 8.1    Radiographs: Lytic erosive changes noted to the distal phalanx to suggest osteomyelitis.    Pre-op Diagnosis: Acute osteomyelitis of the right hallux.   Planned procedure: partial amputation of the right hallux.   Plan: Partial amputation of the right hallux. Procedure was explained in layman's term. Patient understand this.All the alternative treatment options including 6-8 week of IV antibiotics and wound care was discussed with patient.  No guarantees were given to patient for outcome of the procedure. She understands that she may need additional surgery. Risk and benefits discussed with patient. Informed consent was signed and witnessed.

## 2024-09-06 NOTE — Progress Notes (Signed)
 PROGRESS NOTE    Stacy Case  FMW:[id number] DOB: 1951/04/17 DOA: 09/04/2024 PCP: [name], MD   Brief Narrative:    73 year old female with a history of GERD, hypertension, hyperlipidemia venous insufficiency, chronic pain syndrome with opioid dependence, iron  deficiency anemia presenting with increasing pain and swelling in the right foot and great toe.  Patient has undergone partial amputation of the right great toe on 10/22.  Assessment & Plan:   Principal Problem:   Acute osteomyelitis of toe (HCC) Active Problems:   Lymphedema of both lower extremities   Essential hypertension   Mixed hyperlipidemia   Chronic kidney disease, stage 3a (HCC)   Lactic acidosis   Osteomyelitis of great toe (HCC)  Assessment and Plan:  Cellulitis/acute osteomyelitis right great toe status post right great toe partial amputation 10/22 -Continue vancomycin  and cefepime - Judicious opioids - Appreciate further podiatry recommendations   Chronic pain syndrome/opioid dependence - PDMP reviewed - Tramadol  50 mg, #240, last refill 08/28/2024   Essential hypertension - Holding atenolol  and diltiazem  temporarily secondary to soft blood pressure   CKD stage IIIa - Baseline creatinine 0.9-1.2 - Follow BMP   Mixed hyperlipidemia - Continue statin   Venous stasis dermatitis - Patient is on furosemide  chronically - This will be held temporarily   GERD - Continue pantoprazole   Mild hypokalemia -Replete and reevaluate in a.m.   IBS -start Bentyl     DVT prophylaxis: Lovenox Code Status: Full Family Communication: 2 daughters at bedside 10/22 Disposition Plan:  Status is: Inpatient Remains inpatient appropriate because: Need for IV medications and inpatient procedure.  Consultants:  Podiatry  Procedures:  Right great toe partial amputation 10/22  Antimicrobials:  Anti-infectives (From admission, onward)    Start     Dose/Rate Route Frequency Ordered Stop   09/06/24  1800  [MAR Hold]  vancomycin  (VANCOREADY) IVPB 1250 mg/250 mL        (MAR Hold since Wed 09/06/2024 at 0954.Hold Reason: Transfer to a Procedural area)   1,250 mg 166.7 mL/hr over 90 Minutes Intravenous Every 48 hours 09/04/24 1826     09/04/24 2200  [MAR Hold]  ceFEPIme (MAXIPIME) 2 g in sodium chloride  0.9 % 100 mL IVPB        (MAR Hold since Wed 09/06/2024 at 0954.Hold Reason: Transfer to a Procedural area)   2 g 200 mL/hr over 30 Minutes Intravenous Every 12 hours 09/04/24 1813     09/04/24 1630  ceFEPIme (MAXIPIME) 2 g in sodium chloride  0.9 % 100 mL IVPB        2 g 200 mL/hr over 30 Minutes Intravenous  Once 09/04/24 1619 09/04/24 1808   09/04/24 1600  vancomycin  (VANCOCIN ) IVPB 1000 mg/200 mL premix        1,000 mg 200 mL/hr over 60 Minutes Intravenous  Once 09/04/24 1558 09/04/24 1838       Subjective: Patient seen and evaluated today with no new acute complaints or concerns. No acute concerns or events noted overnight.  Objective: Vitals:   09/05/24 2000 09/06/24 0938 09/06/24 1009 09/06/24 1120  BP: 118/85 (!) 174/91 (!) 140/95 (!) 116/96  Pulse: 76 81 76 77  Resp: 20 19 18  (!) 22  Temp: 99.1 F (37.3 C) 98.1 F (36.7 C) 98.4 F (36.9 C) 97.8 F (36.6 C)  TempSrc: Oral Oral Oral   SpO2: 93% 96% 100% 98%  Weight:      Height:        Intake/Output Summary (Last 24 hours) at 09/06/2024 1133 Last  data filed at 09/06/2024 1110 Gross per 24 hour  Intake 500 ml  Output --  Net 500 ml   Filed Weights   09/04/24 1306 09/04/24 1757  Weight: 55.2 kg 54.7 kg    Examination:  General exam: Appears calm and comfortable  Respiratory system: Clear to auscultation. Respiratory effort normal. Cardiovascular system: S1 & S2 heard, RRR.  Gastrointestinal system: Abdomen is soft Central nervous system: Alert and awake Extremities: Lower extremities in dressings C/D/I Skin: No significant lesions noted Psychiatry: Flat affect.    Data Reviewed: I have personally  reviewed following labs and imaging studies  CBC: Recent Labs  Lab 09/04/24 1336 09/05/24 0446 09/06/24 0415  WBC 9.1 8.1 6.5  NEUTROABS 6.2  --   --   HGB 11.3* 11.8* 12.0  HCT 36.5 38.2 37.8  MCV 85.1 84.3 82.5  PLT 357 313 297   Basic Metabolic Panel: Recent Labs  Lab 09/04/24 1336 09/05/24 0446 09/06/24 0415  NA 142 140 139  K 3.9 4.0 3.3*  CL 104 105 102  CO2 24 25 24   GLUCOSE 87 112* 101*  BUN 21 20 17   CREATININE 1.26* 1.07* 0.83  CALCIUM  8.9 9.0 9.0   GFR: Estimated Creatinine Clearance: 47 mL/min (by C-G formula based on SCr of 0.83 mg/dL). Liver Function Tests: Recent Labs  Lab 09/04/24 1336  AST 30  ALT 19  ALKPHOS 104  BILITOT 0.2  PROT 7.6  ALBUMIN 3.9   No results for input(s): LIPASE, AMYLASE in the last 168 hours. No results for input(s): AMMONIA in the last 168 hours. Coagulation Profile: No results for input(s): INR, PROTIME in the last 168 hours. Cardiac Enzymes: No results for input(s): CKTOTAL, CKMB, CKMBINDEX, TROPONINI in the last 168 hours. BNP (last 3 results) No results for input(s): PROBNP in the last 8760 hours. HbA1C: No results for input(s): HGBA1C in the last 72 hours. CBG: No results for input(s): GLUCAP in the last 168 hours. Lipid Profile: No results for input(s): CHOL, HDL, LDLCALC, TRIG, CHOLHDL, LDLDIRECT in the last 72 hours. Thyroid  Function Tests: No results for input(s): TSH, T4TOTAL, FREET4, T3FREE, THYROIDAB in the last 72 hours. Anemia Panel: No results for input(s): VITAMINB12, FOLATE, FERRITIN, TIBC, IRON , RETICCTPCT in the last 72 hours. Sepsis Labs: Recent Labs  Lab 09/04/24 1336  LATICACIDVEN 2.0*    Recent Results (from the past 240 hours)  Culture, blood (single)     Status: None (Preliminary result)   Collection Time: 09/04/24  1:36 PM   Specimen: BLOOD  Result Value Ref Range Status   Specimen Description BLOOD BLOOD RIGHT ARM  Final    Special Requests   Final    BOTTLES DRAWN AEROBIC AND ANAEROBIC Blood Culture adequate volume   Culture   Final    NO GROWTH 2 DAYS Performed at [facility], [address], [place], [place] [address]    Report Status PENDING  Incomplete  Culture, blood (single)     Status: None (Preliminary result)   Collection Time: 09/04/24  5:33 PM   Specimen: BLOOD  Result Value Ref Range Status   Specimen Description BLOOD BLOOD RIGHT HAND  Final   Special Requests   Final    AEROBIC BOTTLE ONLY Blood Culture results may not be optimal due to an inadequate volume of blood received in culture bottles   Culture   Final    NO GROWTH 2 DAYS Performed at [facility], [address], [place], [place] [address]    Report Status  PENDING  Incomplete  Surgical PCR screen     Status: None   Collection Time: 09/06/24  1:30 AM   Specimen: Nasal Mucosa; Nasal Swab  Result Value Ref Range Status   MRSA, PCR NEGATIVE NEGATIVE Final   Staphylococcus aureus NEGATIVE NEGATIVE Final    Comment: (NOTE) The Xpert SA Assay (FDA approved for NASAL specimens in patients 97 years of age and older), is one component of a comprehensive surveillance program. It is not intended to diagnose infection nor to guide or monitor treatment. Performed at [facility], [address] [place], [place] [address]          Radiology Studies: CT ANGIO AO+BIFEM W & OR WO CONTRAST Result Date: 09/05/2024 CLINICAL DATA:  [age] year right first toe ulceration. EXAM: CT ANGIOGRAPHY OF ABDOMINAL AORTA WITH ILIOFEMORAL RUNOFF TECHNIQUE: Multidetector CT imaging of the abdomen, pelvis and lower extremities was performed using the standard protocol during bolus administration of intravenous contrast. Multiplanar CT image reconstructions and MIPs were obtained to evaluate the vascular anatomy. RADIATION DOSE REDUCTION: This exam was performed according to the departmental dose-optimization program which includes automated exposure  control, adjustment of the mA and/or kV according to patient size and/or use of iterative reconstruction technique. CONTRAST:  125mL OMNIPAQUE IOHEXOL 350 MG/ML SOLN COMPARISON:  None Available. FINDINGS: VASCULAR Aorta: Normal caliber with circumferential fibrofatty and calcific atherosclerotic changes with diffuse luminal irregularity most prominent about the infrarenal portion. Celiac: Patent without evidence of aneurysm, dissection, vasculitis or significant stenosis. SMA: Patent without evidence of aneurysm, dissection, vasculitis or significant stenosis. Renals: Moderate to severe proximal ostial stenoses secondary to calcified atherosclerotic plaques of the single bilateral renal arteries. IMA: Patent without evidence of aneurysm, dissection, vasculitis or significant stenosis. RIGHT Lower Extremity Inflow: Common, internal and external iliac arteries are patent without evidence of aneurysm, dissection, vasculitis or significant stenosis. Coarse, nearly diffuse atherosclerotic calcifications. Outflow: The common and profundal femoral arteries are patent. The superficial femoral artery is patent proximally with scattered atherosclerotic calcifications with severe focal stenosis just distal to Hunter's canal. Diffuse circumferential atherosclerotic calcific about the otherwise patent popliteal artery. Runoff: The anterior tibial artery is patent throughout. The peroneal artery is patent to the level of foot. The posterior tibial artery is chronically occluded throughout which. LEFT Lower Extremity Inflow: Common, internal and external iliac arteries are patent without evidence of aneurysm, dissection, vasculitis or significant stenosis. Coarse, nearly diffuse atherosclerotic calcifications. Outflow: The common femoral and profundal femoral arteries are patent. Superficial femoral artery is patent proximally with severe focal stenosis just inferior to Hunter's canal. Additionally, there is a severe focal  stenosis in the P1 segment of the popliteal artery. There is distal reconstitution however the remaining P3 and P3 segments of the popliteal are diminutive with scattered atherosclerotic calcifications. Runoff: The anterior tibial and peroneal arteries are patent to the level of the foot. 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 Veins: No obvious venous abnormality within the limitations of this arterial phase study. Review of the MIP images confirms the above findings. NON-VASCULAR Lower chest: Diffuse centrilobular emphysema. Hepatobiliary: There is a simple cyst in the right lobe of the liver measuring up to 2.0 cm. The gallbladder is present and relatively decompressed. There is severe common bile duct dilation measuring up to approximately 1.8 cm in maximum short axis diameter. Mild central intrahepatic biliary ductal dilation. Pancreas: Unremarkable. No pancreatic ductal dilatation or surrounding inflammatory changes. Spleen: Normal in size without focal abnormality. Adrenals/Urinary Tract: Adrenal glands are unremarkable.  Hyperattenuating (mean Hounsfield units 83) mass in the left inferior pole measuring up to 1.3 cm. Kidneys are otherwise within normal limits. No hydronephrosis. Bladder is unremarkable, partially obscured by streak artifact in the pelvis. Stomach/Bowel: Stomach is within normal limits. Appendix is not definitively identified. No evidence of bowel wall thickening, distention, or inflammatory changes. Lymphatic: No abdominopelvic lymphadenopathy. Reproductive: Obscured by streak artifact, there is suggestion of postsurgical changes after hysterectomy Other: Small right inguinal hernia containing fat small bowel without complicating features. No ascites. Musculoskeletal: Status post bilateral total hip arthroplasties without complicating features. No acute osseous abnormality. IMPRESSION: VASCULAR 1. Right lower extremity: Patent inflow. Severe focal stenosis about the distal  superficial femoral artery. 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 2. Left lower extremity: Patent inflow. Severe focal stenosis about the distal superficial femoral and proximal popliteal arteries. 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 3. Moderate to severe ostial stenoses about the single bilateral renal arteries secondary to calcified atherosclerotic plaque. 4.  Aortic Atherosclerosis (ICD10-I70.0). NON-VASCULAR 1. Hyperattenuating 1.3 cm left inferior pole renal mass. Recommend MRI without and with contrast for further characterization. 2. Indeterminate extrahepatic biliary ductal dilation. Recommend correlation with hepatic function/bilirubin and if further evaluation is warranted, consider MRCP. 3. Incidentally noted pulmonary emphysema (ICD10-J43.9). Ester Sides, MD [facility] Radiology Electronically Signed   By: Ester Sides M.D.   On: 09/05/2024 15:38   US  ARTERIAL ABI (SCREENING LOWER EXTREMITY) Result Date: 09/05/2024 CLINICAL DATA:  Total necrosis for 1 week. Former smoker. Hypertension. Hyperlipidemia. EXAM: NONINVASIVE PHYSIOLOGIC VASCULAR STUDY OF BILATERAL LOWER EXTREMITIES TECHNIQUE: Evaluation of both lower extremities were performed at rest, including calculation of ankle-brachial indices with single level pressure measurements and doppler recording. COMPARISON:  None available. FINDINGS: Right ABI:  0.92 Left ABI:  1.03 Right Lower Extremity: Posterior tibial and dorsalis and pedis artery waveforms are monophasic. Left Lower Extremity: Dorsalis pedis artery waveform is monophasic. Posterior tibial artery could not be assessed due to bandage in place. 0.9-0.99 Borderline PAD IMPRESSION: Minimally decreased RIGHT ankle-brachial index indicative of borderline peripheral arterial disease. LEFT dorsalis pedis ABI is within normal limits. However, given  monophasic waveform seen bilaterally, there may still be significant underlying peripheral arterial disease. Consider further evaluation with CT angiography abdominal aorta with runoff. Electronically Signed   By: Aliene Lloyd M.D.   On: 09/05/2024 07:45   DG Toe Great Right Result Date: 09/04/2024 CLINICAL DATA:  Open wound involving the great toe. EXAM: RIGHT GREAT TOE COMPARISON:  None Available. FINDINGS: Open wound noted along the distal aspect of the great toe with underlying destructive bony changes consistent with osteomyelitis. No findings suspicious for septic arthritis. IMPRESSION: Open wound along the distal aspect of the great toe with underlying destructive bony changes consistent with osteomyelitis. Electronically Signed   By: MYRTIS Stammer M.D.   On: 09/04/2024 14:43        Scheduled Meds:  [MAR Hold] ascorbic acid  500 mg Oral Daily   [MAR Hold] atorvastatin   40 mg Oral Daily   [MAR Hold] cholecalciferol  400 Units Oral Daily   [MAR Hold] dicyclomine   20 mg Oral TID AC & HS   [MAR Hold] enoxaparin (LOVENOX) injection  40 mg Subcutaneous Q24H   [MAR Hold] mirtazapine   30 mg Oral QHS   [MAR Hold] multivitamin with minerals  1 tablet Oral Daily   [MAR Hold] pantoprazole   40 mg Oral Daily   Continuous  Infusions:  [MAR Hold] ceFEPime (MAXIPIME) IV 2 g (09/06/24 1020)   lactated ringers  10 mL/hr at 09/06/24 1039   [MAR Hold] vancomycin        LOS: 2 days    Time spent: 55 minutes    Stacy Huish D Maree, DO [facility]  If 7PM-7AM, please contact night-coverage [email or web address] 09/06/2024, 11:33 AM

## 2024-09-06 NOTE — Anesthesia Preprocedure Evaluation (Signed)
 Anesthesia Evaluation  Patient identified by MRN, date of birth, ID band Patient awake    Reviewed: Allergy & Precautions, H&P , NPO status , Patient's Chart, lab work & pertinent test results, reviewed documented beta blocker date and time   Airway Mallampati: II  TM Distance: >3 FB Neck ROM: full    Dental no notable dental hx.    Pulmonary pneumonia, COPD, former smoker   Pulmonary exam normal breath sounds clear to auscultation       Cardiovascular Exercise Tolerance: Good hypertension, + Peripheral Vascular Disease   Rhythm:regular Rate:Normal     Neuro/Psych  Headaches PSYCHIATRIC DISORDERS Anxiety Depression       GI/Hepatic Neg liver ROS,GERD  ,,  Endo/Other  negative endocrine ROS    Renal/GU Renal disease  negative genitourinary   Musculoskeletal   Abdominal   Peds  Hematology  (+) Blood dyscrasia, anemia   Anesthesia Other Findings   Reproductive/Obstetrics negative OB ROS                              Anesthesia Physical Anesthesia Plan  ASA: 3  Anesthesia Plan: General   Post-op Pain Management:    Induction:   PONV Risk Score and Plan: Propofol  infusion  Airway Management Planned:   Additional Equipment:   Intra-op Plan:   Post-operative Plan:   Informed Consent: I have reviewed the patients History and Physical, chart, labs and discussed the procedure including the risks, benefits and alternatives for the proposed anesthesia with the patient or authorized representative who has indicated his/her understanding and acceptance.     Dental Advisory Given  Plan Discussed with: CRNA  Anesthesia Plan Comments:         Anesthesia Quick Evaluation

## 2024-09-06 NOTE — Transfer of Care (Signed)
 Immediate Anesthesia Transfer of Care Note  Patient: Stacy Case  Procedure(s) Performed: AMPUTATION, TOE (Right: Toe)  Patient Location: PACU  Anesthesia Type:General  Level of Consciousness: awake, alert , and oriented  Airway & Oxygen  Therapy: Patient Spontanous Breathing  Post-op Assessment: Report given to RN and Post -op Vital signs reviewed and stable  Post vital signs: Reviewed and stable  Last Vitals:  Vitals Value Taken Time  BP 116/96   Temp 97.8   Pulse 79   Resp 24   SpO2 97%     Last Pain:  Vitals:   09/06/24 1009  TempSrc: Oral  PainSc: 0-No pain      Patients Stated Pain Goal: 2 (09/06/24 1009)  Complications: No notable events documented.

## 2024-09-06 NOTE — Progress Notes (Signed)
 Nurse at bedside,patient alert and oriented times four. Blood pressure was 174/91,heart rate 81,Dr Pratik Colgate. Plan of care on going.

## 2024-09-06 NOTE — Op Note (Signed)
 09/06/2024  11:19 AM  PATIENT:  Stacy Case  73 y.o. female  PRE-OPERATIVE DIAGNOSIS:  Osteomyelitis of Right Great Toe  POST-OPERATIVE DIAGNOSIS:  Osteomyelitis of Right Great Toe  PROCEDURE:  Partial amputation of the right great toe.   SURGEON:  Surgeons and Role:    * Tobie Buckles, DPM - Primary  PHYSICIAN ASSISTANT:   ASSISTANTS: none   ANESTHESIA:   Local and MAC  EBL:  None  BLOOD ADMINISTERED:none  LOCAL MEDICATIONS USED:  MARCAINE    , LIDOCAINE  , and Amount: 10 ml  SPECIMEN:  Source of Specimen:  Right distal phalanx great toe   DISPOSITION OF SPECIMEN:  PATHOLOGY  MATERIALS: 3-0 Nylon.   TOURNIQUET:  Not used.   DICTATION: Scientist, research (medical) Dictation  PLAN OF CARE: Admit to inpatient   PATIENT DISPOSITION:  PACU - hemodynamically stable.   Delay start of Pharmacological VTE agent (>24hrs) due to surgical blood loss or risk of bleeding: yes   Patient was brought into the operating room laid supine on the operating table. Following IV sedation, a local block was achieved using 10 cc of mixture of 1% plain lidocaine  with 0.5% marcaine . The foot was the prepped, scrubbed and draped in aseptic manner. An esmarch band was used at tourniquet on the surgical site.    Attention was directed towards the right hallux. A full thickness ulceration noted at the distal plantar tip of the right hallux with necrosis of the bone noted. A fish mouth incision was made at the IPJ of the right hallux using 15 blade. Full thickness incision was made dorsally and plantarly. Care was made to preserve the plantar flap. The right great toe was disarticulated at the IPJ. The distal phalanx along with soft tissue were dissected and removed. Specimen was sent to Pathology. Wound was irrigated. No purulent drainage noted. Some nonviable tissue from the plantar flap was removed. Wound cultures obtained. The skin was closed using 3-0 Nylon. Dry sterile dressing applied. Esmarch band was removed.  Patient was transferred to PACU with VSS.   Will be transferred back to the floor.

## 2024-09-06 NOTE — Progress Notes (Signed)
 Initial Nutrition Assessment  DOCUMENTATION CODES:   Not applicable  INTERVENTION:   -Liberalize diet to regular for widest variety of meal selections -MVI with minerals daily -500 mg vitamin C BID -220 mg zinc sulfate daily x 14 days -Ensure Plus High Protein po BID, each supplement provides 350 kcal and 20 grams of protein   NUTRITION DIAGNOSIS:   Increased nutrient needs related to post-op healing as evidenced by estimated needs.  GOAL:   Patient will meet greater than or equal to 90% of their needs  MONITOR:   PO intake, Supplement acceptance  REASON FOR ASSESSMENT:   Malnutrition Screening Tool    ASSESSMENT:   73 year old female with a history of GERD, hypertension, hyperlipidemia venous insufficiency, chronic pain syndrome with opioid dependence, iron  deficiency anemia presenting with increasing pain and swelling in the right foot and great toe.  Patient admitted with osteomyelitis of right great toe.   10/22- s/p Partial amputation of the right great toe.   Reviewed I/O's: +540 ml x 24 hours and +1.1 L since admission  Patient unavailable at time of visit. Attempted to speak with patient via call to hospital room phone, however, unable to reach. RD unable to obtain further nutrition-related history or complete nutrition-focused physical exam at this time.    Per H&P, she is followed as an outpatient by podiatry (Dr Tobie) for callous on right great toe. She was being managed by trimming and Unna boot; she started noting increasing pain on 09/03/24. She saw her podiatrist that day, who referred her to ED.   Case discussed in interdisciplinary rounds. Plan for foot surgery today.   She was advanced to a heart healthy diet post-operatively. Noted meal completions 25-75%.   Weight has been stable over the past month.   Medications reviewed and include vitamin C, vitamin D3, and remeron .   Labs reviewed: K: 3.3.    Diet Order:   Diet Order              Diet Heart Fluid consistency: Thin  Diet effective now                   EDUCATION NEEDS:   No education needs have been identified at this time  Skin:  Skin Assessment: Skin Integrity Issues: Skin Integrity Issues:: Incisions Incisions: rt foot  Last BM:  09/04/24  Height:   Ht Readings from Last 1 Encounters:  09/04/24 5' 2 (1.575 m)    Weight:   Wt Readings from Last 1 Encounters:  09/04/24 54.7 kg    Ideal Body Weight:  50 kg  BMI:  Body mass index is 22.08 kg/m.  Estimated Nutritional Needs:   Kcal:  1650-1850  Protein:  85-100 grams  Fluid:  1.6-1.8 L    Margery ORN, RD, LDN, CDCES Registered Dietitian III Certified Diabetes Care and Education Specialist If unable to reach this RD, please use RD Inpatient group chat on secure chat between hours of 8am-4 pm daily

## 2024-09-07 ENCOUNTER — Encounter (HOSPITAL_COMMUNITY): Payer: Self-pay | Admitting: Podiatry

## 2024-09-07 DIAGNOSIS — M86171 Other acute osteomyelitis, right ankle and foot: Secondary | ICD-10-CM | POA: Diagnosis not present

## 2024-09-07 LAB — BASIC METABOLIC PANEL WITH GFR
Anion gap: 12 (ref 5–15)
BUN: 19 mg/dL (ref 8–23)
CO2: 25 mmol/L (ref 22–32)
Calcium: 9.4 mg/dL (ref 8.9–10.3)
Chloride: 103 mmol/L (ref 98–111)
Creatinine, Ser: 0.89 mg/dL (ref 0.44–1.00)
GFR, Estimated: >60 mL/min (ref >60)
Glucose, Bld: 119 mg/dL — ABNORMAL HIGH (ref 70–99)
Potassium: 4.4 mmol/L (ref 3.5–5.1)
Sodium: 139 mmol/L (ref 135–145)

## 2024-09-07 LAB — CBC
HCT: 39.3 % (ref 36.0–46.0)
Hemoglobin: 12.4 g/dL (ref 12.0–15.0)
MCH: 26.2 pg (ref 26.0–34.0)
MCHC: 31.6 g/dL (ref 30.0–36.0)
MCV: 82.9 fL (ref 80.0–100.0)
Platelets: 302 K/uL (ref 150–400)
RBC: 4.74 MIL/uL (ref 3.87–5.11)
RDW: 14 % (ref 11.5–15.5)
WBC: 8 K/uL (ref 4.0–10.5)
nRBC: 0 % (ref 0.0–0.2)

## 2024-09-07 LAB — MAGNESIUM: Magnesium: 2.5 mg/dL — ABNORMAL HIGH (ref 1.7–2.4)

## 2024-09-07 MED ORDER — DILTIAZEM HCL ER COATED BEADS 240 MG PO CP24
240.0000 mg | ORAL_CAPSULE | Freq: Every day | ORAL | Status: DC
  Administered 2024-09-07: 240 mg via ORAL
  Filled 2024-09-07: qty 1

## 2024-09-07 MED ORDER — ZINC SULFATE 220 (50 ZN) MG PO CAPS: 220.0000 mg | ORAL_CAPSULE | Freq: Every day | ORAL | 0 refills | Status: AC

## 2024-09-07 MED ORDER — ATENOLOL 25 MG PO TABS
50.0000 mg | ORAL_TABLET | Freq: Every day | ORAL | Status: DC
  Administered 2024-09-07: 50 mg via ORAL
  Filled 2024-09-07: qty 2

## 2024-09-07 MED ORDER — VITAMIN D3 10 MCG (400 UNIT) PO CAPS: 400.0000 [IU] | ORAL_CAPSULE | Freq: Every day | ORAL | 0 refills | Status: AC

## 2024-09-07 MED ORDER — LOSARTAN POTASSIUM 50 MG PO TABS
50.0000 mg | ORAL_TABLET | Freq: Every day | ORAL | Status: DC
  Filled 2024-09-07: qty 1

## 2024-09-07 MED ORDER — PENTOXIFYLLINE ER 400 MG PO TBCR
400.0000 mg | EXTENDED_RELEASE_TABLET | Freq: Two times a day (BID) | ORAL | Status: DC
  Administered 2024-09-07: 400 mg via ORAL
  Filled 2024-09-07: qty 1

## 2024-09-07 MED ORDER — DOXYCYCLINE HYCLATE 100 MG PO CAPS: 100.0000 mg | ORAL_CAPSULE | Freq: Two times a day (BID) | ORAL | 0 refills | Status: AC

## 2024-09-07 MED ORDER — ASCORBIC ACID 500 MG PO TABS: 500.0000 mg | ORAL_TABLET | Freq: Two times a day (BID) | ORAL | 0 refills | Status: AC

## 2024-09-07 MED ORDER — OXYCODONE HCL 5 MG PO TABS: 5.0000 mg | ORAL_TABLET | ORAL | 0 refills | Status: AC | PRN

## 2024-09-07 MED ORDER — FUROSEMIDE 20 MG PO TABS
20.0000 mg | ORAL_TABLET | Freq: Every day | ORAL | Status: DC
  Administered 2024-09-07: 20 mg via ORAL
  Filled 2024-09-07: qty 1

## 2024-09-07 MED ORDER — ATORVASTATIN CALCIUM 40 MG PO TABS: 40.0000 mg | ORAL_TABLET | Freq: Every day | ORAL | 0 refills | Status: AC

## 2024-09-07 NOTE — Discharge Summary (Signed)
 Physician Discharge Summary  Stacy Case FMW:[id number] DOB: [birth year] DOA: 09/04/2024  PCP: [name], MD  Admit date: 09/04/2024  Discharge date: 09/07/2024  Admitted From:Home  Disposition:  Home  Recommendations for Outpatient Follow-up:  Follow up with PCP in 1-2 weeks Follow-up with podiatry Dr. Tobie on 10/27 as arranged Remain on doxycycline 100 mg twice daily for 10 days Oxycodone  prescribed as needed for severe pain and breakthrough for 15 tablets, otherwise use tramadol  Continue other home medications as prior  Home Health: Yes with RN  Equipment/Devices: None  Discharge Condition:Stable  CODE STATUS: Full  Diet recommendation: Heart Healthy  Brief/Interim Summary: 73 year old female with a history of GERD, hypertension, hyperlipidemia venous insufficiency, chronic pain syndrome with opioid dependence, iron  deficiency anemia presenting with increasing pain and swelling in the right foot and great toe.  Patient has undergone partial amputation of the right great toe on 10/22 with podiatry.  No other growth noted on blood cultures and she is essentially stable at this point for discharge.  Podiatry recommendations are for doxycycline to continue for 10 days and pain medications have been prescribed as needed for breakthrough.  Follow-up is scheduled for 10/27.  No other acute events or concerns noted during this admission.  Discharge Diagnoses:  Principal Problem:   Acute osteomyelitis of toe (HCC) Active Problems:   Lymphedema of both lower extremities   Essential hypertension   Mixed hyperlipidemia   Chronic kidney disease, stage 3a (HCC)   Lactic acidosis   Osteomyelitis of great toe (HCC)  Principal discharge diagnosis: Cellulitis/acute osteomyelitis of right great toe status post right great toe partial amputation 10/22.  Discharge Instructions  Discharge Instructions     Diet - low sodium heart healthy   Complete by: As directed    Increase  activity slowly   Complete by: As directed    Leave dressing on - Keep it clean, dry, and intact until clinic visit   Complete by: As directed       Allergies as of 09/07/2024       Reactions   Penicillamine Rash, Swelling   Clindamycin Other (See Comments)   Caused c. diff   Penicillins Rash        Medication List     TAKE these medications    acetaminophen  500 MG tablet Commonly known as: TYLENOL  Take 500 mg by mouth in the morning and at bedtime.   alendronate 70 MG tablet Commonly known as: FOSAMAX Take 70 mg by mouth every 7 (seven) days.   ascorbic acid 500 MG tablet Commonly known as: VITAMIN C Take 1 tablet (500 mg total) by mouth 2 (two) times daily. What changed: when to take this   aspirin  EC 81 MG tablet Take 81 mg by mouth daily. Swallow whole.   atenolol  50 MG tablet Commonly known as: TENORMIN  Take 50 mg by mouth daily.   atorvastatin  40 MG tablet Commonly known as: LIPITOR Take 1 tablet (40 mg total) by mouth daily.   cimetidine 200 MG tablet Commonly known as: TAGAMET Take 200 mg by mouth 2 (two) times daily as needed (ACID REFLUX/GERD).   dicyclomine  20 MG tablet Commonly known as: BENTYL  Take 20 mg by mouth 4 (four) times daily - after meals and at bedtime.   diltiazem  240 MG 24 hr capsule Commonly known as: TIAZAC  Take 240 mg by mouth daily.   doxycycline 100 MG capsule Commonly known as: VIBRAMYCIN Take 1 capsule (100 mg total) by mouth 2 (two) times daily for  10 days.   furosemide  20 MG tablet Commonly known as: LASIX  Take 20 mg by mouth daily.   losartan 50 MG tablet Commonly known as: COZAAR Take 50 mg by mouth at bedtime.   mirtazapine  30 MG tablet Commonly known as: REMERON  Take 30 mg by mouth at bedtime.   multivitamin with minerals Tabs tablet Take 1 tablet by mouth daily.   mupirocin ointment 2 % Commonly known as: BACTROBAN Apply 1 Application topically daily.   ondansetron  8 MG tablet Commonly known as:  ZOFRAN  Take 8 mg by mouth 3 (three) times daily.   oxyCODONE  5 MG immediate release tablet Commonly known as: Oxy IR/ROXICODONE  Take 1 tablet (5 mg total) by mouth every 4 (four) hours as needed for severe pain (pain score 7-10) or breakthrough pain.   pantoprazole  40 MG tablet Commonly known as: PROTONIX  Take 40 mg by mouth daily.   pentoxifylline 400 MG CR tablet Commonly known as: TRENTAL Take 400 mg by mouth 2 (two) times daily.   Repatha 140 MG/ML Sosy Generic drug: Evolocumab Inject 140 mg into the skin every 14 (fourteen) days. SUNDAYS   rosuvastatin 40 MG tablet Commonly known as: CRESTOR Take 40 mg by mouth daily.   traMADol 50 MG tablet Commonly known as: ULTRAM Take 100 mg by mouth 4 (four) times daily.   Vitamin D (Ergocalciferol) 1.25 MG (50000 UNIT) Caps capsule Commonly known as: DRISDOL Take 50,000 Units by mouth once a week.   Vitamin D3 10 MCG (400 UNIT) Caps Take 400 Units by mouth daily.   zinc sulfate (50mg elemental zinc) 220 (50 Zn) MG capsule Take 1 capsule (220 mg total) by mouth daily for 14 days. Start taking on: September 08, 2024               Discharge Care Instructions  (From admission, onward)           Start     Ordered   09/07/24 0000  Leave dressing on - Keep it clean, dry, and intact until clinic visit        10 /23/25 1013            Follow-up Information     [facility]  Follow up.   Contact information: [address] [place] [address] [phone]         [name], MD. Schedule an appointment as soon as possible for a visit in 1 week(s).   Specialty: Family Medicine Contact information: [address] [place] [address] [phone]         Tobie Buckles, DPM Follow up on 09/11/2024.   Specialty: Podiatry               Allergies  Allergen Reactions   Penicillamine Rash and Swelling   Clindamycin Other (See Comments)    Caused c. diff    Penicillins Rash    Consultations: Podiatry-Dr. Tobie   Procedures/Studies: CT ANGIO AO+BIFEM W & OR WO CONTRAST Result Date: 09/05/2024 CLINICAL DATA:  S[age] year right first toe ulceration. EXAM: CT ANGIOGRAPHY OF ABDOMINAL AORTA WITH ILIOFEMORAL RUNOFF TECHNIQUE: Multidetector CT imaging of the abdomen, pelvis and lower extremities was performed using the standard protocol during bolus administration of intravenous contrast. Multiplanar CT image reconstructions and MIPs were obtained to evaluate the vascular anatomy. RADIATION DOSE REDUCTION: This exam was performed according to the departmental dose-optimization program which includes automated exposure control, adjustment of the mA and/or kV according to patient size and/or use  of iterative reconstruction technique. CONTRAST:  125mL OMNIPAQUE IOHEXOL 350 MG/ML SOLN COMPARISON:  None Available. FINDINGS: VASCULAR Aorta: Normal caliber with circumferential fibrofatty and calcific atherosclerotic changes with diffuse luminal irregularity most prominent about the infrarenal portion. Celiac: Patent without evidence of aneurysm, dissection, vasculitis or significant stenosis. SMA: Patent without evidence of aneurysm, dissection, vasculitis or significant stenosis. Renals: Moderate to severe proximal ostial stenoses secondary to calcified atherosclerotic plaques of the single bilateral renal arteries. IMA: Patent without evidence of aneurysm, dissection, vasculitis or significant stenosis. RIGHT Lower Extremity Inflow: Common, internal and external iliac arteries are patent without evidence of aneurysm, dissection, vasculitis or significant stenosis. Coarse, nearly diffuse atherosclerotic calcifications. Outflow: The common and profundal femoral arteries are patent. The superficial femoral artery is patent proximally with scattered atherosclerotic calcifications with severe focal stenosis just distal to Hunter's canal. Diffuse circumferential  atherosclerotic calcific about the otherwise patent popliteal artery. Runoff: The anterior tibial artery is patent throughout. The peroneal artery is patent to the level of foot. The posterior tibial artery is chronically occluded throughout which. LEFT Lower Extremity Inflow: Common, internal and external iliac arteries are patent without evidence of aneurysm, dissection, vasculitis or significant stenosis. Coarse, nearly diffuse atherosclerotic calcifications. Outflow: The common femoral and profundal femoral arteries are patent. Superficial femoral artery is patent proximally with severe focal stenosis just inferior to Hunter's canal. Additionally, there is a severe focal stenosis in the P1 segment of the popliteal artery. There is distal reconstitution however the remaining P3 and P3 segments of the popliteal are diminutive with scattered atherosclerotic calcifications. Runoff: The anterior tibial and peroneal arteries are patent to the level of the foot. 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 Veins: No obvious venous abnormality within the limitations of this arterial phase study. Review of the MIP images confirms the above findings. NON-VASCULAR Lower chest: Diffuse centrilobular emphysema. Hepatobiliary: There is a simple cyst in the right lobe of the liver measuring up to 2.0 cm. The gallbladder is present and relatively decompressed. There is severe common bile duct dilation measuring up to approximately 1.8 cm in maximum short axis diameter. Mild central intrahepatic biliary ductal dilation. Pancreas: Unremarkable. No pancreatic ductal dilatation or surrounding inflammatory changes. Spleen: Normal in size without focal abnormality. Adrenals/Urinary Tract: Adrenal glands are unremarkable. Hyperattenuating (mean Hounsfield units 83) mass in the left inferior pole measuring up to 1.3 cm. Kidneys are otherwise within normal limits. No hydronephrosis. Bladder is unremarkable, partially obscured  by streak artifact in the pelvis. Stomach/Bowel: Stomach is within normal limits. Appendix is not definitively identified. No evidence of bowel wall thickening, distention, or inflammatory changes. Lymphatic: No abdominopelvic lymphadenopathy. Reproductive: Obscured by streak artifact, there is suggestion of postsurgical changes after hysterectomy Other: Small right inguinal hernia containing fat small bowel without complicating features. No ascites. Musculoskeletal: Status post bilateral total hip arthroplasties without complicating features. No acute osseous abnormality. IMPRESSION: VASCULAR 1. Right lower extremity: Patent inflow. Severe focal stenosis about the distal superficial femoral artery. 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 2. Left lower extremity: Patent inflow. Severe focal stenosis about the distal superficial femoral and proximal popliteal arteries. 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 3. Moderate to severe ostial stenoses about the single bilateral renal arteries secondary to calcified atherosclerotic plaque. 4.  Aortic Atherosclerosis (ICD10-I70.0). NON-VASCULAR 1. Hyperattenuating 1.3 cm left inferior pole renal mass. Recommend MRI without and with contrast for further characterization. 2. Indeterminate extrahepatic biliary ductal dilation. Recommend  correlation with hepatic function/bilirubin and if further evaluation is warranted, consider MRCP. 3. Incidentally noted pulmonary emphysema (ICD10-J43.9). Ester Sides, MD [facility] Radiology Electronically Signed   By: Ester Sides M.D.   On: 09/05/2024 15:38   US  ARTERIAL ABI (SCREENING LOWER EXTREMITY) Result Date: 09/05/2024 CLINICAL DATA:  Total necrosis for 1 week. Former smoker. Hypertension. Hyperlipidemia. EXAM: NONINVASIVE PHYSIOLOGIC VASCULAR STUDY OF BILATERAL LOWER EXTREMITIES TECHNIQUE:  Evaluation of both lower extremities were performed at rest, including calculation of ankle-brachial indices with single level pressure measurements and doppler recording. COMPARISON:  None available. FINDINGS: Right ABI:  0.92 Left ABI:  1.03 Right Lower Extremity: Posterior tibial and dorsalis and pedis artery waveforms are monophasic. Left Lower Extremity: Dorsalis pedis artery waveform is monophasic. Posterior tibial artery could not be assessed due to bandage in place. 0.9-0.99 Borderline PAD IMPRESSION: Minimally decreased RIGHT ankle-brachial index indicative of borderline peripheral arterial disease. LEFT dorsalis pedis ABI is within normal limits. However, given monophasic waveform seen bilaterally, there may still be significant underlying peripheral arterial disease. Consider further evaluation with CT angiography abdominal aorta with runoff. Electronically Signed   By: Aliene Lloyd M.D.   On: 09/05/2024 07:45   DG Toe Great Right Result Date: 09/04/2024 CLINICAL DATA:  Open wound involving the great toe. EXAM: RIGHT GREAT TOE COMPARISON:  None Available. FINDINGS: Open wound noted along the distal aspect of the great toe with underlying destructive bony changes consistent with osteomyelitis. No findings suspicious for septic arthritis. IMPRESSION: Open wound along the distal aspect of the great toe with underlying destructive bony changes consistent with osteomyelitis. Electronically Signed   By: MYRTIS Stammer M.D.   On: 09/04/2024 14:43     Discharge Exam: Vitals:   09/07/24 0911 09/07/24 0915  BP: (!) 134/95 (!) 134/95  Pulse: (!) 101 (!) 101  Resp:  (!) 24  Temp:  98.5 F (36.9 C)  SpO2:  96%   Vitals:   09/07/24 0007 09/07/24 0417 09/07/24 0911 09/07/24 0915  BP: (!) 160/102 (!) 160/109 (!) 134/95 (!) 134/95  Pulse:  80 (!) 101 (!) 101  Resp:  18  (!) 24  Temp:  98 F (36.7 C)  98.5 F (36.9 C)  TempSrc:  Oral  Oral  SpO2:  97%  96%  Weight:      Height:         General: Pt is alert, awake, not in acute distress Cardiovascular: RRR, S1/S2 +, no rubs, no gallops Respiratory: CTA bilaterally, no wheezing, no rhonchi Abdominal: Soft, NT, ND, bowel sounds + Extremities: Right lower extremity in dressings clean dry and intact    The results of significant diagnostics from this hospitalization (including imaging, microbiology, ancillary and laboratory) are listed below for reference.     Microbiology: Recent Results (from the past 240 hours)  Culture, blood (single)     Status: None (Preliminary result)   Collection Time: 09/04/24  1:36 PM   Specimen: BLOOD  Result Value Ref Range Status   Specimen Description BLOOD BLOOD RIGHT ARM  Final   Special Requests   Final    BOTTLES DRAWN AEROBIC AND ANAEROBIC Blood Culture adequate volume   Culture   Final    NO GROWTH 3 DAYS Performed at [facility], [address], [place], [place] [address]    Report Status PENDING  Incomplete  Culture, blood (single)     Status: None (Preliminary result)   Collection Time: 09/04/24  5:33 PM   Specimen:  BLOOD  Result Value Ref Range Status   Specimen Description BLOOD BLOOD RIGHT HAND  Final   Special Requests   Final    AEROBIC BOTTLE ONLY Blood Culture results may not be optimal due to an inadequate volume of blood received in culture bottles   Culture   Final    NO GROWTH 3 DAYS Performed at [facility], [address], [place], [place] [address]    Report Status PENDING  Incomplete  Surgical PCR screen     Status: None   Collection Time: 09/06/24  1:30 AM   Specimen: Nasal Mucosa; Nasal Swab  Result Value Ref Range Status   MRSA, PCR NEGATIVE NEGATIVE Final   Staphylococcus aureus NEGATIVE NEGATIVE Final    Comment: (NOTE) The Xpert SA Assay (FDA approved for NASAL specimens in patients 29 years of age and older), is one component of a comprehensive surveillance program. It is not intended to diagnose infection nor to guide or monitor  treatment. Performed at [facility], [address], [place], [place] [address]   Aerobic/Anaerobic Culture w Gram Stain (surgical/deep wound)     Status: None (Preliminary result)   Collection Time: 09/06/24 11:03 AM   Specimen: Foot, Right; Wound  Result Value Ref Range Status   Specimen Description   Final    WOUND Performed at [facility], [address], [place], [place] [address]    Special Requests   Final    RIGHT TOE Performed at [facility], [address] [place], [place] [address]    Gram Stain NO WBC SEEN NO ORGANISMS SEEN   Final   Culture   Final    CULTURE REINCUBATED FOR BETTER GROWTH Performed at [facility], [address], [place], [place] [address]    Report Status PENDING  Incomplete     Labs: BNP (last 3 results) No results for input(s): BNP in the last 8760 hours. Basic Metabolic Panel: Recent Labs  Lab 09/04/24 1336 09/05/24 0446 09/06/24 0415 09/07/24 0407  NA 142 140 139 139  K 3.9 4.0 3.3* 4.4  CL 104 105 102 103  CO2 24 25 24 25   GLUCOSE 87 112* 101* 119*  BUN 21 20 17 19   CREATININE 1.26* 1.07* 0.83 0.89  CALCIUM  8.9 9.0 9.0 9.4  MG  --   --   --  2.5*   Liver Function Tests: Recent Labs  Lab 09/04/24 1336  AST 30  ALT 19  ALKPHOS 104  BILITOT 0.2  PROT 7.6  ALBUMIN 3.9   No results for input(s): LIPASE, AMYLASE in the last 168 hours. No results for input(s): AMMONIA in the last 168 hours. CBC: Recent Labs  Lab 09/04/24 1336 09/05/24 0446 09/06/24 0415 09/07/24 0407  WBC 9.1 8.1 6.5 8.0  NEUTROABS 6.2  --   --   --   HGB 11.3* 11.8* 12.0 12.4  HCT 36.5 38.2 37.8 39.3  MCV 85.1 84.3 82.5 82.9  PLT 357 313 297 302   Cardiac Enzymes: No results for input(s): CKTOTAL, CKMB, CKMBINDEX, TROPONINI in the last 168 hours. BNP: Invalid input(s): POCBNP CBG: No results for input(s): GLUCAP in the last 168 hours. D-Dimer No results for input(s): DDIMER in the last 72 hours. Hgb A1c No  results for input(s): HGBA1C in the last 72 hours. Lipid Profile No results for input(s): CHOL, HDL, LDLCALC, TRIG, CHOLHDL, LDLDIRECT in the last 72 hours. Thyroid  function studies No results for input(s): TSH, T4TOTAL, T3FREE, THYROIDAB in the last 72  hours.  Invalid input(s): FREET3 Anemia work up No results for input(s): VITAMINB12, FOLATE, FERRITIN, TIBC, IRON , RETICCTPCT in the last 72 hours. Urinalysis No results found for: COLORURINE, APPEARANCEUR, LABSPEC, PHURINE, GLUCOSEU, HGBUR, BILIRUBINUR, KETONESUR, PROTEINUR, UROBILINOGEN, NITRITE, LEUKOCYTESUR Sepsis Labs Recent Labs  Lab 09/04/24 1336 09/05/24 0446 09/06/24 0415 09/07/24 0407  WBC 9.1 8.1 6.5 8.0   Microbiology Recent Results (from the past 240 hours)  Culture, blood (single)     Status: None (Preliminary result)   Collection Time: 09/04/24  1:36 PM   Specimen: BLOOD  Result Value Ref Range Status   Specimen Description BLOOD BLOOD RIGHT ARM  Final   Special Requests   Final    BOTTLES DRAWN AEROBIC AND ANAEROBIC Blood Culture adequate volume   Culture   Final    NO GROWTH 3 DAYS Performed at [facility], [address], [place], [place] [address]    Report Status PENDING  Incomplete  Culture, blood (single)     Status: None (Preliminary result)   Collection Time: 09/04/24  5:33 PM   Specimen: BLOOD  Result Value Ref Range Status   Specimen Description BLOOD BLOOD RIGHT HAND  Final   Special Requests   Final    AEROBIC BOTTLE ONLY Blood Culture results may not be optimal due to an inadequate volume of blood received in culture bottles   Culture   Final    NO GROWTH 3 DAYS Performed at [facility], [address] [place], [place] [address]    Report Status PENDING  Incomplete  Surgical PCR screen     Status: None   Collection Time: 09/06/24  1:30 AM   Specimen: Nasal Mucosa; Nasal Swab  Result Value Ref Range Status   MRSA, PCR NEGATIVE  NEGATIVE Final   Staphylococcus aureus NEGATIVE NEGATIVE Final    Comment: (NOTE) The Xpert SA Assay (FDA approved for NASAL specimens in patients 51 years of age and older), is one component of a comprehensive surveillance program. It is not intended to diagnose infection nor to guide or monitor treatment. Performed at [facility], [address], [place], [place] [address]   Aerobic/Anaerobic Culture w Gram Stain (surgical/deep wound)     Status: None (Preliminary result)   Collection Time: 09/06/24 11:03 AM   Specimen: Foot, Right; Wound  Result Value Ref Range Status   Specimen Description   Final    WOUND Performed at [facility], [address], [place], [place] [address]    Special Requests   Final    RIGHT TOE Performed at [facility], [address] [place], [place] [address]    Gram Stain NO WBC SEEN NO ORGANISMS SEEN   Final   Culture   Final    CULTURE REINCUBATED FOR BETTER GROWTH Performed at [facility], [address], [place], [place] [address]    Report Status PENDING  Incomplete     Time coordinating discharge: 35 minutes  SIGNED:   Adron JONETTA Fairly, DO [facility] 09/07/2024, 10:17 AM  If 7PM-7AM, please contact night-coverage [email or web address]

## 2024-09-07 NOTE — Progress Notes (Signed)
 S: Patient was seen at bedside this morning. States the toe was hurting last night. Was given oxy for the pain and it helped. Patient is s/p partial amputation of the right hallux. POD#1. Wants to know when she can go home.   O:  Dermatology: Sutures are intact at the partial amputation of the right hallux site. Flap is warm to touch. No necrosis noted. No drainage noted. No erythema noted.  Venous stasis ulcer present on lower legs on both sides.  Vascular: DP pulses palpable. PT non palpable. Discoloration of both lower legs noted. Pedal hair growth is absent. Capillary refill time is brisk to lesser toes.  Neurology: Protective sensation is intact as tested with 5.07g monofilament. Pain at the ulcer site.  Musculoskeletol: Muscle strength is 5/5.   A: S/P partial right hallux amputation.  Venous stasis ulceration bilateral lower leg.   P: Patient examined and evaluated. Aquacell Ag+ dressing applied on both legs and at the amputation site. Instructed patient to keep the dressing clean, dry and intact. Do NOT change dressing.  Recommend wearing the surgical shoe on the right.  Explained her to limit her walking in the house. Recommend elevating both her legs.  Home health will be resumed once she gets discharged from the hospital for the venous stasis ulcer care.  Patient can be discharged on oral doxycycline 100mg  BID for 10 days.  Can take tramadol  for pain at home.  Stable to discharge home from podiatry point of view.  Will follow up with patient on MONDAY 09/11/24  at my office.

## 2024-09-07 NOTE — Plan of Care (Signed)

## 2024-09-07 NOTE — TOC Transition Note (Signed)
 Transition of Care [facility]) - Discharge Note   Patient Details  Name: Stacy Case MRN: [id number] Date of Birth: 12-May-1951  [facility]) CM/SW Contact:  Hoy DELENA Bigness, LCSW Phone Number: 09/07/2024, 10:15 AM   Clinical Narrative:    Pt to return home with continued home health RN services provided by [facility]. HH order is in. No further ICM needs identified.    Final next level of care: Home w Home Health Services Barriers to Discharge: Barriers Resolved   Patient Goals and CMS Choice Patient states their goals for this hospitalization and ongoing recovery are:: To return home          Discharge Placement                       Discharge Plan and Services Additional resources added to the After Visit Summary for                            [facility] Arranged: RN [facility] Agency: [facility] Date HH Agency Contacted: 09/05/24 Time HH Agency Contacted: 1137 Representative spoke with at [facility] Agency: Selinda  Social Drivers of Health (SDOH) Interventions SDOH Screenings   Food Insecurity: No Food Insecurity (09/04/2024)  Housing: Low Risk  (09/04/2024)  Transportation Needs: No Transportation Needs (09/04/2024)  Utilities: Not At Risk (09/04/2024)  Financial Resource Strain: Low Risk  (10/17/2020)   Received from [facility]  Social Connections: Socially Isolated (09/05/2024)  Tobacco Use: Medium Risk (09/06/2024)  Health Literacy: Low Risk  (07/02/2020)   Received from [facility]     Readmission Risk Interventions     No data to display

## 2024-09-07 NOTE — Care Management Important Message (Signed)
 Important Message  Patient Details  Name: Stacy Case MRN: [id number] Date of Birth: 1950-11-19   Important Message Given:  Yes - Medicare IM     Stacy Case Stacy Case 09/07/2024, 12:00 PM

## 2024-09-08 ENCOUNTER — Telehealth: Payer: Self-pay

## 2024-09-08 LAB — SURGICAL PATHOLOGY

## 2024-09-08 NOTE — Anesthesia Postprocedure Evaluation (Signed)
 Anesthesia Post Note  Patient: Stacy Case  Procedure(s) Performed: AMPUTATION, TOE (Right: Toe)  Patient location during evaluation: Phase II Anesthesia Type: General Level of consciousness: awake Pain management: pain level controlled Vital Signs Assessment: post-procedure vital signs reviewed and stable Respiratory status: spontaneous breathing and respiratory function stable Cardiovascular status: blood pressure returned to baseline and stable Postop Assessment: no headache and no apparent nausea or vomiting Anesthetic complications: no Comments: Late entry   No notable events documented.   Last Vitals:  Vitals:   09/07/24 0915 09/07/24 1008  BP: (!) 134/95 (!) 124/91  Pulse: (!) 101 89  Resp: (!) 24   Temp: 36.9 C   SpO2: 96%     Last Pain:  Vitals:   09/07/24 1129  TempSrc:   PainSc: 8                  Yvonna JINNY Bosworth

## 2024-09-08 NOTE — Transitions of Care (Post Inpatient/ED Visit) (Signed)
   09/08/2024  Name: Stacy Case MRN: [id number] DOB: [birth year]  Today's TOC FU Call Status: Today's TOC FU Call Status:: Unsuccessful Call (1st Attempt) Unsuccessful Call (1st Attempt) Date: 09/08/24  Attempted to reach the patient regarding the most recent Inpatient/ED visit.  Follow Up Plan: Additional outreach attempts will be made to reach the patient to complete the Transitions of Care (Post Inpatient/ED visit) call.   Medford Balboa, BSN, RN [place]  [facility]I - [facility] RN Care Manager [phone]

## 2024-09-09 LAB — CULTURE, BLOOD (SINGLE)
Culture: NO GROWTH
Culture: NO GROWTH
Special Requests: ADEQUATE

## 2024-09-11 ENCOUNTER — Telehealth: Payer: Self-pay | Admitting: *Deleted

## 2024-09-11 DIAGNOSIS — Z4889 Encounter for other specified surgical aftercare: Secondary | ICD-10-CM | POA: Diagnosis not present

## 2024-09-11 LAB — AEROBIC/ANAEROBIC CULTURE W GRAM STAIN (SURGICAL/DEEP WOUND): Gram Stain: NONE SEEN

## 2024-09-11 NOTE — Transitions of Care (Post Inpatient/ED Visit) (Signed)
   09/11/2024  Name: Stacy Case MRN: [id number] DOB: Feb 07, 1951  Today's TOC FU Call Status: Today's TOC FU Call Status:: Unsuccessful Call (2nd Attempt) Unsuccessful Call (2nd Attempt) Date: 09/11/24  Attempted to reach the patient regarding the most recent Inpatient visit.  Left HIPAA compliant voice message  Follow Up Plan: Additional outreach attempts will be made to reach the patient to complete the Transitions of Care (Post Inpatient/ED visit) call.   Pls call/ message for questions,  Jatin Naumann Mckinney Sy Saintjean, RN, BSN, CCRN [employer]umnus RN Care Manager  Transitions of Care  [facility] - [facility] [phone]: direct office

## 2024-09-12 ENCOUNTER — Telehealth: Payer: Self-pay | Admitting: *Deleted

## 2024-09-12 NOTE — Transitions of Care (Post Inpatient/ED Visit) (Signed)
   09/12/2024  Name: Stacy Case MRN: [id number] DOB: 11/02/1951  Today's TOC FU Call Status: Today's TOC FU Call Status:: Unsuccessful Call (3rd Attempt) Unsuccessful Call (3rd Attempt) Date: 09/12/24  Attempted to reach the patient regarding the most recent Inpatient visit.  Left HIPAA compliant voice message   Follow Up Plan: No further outreach attempts will be made at this time. We have been unable to contact the patient.  Pls call/ message for questions,  Modene Andy Mckinney Melinda Pottinger, RN, BSN, CCRN Alumnus RN Care Manager  Transitions of Care  [facility] - [facility] [phone]: direct office

## 2024-09-14 DIAGNOSIS — Z23 Encounter for immunization: Secondary | ICD-10-CM | POA: Diagnosis not present

## 2024-09-18 DIAGNOSIS — Z4889 Encounter for other specified surgical aftercare: Secondary | ICD-10-CM | POA: Diagnosis not present

## 2024-09-18 DIAGNOSIS — I83009 Varicose veins of unspecified lower extremity with ulcer of unspecified site: Secondary | ICD-10-CM | POA: Diagnosis not present

## 2024-09-21 DIAGNOSIS — I10 Essential (primary) hypertension: Secondary | ICD-10-CM | POA: Diagnosis not present

## 2024-09-21 DIAGNOSIS — N1831 Chronic kidney disease, stage 3a: Secondary | ICD-10-CM | POA: Diagnosis not present

## 2024-09-21 DIAGNOSIS — L97321 Non-pressure chronic ulcer of left ankle limited to breakdown of skin: Secondary | ICD-10-CM | POA: Diagnosis not present

## 2024-09-21 DIAGNOSIS — Z7982 Long term (current) use of aspirin: Secondary | ICD-10-CM | POA: Diagnosis not present

## 2024-09-21 DIAGNOSIS — Z602 Problems related to living alone: Secondary | ICD-10-CM | POA: Diagnosis not present

## 2024-09-21 DIAGNOSIS — E782 Mixed hyperlipidemia: Secondary | ICD-10-CM | POA: Diagnosis not present

## 2024-09-21 DIAGNOSIS — M86179 Other acute osteomyelitis, unspecified ankle and foot: Secondary | ICD-10-CM | POA: Diagnosis not present

## 2024-09-25 DIAGNOSIS — Z4889 Encounter for other specified surgical aftercare: Secondary | ICD-10-CM | POA: Diagnosis not present

## 2024-09-25 DIAGNOSIS — I83009 Varicose veins of unspecified lower extremity with ulcer of unspecified site: Secondary | ICD-10-CM | POA: Diagnosis not present

## 2024-10-02 DIAGNOSIS — I83009 Varicose veins of unspecified lower extremity with ulcer of unspecified site: Secondary | ICD-10-CM | POA: Diagnosis not present

## 2024-10-02 DIAGNOSIS — T8130XA Disruption of wound, unspecified, initial encounter: Secondary | ICD-10-CM | POA: Diagnosis not present

## 2024-10-02 DIAGNOSIS — Z4889 Encounter for other specified surgical aftercare: Secondary | ICD-10-CM | POA: Diagnosis not present

## 2024-10-09 DIAGNOSIS — M86179 Other acute osteomyelitis, unspecified ankle and foot: Secondary | ICD-10-CM | POA: Diagnosis not present

## 2024-10-09 DIAGNOSIS — I872 Venous insufficiency (chronic) (peripheral): Secondary | ICD-10-CM | POA: Diagnosis not present

## 2024-10-09 DIAGNOSIS — E782 Mixed hyperlipidemia: Secondary | ICD-10-CM | POA: Diagnosis not present

## 2024-10-09 DIAGNOSIS — F1120 Opioid dependence, uncomplicated: Secondary | ICD-10-CM | POA: Diagnosis not present

## 2024-10-09 DIAGNOSIS — T8130XA Disruption of wound, unspecified, initial encounter: Secondary | ICD-10-CM | POA: Diagnosis not present

## 2024-10-09 DIAGNOSIS — N1831 Chronic kidney disease, stage 3a: Secondary | ICD-10-CM | POA: Diagnosis not present

## 2024-10-09 DIAGNOSIS — I739 Peripheral vascular disease, unspecified: Secondary | ICD-10-CM | POA: Diagnosis not present

## 2024-10-09 DIAGNOSIS — Z602 Problems related to living alone: Secondary | ICD-10-CM | POA: Diagnosis not present

## 2024-10-09 DIAGNOSIS — L97321 Non-pressure chronic ulcer of left ankle limited to breakdown of skin: Secondary | ICD-10-CM | POA: Diagnosis not present

## 2024-10-09 DIAGNOSIS — Z7982 Long term (current) use of aspirin: Secondary | ICD-10-CM | POA: Diagnosis not present

## 2024-10-09 DIAGNOSIS — J449 Chronic obstructive pulmonary disease, unspecified: Secondary | ICD-10-CM | POA: Diagnosis not present

## 2024-10-09 DIAGNOSIS — I129 Hypertensive chronic kidney disease with stage 1 through stage 4 chronic kidney disease, or unspecified chronic kidney disease: Secondary | ICD-10-CM | POA: Diagnosis not present

## 2024-10-09 DIAGNOSIS — I10 Essential (primary) hypertension: Secondary | ICD-10-CM | POA: Diagnosis not present

## 2024-10-09 DIAGNOSIS — I83009 Varicose veins of unspecified lower extremity with ulcer of unspecified site: Secondary | ICD-10-CM | POA: Diagnosis not present

## 2024-10-19 DIAGNOSIS — I129 Hypertensive chronic kidney disease with stage 1 through stage 4 chronic kidney disease, or unspecified chronic kidney disease: Secondary | ICD-10-CM | POA: Diagnosis not present

## 2024-10-19 DIAGNOSIS — F1120 Opioid dependence, uncomplicated: Secondary | ICD-10-CM | POA: Diagnosis not present

## 2024-10-19 DIAGNOSIS — L97321 Non-pressure chronic ulcer of left ankle limited to breakdown of skin: Secondary | ICD-10-CM | POA: Diagnosis not present

## 2024-10-19 DIAGNOSIS — N1831 Chronic kidney disease, stage 3a: Secondary | ICD-10-CM | POA: Diagnosis not present

## 2024-10-19 DIAGNOSIS — M86179 Other acute osteomyelitis, unspecified ankle and foot: Secondary | ICD-10-CM | POA: Diagnosis not present

## 2024-10-19 DIAGNOSIS — J449 Chronic obstructive pulmonary disease, unspecified: Secondary | ICD-10-CM | POA: Diagnosis not present

## 2024-10-19 DIAGNOSIS — Z7982 Long term (current) use of aspirin: Secondary | ICD-10-CM | POA: Diagnosis not present

## 2024-10-19 DIAGNOSIS — I10 Essential (primary) hypertension: Secondary | ICD-10-CM | POA: Diagnosis not present

## 2024-10-19 DIAGNOSIS — Z602 Problems related to living alone: Secondary | ICD-10-CM | POA: Diagnosis not present

## 2024-10-19 DIAGNOSIS — I739 Peripheral vascular disease, unspecified: Secondary | ICD-10-CM | POA: Diagnosis not present

## 2024-10-19 DIAGNOSIS — E782 Mixed hyperlipidemia: Secondary | ICD-10-CM | POA: Diagnosis not present

## 2024-10-31 ENCOUNTER — Ambulatory Visit (HOSPITAL_COMMUNITY): Payer: Self-pay | Attending: Vascular Surgery

## 2024-10-31 ENCOUNTER — Ambulatory Visit (HOSPITAL_COMMUNITY): Admission: RE | Admit: 2024-10-31 | Payer: Self-pay | Source: Ambulatory Visit

## 2024-10-31 ENCOUNTER — Ambulatory Visit: Payer: Self-pay | Admitting: Vascular Surgery

## 2024-10-31 DIAGNOSIS — F1120 Opioid dependence, uncomplicated: Secondary | ICD-10-CM | POA: Diagnosis not present

## 2024-10-31 DIAGNOSIS — I10 Essential (primary) hypertension: Secondary | ICD-10-CM | POA: Diagnosis not present

## 2024-10-31 DIAGNOSIS — Z602 Problems related to living alone: Secondary | ICD-10-CM | POA: Diagnosis not present

## 2024-10-31 DIAGNOSIS — M86179 Other acute osteomyelitis, unspecified ankle and foot: Secondary | ICD-10-CM | POA: Diagnosis not present

## 2024-10-31 DIAGNOSIS — L97321 Non-pressure chronic ulcer of left ankle limited to breakdown of skin: Secondary | ICD-10-CM | POA: Diagnosis not present

## 2024-10-31 DIAGNOSIS — J449 Chronic obstructive pulmonary disease, unspecified: Secondary | ICD-10-CM | POA: Diagnosis not present

## 2024-10-31 DIAGNOSIS — N1831 Chronic kidney disease, stage 3a: Secondary | ICD-10-CM | POA: Diagnosis not present

## 2024-10-31 DIAGNOSIS — I129 Hypertensive chronic kidney disease with stage 1 through stage 4 chronic kidney disease, or unspecified chronic kidney disease: Secondary | ICD-10-CM | POA: Diagnosis not present

## 2024-10-31 DIAGNOSIS — I739 Peripheral vascular disease, unspecified: Secondary | ICD-10-CM | POA: Diagnosis not present

## 2024-10-31 DIAGNOSIS — E782 Mixed hyperlipidemia: Secondary | ICD-10-CM | POA: Diagnosis not present

## 2024-10-31 DIAGNOSIS — Z7982 Long term (current) use of aspirin: Secondary | ICD-10-CM | POA: Diagnosis not present

## 2024-11-06 DIAGNOSIS — L97511 Non-pressure chronic ulcer of other part of right foot limited to breakdown of skin: Secondary | ICD-10-CM | POA: Diagnosis not present

## 2024-11-15 NOTE — Therapy (Unsigned)
 " OUTPATIENT PHYSICAL THERAPY Wound EVALUATION   Patient Name: Stacy Case MRN: [id number] DOB:04-05-1951, 73 y.o., female Today's Date: 11/15/2024   PCP: Jerel Sieving REFERRING PROVIDER: Tobie Buckles, DPM  END OF SESSION:   Past Medical History:  Diagnosis Date   Anxiety    Arthritis    Cellulitis of ankle    COPD (chronic obstructive pulmonary disease) (HCC)    Cystocele    Depression    GERD (gastroesophageal reflux disease)    Headache    hx of migraines   Hyperlipidemia    Hypertension    IBS (irritable bowel syndrome)    Peripheral arterial disease    legs   Pneumonia    Right inguinal hernia    Past Surgical History:  Procedure Laterality Date   ABDOMINAL AORTAGRAM N/A 11/20/2013   Procedure: ABDOMINAL EZELLA;  Surgeon: Lonni GORMAN Blade, MD;  Location: [facility] CATH LAB;  Service: Cardiovascular;  Laterality: N/A;   AMPUTATION TOE Right 09/06/2024   Procedure: AMPUTATION, TOE;  Surgeon: Tobie Buckles, DPM;  Location: [facility] ORS;  Service: Podiatry;  Laterality: Right;   APPENDECTOMY     JOINT REPLACEMENT     Left hip 2018   TOTAL HIP ARTHROPLASTY Right 02/28/2020   Procedure: TOTAL HIP ARTHROPLASTY ANTERIOR APPROACH;  Surgeon: Melodi Lerner, MD;  Location: [facility] ORS;  Service: Orthopedics;  Laterality: Right;    WRIST SURGERY     Patient Active Problem List   Diagnosis Date Noted   Acute osteomyelitis of toe (HCC) 09/04/2024   Essential hypertension 09/04/2024   Mixed hyperlipidemia 09/04/2024   Chronic kidney disease, stage 3a (HCC) 09/04/2024   Lactic acidosis 09/04/2024   Osteomyelitis of great toe (HCC) 09/04/2024   Excoriation of multiple sites of lower extremity, left, initial encounter 05/14/2021   Venous stasis ulcer of left calf with fat layer exposed without varicose veins (HCC) 05/14/2021   Open wound of right great toe 03/05/2021   Age-related osteoporosis without current pathological fracture 10/17/2020   Gastroesophageal  reflux disease without esophagitis 10/17/2020   Iron  deficiency anemia due to chronic blood loss 10/17/2020   Irritable bowel syndrome with diarrhea 10/17/2020   Moderate major depression (HCC) 10/17/2020   Hyperlipidemia 10/16/2020   Venous stasis ulcer of left ankle (HCC) 09/25/2020   Risk for falls 09/13/2020   ISTAP type 1 skin tear of right lower leg 08/21/2020   Lymphedema of both lower extremities 07/31/2020   Rectal bleeding 07/31/2020   Uterine prolapse 07/31/2020   History of total hip replacement, right 04/01/2020   OA (osteoarthritis) of hip 02/28/2020   Primary osteoarthritis of right hip 02/28/2020   Anemia 12/15/2019   Reducible right inguinal hernia 12/15/2019   S/P right inguinal hernia repair 12/15/2019   Postoperative Clostridium difficile infection 09/21/2017   Edema of both lower legs due to peripheral venous insufficiency 08/04/2017   Atherosclerotic peripheral vascular disease with ulceration (HCC) 11/15/2013    ONSET DATE: 09/06/24  REFERRING DIAG: T81.30XA (ICD-10-CM) - Wound dehiscence I83.009,L97.909 (ICD-10-CM) - Venous stasis ulcer (HCC)  THERAPY DIAG:  T81.30XA (ICD-10-CM) - Wound dehiscence I83.009,L97.909 (ICD-10-CM) - Venous stasis ulcer (HCC) Right foot pain  Rationale for Evaluation and Treatment: Rehabilitation  69  73 year old female with a history of GERD, hypertension, hyperlipidemia venous insufficiency, chronic pain syndrome with opioid dependence, iron  deficiency anemia presenting with increasing pain and swelling in the right foot and great toe.  Patient has undergone partial amputation of the right great toe on 10/22 with podiatry.    PATIENT  EDUCATION: Education details: complete toe crunches/toe extension and ankle pump Person educated: Patient Education method: Explanation Education comprehension: verbalized understanding   HOME EXERCISE PROGRAM: Toe crunches, extension and ankle pumps.    GOALS: Goals reviewed with  patient? {yes/no:20286}  SHORT TERM GOALS: Target date: ***  *** Baseline: Goal status: {GOALSTATUS:25110}  2.  *** Baseline:  Goal status: {GOALSTATUS:25110}  3.  *** Baseline:  Goal status: {GOALSTATUS:25110}  4.  *** Baseline:  Goal status: {GOALSTATUS:25110}  5.  *** Baseline:  Goal status: {GOALSTATUS:25110}  6.  *** Baseline:  Goal status: {GOALSTATUS:25110}  LONG TERM GOALS: Target date: ***  *** Baseline:  Goal status: {GOALSTATUS:25110}  2.  *** Baseline:  Goal status: {GOALSTATUS:25110}  3.  *** Baseline:  Goal status: {GOALSTATUS:25110}  4.  *** Baseline:  Goal status: {GOALSTATUS:25110}  5.  *** Baseline:  Goal status: {GOALSTATUS:25110}  6.  *** Baseline:  Goal status: {GOALSTATUS:25110}  ASSESSMENT:  CLINICAL IMPRESSION: Patient is a *** y.o. *** who was seen today for physical therapy evaluation and treatment for ***.    OBJECTIVE IMPAIRMENTS: {opptimpairments:25111}.   ACTIVITY LIMITATIONS: {activitylimitations:27494}  PARTICIPATION LIMITATIONS: {participationrestrictions:25113}  PERSONAL FACTORS: {Personal factors:25162} are also affecting patient's functional outcome.   REHAB POTENTIAL: {rehabpotential:25112}  CLINICAL DECISION MAKING: {clinical decision making:25114}  EVALUATION COMPLEXITY: {Evaluation complexity:25115}  PLAN: PT FREQUENCY: {rehab frequency:25116}  PT DURATION: {rehab duration:25117}  PLANNED INTERVENTIONS: {rehab planned interventions:25118::97110-Therapeutic exercises,97530- Therapeutic [phone]- Neuromuscular re-education,97535- Self Rjmz,02859- Manual therapy,Patient/Family education}  PLAN FOR NEXT SESSION: ***   Garin Mata,CINDY, PT 11/15/2024, 5:31 PM        "

## 2024-11-17 ENCOUNTER — Ambulatory Visit (HOSPITAL_COMMUNITY): Attending: Family Medicine | Admitting: Physical Therapy

## 2024-11-17 ENCOUNTER — Other Ambulatory Visit: Payer: Self-pay

## 2024-11-17 DIAGNOSIS — S91104A Unspecified open wound of right lesser toe(s) without damage to nail, initial encounter: Secondary | ICD-10-CM | POA: Diagnosis present

## 2024-11-17 DIAGNOSIS — S91001S Unspecified open wound, right ankle, sequela: Secondary | ICD-10-CM | POA: Insufficient documentation

## 2024-11-17 DIAGNOSIS — M25572 Pain in left ankle and joints of left foot: Secondary | ICD-10-CM | POA: Diagnosis present

## 2024-11-17 DIAGNOSIS — M79671 Pain in right foot: Secondary | ICD-10-CM | POA: Insufficient documentation

## 2024-11-17 DIAGNOSIS — S91301D Unspecified open wound, right foot, subsequent encounter: Secondary | ICD-10-CM | POA: Diagnosis present

## 2024-11-17 DIAGNOSIS — S91002S Unspecified open wound, left ankle, sequela: Secondary | ICD-10-CM | POA: Diagnosis present

## 2024-11-17 DIAGNOSIS — S91201D Unspecified open wound of right great toe with damage to nail, subsequent encounter: Secondary | ICD-10-CM | POA: Insufficient documentation

## 2024-11-21 ENCOUNTER — Ambulatory Visit (HOSPITAL_COMMUNITY): Admitting: Physical Therapy

## 2024-11-21 DIAGNOSIS — M25572 Pain in left ankle and joints of left foot: Secondary | ICD-10-CM

## 2024-11-21 DIAGNOSIS — S91104A Unspecified open wound of right lesser toe(s) without damage to nail, initial encounter: Secondary | ICD-10-CM

## 2024-11-21 DIAGNOSIS — S91301D Unspecified open wound, right foot, subsequent encounter: Secondary | ICD-10-CM

## 2024-11-21 DIAGNOSIS — S91002S Unspecified open wound, left ankle, sequela: Secondary | ICD-10-CM

## 2024-11-21 DIAGNOSIS — M79671 Pain in right foot: Secondary | ICD-10-CM

## 2024-11-21 DIAGNOSIS — S91001S Unspecified open wound, right ankle, sequela: Secondary | ICD-10-CM | POA: Diagnosis not present

## 2024-11-21 DIAGNOSIS — S91201D Unspecified open wound of right great toe with damage to nail, subsequent encounter: Secondary | ICD-10-CM

## 2024-11-21 NOTE — Therapy (Signed)
 " OUTPATIENT PHYSICAL THERAPY Wound Treatment    Patient Name: Stacy Case MRN: [id number] DOB:19-Nov-1950, 74 y.o., female Today's Date: 11/21/2024   PCP: Jerel Sieving REFERRING PROVIDER: Tobie Buckles, DPM  END OF SESSION:  PT End of Session - 11/21/24 1215     Visit Number 2    Number of Visits 16    Date for Recertification  01/12/25    Authorization Type [employer] medicare    PT Start Time 1043    PT Stop Time 1215    PT Time Calculation (min) 92 min    Activity Tolerance Patient tolerated treatment well    Behavior During Therapy WFL for tasks assessed/performed           Past Medical History:  Diagnosis Date   Anxiety    Arthritis    Cellulitis of ankle    COPD (chronic obstructive pulmonary disease) (HCC)    Cystocele    Depression    GERD (gastroesophageal reflux disease)    Headache    hx of migraines   Hyperlipidemia    Hypertension    IBS (irritable bowel syndrome)    Peripheral arterial disease    legs   Pneumonia    Right inguinal hernia    Past Surgical History:  Procedure Laterality Date   ABDOMINAL AORTAGRAM N/A 11/20/2013   Procedure: ABDOMINAL EZELLA;  Surgeon: Lonni GORMAN Blade, MD;  [facility] CATH LAB;  Service: Cardiovascular;  Laterality: N/A;   AMPUTATION TOE Right 09/06/2024   Procedure: AMPUTATION, TOE;  Surgeon: Tobie Buckles, DPM;  Location: [facility];  Service: Podiatry;  Laterality: Right;   APPENDECTOMY     JOINT REPLACEMENT     Left hip 2018   TOTAL HIP ARTHROPLASTY Right 02/28/2020   Procedure: TOTAL HIP ARTHROPLASTY ANTERIOR APPROACH;  Surgeon: Melodi Lerner, MD;  Location: [facility];  Service: Orthopedics;  Laterality: Right;    WRIST SURGERY     Patient Active Problem List   Diagnosis Date Noted   Acute osteomyelitis of toe (HCC) 09/04/2024   Essential hypertension 09/04/2024   Mixed hyperlipidemia 09/04/2024   Chronic kidney disease, stage 3a (HCC) 09/04/2024   Lactic acidosis 09/04/2024    Osteomyelitis of great toe (HCC) 09/04/2024   Excoriation of multiple sites of lower extremity, left, initial encounter 05/14/2021   Venous stasis ulcer of left calf with fat layer exposed without varicose veins (HCC) 05/14/2021   Open wound of right great toe 03/05/2021   Age-related osteoporosis without current pathological fracture 10/17/2020   Gastroesophageal reflux disease without esophagitis 10/17/2020   Iron  deficiency anemia due to chronic blood loss 10/17/2020   Irritable bowel syndrome with diarrhea 10/17/2020   Moderate major depression (HCC) 10/17/2020   Hyperlipidemia 10/16/2020   Venous stasis ulcer of left ankle (HCC) 09/25/2020   Risk for falls 09/13/2020   ISTAP type 1 skin tear of right lower leg 08/21/2020   Lymphedema of both lower extremities 07/31/2020   Rectal bleeding 07/31/2020   Uterine prolapse 07/31/2020   History of total hip replacement, right 04/01/2020   OA (osteoarthritis) of hip 02/28/2020   Primary osteoarthritis of right hip 02/28/2020   Anemia 12/15/2019   Reducible right inguinal hernia 12/15/2019   S/P right inguinal hernia repair 12/15/2019   Postoperative Clostridium difficile infection 09/21/2017   Edema of both lower legs due to peripheral venous insufficiency 08/04/2017   Atherosclerotic peripheral vascular disease with ulceration (HCC) 11/15/2013    ONSET DATE: 09/06/24  REFERRING DIAG: T81.30XA (ICD-10-CM) - Wound dehiscence  I83.009,L97.909 (ICD-10-CM) - Venous stasis ulcer (HCC)  THERAPY DIAG:  T81.30XA (ICD-10-CM) - Wound dehiscence I83.009,L97.909 (ICD-10-CM) - Venous stasis ulcer (HCC) Right foot pain  Rationale for Evaluation and Treatment: Rehabilitation  HX   Wound Therapy - 11/21/24 0001     Subjective PT has no complaints.    Patient and Family Stated Goals wounds to heal    Prior Treatments multiple antibiotics, MD care and self care    Pain Scale 0-10    Pain Score 7     Pain Type Acute pain    Pain Location Toe  (Comment which one)    Pain Orientation Right    Pain Descriptors / Indicators Throbbing    Evaluation and Treatment Procedures Explained to Patient/Family Yes    Evaluation and Treatment Procedures agreed to    Wound Properties Date First Assessed: 11/17/24 Time First Assessed: 1130 Present on Original Admission: Yes Primary Wound Type: Surgical Secondary Wound Type - Surgical: Dehisced Location: Toe (Comment  which one) Location Orientation: Anterior;Right   Site / Wound Assessment Yellow    Peri-wound Assessment Edema;Erythema (blanchable)    Drainage Description Serous    Drainage Amount Moderate    Treatment Cleansed;Debridement (Selective);Hydrotherapy (Pulse lavage);Other (Comment)   packing with hydrofiber.   Dressing Type Silver hydrofiber    Dressing Changed Changed    Dressing Status Old drainage    % Wound base Red or Granulating 50%    % Wound base Yellow/Fibrinous Exudate 50%    Wound Properties Date First Assessed: 11/17/24 Time First Assessed: 1135 Present on Original Admission: Yes Primary Wound Type: Other (Comment) Location: Ankle Location Orientation: Anterior;Right   Drainage Amount None    Treatments Cleansed;Site care;Other (Comment)   debrided with scapel   Dressing Type Gauze (Comment)    Dressing Changed Changed    Dressing Status Dry    Wound Properties Date First Assessed: 11/17/24 Time First Assessed: 1145 Present on Original Admission: Yes Primary Wound Type: Infection Location: Pretibial Location Orientation: Distal;Left;Medial   Site / Wound Assessment Dusky    Peri-wound Assessment Erythema (blanchable)    Drainage Description Serous    Drainage Amount Small    Dressing Type Gauze (Comment)    Dressing Changed Changed    Dressing Status Old drainage    Pulsed lavage therapy - wound location toe    Pulsed Lavage with Suction (psi) 400 psi    Pulsed Lavage with Suction - Normal Saline Used 1000 mL    Pulsed Lavage Tip Tip with splash shield     Selective Debridement (non-excisional) - Location B Legs, Rt great and second toe, Rt foot    Selective Debridement (non-excisional) - Tools Used Forceps;Scissors    Selective Debridement (non-excisional) - Tissue Removed slough and devitalized tissue    Wound Therapy - Clinical Statement see below    Factors Delaying/Impairing Wound Healing Infection - systemic/local;Immobility;Multiple medical problems;Vascular compromise    Hydrotherapy Plan Debridement;Dressing change;Patient/family education;Pulsatile lavage with suction;Other (comment)    Wound Therapy - Frequency 2X / week   for 8 weeks   Wound Therapy - Current Recommendations PT    Wound Plan PL, debridement and dressing change    Dressing  silverhydrofiber, 4x4 and kling to Rt great toe    Dressing Rt foot, ankle and 2nd toe medihoney, 4x4 , kling and netting    Dressing Lt leg wound silverhydrofiber, medial and lateral foot wounds medihoney 4x4 and kling followed by netting          74 year old  female with a history of GERD, hypertension, hyperlipidemia venous insufficiency, chronic pain syndrome with opioid dependence, iron  deficiency anemia presenting with increasing pain and swelling in the right foot and great toe.  Patient has undergone partial amputation of the right great toe on 10/22 with podiatry.    PATIENT EDUCATION: Education details: complete toe crunches/toe extension and ankle pump Person educated: Patient Education method: Explanation Education comprehension: verbalized understanding   HOME EXERCISE PROGRAM: ankle pumps.    GOALS: Goals reviewed with patient? No  SHORT TERM GOALS: Target date: 12/15/24  Pt Rt great toe to no longer have drainage coming out from it Baseline: Goal status: IN PROGRESS  2.  PT Rt great toe wound to no longer have any undermining Baseline:  Goal status: IN PROGRESS  3.  PT pain to be decreased to no greater than a 4/10 Baseline:  Goal status: IN PROGRESS  LONG TERM  GOALS: Target date: 01/12/25  PT wound on her Rt great toe to be healed  Baseline:  Goal status: IN PROGRESS  2.  PT wound on her Rt ankle to be healed.  Baseline:  Goal status: IN PROGRESS  3.  Pt wound on her Lt ankle to be healed  Baseline:  Goal status: IN PROGRESS  4.  PT to have no pain in her feet.  Baseline:  Goal status: IN PROGRESS   ASSESSMENT:  CLINICAL IMPRESSION: Therapist spent over 60 minutes debriding devitalized and dead skin from both pt RT and Lt LE.  Pt has raw areas on B LE beneath B malleolus which was treated with medihoney and 4x4 following debridement.  Pt has filth between toes which therapist debrided out and then cleansed.  Debriding devitalized tissue on Rt dorsal foot revealed multiple small wounds which were treated with medihoney 4x4 and kling.  PT toe no longer has purulent drainage therefore pulse lavage may be discontinued at this time.  Continue with cleansing, moisturizing and debridement to B LE and feet.   OBJECTIVE IMPAIRMENTS: cardiopulmonary status limiting activity, decreased activity tolerance, decreased endurance, difficulty walking, decreased ROM, pain, and infection and decreased skin integrity.   ACTIVITY LIMITATIONS: locomotion level  PARTICIPATION LIMITATIONS: meal prep and cleaning  PERSONAL FACTORS: Age, Fitness, Past/current experiences, and Time since onset of injury/illness/exacerbation are also affecting patient's functional outcome.   REHAB POTENTIAL: Good  CLINICAL DECISION MAKING: Unstable/unpredictable  EVALUATION COMPLEXITY: Moderate  PLAN: PT FREQUENCY: 2x/week  PT DURATION: 8 weeks  PLANNED INTERVENTIONS: 97110-Therapeutic exercises, 97535- Self Care, 02859- Manual therapy, 97597- Wound care (first 20 sq cm), and Patient/Family education  PLAN FOR NEXT SESSION: discontinue PL>  Continue with cleansing,  Debride and dress as wound bed warrants.  Montie Metro, PT CLT [phone]  11/21/2024, 12:22  PM        "

## 2024-11-23 ENCOUNTER — Ambulatory Visit (HOSPITAL_COMMUNITY): Admitting: Physical Therapy

## 2024-11-23 DIAGNOSIS — S91201D Unspecified open wound of right great toe with damage to nail, subsequent encounter: Secondary | ICD-10-CM

## 2024-11-23 DIAGNOSIS — M79671 Pain in right foot: Secondary | ICD-10-CM

## 2024-11-23 DIAGNOSIS — S91002S Unspecified open wound, left ankle, sequela: Secondary | ICD-10-CM

## 2024-11-23 DIAGNOSIS — S91001S Unspecified open wound, right ankle, sequela: Secondary | ICD-10-CM | POA: Diagnosis not present

## 2024-11-23 DIAGNOSIS — M25572 Pain in left ankle and joints of left foot: Secondary | ICD-10-CM

## 2024-11-23 NOTE — Therapy (Signed)
 " OUTPATIENT PHYSICAL THERAPY Wound Treatment    Patient Name: Stacy Case MRN: [id number] DOB:February 18, 1951, 74 y.o., female Today's Date: 11/23/2024   PCP: Jerel Sieving REFERRING PROVIDER: Tobie Buckles, DPM  END OF SESSION:  PT End of Session - 11/23/24 1215     Visit Number 3    Number of Visits 16    Date for Recertification  01/12/25    Authorization Type [employer] medicare    PT Start Time 1110    PT Stop Time 1202    PT Time Calculation (min) 52 min    Activity Tolerance Patient tolerated treatment well    Behavior During Therapy WFL for tasks assessed/performed           Past Medical History:  Diagnosis Date   Anxiety    Arthritis    Cellulitis of ankle    COPD (chronic obstructive pulmonary disease) (HCC)    Cystocele    Depression    GERD (gastroesophageal reflux disease)    Headache    hx of migraines   Hyperlipidemia    Hypertension    IBS (irritable bowel syndrome)    Peripheral arterial disease    legs   Pneumonia    Right inguinal hernia    Past Surgical History:  Procedure Laterality Date   ABDOMINAL AORTAGRAM N/A 11/20/2013   Procedure: ABDOMINAL EZELLA;  Surgeon: Lonni GORMAN Blade, MD;  Location: [facility] CATH LAB;  Service: Cardiovascular;  Laterality: N/A;   AMPUTATION TOE Right 09/06/2024   Procedure: AMPUTATION, TOE;  Surgeon: Tobie Buckles, DPM;  Location: [facility];  Service: Podiatry;  Laterality: Right;   APPENDECTOMY     JOINT REPLACEMENT     Left hip 2018   TOTAL HIP ARTHROPLASTY Right 02/28/2020   Procedure: TOTAL HIP ARTHROPLASTY ANTERIOR APPROACH;  Surgeon: Melodi Lerner, MD;  Location: [facility];  Service: Orthopedics;  Laterality: Right;    WRIST SURGERY     Patient Active Problem List   Diagnosis Date Noted   Acute osteomyelitis of toe (HCC) 09/04/2024   Essential hypertension 09/04/2024   Mixed hyperlipidemia 09/04/2024   Chronic kidney disease, stage 3a (HCC) 09/04/2024   Lactic acidosis 09/04/2024    Osteomyelitis of great toe (HCC) 09/04/2024   Excoriation of multiple sites of lower extremity, left, initial encounter 05/14/2021   Venous stasis ulcer of left calf with fat layer exposed without varicose veins (HCC) 05/14/2021   Open wound of right great toe 03/05/2021   Age-related osteoporosis without current pathological fracture 10/17/2020   Gastroesophageal reflux disease without esophagitis 10/17/2020   Iron  deficiency anemia due to chronic blood loss 10/17/2020   Irritable bowel syndrome with diarrhea 10/17/2020   Moderate major depression (HCC) 10/17/2020   Hyperlipidemia 10/16/2020   Venous stasis ulcer of left ankle (HCC) 09/25/2020   Risk for falls 09/13/2020   ISTAP type 1 skin tear of right lower leg 08/21/2020   Lymphedema of both lower extremities 07/31/2020   Rectal bleeding 07/31/2020   Uterine prolapse 07/31/2020   History of total hip replacement, right 04/01/2020   OA (osteoarthritis) of hip 02/28/2020   Primary osteoarthritis of right hip 02/28/2020   Anemia 12/15/2019   Reducible right inguinal hernia 12/15/2019   S/P right inguinal hernia repair 12/15/2019   Postoperative Clostridium difficile infection 09/21/2017   Edema of both lower legs due to peripheral venous insufficiency 08/04/2017   Atherosclerotic peripheral vascular disease with ulceration (HCC) 11/15/2013    ONSET DATE: 09/06/24  REFERRING DIAG: T81.30XA (ICD-10-CM) - Wound dehiscence  I83.009,L97.909 (ICD-10-CM) - Venous stasis ulcer (HCC)  THERAPY DIAG:  T81.30XA (ICD-10-CM) - Wound dehiscence I83.009,L97.909 (ICD-10-CM) - Venous stasis ulcer (HCC) Right foot pain  Rationale for Evaluation and Treatment: Rehabilitation  HX   Wound Therapy - 11/23/24 0001     Subjective PT states that the right dressing has been painful.    Patient and Family Stated Goals wounds to heal    Prior Treatments multiple antibiotics, MD care and self care    Pain Score 5     Pain Type Acute pain    Pain  Location Toe (Comment which one)    Pain Orientation Right    Evaluation and Treatment Procedures Explained to Patient/Family Yes    Evaluation and Treatment Procedures agreed to    Wound Properties Date First Assessed: 11/17/24 Time First Assessed: 1130 Present on Original Admission: Yes Primary Wound Type: Surgical Secondary Wound Type - Surgical: Dehisced Location: Toe (Comment  which one) Location Orientation: Anterior;Right   Site / Wound Assessment Yellow    Peri-wound Assessment Edema;Erythema (blanchable)    Wound Length (cm) 0.7 cm    Wound Width (cm) 0.2 cm    Wound Surface Area (cm^2) 0.11 cm^2    Drainage Description Serous    Drainage Amount Small    Treatment Cleansed;Debridement (Selective)    Dressing Type Silver hydrofiber    Dressing Changed Changed    Dressing Status Old drainage    % Wound base Red or Granulating 70%    % Wound base Yellow/Fibrinous Exudate 30%    Undermining (cm) decreased    Wound Properties Date First Assessed: 11/17/24 Time First Assessed: 1135 Present on Original Admission: Yes Primary Wound Type: Other (Comment) Location: Ankle Location Orientation: Anterior;Right   Wound Length (cm) 0.5 cm    Wound Width (cm) 0.6 cm    Wound Surface Area (cm^2) 0.24 cm^2    Drainage Amount None    Treatments Cleansed;Moisturizing cream;Site care   bladed  as wound bed has adherent eschar.   Dressing Type Gauze (Comment)    Dressing Changed Changed    Dressing Status Dry    Wound Properties Date First Assessed: 11/17/24 Time First Assessed: 1145 Present on Original Admission: Yes Primary Wound Type: Infection Location: Pretibial Location Orientation: Distal;Left;Medial   Site / Wound Assessment Dusky    Peri-wound Assessment Erythema (blanchable)    Wound Length (cm) --   was one wound now 2; sup 1.3 x .7x.2; inferior 1.2 x 1.0 x .1   Drainage Description Serous    Drainage Amount Small    Dressing Type Gauze (Comment)    Dressing Changed Changed     Dressing Status Old drainage    Selective Debridement (non-excisional) - Location B Legs, Rt great and second toe, Rt foot    Selective Debridement (non-excisional) - Tools Used Forceps;Scissors    Selective Debridement (non-excisional) - Tissue Removed slough and devitalized tissue    Wound Therapy - Clinical Statement see below    Factors Delaying/Impairing Wound Healing Infection - systemic/local;Immobility;Multiple medical problems;Vascular compromise    Hydrotherapy Plan Debridement;Dressing change;Patient/family education;Pulsatile lavage with suction;Other (comment)    Wound Therapy - Frequency 2X / week   for 8 weeks   Wound Therapy - Current Recommendations PT    Wound Plan PL, debridement and dressing change    Dressing  silverhydrofiber, 4x4 and kling to Rt great toe, profore lite to LE    Dressing Rt foot, ankle and 2nd toe medihoney, 4x4 , kling and netting  Dressing LT: silverhydrofiber to wound followed by profore lite to LE          74 year old female with a history of GERD, hypertension, hyperlipidemia venous insufficiency, chronic pain syndrome with opioid dependence, iron  deficiency anemia presenting with increasing pain and swelling in the right foot and great toe.  Patient has undergone partial amputation of the right great toe on 10/22 with podiatry.    PATIENT EDUCATION: Education details: complete toe crunches/toe extension and ankle pump Person educated: Patient Education method: Explanation Education comprehension: verbalized understanding   HOME EXERCISE PROGRAM: ankle pumps.    GOALS: Goals reviewed with patient? No  SHORT TERM GOALS: Target date: 12/15/24  Pt Rt great toe to no longer have drainage coming out from it Baseline: Goal status: IN PROGRESS  2.  PT Rt great toe wound to no longer have any undermining Baseline:  Goal status: IN PROGRESS  3.  PT pain to be decreased to no greater than a 4/10 Baseline:  Goal status: IN  PROGRESS  LONG TERM GOALS: Target date: 01/12/25  PT wound on her Rt great toe to be healed  Baseline:  Goal status: IN PROGRESS  2.  PT wound on her Rt ankle to be healed.  Baseline:  Goal status: IN PROGRESS  3.  Pt wound on her Lt ankle to be healed  Baseline:  Goal status: IN PROGRESS  4.  PT to have no pain in her feet.  Baseline:  Goal status: IN PROGRESS   ASSESSMENT:  CLINICAL IMPRESSION: Pt with multiple small wounds on dorsal aspect of Rt forefoot where therapist had debrided scabs last session.  Therapist continued with Debriding devitalized tissue and slough.    PT toe no longer has purulent drainage but has slough that is easily removed. Therapist changed dressing to profore lite B due to noted induration occurring in LE's.    Continue with cleansing, moisturizing and debridement to B LE and feet.   OBJECTIVE IMPAIRMENTS: cardiopulmonary status limiting activity, decreased activity tolerance, decreased endurance, difficulty walking, decreased ROM, pain, and infection and decreased skin integrity.   ACTIVITY LIMITATIONS: locomotion level  PARTICIPATION LIMITATIONS: meal prep and cleaning  PERSONAL FACTORS: Age, Fitness, Past/current experiences, and Time since onset of injury/illness/exacerbation are also affecting patient's functional outcome.   REHAB POTENTIAL: Good  CLINICAL DECISION MAKING: Unstable/unpredictable  EVALUATION COMPLEXITY: Moderate  PLAN: PT FREQUENCY: 2x/week  PT DURATION: 8 weeks  PLANNED INTERVENTIONS: 97110-Therapeutic exercises, 97535- Self Care, [id number]- Manual therapy, 97597- Wound care (first 20 sq cm), and Patient/Family education  PLAN FOR NEXT SESSION: discontinue PL>  Continue with cleansing,  Debride and dress as wound bed warrants.  Montie Metro, PT CLT [phone]  11/23/2024, 12:23 PM        "

## 2024-11-28 ENCOUNTER — Ambulatory Visit (HOSPITAL_COMMUNITY): Admitting: Physical Therapy

## 2024-11-30 ENCOUNTER — Ambulatory Visit (HOSPITAL_COMMUNITY): Admitting: Physical Therapy

## 2024-12-05 ENCOUNTER — Ambulatory Visit (HOSPITAL_COMMUNITY): Admitting: Physical Therapy

## 2024-12-05 ENCOUNTER — Encounter (HOSPITAL_BASED_OUTPATIENT_CLINIC_OR_DEPARTMENT_OTHER): Admitting: Internal Medicine

## 2024-12-05 ENCOUNTER — Encounter (HOSPITAL_COMMUNITY): Payer: Self-pay | Admitting: Physical Therapy

## 2024-12-05 NOTE — Therapy (Signed)
 PHYSICAL THERAPY DISCHARGE SUMMARY  Visits from Start of Care: 3  Current functional level related to goals / functional outcomes: Wound no longer having purulent drainage   Remaining deficits: Open wound B    Education / Equipment: The need to keep wounds covered and moist    Patient agrees to discharge. Patient goals were partially met. Patient is being discharged due to PT is receiving [facility] services.   Montie Metro, PT CLT [phone]

## 2024-12-07 ENCOUNTER — Ambulatory Visit (HOSPITAL_COMMUNITY): Admitting: Physical Therapy

## 2024-12-12 ENCOUNTER — Ambulatory Visit (HOSPITAL_COMMUNITY): Admitting: Physical Therapy

## 2024-12-14 ENCOUNTER — Ambulatory Visit (HOSPITAL_COMMUNITY): Admitting: Physical Therapy
